# Patient Record
Sex: Male | Born: 1935 | Race: White | Hispanic: No | Marital: Married | Smoking: Current every day smoker
Health system: Southern US, Community
[De-identification: ages and names within clinical notes are randomized; demographics above are authoritative.]

## PROBLEM LIST (undated history)

## (undated) ENCOUNTER — Inpatient Hospital Stay: Admission: EM | Payer: Self-pay | Source: Home / Self Care

## (undated) DIAGNOSIS — E785 Hyperlipidemia, unspecified: Secondary | ICD-10-CM

## (undated) DIAGNOSIS — Z930 Tracheostomy status: Secondary | ICD-10-CM

## (undated) DIAGNOSIS — R0602 Shortness of breath: Secondary | ICD-10-CM

## (undated) DIAGNOSIS — I509 Heart failure, unspecified: Secondary | ICD-10-CM

## (undated) DIAGNOSIS — C799 Secondary malignant neoplasm of unspecified site: Secondary | ICD-10-CM

## (undated) DIAGNOSIS — J189 Pneumonia, unspecified organism: Secondary | ICD-10-CM

## (undated) DIAGNOSIS — G4700 Insomnia, unspecified: Secondary | ICD-10-CM

## (undated) DIAGNOSIS — K259 Gastric ulcer, unspecified as acute or chronic, without hemorrhage or perforation: Secondary | ICD-10-CM

## (undated) DIAGNOSIS — N529 Male erectile dysfunction, unspecified: Secondary | ICD-10-CM

## (undated) DIAGNOSIS — C3490 Malignant neoplasm of unspecified part of unspecified bronchus or lung: Secondary | ICD-10-CM

## (undated) DIAGNOSIS — N400 Enlarged prostate without lower urinary tract symptoms: Secondary | ICD-10-CM

## (undated) DIAGNOSIS — R3915 Urgency of urination: Secondary | ICD-10-CM

## (undated) DIAGNOSIS — Z9289 Personal history of other medical treatment: Secondary | ICD-10-CM

## (undated) DIAGNOSIS — C801 Malignant (primary) neoplasm, unspecified: Secondary | ICD-10-CM

## (undated) DIAGNOSIS — K429 Umbilical hernia without obstruction or gangrene: Secondary | ICD-10-CM

## (undated) DIAGNOSIS — C7951 Secondary malignant neoplasm of bone: Secondary | ICD-10-CM

## (undated) DIAGNOSIS — I499 Cardiac arrhythmia, unspecified: Secondary | ICD-10-CM

## (undated) HISTORY — DX: Secondary malignant neoplasm of bone: C79.51

## (undated) HISTORY — PX: COLONOSCOPY: SHX174

## (undated) HISTORY — DX: Male erectile dysfunction, unspecified: N52.9

## (undated) HISTORY — DX: Secondary malignant neoplasm of unspecified site: C79.9

## (undated) HISTORY — DX: Insomnia, unspecified: G47.00

## (undated) HISTORY — DX: Hyperlipidemia, unspecified: E78.5

## (undated) HISTORY — DX: Heart failure, unspecified: I50.9

## (undated) HISTORY — PX: ESOPHAGOGASTRODUODENOSCOPY: SHX1529

## (undated) HISTORY — PX: OTHER SURGICAL HISTORY: SHX169

## (undated) HISTORY — PX: BACK SURGERY: SHX140

## (undated) HISTORY — DX: Malignant (primary) neoplasm, unspecified: C80.1

## (undated) HISTORY — DX: Pneumonia, unspecified organism: J18.9

## (undated) HISTORY — DX: Malignant neoplasm of unspecified part of unspecified bronchus or lung: C34.90

---

## 2006-07-06 ENCOUNTER — Emergency Department (HOSPITAL_COMMUNITY): Admission: EM | Admit: 2006-07-06 | Discharge: 2006-07-06 | Payer: Self-pay | Admitting: Emergency Medicine

## 2008-01-18 DIAGNOSIS — C3490 Malignant neoplasm of unspecified part of unspecified bronchus or lung: Secondary | ICD-10-CM

## 2008-01-18 DIAGNOSIS — C801 Malignant (primary) neoplasm, unspecified: Secondary | ICD-10-CM

## 2008-01-18 HISTORY — DX: Malignant (primary) neoplasm, unspecified: C80.1

## 2008-01-18 HISTORY — PX: TRACHEOSTOMY: SUR1362

## 2008-01-18 HISTORY — PX: LUNG REMOVAL, PARTIAL: SHX233

## 2008-01-18 HISTORY — DX: Malignant neoplasm of unspecified part of unspecified bronchus or lung: C34.90

## 2008-02-20 ENCOUNTER — Ambulatory Visit (HOSPITAL_COMMUNITY): Admission: RE | Admit: 2008-02-20 | Discharge: 2008-02-20 | Payer: Self-pay | Admitting: Family Medicine

## 2008-02-21 ENCOUNTER — Ambulatory Visit (HOSPITAL_COMMUNITY): Admission: RE | Admit: 2008-02-21 | Discharge: 2008-02-21 | Payer: Self-pay | Admitting: Family Medicine

## 2008-02-28 ENCOUNTER — Ambulatory Visit: Payer: Self-pay | Admitting: Cardiothoracic Surgery

## 2008-02-28 ENCOUNTER — Ambulatory Visit (HOSPITAL_COMMUNITY)
Admission: RE | Admit: 2008-02-28 | Discharge: 2008-02-28 | Payer: Self-pay | Admitting: Thoracic Surgery (Cardiothoracic Vascular Surgery)

## 2008-02-28 ENCOUNTER — Ambulatory Visit: Payer: Self-pay | Admitting: Internal Medicine

## 2008-02-28 DIAGNOSIS — H9209 Otalgia, unspecified ear: Secondary | ICD-10-CM | POA: Insufficient documentation

## 2008-02-28 DIAGNOSIS — M542 Cervicalgia: Secondary | ICD-10-CM | POA: Insufficient documentation

## 2008-02-28 DIAGNOSIS — R0982 Postnasal drip: Secondary | ICD-10-CM | POA: Insufficient documentation

## 2008-02-28 DIAGNOSIS — R069 Unspecified abnormalities of breathing: Secondary | ICD-10-CM | POA: Insufficient documentation

## 2008-02-28 DIAGNOSIS — F17200 Nicotine dependence, unspecified, uncomplicated: Secondary | ICD-10-CM | POA: Insufficient documentation

## 2008-02-28 DIAGNOSIS — J449 Chronic obstructive pulmonary disease, unspecified: Secondary | ICD-10-CM

## 2008-02-28 DIAGNOSIS — J4489 Other specified chronic obstructive pulmonary disease: Secondary | ICD-10-CM | POA: Insufficient documentation

## 2008-03-04 ENCOUNTER — Ambulatory Visit (HOSPITAL_COMMUNITY): Admission: RE | Admit: 2008-03-04 | Discharge: 2008-03-04 | Payer: Self-pay | Admitting: Cardiothoracic Surgery

## 2008-03-07 ENCOUNTER — Ambulatory Visit: Payer: Self-pay | Admitting: Cardiothoracic Surgery

## 2008-03-17 ENCOUNTER — Encounter (INDEPENDENT_AMBULATORY_CARE_PROVIDER_SITE_OTHER): Payer: Self-pay | Admitting: Otolaryngology

## 2008-03-17 ENCOUNTER — Inpatient Hospital Stay (HOSPITAL_COMMUNITY): Admission: RE | Admit: 2008-03-17 | Discharge: 2008-03-25 | Payer: Self-pay | Admitting: Cardiothoracic Surgery

## 2008-03-17 ENCOUNTER — Ambulatory Visit: Payer: Self-pay | Admitting: Cardiothoracic Surgery

## 2008-03-27 ENCOUNTER — Ambulatory Visit: Admission: RE | Admit: 2008-03-27 | Discharge: 2008-05-07 | Payer: Self-pay | Admitting: Radiation Oncology

## 2008-04-04 ENCOUNTER — Encounter: Admission: RE | Admit: 2008-04-04 | Discharge: 2008-04-04 | Payer: Self-pay | Admitting: Cardiothoracic Surgery

## 2008-04-04 ENCOUNTER — Ambulatory Visit: Payer: Self-pay | Admitting: Cardiothoracic Surgery

## 2008-05-06 ENCOUNTER — Inpatient Hospital Stay (HOSPITAL_COMMUNITY): Admission: RE | Admit: 2008-05-06 | Discharge: 2008-05-09 | Payer: Self-pay | Admitting: Otolaryngology

## 2008-05-06 ENCOUNTER — Encounter (INDEPENDENT_AMBULATORY_CARE_PROVIDER_SITE_OTHER): Payer: Self-pay | Admitting: Otolaryngology

## 2008-05-15 ENCOUNTER — Ambulatory Visit: Admission: RE | Admit: 2008-05-15 | Discharge: 2008-07-17 | Payer: Self-pay | Admitting: Radiation Oncology

## 2008-10-10 ENCOUNTER — Encounter: Admission: RE | Admit: 2008-10-10 | Discharge: 2008-10-10 | Payer: Self-pay | Admitting: Cardiothoracic Surgery

## 2008-10-10 ENCOUNTER — Ambulatory Visit: Payer: Self-pay | Admitting: Cardiothoracic Surgery

## 2008-12-18 ENCOUNTER — Ambulatory Visit (HOSPITAL_COMMUNITY): Admission: RE | Admit: 2008-12-18 | Discharge: 2008-12-18 | Payer: Self-pay | Admitting: Ophthalmology

## 2009-03-18 ENCOUNTER — Ambulatory Visit (HOSPITAL_COMMUNITY): Admission: RE | Admit: 2009-03-18 | Discharge: 2009-03-18 | Payer: Self-pay | Admitting: Family Medicine

## 2009-10-20 ENCOUNTER — Ambulatory Visit (HOSPITAL_COMMUNITY)
Admission: RE | Admit: 2009-10-20 | Discharge: 2009-10-20 | Payer: Self-pay | Source: Home / Self Care | Admitting: Radiation Oncology

## 2009-11-04 ENCOUNTER — Encounter (INDEPENDENT_AMBULATORY_CARE_PROVIDER_SITE_OTHER): Payer: Self-pay | Admitting: Neurosurgery

## 2009-11-04 ENCOUNTER — Inpatient Hospital Stay (HOSPITAL_COMMUNITY)
Admission: RE | Admit: 2009-11-04 | Discharge: 2009-11-05 | Payer: Self-pay | Source: Home / Self Care | Admitting: Neurosurgery

## 2010-01-28 ENCOUNTER — Encounter (INDEPENDENT_AMBULATORY_CARE_PROVIDER_SITE_OTHER): Payer: Self-pay

## 2010-02-18 NOTE — Letter (Signed)
Summary: Recall, Screening Colonoscopy Only  [facility]  [address]   [place], [place] [address]   Phone: [phone]  Fax: [phone]    January 28, 2010  CHRISTINA WALDROP [address] [place], [place]  [address] 1933-05-27   Dear Mr. Childress,   Our records indicate it is time to schedule your colonoscopy.    Please call our office at [phone] and ask for the nurse.   Thank you,  Hendricks Limes, LPN Cloria Spring, LPN  [facility] Ph: [phone]   Fax: [phone]

## 2010-03-31 LAB — CBC
MCH: 30.7 pg (ref 26.0–34.0)
MCV: 91.2 fL (ref 78.0–100.0)
Platelets: 203 10*3/uL (ref 150–400)
RDW: 14.6 % (ref 11.5–15.5)

## 2010-04-21 LAB — BASIC METABOLIC PANEL
BUN: 13 mg/dL (ref 6–23)
CO2: 33 mEq/L — ABNORMAL HIGH (ref 19–32)
Chloride: 103 mEq/L (ref 96–112)
Creatinine, Ser: 0.87 mg/dL (ref 0.4–1.5)

## 2010-04-28 LAB — COMPREHENSIVE METABOLIC PANEL
AST: 14 U/L (ref 0–37)
Albumin: 3.9 g/dL (ref 3.5–5.2)
Chloride: 105 mEq/L (ref 96–112)
Creatinine, Ser: 0.85 mg/dL (ref 0.4–1.5)
GFR calc Af Amer: >60 mL/min (ref >60)
Potassium: 4.4 mEq/L (ref 3.5–5.1)
Total Bilirubin: 0.4 mg/dL (ref 0.3–1.2)
Total Protein: 6.7 g/dL (ref 6.0–8.3)

## 2010-04-28 LAB — URINALYSIS, ROUTINE W REFLEX MICROSCOPIC
Bilirubin Urine: NEGATIVE
Nitrite: NEGATIVE
Specific Gravity, Urine: 1.013 (ref 1.005–1.030)
pH: 6.5 (ref 5.0–8.0)

## 2010-04-28 LAB — GLUCOSE, CAPILLARY: Glucose-Capillary: 107 mg/dL — ABNORMAL HIGH (ref 70–99)

## 2010-04-28 LAB — CBC
MCV: 90.9 fL (ref 78.0–100.0)
Platelets: 222 10*3/uL (ref 150–400)
WBC: 4.2 10*3/uL (ref 4.0–10.5)

## 2010-04-28 LAB — APTT: aPTT: 33 seconds (ref 24–37)

## 2010-04-28 LAB — TYPE AND SCREEN

## 2010-04-29 LAB — BASIC METABOLIC PANEL
BUN: 14 mg/dL (ref 6–23)
BUN: 23 mg/dL (ref 6–23)
BUN: 9 mg/dL (ref 6–23)
CO2: 28 mEq/L (ref 19–32)
CO2: 28 mEq/L (ref 19–32)
CO2: 29 mEq/L (ref 19–32)
Calcium: 7.8 mg/dL — ABNORMAL LOW (ref 8.4–10.5)
Calcium: 8.3 mg/dL — ABNORMAL LOW (ref 8.4–10.5)
Calcium: 8.8 mg/dL (ref 8.4–10.5)
Chloride: 101 mEq/L (ref 96–112)
Chloride: 91 mEq/L — ABNORMAL LOW (ref 96–112)
Chloride: 96 mEq/L (ref 96–112)
Creatinine, Ser: 0.74 mg/dL (ref 0.4–1.5)
Creatinine, Ser: 0.82 mg/dL (ref 0.4–1.5)
Creatinine, Ser: 0.83 mg/dL (ref 0.4–1.5)
GFR calc Af Amer: >60 mL/min (ref >60)
GFR calc Af Amer: >60 mL/min (ref >60)
GFR calc Af Amer: >60 mL/min (ref >60)
GFR calc non Af Amer: >60 mL/min (ref >60)
GFR calc non Af Amer: >60 mL/min (ref >60)
GFR calc non Af Amer: >60 mL/min (ref >60)
Glucose, Bld: 115 mg/dL — ABNORMAL HIGH (ref 70–99)
Glucose, Bld: 121 mg/dL — ABNORMAL HIGH (ref 70–99)
Glucose, Bld: 136 mg/dL — ABNORMAL HIGH (ref 70–99)
Potassium: 4.3 mEq/L (ref 3.5–5.1)
Potassium: 4.3 mEq/L (ref 3.5–5.1)
Potassium: 4.5 mEq/L (ref 3.5–5.1)
Sodium: 130 mEq/L — ABNORMAL LOW (ref 135–145)
Sodium: 133 mEq/L — ABNORMAL LOW (ref 135–145)
Sodium: 134 mEq/L — ABNORMAL LOW (ref 135–145)

## 2010-04-29 LAB — COMPREHENSIVE METABOLIC PANEL
ALT: 12 U/L (ref 0–53)
AST: 20 U/L (ref 0–37)
Albumin: 2.7 g/dL — ABNORMAL LOW (ref 3.5–5.2)
Alkaline Phosphatase: 40 U/L (ref 39–117)
BUN: 9 mg/dL (ref 6–23)
CO2: 28 mEq/L (ref 19–32)
Calcium: 8 mg/dL — ABNORMAL LOW (ref 8.4–10.5)
Chloride: 94 mEq/L — ABNORMAL LOW (ref 96–112)
Creatinine, Ser: 0.72 mg/dL (ref 0.4–1.5)
GFR calc Af Amer: >60 mL/min (ref >60)
GFR calc non Af Amer: >60 mL/min (ref >60)
Glucose, Bld: 126 mg/dL — ABNORMAL HIGH (ref 70–99)
Potassium: 4.5 mEq/L (ref 3.5–5.1)
Sodium: 128 mEq/L — ABNORMAL LOW (ref 135–145)
Total Bilirubin: 0.6 mg/dL (ref 0.3–1.2)
Total Protein: 5.4 g/dL — ABNORMAL LOW (ref 6.0–8.3)

## 2010-04-29 LAB — POCT I-STAT, CHEM 8
BUN: 12 mg/dL (ref 6–23)
Calcium, Ion: 1.19 mmol/L (ref 1.12–1.32)
Chloride: 103 mEq/L (ref 96–112)
Creatinine, Ser: 0.8 mg/dL (ref 0.4–1.5)
Glucose, Bld: 140 mg/dL — ABNORMAL HIGH (ref 70–99)
HCT: 36 % — ABNORMAL LOW (ref 39.0–52.0)
Hemoglobin: 12.2 g/dL — ABNORMAL LOW (ref 13.0–17.0)
Potassium: 4.4 mEq/L (ref 3.5–5.1)
Sodium: 138 mEq/L (ref 135–145)
TCO2: 26 mmol/L (ref 0–100)

## 2010-04-29 LAB — CBC
HCT: 31 % — ABNORMAL LOW (ref 39.0–52.0)
HCT: 33.1 % — ABNORMAL LOW (ref 39.0–52.0)
Hemoglobin: 10.9 g/dL — ABNORMAL LOW (ref 13.0–17.0)
Hemoglobin: 11.8 g/dL — ABNORMAL LOW (ref 13.0–17.0)
MCHC: 35.2 g/dL (ref 30.0–36.0)
MCHC: 35.6 g/dL (ref 30.0–36.0)
MCV: 91.6 fL (ref 78.0–100.0)
MCV: 92.8 fL (ref 78.0–100.0)
Platelets: 170 10*3/uL (ref 150–400)
Platelets: 173 10*3/uL (ref 150–400)
RBC: 3.34 MIL/uL — ABNORMAL LOW (ref 4.22–5.81)
RBC: 3.61 MIL/uL — ABNORMAL LOW (ref 4.22–5.81)
RDW: 13.8 % (ref 11.5–15.5)
RDW: 13.8 % (ref 11.5–15.5)
WBC: 6 10*3/uL (ref 4.0–10.5)
WBC: 9 10*3/uL (ref 4.0–10.5)

## 2010-04-29 LAB — POCT I-STAT 3, ART BLOOD GAS (G3+)
Acid-Base Excess: 1 mmol/L (ref 0.0–2.0)
Acid-Base Excess: 3 mmol/L — ABNORMAL HIGH (ref 0.0–2.0)
Bicarbonate: 27.2 mEq/L — ABNORMAL HIGH (ref 20.0–24.0)
Bicarbonate: 28.6 mEq/L — ABNORMAL HIGH (ref 20.0–24.0)
O2 Saturation: 95 %
O2 Saturation: 97 %
Patient temperature: 97.9
Patient temperature: 98.7
TCO2: 29 mmol/L (ref 0–100)
TCO2: 30 mmol/L (ref 0–100)
pCO2 arterial: 46.9 mmHg — ABNORMAL HIGH (ref 35.0–45.0)
pCO2 arterial: 48.7 mmHg — ABNORMAL HIGH (ref 35.0–45.0)
pH, Arterial: 7.353 (ref 7.350–7.450)
pH, Arterial: 7.393 (ref 7.350–7.450)
pO2, Arterial: 78 mmHg — ABNORMAL LOW (ref 80.0–100.0)
pO2, Arterial: 94 mmHg (ref 80.0–100.0)

## 2010-04-29 LAB — BLOOD GAS, ARTERIAL
Acid-base deficit: 1.3 mmol/L (ref 0.0–2.0)
Bicarbonate: 24.5 mEq/L — ABNORMAL HIGH (ref 20.0–24.0)
O2 Content: 4 L/min
O2 Saturation: 94.4 %
Patient temperature: 98.6
TCO2: 26.1 mmol/L (ref 0–100)
pCO2 arterial: 53.3 mmHg — ABNORMAL HIGH (ref 35.0–45.0)
pH, Arterial: 7.285 — ABNORMAL LOW (ref 7.350–7.450)
pO2, Arterial: 77.6 mmHg — ABNORMAL LOW (ref 80.0–100.0)

## 2010-04-29 LAB — PREPARE PLATELETS

## 2010-04-29 LAB — GLUCOSE, CAPILLARY
Glucose-Capillary: 110 mg/dL — ABNORMAL HIGH (ref 70–99)
Glucose-Capillary: 112 mg/dL — ABNORMAL HIGH (ref 70–99)
Glucose-Capillary: 115 mg/dL — ABNORMAL HIGH (ref 70–99)

## 2010-05-04 LAB — URINALYSIS, ROUTINE W REFLEX MICROSCOPIC
Bilirubin Urine: NEGATIVE
Glucose, UA: NEGATIVE mg/dL
Hgb urine dipstick: NEGATIVE
Ketones, ur: NEGATIVE mg/dL
Nitrite: NEGATIVE
Protein, ur: NEGATIVE mg/dL
Specific Gravity, Urine: 1.022 (ref 1.005–1.030)
Urobilinogen, UA: 1 mg/dL (ref 0.0–1.0)
pH: 6 (ref 5.0–8.0)

## 2010-05-04 LAB — BLOOD GAS, ARTERIAL
Acid-Base Excess: 1.3 mmol/L (ref 0.0–2.0)
Bicarbonate: 25.3 mEq/L — ABNORMAL HIGH (ref 20.0–24.0)
Drawn by: 18161
FIO2: 0.21 %
O2 Saturation: 97.6 %
Patient temperature: 98.6
TCO2: 26.6 mmol/L (ref 0–100)
pCO2 arterial: 39.8 mmHg (ref 35.0–45.0)
pH, Arterial: 7.42 (ref 7.350–7.450)
pO2, Arterial: 92.4 mmHg (ref 80.0–100.0)

## 2010-05-04 LAB — CBC
HCT: 41.4 % (ref 39.0–52.0)
Hemoglobin: 14.4 g/dL (ref 13.0–17.0)
MCHC: 34.8 g/dL (ref 30.0–36.0)
MCV: 93.8 fL (ref 78.0–100.0)
Platelets: 189 10*3/uL (ref 150–400)
RBC: 4.41 MIL/uL (ref 4.22–5.81)
RDW: 13.4 % (ref 11.5–15.5)
WBC: 5.1 10*3/uL (ref 4.0–10.5)

## 2010-05-04 LAB — COMPREHENSIVE METABOLIC PANEL
ALT: 13 U/L (ref 0–53)
AST: 20 U/L (ref 0–37)
Albumin: 3.8 g/dL (ref 3.5–5.2)
Alkaline Phosphatase: 67 U/L (ref 39–117)
BUN: 14 mg/dL (ref 6–23)
CO2: 24 mEq/L (ref 19–32)
Calcium: 9.1 mg/dL (ref 8.4–10.5)
Chloride: 105 mEq/L (ref 96–112)
Creatinine, Ser: 0.87 mg/dL (ref 0.4–1.5)
GFR calc Af Amer: >60 mL/min (ref >60)
GFR calc non Af Amer: >60 mL/min (ref >60)
Glucose, Bld: 135 mg/dL — ABNORMAL HIGH (ref 70–99)
Potassium: 4.1 mEq/L (ref 3.5–5.1)
Sodium: 135 mEq/L (ref 135–145)
Total Bilirubin: 0.3 mg/dL (ref 0.3–1.2)
Total Protein: 6.5 g/dL (ref 6.0–8.3)

## 2010-05-04 LAB — TYPE AND SCREEN
ABO/RH(D): AB POS
Antibody Screen: NEGATIVE

## 2010-05-04 LAB — PROTIME-INR
INR: 0.9 (ref 0.00–1.49)
Prothrombin Time: 12.5 seconds (ref 11.6–15.2)

## 2010-05-04 LAB — APTT: aPTT: 32 seconds (ref 24–37)

## 2010-05-04 LAB — ABO/RH: ABO/RH(D): AB POS

## 2010-06-01 NOTE — Consult Note (Signed)
NEW PATIENT CONSULTATION   Jeffrey [name], Jeffrey [name]  DOB:  06/14/1933                                        February 28, 2008  CHART #:  [id number]   REASON FOR CONSULTATION:  A 4-cm cavitary left upper lobe mass.   CURRENT PROBLEMS:  1. Sinusitis with nasal drainage, left ear pain, and increasing      hoarseness.  2. Cavitary lesion in left upper lobe measuring 4 cm and      hypermetabolic activity on PET scan.   PRESENT ILLNESS:  I was asked to evaluate this 77 year old Caucasian  male smoker for recently diagnosed large cavitary mass in left upper  lung.  The patient has had persistent symptoms of an upper respiratory  infection with sinusitis - nasal drainage and some left ear discomfort.  He has had no purulent cough, hemoptysis, chest pain, weight loss, or  fever.  He smokes at least 1 pack of cigarettes a day.  He was treated  with a course of oral antibiotics and Depo-Medrol by Dr. Gerda Diss with  some improvement, but a chest x-ray was performed, which showed an  abnormality in left upper lobe.  A subsequent CT scan and then PET scan  were performed.  The CT scan showed a 4-cm cavitary lesion in the left  upper lobe with an enlarged AP window lymph node measuring 1.9 cm.  The  PET scan showed the cavitary lesion having intense hypermetabolic  activity.  However, the AP window lymph node was only baseline in  activity.  The patient presents to the [facility] today for evaluation.  An ENT consultation with Dr. Osborn Coho has been also scheduled,  but this is now to be rescheduled in the near future.   PAST MEDICAL HISTORY:  1. COPD with chronic cigarette abuse.  2. No known drug allergies.  3. 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  4. BPH with prostate biopsies negative for cancer.   HOME MEDICATIONS:  Advil and Ativan nightly.   SOCIAL HISTORY:  He is a retired Chartered certified accountant.  He smokes at least a pack a  day.  He does not drink alcohol.   He is married and lives with his wife.   FAMILY HISTORY:  Negative for lung cancer, coronary artery disease, or  MI.   REVIEW OF SYSTEMS:  Vital Signs:  He denies weight loss or fever.  HEENT:  Review is significant for his hoarseness, nasal drainage, sore  throat, and left ear discomfort.  He has total upper and lower dental  plates.  Thoracic:  Review is negative for history of prior abnormal  chest x-ray or thoracic trauma.  Cardiac:  Review is negative for  angina, arrhythmia, or murmur on prior stress test.  GI:  Review is  negative for hepatitis, jaundice, or blood per rectum.  Urologic:  Review is positive for BPH and negative for kidney stones, hematuria.  Endocrine:  Review is negative for diabetes or thyroid disease.  Vascular:  Review is negative for DVT, claudication, or TIA.  Neurologic:  Review is negative for stroke or seizure.   PHYSICAL EXAMINATION:  VITAL SIGNS:  The patient is 5 feet 8 inches and  weighs 155 pounds.  Blood pressure is 155/90, pulse 80, respirations 18,  and saturation 95% on room air.  GENERAL:  He is a very fit-appearing 77 year old male, accompanied by  his wife.  HEENT:  Some redness of the pharynx.  He is totally edentulous.  NECK:  Without mass, bruit, or JVD.  LYMPHATICS:  No palpable supraclavicular, cervical, or axillary  adenopathy.  LUNGS:  Breath sounds are clear.  CARDIAC:  Regular rhythm without S3 gallop, murmur or rub.  ABDOMEN:  Soft, nontender.  He has an umbilical hernia, which is easily  reduced.  EXTREMITIES:  Mild clubbing.  No cyanosis or edema.  Peripheral pulses  are intact.  NEUROLOGIC:  Without focal motor deficit and he is alert and oriented  x3.   LABORATORY DATA:  Reviewed his CT-PET scans.  He has a 4-cm cavitary  lesion in left upper lobe suspicious for squamous cell carcinoma.  The  AP window lymph node is 1.9 cm, but does not have significant activity  in PET scan.   Pulmonary function test today indicates  his FEV-1 is 2.6 with an FVC of  4.6.   IMPRESSION AND PLAN:  This is a 77 year old extremely functional male  who has a probable squamous cell carcinoma of the left lung with  cavitation and due to the size of lesion, I do not think that a biopsy  sampling the portion of it would be indicated and the best treatment be  to remove the cavitary lesion.  Prior to surgery, he will need an ENT  evaluation due to his progressive hoarseness.  He will also obtain a MRI  of the head.  He needs to stop smoking before  he undergoes left thoracic surgery and he will work on this as well.  The patient will be rescheduled to see Dr. Narda Bonds and then return  here for scheduling surgery.   Kerin Perna, M.D.  Electronically Signed   PV/MEDQ  D:  02/28/2008  T:  02/29/2008  Job:  [id number]   cc:   Donna Bernard, M.D.

## 2010-06-01 NOTE — Assessment & Plan Note (Signed)
OFFICE VISIT   Jeffrey [name], Jeffrey [name]  DOB:  01-16-1934                                        March 07, 2008  CHART #:  [id number]   CURRENT PROBLEMS:  1. A 4-cm cavitary lesion in the left upper lobe suspicious for      squamous cell carcinoma, hypermetabolic activity on PET scan.  2. Chronic obstructive pulmonary disease with active smoking, FEV-1      2.6.  3. Mole on left eye from a traumatic injury at age 77.  4. Supraglottic mass on the left side of his larynx consistent with      squamous cell carcinoma and associated with hoarseness.   PRESENT ILLNESS:  The patient is a 77 year old white smoker, who is  being evaluated for thoracotomy and resection of a newly diagnosed  cavitary lesion in his left upper lobe.  He also has had simultaneous  onset of hoarseness and was examined by Dr. Narda Bonds, who found a  supraglottic mass.  The PET scan shows no other areas of hypermetabolic  activity.  The CT scan does show some lymph nodes in the AP window;  however, they do not have increased activity on the PET scan.  MRI of  the brain was performed, which shows no evidence of metastatic disease.  The patient is in the process of trying to cut down on the cigarette  smoking and a combined direct laryngoscopy and biopsy by Dr. Ezzard Standing  followed by left VATS, lobectomy, and lymph node dissection is planned  for March 17, 2008.   PHYSICAL EXAMINATION:  VITAL SIGNS:  Blood pressure 150/90, pulse 80,  respirations 18, and saturation 95%.  GENERAL:  The patient is alert and pleasant, but anxious.  HEENT:  Negative.  LUNGS:  Breath sounds are distant, but clear.  CARDIAC:  Rhythm is regular.  EXTREMITIES:  He has no peripheral edema.   We will plan on admitting the patient for combined left VATS, pulmonary  dissection, and probable lobectomy with lymph node dissection with  simultaneous direct laryngoscopy and biopsy by Dr. Narda Bonds on March 17, 2008.  I  have discussed the indications and details of left VATS and  pulmonary resection including the use of general anesthesia, location of  the surgical incision, and expected postoperative recovery.  I discussed  the 5% risk of major mortality/morbidity to include bleeding, pneumonia,  MI, and ventilator dependence.  The patient understands these issues,  will continue to cut down on his cigarette smoking and proceed with  surgery on March 17, 2008.   Kerin Perna, M.D.  Electronically Signed   PV/MEDQ  D:  03/07/2008  T:  03/08/2008  Job:  [id number]   cc:   Donna Bernard, M.D.

## 2010-06-01 NOTE — Discharge Summary (Signed)
Jeffrey [name], Jeffrey [name] NO.:  [id number]   MEDICAL RECORD NO.:  [id number]          PATIENT TYPE:  INP   LOCATION:  3316                         FACILITY:  [facility]   PHYSICIAN:  Kerin Perna, M.D.  DATE OF BIRTH:  07-16-1933   DATE OF ADMISSION:  03/17/2008  DATE OF DISCHARGE:                               DISCHARGE SUMMARY   PRIMARY ADMITTING DIAGNOSES:  1. Left upper lobe lung lesion.  2. Supraglottic left laryngeal mass.   ADDITIONAL/DISCHARGE DIAGNOSES:  1. 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  2. 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  3. History of chronic obstructive pulmonary disease.  4. Blindness in left eye, status post trauma as a child.  5. History of benign prostatic hypertrophy with prostate biopsies      negative for carcinoma.  6. Ongoing tobacco abuse.   PROCEDURES PERFORMED:  1. Direct laryngoscopy with biopsy of left supraglottic lesion by Dr.      Dillard Cannon.  2. Left VATS, left upper lobectomy, and mediastinal lymph node      dissection by Dr. Kathlee Nations Trigt.   HISTORY:  The patient is a 77 year old male, who recently has had  persistent symptoms of an upper respiratory infection.  He was treated  with antibiotics and steroids by his primary care physician with some  improvement, but a chest x-ray showed an abnormality in the left upper  lobe.  His CT scan was subsequently performed which showed a 4 cm  cavitary lesion in the left upper lobe with an enlarged AP window lymph  node.  He underwent a PET scan, which showed increased activity in the  left upper lobe lesion and only baseline activity in AP-window node.  These findings were suspicious for squamous cell carcinoma.  He was  referred to the [facility] for  evaluation and was seen at that time by Dr. Kathlee Nations Trigt.  Dr. Donata Clay reviewed his films and felt that his best options for diagnosis  and  treatment would be to perform a left upper lobectomy at this time to  remove the cavitary lesion.  It was also determined that the patient has  had chronic and progressive hoarseness, and prior to surgery he was seen  by Dr. Dillard Cannon, who found a supraglottic mass upon exam in  the office.  This was also suspicious for carcinoma and after discussion  between the physicians, it was felt that the patient should undergo a  combined left VATS and direct laryngoscopy with biopsy.  All risks,  benefits, and alternatives of surgery were explained to the patient and  he agreed to proceed.   HOSPITAL COURSE:  The patient was admitted to [facility] on  March 17, 2008.  He was taken to the operating room where he underwent  the above described procedures.  Please see dictated operative notes for  complete details.  Intraoperative frozen sections were positive for non-  small cell carcinoma.  Final pathology was positive for invasive  squamous cell carcinoma stage T2, N0 of  the lung and T3-T4, N0 of the  larynx.  Postoperatively his course has been uneventful.  His chest  tubes have been removed in the usual fashion.  His pain has been well  controlled with p.o. pain medications.  He is tolerating a regular diet.  He is ambulating in the halls without problem.  He has been seen  postoperatively in consultation by Dr. Michell Heinrich in Radiation Oncology  and it was felt that the patient would most likely need a chemotherapy  and radiation.  A full outpatient evaluation has been scheduled.  Otherwise, he has been doing well from surgical standpoint.  His  incisions are all healing well.  His most recent labs have been stable  with sodium 130, potassium 4.3, BUN 14, and creatinine 0.83.  Hemoglobin  11.8, hematocrit 33.1, white count 9.0, and platelets 173.  Chest x-rays  have remained stable with no pneumothorax.  He will undergo a PA and  lateral chest x-ray on the morning of March 22, 2008.  He will also be  transferred to the floor for further rehabilitation.  It was felt that  if he continues to remain stable and progresses well, he will hopefully  be ready for discharge home within the next 48 hours.   DISCHARGE MEDICATIONS:  1. NicoDerm patch 21 mg daily.  2. Advair 250/50 one puff b.i.d.  3. Ultram 50-100 mg q.4 h. p.r.n. for pain.  4. Ambien 10 mg nightly.   DISCHARGE INSTRUCTIONS:  He is asked to refrain from driving, heavy  lifting, or strenuous activity.  He may continue ambulating daily and  using his incentive spirometer.  He may shower daily and clean his  incisions with soap and water.  He will continue same preoperative diet.  He is counseled regarding smoking cessation.   DISCHARGE FOLLOWUP:  He will see Dr. Donata Clay in the office on April 04, 2008, with a chest x-ray.  He also has an appointment to see Dr.  Michell Heinrich on March 28, 2008, at 3:00 p.m.  He will follow up as directed  with Dr. Ezzard Standing.  In the interim if he experiences any problems or has  questions, he is asked to contact our office immediately.      Coral Ceo, P.A.      Kerin Perna, M.D.  Electronically Signed    GC/MEDQ  D:  03/21/2008  T:  03/22/2008  Job:  [id number]   cc:   [facility] Office  Christopher E. Ezzard Standing, M.D.  Donna Bernard, M.D.  Lurline Hare, MD

## 2010-06-01 NOTE — Assessment & Plan Note (Signed)
OFFICE VISIT   Jeffrey [name], Jeffrey [name]  DOB:  01/07/1934                                        October 10, 2008  CHART #:  [id number]   CURRENT PROBLEMS:  1. Status post left thoracotomy, left upper lobectomy, and lymph node      dissection in March 2010 for a non-small cell carcinoma of the lung      stage IB.  2. Status post laryngectomy and radiation for a left supraglottic      squamous cell carcinoma in April 2010.   PRESENT ILLNESS:  The patient returns for a 89-month followup with CT  scan following left upper lobectomy for stage IB non-small cell  carcinoma.  He is recovering from his subsequent laryngectomy and  radiation therapy and is slowly regaining his weight loss.  He is not  smoking.  He does have some issues with airway secretions.  He also  complains of a dry mouth and altered taste.  He is slowly trying to gain  back his weight loss.   MEDICATIONS:  He is not taking any regular pain medication other than  hydrocodone sometimes in the evenings.   PHYSICAL EXAMINATION:  Vital Signs:  His blood pressure is 116/70, pulse  is 88, respirations are unlabored, and saturation is 96%.  Lungs:  Breath sounds are clear and equal.  He has a clean laryngectomy stoma,  and he has no palpable adenopathy in the neck.  Cardiac:  Rhythm is  regular.  His breath sounds are clear.   His 45-month CT scan of the chest shows COPD with no evidence of  recurrent cancer or new lesions.   PLAN:  The patient need a followup chest CT scan in 1 year following  surgery.  I will discuss timing of the scan with Dr. Michell Heinrich, who is  also performing surveillance scans to follow the  T4 laryngeal carcinoma status post resection and radiation therapy.  We  will coordinate the scan to avoid duplication and unnecessary radiation  exposure.   Kerin Perna, M.D.  Electronically Signed   PV/MEDQ  D:  10/10/2008  T:  10/11/2008  Job:  [id number]   cc:   Lurline Hare,  MD  Donna Bernard, M.D.

## 2010-06-01 NOTE — Assessment & Plan Note (Signed)
OFFICE VISIT   Jeffrey [name], Jeffrey [name]  DOB:  February 19, 1933                                        April 04, 2008  CHART #:  [id number]   CURRENT PROBLEMS:  1. Status post left thoracotomy and left upper lobectomy with lymph      node dissection on March 17, 2008, for non-small cell carcinoma of      the lung.  2. Pathologic T2a, N0, M0 non-small-cell carcinoma, stage IB.  3. Chronic obstructive pulmonary disease.  4. Left supraglottic squamous cell carcinoma, pending surgical      resection by Dr. Narda Bonds.  5. Traumatic loss of left eye at age 36.   PRESENT ILLNESS:  The patient returns for his first office visit after  left thoracotomy and left upper lobectomy with node dissection.  He is a  77 year old Caucasian male, ex-smoker, who presented with symptoms of  URI and was found to have both a 4-cm cavitary lesion in his left upper  lobe as well as a left supraglottic mass measuring approximately 1.5 cm.  The biopsy of the supraglottic mass was squamous cell carcinoma.  He  subsequently at that time underwent a left upper lobectomy and lymph  node dissection with a pathologic stage IB non-small cell carcinoma.  He  tolerated the procedure well.  He did have some atrial arrhythmias and  atrial fibrillation postoperatively, which converted to sinus rhythm on  oral beta-blocker and calcium-channel blocker.  Since returning home, he  has had no symptoms of palpitation or shortness of breath, productive  cough, fever and the surgical incision is healing well.  He is in the  process of being evaluated for treatment of his left supraglottic mass  and has an appointment to see Dr. Dillard Cannon next week as it  appears that the radiation therapy would not be adequate treatment for  this lesion.  He has not smoked since his hospital discharge and Ultram  is taking care of his incisional pain.   CURRENT MEDICATIONS:  1. Metoprolol 25 mg p.o. b.i.d.  2. Cardizem  CD 180 mg daily.  3. Ultram p.r.n. pain.  4. Ativan nightly p.r.n. insomnia.  5. Advair Diskus 1 p.o. b.i.d.   PHYSICAL EXAMINATION:  VITAL SIGNS:  Blood pressure 123/76, pulse 80,  respirations 18, and saturation 99%.  GENERAL:  He is alert and oriented.  LUNGS:  Breath sounds are clear, but slightly diminished at the left  base.  CHEST:  The left thoracotomy incision is well healed.  CARDIAC:  Rhythm is regular.  He is in a sinus rhythm.  NEUROLOGIC:  Intact.  EXTREMITIES:  There is no peripheral edema.   PA and lateral chest x-ray reveal clear left lower lobe, remaining with  some volume loss on the left side.  There is no evidence of pleural  effusion, pneumothorax, or parenchymal infiltrate.   IMPRESSION AND PLAN:  The patient has done well almost 3 weeks postop.  He knows he could resume driving, light activities, and not to do any  heavy yard work or lifting until after May 17, 2008.  With a stage IB non-  small carcinoma and a pending head-neck resection, we will plan on  careful followup and observation of his pulmonary status.  He will  return for a CT scan in 6 months' time.   Theron Arista  Donata Clay, M.D.  Electronically Signed   PV/MEDQ  D:  04/04/2008  T:  04/05/2008  Job:  [id number]   cc:   Lajuana Matte, MD  Kristine Garbe. Ezzard Standing, M.D.  Donna Bernard, M.D.

## 2010-06-01 NOTE — Op Note (Signed)
NAMEDAIVEON, MARKMAN NO.:  [id number]   MEDICAL RECORD NO.:  [id number]          PATIENT TYPE:  INP   LOCATION:  2303                         FACILITY:  [facility]   PHYSICIAN:  Kristine Garbe. Ezzard Standing, M.D.DATE OF BIRTH:  27-Feb-1933   DATE OF PROCEDURE:  03/17/2008  DATE OF DISCHARGE:                               OPERATIVE REPORT   PREOPERATIVE DIAGNOSIS:  Hoarseness with a left supraglottic lesion,  questionable cancer.   POSTOPERATIVE DIAGNOSIS:  Hoarseness with a left supraglottic lesion,  questionable cancer.   FINDINGS:  Consistent with probable carcinoma.   OPERATION PERFORMED:  Direct laryngoscopy and biopsy of left  supraglottic lesion.   SURGEON:  Kristine Garbe. Ezzard Standing, MD   ANESTHESIA:  General endotracheal.   COMPLICATIONS:  None.   BRIEF CLINICAL NOTE:  Jeffrey [name] is a 77 year old gentleman who has  had some old left ear discomfort actually for several months with  history of smoker, has persistent respiratory problems.  He had a CT  scan of his chest, which showed an abnormality in the left side of the  larynx as well as a lesion in the lung.  On exam in the office, the  patient has a supraglottic left-sided laryngeal mass consistent with  probable carcinoma.  He is taken to operating room at this time for  direct laryngoscopy and biopsy and Dr. Donata Clay, who subsequently  performed excision of the lung lesion following direct laryngoscopy and  biopsy.   DESCRIPTION OF PROCEDURE:  After endotracheal anesthesia, direct  laryngoscopy was performed.  The base of tongue, vallecula, and  tonsillar regions all appeared benign.  Both piriform sinuses were  clear.  On evaluation of the endolarynx, the patient had a lesion on the  left supraglottic area rising for approximately in the left false cord  extending superiorly up, just the laryngeal surface of the epiglottis  extended across the midline onto the right anterior surface of the  epiglottis of the right laryngeal surface of the epiglottis.  It  extended down to the true vocal cord on the left side.  Right vocal cord  was actually fairly clear.  Several biopsies were obtained.  Photos were  obtained and the procedure was completed.  After completion of direct  laryngoscopy and biopsy, Dr. Donata Clay proceeded with the lung  procedure.           ______________________________  Kristine Garbe. Ezzard Standing, M.D.    CEN/MEDQ  D:  03/17/2008  T:  03/17/2008  Job:  [id number]   cc:   Donna Bernard, M.D.  Kerin Perna, M.D.

## 2010-06-01 NOTE — Op Note (Signed)
NAMECHER, EGNOR NO.:  [id number]   MEDICAL RECORD NO.:  [id number]          PATIENT TYPE:  INP   LOCATION:  2103                         FACILITY:  [facility]   PHYSICIAN:  Kristine Garbe. Ezzard Standing, M.D.DATE OF BIRTH:  Apr 15, 1933   DATE OF PROCEDURE:  05/06/2008  DATE OF DISCHARGE:                               OPERATIVE REPORT   PREOPERATIVE DIAGNOSIS:  T4 laryngeal carcinoma.   POSTOPERATIVE DIAGNOSIS:  T4 laryngeal carcinoma.   OPERATION:  Total laryngectomy.   SURGEON:  Kristine Garbe. Ezzard Standing, MD   ASSISTANT SURGEON:  Hermelinda Medicus, MD   ANESTHESIA:  General endotracheal.   ESTIMATED BLOOD LOSS:  50-75 mL.   FLUIDS:  2500 mL.   COMPLICATIONS:  None.   BRIEF CLINICAL NOTE:  Jeffrey [name] is a 77 year old gentleman who was  recently diagnosed with lung cancer as well as the second primary  supraglottic laryngeal cancer.  He is status post lobectomy by Dr. Donata Clay and workup of his laryngeal cancer, this was squamous cell  carcinoma biopsy and on further evaluation appeared to have eroded  through the laryngeal cartilage into the strap muscles making this a  T4N0 squamous cell carcinoma of larynx and was felt that best treatment  of this point tumor board.   RECOMMENDATIONS:  Laryngectomy followed by postop radiation therapy.  The patient was taken to the operating room at this time for total  laryngectomy.   DESCRIPTION OF PROCEDURE:  After adequate endotracheal anesthesia, Foley  catheter was placed.  Neck was prepped with Betadine solution and dried  with sterile towels.  The proposed incision site just below the cricoid  cartilage was marked out.  Incision was made at the level of the cricoid  cartilage, then extended up on either side of the laryngeal cartilage.  Subplatysmal flaps were elevated superiorly and inferiorly.  Dissection  was then carried out superiorly where the hyoid bone was identified and  the suprahyoid musculature was  transected with cautery.  Strap muscles  were left attached on the larynx as the CT scan and PET scan  demonstrated the cancer to be through the laryngeal cartilage toward the  strap muscles.  The inferior aspects of the strap muscles were  transected and the lateral edges of the laryngeal cartilage were  identified and dissected out with cautery.  The superior laryngeal  vesicles were identified and were ligated with 2-0 silk sutures  bilaterally and divided.  After dissecting out the larynx and hyoid  bone, the hypopharyngeal area was entered through the mucosa on the  superior right side.  At just above the hyoid bone, the epiglottis was  identified and pulled inferiorly and the tumor was found and was thought  mostly supraglottic just above the left true vocal cord across the  midline onto the right side.  It did not really approach the AE fold,  but was contained within the endolarynx mostly on the left side.  A  horizontal incision across trachea just beneath the first tracheal ring  was angulated superiorly toward the cricoid cartilage.  Mucosal cuts  were then made with  the tumor under direct visualization in the piriform  regions bilaterally and extended down to the upper cervical esophagus.  Of note, prior to making the tracheal cut, a single pretracheal node was  identified and was sent as a separate specimen.  This basically  represented a delphian node.  The remaining larynx was removed and the  larynx was sent entirely as specimen to pathology.  Hemostasis was  obtained with a cautery.  A nasogastric tube was brought through the  nose and placed down to the stomach under direct visualization and this  was secured to the nose with a single 2-0 silk suture to the septum.  After obtaining adequate hemostasis, the hypopharyngeal mucosal defect  was closed with 2-0 chromic suture inverting the mucosal edges in a T  fashion.  A second layer of 2-0 chromic sutures were performed  closing  the musculature over the mucosal closure.  The trachea was secured to  the skin edges with 2-0 Vicryl sutures.  A #15 Hemovac drain was brought  out through a separate small stab incision on the left side, and the  skin was closed with 2-0 chromic sutures subcutaneously and 5-0 nylon to  reapproximate skin edges.  A #10 Shiley tube was placed and secured with  Velcro tape.  Bacitracin ointment was applied to the wounds and wounds  were dressed.  This completed procedure.  Berlyn was awoke from  anesthesia and transferred to recovery room and postoperatively doing  well.   DISPOSITION:  Lequan will be admitted to Surgical Intensive Care Unit.  He received perioperative antibiotics of Ancef and Cleocin.           ______________________________  Kristine Garbe. Ezzard Standing, M.D.     CEN/MEDQ  D:  05/06/2008  T:  05/06/2008  Job:  [id number]   cc:   Kerin Perna, M.D.  Donna Bernard, M.D.  Lurline Hare, MD  Hermelinda Medicus, M.D.

## 2010-06-01 NOTE — Discharge Summary (Signed)
NAME:  Jeffrey [name], Jeffrey [name]               ACCOUNT NO.:  [id number]   MEDICAL RECORD NO.:  [id number]          PATIENT TYPE:  INP   LOCATION:  5158                         FACILITY:  [facility]   PHYSICIAN:  Kristine Garbe. Ezzard Standing, M.D.DATE OF BIRTH:  1934/01/17   DATE OF ADMISSION:  05/06/2008  DATE OF DISCHARGE:  05/09/2008                               DISCHARGE SUMMARY   DIAGNOSIS THIS HOSPITALIZATION:  Stage IV laryngeal cancer.   OPERATION DURING THIS HOSPITALIZATION:  Total laryngectomy on May 06, 2008.   CONSULTATIONS:  Speech Pathology.   THE PATIENT'S HOSPITAL COURSE:  [name] is a 77 year old gentleman  who has stage IV laryngeal cancer and is admitted to the hospital at  this time for a total laryngectomy and will plan postoperative radiation  therapy.  The patient was admitted via the operating room on May 06, 2008, at which time, he underwent a total laryngectomy.  Postoperatively, the patient received perioperative antibiotics, Cleocin  and Ancef.  He was admitted to the intensive care unit.  He had a stable  course in the intensive care unit and was subsequently transferred to  the floor on his first postoperative day.  He was started on tube  feedings on his first postoperative day.  Consult was obtained with  Speech Pathology for assistance in alaryngeal speech.  Wounds were doing  well.  He tolerated nasogastric tube feedings well.  He was afebrile,  vital signs were stable, and the patient was subsequently discharged  home on his third postoperative day, May 09, 2008.  The patient was  seen by Speech Pathology prior to discharge.  The patient is discharged  home on May 09, 2008.   DISPOSITION:  The patient is discharged home, will follow up in my  office in 4 days for recheck and have his nasogastric tube removed.   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           ______________________________  Kristine Garbe Ezzard Standing,  M.D.     CEN/MEDQ  D:  07/03/2008  T:  07/04/2008  Job:  [id number]

## 2010-06-01 NOTE — Op Note (Signed)
NAMEYONI, LOBOS NO.:  [id number]   MEDICAL RECORD NO.:  [id number]          PATIENT TYPE:  INP   LOCATION:  2303                         FACILITY:  [facility]   PHYSICIAN:  Kerin Perna, M.D.  DATE OF BIRTH:  1933/09/28   DATE OF PROCEDURE:  03/17/2008  DATE OF DISCHARGE:                               OPERATIVE REPORT   OPERATION:  Left video-assisted thoracoscopic surgery with left upper  lobectomy and mediastinal lymph node dissection.   SURGEON:  Kerin Perna, MD   ASSISTANT:  Stephanie Acre Dominick, PA-C   PREOPERATIVE DIAGNOSES:  Cavitary lesion left upper lobe, non-small cell  carcinoma.   PREOPERATIVE DIAGNOSES:  Cavitary lesion left upper lobe, non-small cell  carcinoma.   ANESTHESIA:  General.   INDICATIONS:  The patient is a 77 year old smoker who presented with  hoarseness and an abnormal CT scan.  He had a 3-cm cavitary lesion in  the left upper lobe as well as a subglottic mass.  A PET scan showed  hypermetabolic activity of both areas without evidence of regional or  distant metastases and brain MRI was negative and the patient was felt  to be a candidate for direct laryngoscopy with biopsy and simultaneous  left VATS and probable lobectomy.  Dr. Dillard Cannon evaluated the  patient in his office and arranged for a laryngoscopy and biopsy of the  subglottic mass.  I examined the patient in the office on several  occasions and discussed the details indications and benefits of left  VATS and lobectomy for treatment of his probable non-small cell  carcinoma.  I also reviewed the risks of bleeding, prolonged air leak,  infection, ventilator dependence, MI, and death.  The patient did have a  preoperative stress myocardial scan, preop, which was low probability  for ischemia.  After reviewing the above issues, he demonstrated his  understanding and agreed to proceed with surgery under what I felt was  an informed consent.   PROCEDURE:   The patient was brought to the operating room, placed supine  on the operating table where general anesthesia was induced.  A direct  laryngoscopy and biopsy were first performed by Dr. Ezzard Standing which would  be dictated in a separate note.  The patient was then intubated with a  double-lumen tube and rolled to expose the left chest which was prepped  and draped as a sterile field.  A small incision in the fifth interspace  was made in the scope and camera was inserted.  The hemithorax was  examined and there was a area of scarring and puckering of the pleural  surface in the left upper lobe.  I did not see any other significant  abnormalities other than chronic smoking changes.   Next, a limited lateral thoracotomy in the fifth interspace was  performed and the fifth rib was divided posteriorly.  The lung was  mobilized and a firm mass in the left upper lobe was identified.  It was  large.  The lower lobe was inspected, examined, and palpated and there  were no mass was noted.  The inferior  ligament was mobilized to the  inferior vein.   First, a large lymph node in the AP window was dissected out and  submitted for frozen section.  This was negative for metastatic disease.  Therefore, we were proceeded with the left upper lobectomy.  The  pulmonary artery was dissected and the three major branches to the upper  lobe were encircled with vessel loops and visually ligated and divided  with the Endo-GIA vascular stapling device.  The pulmonary artery was  opened into the fissure and the branches to the lower lobe were  identified and protected.  The superior vein was then dissected out  anteriorly and encircled with a vessel loop.  A Endo-GIA vascular  stapler was then used to ligate and divide the superior pulmonary vein.  The interlobar fissure was then divided using the Endo-GIA stapler  leaving the left upper lobe bronchus.  The left upper lobe bronchus was  dissected carefully and the  TL stapling device with the large staple  load was used to staple off the left upper lobe bronchus after  confirming that the lower lobe bronchus was widely patent by gently  inflating the left lung.  The specimen was then removed and submitted  for pathology.  The frozen section returned, non-small cell carcinoma  consistent with squamous cell carcinoma and margins negative.  A large  N1 lymph node in the fissure was also submitted as a separate specimen  as well as other peribronchial lymph nodes in the specimen.  The hilum  was inspected and found to be hemostatic.  Biologic adhesive was used to  cover the staple lines in the fissure.  The bronchial stump was tested  under water and found to be airtight.   Two chest tubes were placed anteriorly and posteriorly in the pleural  space and brought out through separate incisions.  The left lung was  then inflated under direct vision and fill the hemithorax well.  Pericostal sutures of #2 Vicryl were placed to reapproximate the chest  wall.  The two muscle layers were closed with interrupted #1 Vicryl  sutures.  The subcutaneous layer was closed in running 2-0 Vicryl and  the skin was closed with a subcuticular.  An On-Q catheter was placed  and connected to the Marcaine reservoir and secured to the skin with  silk suture.  The patient was then extubated after the chest tubes have  been connected to an underwater seal Pleur-Evac drainage system.  He  returned to the recovery room in stable condition.      Kerin Perna, M.D.  Electronically Signed     PV/MEDQ  D:  03/17/2008  T:  03/18/2008  Job:  [id number]

## 2012-01-18 DIAGNOSIS — J189 Pneumonia, unspecified organism: Secondary | ICD-10-CM

## 2012-01-18 HISTORY — DX: Pneumonia, unspecified organism: J18.9

## 2012-01-24 ENCOUNTER — Inpatient Hospital Stay (HOSPITAL_COMMUNITY)
Admission: EM | Admit: 2012-01-24 | Discharge: 2012-01-27 | DRG: 193 | Disposition: A | Discharge status: Home Health | Payer: Managed Care, Other (non HMO) | Attending: Internal Medicine | Admitting: Internal Medicine

## 2012-01-24 ENCOUNTER — Encounter (HOSPITAL_COMMUNITY): Payer: Self-pay | Admitting: *Deleted

## 2012-01-24 ENCOUNTER — Emergency Department (HOSPITAL_COMMUNITY): Payer: Managed Care, Other (non HMO)

## 2012-01-24 DIAGNOSIS — Z79899 Other long term (current) drug therapy: Secondary | ICD-10-CM

## 2012-01-24 DIAGNOSIS — J4489 Other specified chronic obstructive pulmonary disease: Secondary | ICD-10-CM | POA: Diagnosis present

## 2012-01-24 DIAGNOSIS — I428 Other cardiomyopathies: Secondary | ICD-10-CM | POA: Diagnosis present

## 2012-01-24 DIAGNOSIS — I519 Heart disease, unspecified: Secondary | ICD-10-CM

## 2012-01-24 DIAGNOSIS — Z85118 Personal history of other malignant neoplasm of bronchus and lung: Secondary | ICD-10-CM

## 2012-01-24 DIAGNOSIS — F17200 Nicotine dependence, unspecified, uncomplicated: Secondary | ICD-10-CM | POA: Diagnosis present

## 2012-01-24 DIAGNOSIS — Z902 Acquired absence of lung [part of]: Secondary | ICD-10-CM

## 2012-01-24 DIAGNOSIS — J449 Chronic obstructive pulmonary disease, unspecified: Secondary | ICD-10-CM | POA: Diagnosis present

## 2012-01-24 DIAGNOSIS — N179 Acute kidney failure, unspecified: Secondary | ICD-10-CM | POA: Diagnosis present

## 2012-01-24 DIAGNOSIS — R7989 Other specified abnormal findings of blood chemistry: Secondary | ICD-10-CM | POA: Diagnosis present

## 2012-01-24 DIAGNOSIS — E860 Dehydration: Secondary | ICD-10-CM

## 2012-01-24 DIAGNOSIS — I509 Heart failure, unspecified: Secondary | ICD-10-CM | POA: Diagnosis present

## 2012-01-24 DIAGNOSIS — Z9089 Acquired absence of other organs: Secondary | ICD-10-CM

## 2012-01-24 DIAGNOSIS — N320 Bladder-neck obstruction: Secondary | ICD-10-CM | POA: Diagnosis present

## 2012-01-24 DIAGNOSIS — Z930 Tracheostomy status: Secondary | ICD-10-CM

## 2012-01-24 DIAGNOSIS — R918 Other nonspecific abnormal finding of lung field: Secondary | ICD-10-CM | POA: Diagnosis present

## 2012-01-24 DIAGNOSIS — Z923 Personal history of irradiation: Secondary | ICD-10-CM

## 2012-01-24 DIAGNOSIS — I5022 Chronic systolic (congestive) heart failure: Secondary | ICD-10-CM | POA: Diagnosis present

## 2012-01-24 DIAGNOSIS — I429 Cardiomyopathy, unspecified: Secondary | ICD-10-CM | POA: Diagnosis present

## 2012-01-24 DIAGNOSIS — J9600 Acute respiratory failure, unspecified whether with hypoxia or hypercapnia: Secondary | ICD-10-CM | POA: Diagnosis present

## 2012-01-24 DIAGNOSIS — Z8521 Personal history of malignant neoplasm of larynx: Secondary | ICD-10-CM

## 2012-01-24 DIAGNOSIS — N1330 Unspecified hydronephrosis: Secondary | ICD-10-CM | POA: Diagnosis present

## 2012-01-24 DIAGNOSIS — J189 Pneumonia, unspecified organism: Secondary | ICD-10-CM | POA: Diagnosis present

## 2012-01-24 HISTORY — DX: Tracheostomy status: Z93.0

## 2012-01-24 LAB — CBC WITH DIFFERENTIAL/PLATELET
Basophils Absolute: 0 10*3/uL (ref 0.0–0.1)
Eosinophils Relative: 0 % (ref 0–5)
HCT: 38.9 % — ABNORMAL LOW (ref 39.0–52.0)
Lymphocytes Relative: 7 % — ABNORMAL LOW (ref 12–46)
MCHC: 32.6 g/dL (ref 30.0–36.0)
MCV: 93.1 fL (ref 78.0–100.0)
Monocytes Absolute: 0.5 10*3/uL (ref 0.1–1.0)
RDW: 16.4 % — ABNORMAL HIGH (ref 11.5–15.5)
WBC: 9.1 10*3/uL (ref 4.0–10.5)

## 2012-01-24 LAB — COMPREHENSIVE METABOLIC PANEL
AST: 18 U/L (ref 0–37)
CO2: 27 mEq/L (ref 19–32)
Calcium: 9.9 mg/dL (ref 8.4–10.5)
Creatinine, Ser: 1.57 mg/dL — ABNORMAL HIGH (ref 0.50–1.35)
GFR calc Af Amer: 48 mL/min — ABNORMAL LOW (ref >90)
GFR calc non Af Amer: 41 mL/min — ABNORMAL LOW (ref >90)

## 2012-01-24 LAB — GLUCOSE, CAPILLARY

## 2012-01-24 LAB — INFLUENZA PANEL BY PCR (TYPE A & B)
H1N1 flu by pcr: NOT DETECTED
Influenza A By PCR: NEGATIVE
Influenza B By PCR: NEGATIVE

## 2012-01-24 LAB — URINALYSIS, ROUTINE W REFLEX MICROSCOPIC
Hgb urine dipstick: NEGATIVE
Leukocytes, UA: NEGATIVE
Nitrite: NEGATIVE
Specific Gravity, Urine: 1.015 (ref 1.005–1.030)
Urobilinogen, UA: 0.2 mg/dL (ref 0.0–1.0)

## 2012-01-24 LAB — URINE MICROSCOPIC-ADD ON

## 2012-01-24 LAB — LACTIC ACID, PLASMA: Lactic Acid, Venous: 1.2 mmol/L (ref 0.5–2.2)

## 2012-01-24 MED ORDER — IPRATROPIUM BROMIDE 0.02 % IN SOLN
0.5000 mg | Freq: Four times a day (QID) | RESPIRATORY_TRACT | Status: DC
  Administered 2012-01-24 – 2012-01-27 (×13): 0.5 mg via RESPIRATORY_TRACT
  Filled 2012-01-24 (×11): qty 2.5

## 2012-01-24 MED ORDER — GUAIFENESIN ER 600 MG PO TB12
1200.0000 mg | ORAL_TABLET | Freq: Two times a day (BID) | ORAL | Status: DC
  Administered 2012-01-25 (×2): 1200 mg via ORAL
  Filled 2012-01-24 (×5): qty 2

## 2012-01-24 MED ORDER — DEXTROSE 5 % IV SOLN
1.0000 g | Freq: Once | INTRAVENOUS | Status: AC
  Administered 2012-01-24: 1 g via INTRAVENOUS
  Filled 2012-01-24: qty 10

## 2012-01-24 MED ORDER — IPRATROPIUM BROMIDE 0.02 % IN SOLN
RESPIRATORY_TRACT | Status: AC
  Administered 2012-01-24: 0.5 mg via RESPIRATORY_TRACT
  Filled 2012-01-24: qty 2.5

## 2012-01-24 MED ORDER — DEXTROSE 5 % IV SOLN
500.0000 mg | INTRAVENOUS | Status: DC
  Administered 2012-01-25 – 2012-01-26 (×2): 500 mg via INTRAVENOUS
  Filled 2012-01-24 (×2): qty 500

## 2012-01-24 MED ORDER — ALBUTEROL SULFATE (5 MG/ML) 0.5% IN NEBU
2.5000 mg | INHALATION_SOLUTION | Freq: Four times a day (QID) | RESPIRATORY_TRACT | Status: DC
  Administered 2012-01-24 – 2012-01-27 (×12): 2.5 mg via RESPIRATORY_TRACT
  Filled 2012-01-24 (×10): qty 0.5

## 2012-01-24 MED ORDER — SODIUM CHLORIDE 0.9 % IV SOLN
INTRAVENOUS | Status: DC
  Administered 2012-01-24: 15:00:00 via INTRAVENOUS

## 2012-01-24 MED ORDER — DEXTROSE 5 % IV SOLN
500.0000 mg | Freq: Once | INTRAVENOUS | Status: AC
  Administered 2012-01-24: 500 mg via INTRAVENOUS
  Filled 2012-01-24: qty 500

## 2012-01-24 MED ORDER — DEXTROSE 5 % IV SOLN
1.0000 g | INTRAVENOUS | Status: DC
  Administered 2012-01-25 – 2012-01-26 (×2): 1 g via INTRAVENOUS
  Filled 2012-01-24 (×2): qty 10

## 2012-01-24 MED ORDER — ENOXAPARIN SODIUM 40 MG/0.4ML ~~LOC~~ SOLN
40.0000 mg | SUBCUTANEOUS | Status: DC
  Administered 2012-01-25: 40 mg via SUBCUTANEOUS
  Filled 2012-01-24 (×2): qty 0.4

## 2012-01-24 MED ORDER — ALBUTEROL SULFATE (5 MG/ML) 0.5% IN NEBU
INHALATION_SOLUTION | RESPIRATORY_TRACT | Status: AC
  Administered 2012-01-24: 2.5 mg via RESPIRATORY_TRACT
  Filled 2012-01-24: qty 0.5

## 2012-01-24 MED ORDER — ALBUTEROL SULFATE (5 MG/ML) 0.5% IN NEBU
5.0000 mg | INHALATION_SOLUTION | Freq: Once | RESPIRATORY_TRACT | Status: AC
  Administered 2012-01-24: 5 mg via RESPIRATORY_TRACT
  Filled 2012-01-24: qty 1

## 2012-01-24 MED ORDER — SODIUM CHLORIDE 0.9 % IV SOLN
INTRAVENOUS | Status: DC
  Administered 2012-01-24: 100 mL via INTRAVENOUS

## 2012-01-24 MED ORDER — IPRATROPIUM BROMIDE 0.02 % IN SOLN
0.5000 mg | Freq: Once | RESPIRATORY_TRACT | Status: AC
  Administered 2012-01-24: 0.5 mg via RESPIRATORY_TRACT
  Filled 2012-01-24: qty 2.5

## 2012-01-24 NOTE — H&P (Signed)
[facility] History and Physical  ARY LAVINE ZOX:[id number] DOB: 1933/09/02 DOA: 01/24/2012  Referring physician: Dr. Ignacia Palma PCP: [name], MD  Specialists: ENT: Dr. Ezzard Standing  Chief Complaint: cough  HPI: TRITON [name] is a 79 y.o. male with history of laryngeal cancer status post permanent tracheostomy. Patient was in his usual state of health up until about 10 days ago he started or shortness of breath, cough, generalized weakness. He started notice an increase in sputum production. He went to his primary care doctor and received oral antibiotics and steroids. When his condition failed to improve he presents to the emergency room for evaluation. He continues to smoke cigarettes. He otherwise does not have any medical problems and is relatively functional. Approach to the emergency room he was noted to be in respiratory distress, and chest x-ray indicated a new pneumonia. He received treatment with antibiotics and nebulizer treatments. He feels significantly improved at this point. He will be admitted for further treatment.  Review of Systems: Pertinent positives as per history of present illness, otherwise negative  Past Medical History  Diagnosis Date  . Tracheostomy in place    Past Surgical History  Procedure Date  . Tracheostomy   . Lung removal, partial     left side  . Layngectomy for laryngeal cancer in2010; left upper lobectomy for lung cancer, 2010.    Social History:  reports that he has been smoking.  He does not have any smokeless tobacco history on file. He reports that he does not drink alcohol or use illicit drugs. Lives at home with his wife, independent with ADLs  No Known Allergies  Family history: Father died of heart attack, mother died of pneumonia  Prior to Admission medications   Medication Sig Start Date End Date Taking? Authorizing Provider  azithromycin (ZITHROMAX) 250 MG tablet Take 250-500 mg by mouth daily. Take two tablets by mouth on  day 1, then take one tablet daily on days 2 through 5 01/20/12  Yes Historical Provider, MD  HYDROcodone-acetaminophen (NORCO/VICODIN) 5-325 MG per tablet Take 0.25-0.5 tablets by mouth 2 (two) times daily as needed. *Must last 30 days* 01/19/12  Yes Historical Provider, MD  LORazepam (ATIVAN) 1 MG tablet Take 1 mg by mouth at bedtime as needed. For sleep   Yes Historical Provider, MD  predniSONE (DELTASONE) 20 MG tablet Take 20-40 mg by mouth daily. Take two tablets by mouth daily for 3 days, then take one tablet daily for 3 days 01/20/12  Yes Historical Provider, MD   Physical Exam: Filed Vitals:   01/24/12 1330 01/24/12 1442 01/24/12 1531 01/24/12 1803  BP: 144/97  134/91 127/94  Pulse: 109  108 104  Temp: 97.7 F (36.5 C)     TempSrc:    Oral  Resp: 38  30   Height: 5\' 7"  (1.702 m)     Weight: 58.968 kg (130 lb)     SpO2: 90% 88% 85% 98%     General:  Sitting comfortably in bed, no signs of distress  Eyes: Pupils are equal round react to light  ENT: Dry mucous membranes, there is some pharyngeal erythema  Neck: Supple  Cardiovascular: S1-S2, tachycardic  Respiratory: Clear to auscultation bilaterally  Abdomen: Soft, nontender, nondistended, bowel sound are active  Skin: Deferred   Musculoskeletal: Deferred  Psychiatric: Normal affect, cooperative with exam  Neurologic: Grossly intact, nonfocal  Labs on Admission:  Basic Metabolic Panel:  Lab 01/24/12 8119  NA 137  K 4.2  CL 100  CO2 27  GLUCOSE 102*  BUN 31*  CREATININE 1.57*  CALCIUM 9.9  MG --  PHOS --   Liver Function Tests:  Lab 01/24/12 1314  AST 18  ALT 20  ALKPHOS 75  BILITOT 0.3  PROT 8.4*  ALBUMIN 3.7   No results found for this basename: LIPASE:5,AMYLASE:5 in the last 168 hours No results found for this basename: AMMONIA:5 in the last 168 hours CBC:  Lab 01/24/12 1314  WBC 9.1  NEUTROABS 8.0*  HGB 12.7*  HCT 38.9*  MCV 93.1  PLT 306   Cardiac Enzymes: No results found for this  basename: CKTOTAL:5,CKMB:5,CKMBINDEX:5,TROPONINI:5 in the last 168 hours  BNP (last 3 results) No results found for this basename: PROBNP:3 in the last 8760 hours CBG: No results found for this basename: GLUCAP:5 in the last 168 hours  Radiological Exams on Admission: Dg Chest 2 View  01/24/2012  *RADIOLOGY REPORT*  Clinical Data: Tracheostomy.  Cough.  CHEST - 2 VIEW  Comparison: 11/02/2009.  04/04/2008.  Findings: Left lung is over penetrated.  There is no left-sided airspace disease.  There is diffuse right upper lobe airspace disease.  Chronic blunting of the left costophrenic angle. Paratracheal scarring in the left upper lobe.  Cardiopericardial silhouette appears within normal limits.  There is no right-sided pleural effusion.  IMPRESSION:  1.  Diffuse right upper lobe airspace disease favored to represent pneumonia over asymmetric/atypical pulmonary edema.  Aspiration unlikely given the upper lobe distribution. 2.  Probable left upper lobectomy with associated scarring.   Original Report Authenticated By: Andreas Newport, M.D.     EKG: Independently reviewed. Sinus tachycardia  Assessment/Plan Principal Problem:  *Community acquired pneumonia Active Problems:  TOBACCO ABUSE  Acute respiratory failure  Acute renal failure  Dehydration   1. Acute respiratory failure due to community-acquired pneumonia. Patient was placed on broad-spectrum antibiotics with ceftriaxone and azithromycin. Will continue nebulizer treatments as well as mucolytics. He will be monitored on telemetry. We'll titrate oxygen as tolerated. Followup cultures. 2. Acute renal failure, likely secondary to dehydration. We'll give IV fluids and recheck labs in the morning. 3. Tobacco use. Counseled on importance of tobacco cessation.  Code Status: full code Family Communication: discussed with patient and wife at the bedside Disposition Plan: discharge home once improved  Time spent:  Eliya Bubar [facility] Pager [phone]  If 7PM-7AM, please contact night-coverage [email or web address] Password [facility] 01/24/2012, 6:52 PM

## 2012-01-24 NOTE — ED Notes (Signed)
Pt c/o congestion, sob for the past two days, was seen by Dr. Gerda Diss over the weekend, given steroids, antibiotics, and breathing tx, pt states that he is not getting any better, cough is productive with blood tinged sputum,

## 2012-01-24 NOTE — ED Provider Notes (Signed)
History  This chart was scribed for Carleene Cooper III, MD by Ardeen Jourdain, ED Scribe. This patient was seen in room APA06/APA06 and the patient's care was started at 1357.  CSN: [id number]  Arrival date & time 01/24/12  1317   First MD Initiated Contact with Patient 01/24/12 1357      Chief Complaint  Patient presents with  . Shortness of Breath     The history is provided by the patient. No language interpreter was used.    Jeffrey [name] is a 79 y.o. male who presents to the Emergency Department complaining of SOB with associated productive cough and congestion. His wife states he has had a blood tinged sputum. His wife states he was seen by Dr. Gerda Diss over the weekend and was given steroids, antibiotics and breathing treatments with no relief. He denies any fever, bowel problems, urinary problems and leg swelling as associated symptoms.    Past Medical History  Diagnosis Date  . Tracheostomy in place     Past Surgical History  Procedure Date  . Tracheostomy   . Lung removal, partial     left side  . Layngectomy for laryngeal cancer in2010; left upper lobectomy for lung cancer, 2010.     No family history on file.  History  Substance Use Topics  . Smoking status: Current Every Day Smoker  . Smokeless tobacco: Not on file  . Alcohol Use: No      Review of Systems  Constitutional: Positive for fever.  Respiratory: Positive for cough and shortness of breath.   All other systems reviewed and are negative.    Allergies  Review of patient's allergies indicates no known allergies.  Home Medications  No current outpatient prescriptions on file.  Triage Vitals: BP 144/97  Pulse 109  Temp 97.7 F (36.5 C)  Resp 38  Ht 5\' 7"  (1.702 m)  Wt 130 lb (58.968 kg)  BMI 20.36 kg/m2  SpO2 90%  Physical Exam  Nursing note and vitals reviewed. Constitutional: He is oriented to person, place, and time. He appears well-developed and well-nourished. No distress.    HENT:  Head: Normocephalic and atraumatic.  Mouth/Throat: Oropharynx is clear and moist. No oropharyngeal exudate.       Well healed permanent trach  Eyes: EOM are normal. Pupils are equal, round, and reactive to light.  Neck: Normal range of motion. Neck supple. No tracheal deviation present.  Cardiovascular: Normal rate, regular rhythm and normal heart sounds.   Pulmonary/Chest: Effort normal and breath sounds normal. No respiratory distress. He has no wheezes.       Coarse rhonchi over left posterior chest   Abdominal: Soft. Bowel sounds are normal. He exhibits no distension. There is no tenderness.  Musculoskeletal: Normal range of motion. He exhibits no edema and no tenderness.  Neurological: He is alert and oriented to person, place, and time.  Skin: Skin is warm and dry.  Psychiatric: He has a normal mood and affect. His behavior is normal.    ED Course  Procedures (including critical care time)  DIAGNOSTIC STUDIES: Oxygen Saturation is 90% on room air, adequate by my interpretation.    COORDINATION OF CARE:  2:00 PM: Discussed treatment plan which includes CXR and CBC with pt at bedside and pt agreed to plan.   3:32 PM  Date: 01/24/2012  Rate: 103  Rhythm: sinus tachycardia  QRS Axis: left  Intervals: normal QRS:  Poor R wave progression in precordial leads suggests possible old anterior myocardial infarction.  ST/T Wave abnormalities: nonspecific T wave changes  Conduction Disutrbances:none  Narrative Interpretation: Abnormal EKG.  Old EKG Reviewed: unchanged  4:40 PM   Results for orders placed during the hospital encounter of 01/24/12  CBC WITH DIFFERENTIAL      Component Value Range   WBC 9.1  4.0 - 10.5 K/uL   RBC 4.18 (*) 4.22 - 5.81 MIL/uL   Hemoglobin 12.7 (*) 13.0 - 17.0 g/dL   HCT 84.6 (*) 96.2 - 95.2 %   MCV 93.1  78.0 - 100.0 fL   MCH 30.4  26.0 - 34.0 pg   MCHC 32.6  30.0 - 36.0 g/dL   RDW 84.1 (*) 32.4 - 40.1 %   Platelets 306  150 - 400 K/uL    Neutrophils Relative 88 (*) 43 - 77 %   Neutro Abs 8.0 (*) 1.7 - 7.7 K/uL   Lymphocytes Relative 7 (*) 12 - 46 %   Lymphs Abs 0.6 (*) 0.7 - 4.0 K/uL   Monocytes Relative 5  3 - 12 %   Monocytes Absolute 0.5  0.1 - 1.0 K/uL   Eosinophils Relative 0  0 - 5 %   Eosinophils Absolute 0.0  0.0 - 0.7 K/uL   Basophils Relative 0  0 - 1 %   Basophils Absolute 0.0  0.0 - 0.1 K/uL  COMPREHENSIVE METABOLIC PANEL      Component Value Range   Sodium 137  135 - 145 mEq/L   Potassium 4.2  3.5 - 5.1 mEq/L   Chloride 100  96 - 112 mEq/L   CO2 27  19 - 32 mEq/L   Glucose, Bld 102 (*) 70 - 99 mg/dL   BUN 31 (*) 6 - 23 mg/dL   Creatinine, Ser 0.27 (*) 0.50 - 1.35 mg/dL   Calcium 9.9  8.4 - 25.3 mg/dL   Total Protein 8.4 (*) 6.0 - 8.3 g/dL   Albumin 3.7  3.5 - 5.2 g/dL   AST 18  0 - 37 U/L   ALT 20  0 - 53 U/L   Alkaline Phosphatase 75  39 - 117 U/L   Total Bilirubin 0.3  0.3 - 1.2 mg/dL   GFR calc non Af Amer 41 (*) >90 mL/min   GFR calc Af Amer 48 (*) >90 mL/min  URINALYSIS, ROUTINE W REFLEX MICROSCOPIC      Component Value Range   Color, Urine YELLOW  YELLOW   APPearance CLEAR  CLEAR   Specific Gravity, Urine 1.015  1.005 - 1.030   pH 5.5  5.0 - 8.0   Glucose, UA NEGATIVE  NEGATIVE mg/dL   Hgb urine dipstick NEGATIVE  NEGATIVE   Bilirubin Urine NEGATIVE  NEGATIVE   Ketones, ur NEGATIVE  NEGATIVE mg/dL   Protein, ur TRACE (*) NEGATIVE mg/dL   Urobilinogen, UA 0.2  0.0 - 1.0 mg/dL   Nitrite NEGATIVE  NEGATIVE   Leukocytes, UA NEGATIVE  NEGATIVE  LACTIC ACID, PLASMA      Component Value Range   Lactic Acid, Venous 1.2  0.5 - 2.2 mmol/L  URINE MICROSCOPIC-ADD ON      Component Value Range   Squamous Epithelial / LPF FEW (*) RARE   WBC, UA 0-2  <3 WBC/hpf   Dg Chest 2 View  01/24/2012  *RADIOLOGY REPORT*  Clinical Data: Tracheostomy.  Cough.  CHEST - 2 VIEW  Comparison: 11/02/2009.  04/04/2008.  Findings: Left lung is over penetrated.  There is no left-sided airspace disease.  There is  diffuse right upper  lobe airspace disease.  Chronic blunting of the left costophrenic angle. Paratracheal scarring in the left upper lobe.  Cardiopericardial silhouette appears within normal limits.  There is no right-sided pleural effusion.  IMPRESSION:  1.  Diffuse right upper lobe airspace disease favored to represent pneumonia over asymmetric/atypical pulmonary edema.  Aspiration unlikely given the upper lobe distribution. 2.  Probable left upper lobectomy with associated scarring.   Original Report Authenticated By: Andreas Newport, M.D.     Chest x-ray shows RUL pneumonia, which has not responded to oral antibiotics.  Will call [facility] to admit him for community-acquired pneumonia.    5:05 PM Case discussed with Dr. Kerry Hough.  He will see pt and decide what type of bed placement he needs.   1. CAP (community acquired pneumonia)     I personally performed the services described in this documentation, which was scribed in my presence. The recorded information has been reviewed and is accurate. Osvaldo Human, MD      Carleene Cooper III, MD 01/26/12 [phone]

## 2012-01-25 DIAGNOSIS — I059 Rheumatic mitral valve disease, unspecified: Secondary | ICD-10-CM

## 2012-01-25 LAB — BASIC METABOLIC PANEL
BUN: 32 mg/dL — ABNORMAL HIGH (ref 6–23)
Calcium: 9 mg/dL (ref 8.4–10.5)
Chloride: 105 mEq/L (ref 96–112)
Creatinine, Ser: 1.59 mg/dL — ABNORMAL HIGH (ref 0.50–1.35)
Potassium: 4.2 mEq/L (ref 3.5–5.1)

## 2012-01-25 LAB — CBC
MCHC: 32.9 g/dL (ref 30.0–36.0)
Platelets: 284 10*3/uL (ref 150–400)
RDW: 16.5 % — ABNORMAL HIGH (ref 11.5–15.5)
WBC: 7.8 10*3/uL (ref 4.0–10.5)

## 2012-01-25 LAB — STREP PNEUMONIAE URINARY ANTIGEN: Strep Pneumo Urinary Antigen: NEGATIVE

## 2012-01-25 MED ORDER — FUROSEMIDE 10 MG/ML IJ SOLN
40.0000 mg | Freq: Two times a day (BID) | INTRAMUSCULAR | Status: DC
  Administered 2012-01-25: 40 mg via INTRAVENOUS
  Filled 2012-01-25: qty 4

## 2012-01-25 MED ORDER — LORAZEPAM 1 MG PO TABS
1.0000 mg | ORAL_TABLET | Freq: Every day | ORAL | Status: DC
  Administered 2012-01-25 – 2012-01-26 (×2): 1 mg via ORAL
  Filled 2012-01-25 (×2): qty 1

## 2012-01-25 NOTE — Care Management Note (Addendum)
    Page 1 of 1   01/27/2012     2:20:27 PM   CARE MANAGEMENT NOTE 01/27/2012  Patient:  Jeffrey [name], Jeffrey [name]   Account Number:  [id number]  Date Initiated:  01/25/2012  Documentation initiated by:  Rosemary Holms  Subjective/Objective Assessment:   Pt admitted from home where he lives with his wife. Pt has a trach and admitted for acute respiratory failure with PNA     Action/Plan:   Anticipated DC Date:  01/27/2012   Anticipated DC Plan:  HOME W HOME HEALTH SERVICES      DC Planning Services  CM consult      Choice offered to / List presented to:          [facility] arranged  HH-1 RN      [facility] agency  [facility]   Status of service:  Completed, signed off Medicare Important Message given?   (If response is "NO", the following Medicare IM given date fields will be blank) Date Medicare IM given:   Date Additional Medicare IM given:    Discharge Disposition:  HOME/SELF CARE  Per UR Regulation:    If discussed at Long Length of Stay Meetings, dates discussed:    Comments:  01/27/12 Rosemary Holms RN BSN CM [facility] to come tomorrow for foley care. DC'd home.  01/25/12 1030 Irianna Gilday RN BSN CM Pt sats when ambulating dropped to 78%. Pt needs to go home with O2 due to respiratory failure. Pt has trach PTA.

## 2012-01-25 NOTE — Progress Notes (Signed)
Utilization Review Complete  

## 2012-01-25 NOTE — Progress Notes (Signed)
*  PRELIMINARY RESULTS* Echocardiogram 2D Echocardiogram has been performed.  Conrad Alda 01/25/2012, 2:53 PM

## 2012-01-25 NOTE — Progress Notes (Signed)
MD ordered to ambulate in hall without oxygen.  Assisted Patient with ambulating in hall without oxygen.  Pt felt short of breath after ambulating 200 ft.  Found room air oxygen saturation at 78%.  Placed patient on oxygen at 4 lpm via trach collar and patient's oxygen saturation came up to 92%.  Assisted patient back to bed.  MD notified.  Will continue to monitor.  Schonewitz, Candelaria Stagers 01/25/2012

## 2012-01-25 NOTE — Progress Notes (Signed)
Patient had 8 beat run of v-tach. Vital signs stable and patient has no c/o chest pain at this time.  Notified Dr. Rito Ehrlich and received orders for patient.  Will continue to monitor patient.

## 2012-01-25 NOTE — Progress Notes (Signed)
[facility]             Progress Note   Subjective: Patient feels subjectively better.  He feels like he has returned to baseline.  Eager to get up and mobilize. He is having some cough productive of sputum.  Objective: Vital signs in last 24 hours: Temp:  [97.7 F (36.5 C)-98.8 F (37.1 C)] 98 F (36.7 C) (01/08 0552) Pulse Rate:  [104-109] 105  (01/07 2051) Resp:  [21-38] 21  (01/07 2051) BP: (98-144)/(70-97) 101/70 mmHg (01/08 0552) SpO2:  [85 %-98 %] 92 % (01/08 0722) FiO2 (%):  [35 %-40 %] 40 % (01/08 0722) Weight:  [58.968 kg (130 lb)] 58.968 kg (130 lb) (01/07 1330) Weight change:  Last BM Date: 01/24/12  Intake/Output from previous day: 01/07 0701 - 01/08 0700 In: 200 [P.O.:200] Out: 300 [Urine:300] Total I/O In: -  Out: 300 [Urine:300]   Physical Exam: General: Alert, awake, oriented x3, in no acute distress. HEENT: No bruits, no goiter. Heart: Regular rate and rhythm, without murmurs, rubs, gallops. Lungs: crackles in right upper lobe Abdomen: Soft, nontender, nondistended, positive bowel sounds. Extremities: No clubbing cyanosis or edema with positive pedal pulses. Neuro: Grossly intact, nonfocal.    Lab Results: Basic Metabolic Panel:  Basename 01/25/12 0520 01/24/12 1314  NA 140 137  K 4.2 4.2  CL 105 100  CO2 27 27  GLUCOSE 108* 102*  BUN 32* 31*  CREATININE 1.59* 1.57*  CALCIUM 9.0 9.9  MG 2.3 --  PHOS -- --   Liver Function Tests:  Basename 01/24/12 1314  AST 18  ALT 20  ALKPHOS 75  BILITOT 0.3  PROT 8.4*  ALBUMIN 3.7   No results found for this basename: LIPASE:2,AMYLASE:2 in the last 72 hours No results found for this basename: AMMONIA:2 in the last 72 hours CBC:  Basename 01/25/12 0520 01/24/12 1314  WBC 7.8 9.1  NEUTROABS -- 8.0*  HGB 12.3* 12.7*  HCT 37.4* 38.9*  MCV 93.5 93.1  PLT 284 306   Cardiac Enzymes: No results found for this basename: CKTOTAL:3,CKMB:3,CKMBINDEX:3,TROPONINI:3 in the last 72  hours BNP: No results found for this basename: PROBNP:3 in the last 72 hours D-Dimer: No results found for this basename: DDIMER:2 in the last 72 hours CBG:  Basename 01/24/12 2149  GLUCAP 130*   Hemoglobin A1C: No results found for this basename: HGBA1C in the last 72 hours Fasting Lipid Panel: No results found for this basename: CHOL,HDL,LDLCALC,TRIG,CHOLHDL,LDLDIRECT in the last 72 hours Thyroid Function Tests: No results found for this basename: TSH,T4TOTAL,FREET4,T3FREE,THYROIDAB in the last 72 hours Anemia Panel: No results found for this basename: VITAMINB12,FOLATE,FERRITIN,TIBC,IRON,RETICCTPCT in the last 72 hours Coagulation: No results found for this basename: LABPROT:2,INR:2 in the last 72 hours Urine Drug Screen: Drugs of Abuse  No results found for this basename: labopia, cocainscrnur, labbenz, amphetmu, thcu, labbarb    Alcohol Level: No results found for this basename: ETH:2 in the last 72 hours Urinalysis:  Basename 01/24/12 1535  COLORURINE YELLOW  LABSPEC 1.015  PHURINE 5.5  GLUCOSEU NEGATIVE  HGBUR NEGATIVE  BILIRUBINUR NEGATIVE  KETONESUR NEGATIVE  PROTEINUR TRACE*  UROBILINOGEN 0.2  NITRITE NEGATIVE  LEUKOCYTESUR NEGATIVE    No results found for this or any previous visit (from the past 240 hour(s)).  Studies/Results: Dg Chest 2 View  01/24/2012  *RADIOLOGY REPORT*  Clinical Data: Tracheostomy.  Cough.  CHEST - 2 VIEW  Comparison: 11/02/2009.  04/04/2008.  Findings: Left lung is over penetrated.  There is no left-sided airspace  disease.  There is diffuse right upper lobe airspace disease.  Chronic blunting of the left costophrenic angle. Paratracheal scarring in the left upper lobe.  Cardiopericardial silhouette appears within normal limits.  There is no right-sided pleural effusion.  IMPRESSION:  1.  Diffuse right upper lobe airspace disease favored to represent pneumonia over asymmetric/atypical pulmonary edema.  Aspiration unlikely given the upper  lobe distribution. 2.  Probable left upper lobectomy with associated scarring.   Original Report Authenticated By: Andreas Newport, M.D.     Medications: Scheduled Meds:   . albuterol  2.5 mg Nebulization Q6H  . azithromycin  500 mg Intravenous Q24H  . cefTRIAXone (ROCEPHIN)  IV  1 g Intravenous Q24H  . enoxaparin (LOVENOX) injection  40 mg Subcutaneous Q24H  . guaiFENesin  1,200 mg Oral BID  . ipratropium  0.5 mg Nebulization Q6H   Continuous Infusions:   . sodium chloride 100 mL (01/24/12 2022)   PRN Meds:.  Assessment/Plan:  Principal Problem:  *Community acquired pneumonia Active Problems:  TOBACCO ABUSE  Acute respiratory failure  Acute renal failure  Dehydration  1. Acute respiratory failure due to community acquired pneumonia. Patient is on IV antibiotics with rocephin and azithromycin.  He appears to be improving.  Will remove oxygen today and ambulate on room air. Continue incentive spirometry.  He has remained afebrile and has a normal wbc count. He will need repeat chest xray in 3-4 weeks to ensure clearing.  2. Acute renal failure, there may be an element of chronic kidney disease.  Last blood work in our system is from 2010.  Patient reports that he has been getting blood work with PCP and that renal function was reported to be abnormal.  Will request records from PCP to determine baseline creatinine.  Dispo. If patient continues to improve, he may be able to go home today or tomorrow   Time spent coordinating care:   LOS: 1 day   Alyana Kreiter [facility] Pager: [phone] 01/25/2012, 9:14 AM

## 2012-01-25 NOTE — Progress Notes (Signed)
ANTIBIOTIC CONSULT NOTE - INITIAL  Pharmacy Consult for Rocephin and Zithromax Indication: pneumonia  No Known Allergies  Patient Measurements: Height: 5\' 7"  (170.2 cm) Weight: 130 lb (58.968 kg) IBW/kg (Calculated) : 66.1   Vital Signs: Temp: 98 F (36.7 C) (01/08 0552) Temp src: Oral (01/08 0552) BP: 101/70 mmHg (01/08 0552) Pulse Rate: 105  (01/07 2051) Intake/Output from previous day: 01/07 0701 - 01/08 0700 In: 200 [P.O.:200] Out: 300 [Urine:300] Intake/Output from this shift:    Labs:  Basename 01/25/12 0520 01/24/12 1314  WBC 7.8 9.1  HGB 12.3* 12.7*  PLT 284 306  LABCREA -- --  CREATININE 1.59* 1.57*   Estimated Creatinine Clearance: 33 ml/min (by C-G formula based on Cr of 1.59). No results found for this basename: VANCOTROUGH:2,VANCOPEAK:2,VANCORANDOM:2,GENTTROUGH:2,GENTPEAK:2,GENTRANDOM:2,TOBRATROUGH:2,TOBRAPEAK:2,TOBRARND:2,AMIKACINPEAK:2,AMIKACINTROU:2,AMIKACIN:2, in the last 72 hours   Microbiology: No results found for this or any previous visit (from the past 720 hour(s)).  Medical History: Past Medical History  Diagnosis Date  . Tracheostomy in place     Medications:  Scheduled:    . albuterol  2.5 mg Nebulization Q6H  . [COMPLETED] albuterol  5 mg Nebulization Once  . [COMPLETED] azithromycin  500 mg Intravenous Once  . azithromycin  500 mg Intravenous Q24H  . [COMPLETED] cefTRIAXone (ROCEPHIN)  IV  1 g Intravenous Once  . cefTRIAXone (ROCEPHIN)  IV  1 g Intravenous Q24H  . enoxaparin (LOVENOX) injection  40 mg Subcutaneous Q24H  . guaiFENesin  1,200 mg Oral BID  . [COMPLETED] ipratropium  0.5 mg Nebulization Once  . ipratropium  0.5 mg Nebulization Q6H   Assessment: 79yo male admitted for respiratory distress.  Started on Rocephin and Zithromax for suspected pna.   Estimated Creatinine Clearance: 33 ml/min (by C-G formula based on Cr of 1.59).  Goal of Therapy:  Eradicate infection.  Plan: Continue Rocephin and Zithromax No  adjustments needed Monitor progress  [name] A 01/25/2012,8:29 AM

## 2012-01-26 ENCOUNTER — Inpatient Hospital Stay (HOSPITAL_COMMUNITY): Payer: Managed Care, Other (non HMO)

## 2012-01-26 DIAGNOSIS — R7989 Other specified abnormal findings of blood chemistry: Secondary | ICD-10-CM | POA: Diagnosis present

## 2012-01-26 DIAGNOSIS — N1330 Unspecified hydronephrosis: Secondary | ICD-10-CM | POA: Diagnosis present

## 2012-01-26 DIAGNOSIS — Z8521 Personal history of malignant neoplasm of larynx: Secondary | ICD-10-CM

## 2012-01-26 DIAGNOSIS — Z85118 Personal history of other malignant neoplasm of bronchus and lung: Secondary | ICD-10-CM

## 2012-01-26 DIAGNOSIS — F17200 Nicotine dependence, unspecified, uncomplicated: Secondary | ICD-10-CM

## 2012-01-26 DIAGNOSIS — R918 Other nonspecific abnormal finding of lung field: Secondary | ICD-10-CM | POA: Diagnosis present

## 2012-01-26 DIAGNOSIS — Z930 Tracheostomy status: Secondary | ICD-10-CM

## 2012-01-26 LAB — BASIC METABOLIC PANEL
GFR calc Af Amer: 37 mL/min — ABNORMAL LOW (ref >90)
GFR calc non Af Amer: 32 mL/min — ABNORMAL LOW (ref >90)
Glucose, Bld: 111 mg/dL — ABNORMAL HIGH (ref 70–99)
Potassium: 3.8 mEq/L (ref 3.5–5.1)
Sodium: 141 mEq/L (ref 135–145)

## 2012-01-26 LAB — VITAMIN D 25 HYDROXY (VIT D DEFICIENCY, FRACTURES): Vit D, 25-Hydroxy: 15 ng/mL — ABNORMAL LOW (ref 30–89)

## 2012-01-26 LAB — LEGIONELLA ANTIGEN, URINE

## 2012-01-26 MED ORDER — ENOXAPARIN SODIUM 30 MG/0.3ML ~~LOC~~ SOLN
30.0000 mg | SUBCUTANEOUS | Status: DC
  Administered 2012-01-26: 30 mg via SUBCUTANEOUS
  Filled 2012-01-26: qty 0.3

## 2012-01-26 MED ORDER — SODIUM CHLORIDE 0.45 % IV SOLN
INTRAVENOUS | Status: DC
  Administered 2012-01-26 – 2012-01-27 (×2): via INTRAVENOUS

## 2012-01-26 NOTE — Plan of Care (Signed)
Problem: Phase II Progression Outcomes Goal: Progress activity as tolerated unless otherwise ordered Ambulated in hallway this am without difficulty x's 1 assist.

## 2012-01-26 NOTE — Progress Notes (Signed)
[facility]             Progress Note   Subjective: Feels that breathing is improving. Reports that he was able to ambulate last night without much difficulty.  Objective: Vital signs in last 24 hours: Temp:  [98.7 F (37.1 C)] 98.7 F (37.1 C) (01/08 2310) Pulse Rate:  [102-107] 107  (01/08 2310) Resp:  [21-24] 22  (01/08 2310) BP: (119)/(83) 119/83 mmHg (01/08 2310) SpO2:  [74 %-100 %] 96 % (01/09 0712) FiO2 (%):  [35 %-40 %] 35 % (01/09 0712) Weight change:  Last BM Date: 01/24/12  Intake/Output from previous day: 01/08 0701 - 01/09 0700 In: 970 [P.O.:720; IV Piggyback:250] Out: 750 [Urine:750]     Physical Exam: General: Alert, awake, oriented x3, in no acute distress. HEENT: No bruits, no goiter. Tracheostomy in place. Heart: Regular rate and rhythm, without murmurs, rubs, gallops. Lungs: crackles in right upper lobe Abdomen: Soft, nontender, nondistended, positive bowel sounds. Extremities: pedal edema has improved Neuro: Grossly intact, nonfocal.    Lab Results: Basic Metabolic Panel:  Basename 01/26/12 0529 01/25/12 0520  NA 141 140  K 3.8 4.2  CL 105 105  CO2 25 27  GLUCOSE 111* 108*  BUN 37* 32*  CREATININE 1.93* 1.59*  CALCIUM 8.5 9.0  MG -- 2.3  PHOS -- --   Liver Function Tests:  Basename 01/24/12 1314  AST 18  ALT 20  ALKPHOS 75  BILITOT 0.3  PROT 8.4*  ALBUMIN 3.7   No results found for this basename: LIPASE:2,AMYLASE:2 in the last 72 hours No results found for this basename: AMMONIA:2 in the last 72 hours CBC:  Basename 01/25/12 0520 01/24/12 1314  WBC 7.8 9.1  NEUTROABS -- 8.0*  HGB 12.3* 12.7*  HCT 37.4* 38.9*  MCV 93.5 93.1  PLT 284 306   Cardiac Enzymes: No results found for this basename: CKTOTAL:3,CKMB:3,CKMBINDEX:3,TROPONINI:3 in the last 72 hours BNP:  Basename 01/25/12 1121  PROBNP 31079.0*   D-Dimer: No results found for this basename: DDIMER:2 in the last 72 hours CBG:  Basename 01/24/12  2149  GLUCAP 130*   Hemoglobin A1C: No results found for this basename: HGBA1C in the last 72 hours Fasting Lipid Panel: No results found for this basename: CHOL,HDL,LDLCALC,TRIG,CHOLHDL,LDLDIRECT in the last 72 hours Thyroid Function Tests: No results found for this basename: TSH,T4TOTAL,FREET4,T3FREE,THYROIDAB in the last 72 hours Anemia Panel: No results found for this basename: VITAMINB12,FOLATE,FERRITIN,TIBC,IRON,RETICCTPCT in the last 72 hours Coagulation: No results found for this basename: LABPROT:2,INR:2 in the last 72 hours Urine Drug Screen: Drugs of Abuse  No results found for this basename: labopia,  cocainscrnur,  labbenz,  amphetmu,  thcu,  labbarb    Alcohol Level: No results found for this basename: ETH:2 in the last 72 hours Urinalysis:  Basename 01/24/12 1535  COLORURINE YELLOW  LABSPEC 1.015  PHURINE 5.5  GLUCOSEU NEGATIVE  HGBUR NEGATIVE  BILIRUBINUR NEGATIVE  KETONESUR NEGATIVE  PROTEINUR TRACE*  UROBILINOGEN 0.2  NITRITE NEGATIVE  LEUKOCYTESUR NEGATIVE    Recent Results (from the past 240 hour(s))  CULTURE, BLOOD (ROUTINE X 2)     Status: Normal (Preliminary result)   Collection Time   01/24/12  1:33 PM      Component Value Range Status Comment   Specimen Description BLOOD LEFT ANTECUBITAL   Final    Special Requests     Final    Value: BOTTLES DRAWN AEROBIC AND ANAEROBIC AEB=10CC ANA=8CC   Culture NO GROWTH 1 DAY   Final  Report Status PENDING   Incomplete   CULTURE, BLOOD (ROUTINE X 2)     Status: Normal (Preliminary result)   Collection Time   01/24/12  1:34 PM      Component Value Range Status Comment   Specimen Description BLOOD RIGHT ANTECUBITAL DRAWN BY RN   Final    Special Requests BOTTLES DRAWN AEROBIC AND ANAEROBIC 8CC   Final    Culture NO GROWTH 1 DAY   Final    Report Status PENDING   Incomplete     Studies/Results: Dg Chest 2 View  01/24/2012  *RADIOLOGY REPORT*  Clinical Data: Tracheostomy.  Cough.  CHEST - 2 VIEW   Comparison: 11/02/2009.  04/04/2008.  Findings: Left lung is over penetrated.  There is no left-sided airspace disease.  There is diffuse right upper lobe airspace disease.  Chronic blunting of the left costophrenic angle. Paratracheal scarring in the left upper lobe.  Cardiopericardial silhouette appears within normal limits.  There is no right-sided pleural effusion.  IMPRESSION:  1.  Diffuse right upper lobe airspace disease favored to represent pneumonia over asymmetric/atypical pulmonary edema.  Aspiration unlikely given the upper lobe distribution. 2.  Probable left upper lobectomy with associated scarring.   Original Report Authenticated By: Andreas Newport, M.D.     Medications: Scheduled Meds:    . albuterol  2.5 mg Nebulization Q6H  . azithromycin  500 mg Intravenous Q24H  . cefTRIAXone (ROCEPHIN)  IV  1 g Intravenous Q24H  . enoxaparin (LOVENOX) injection  30 mg Subcutaneous Q24H  . guaiFENesin  1,200 mg Oral BID  . ipratropium  0.5 mg Nebulization Q6H  . LORazepam  1 mg Oral QHS   Continuous Infusions:  PRN Meds:.  Assessment/Plan:  Principal Problem:  *Community acquired pneumonia Active Problems:  TOBACCO ABUSE  Acute respiratory failure  Acute renal failure  Dehydration  History of laryngeal cancer  History of lung cancer  Tracheostomy in place  Elevated brain natriuretic peptide (BNP) level  1. Acute respiratory failure due to community acquired pneumonia. Patient is on IV antibiotics with rocephin and azithromycin.  He appears to be improving.  Continue incentive spirometry.  He has remained afebrile and has a normal wbc count. Will ambulate patient again today to monitor oxygen saturations. He will need repeat chest xray in 3-4 weeks to ensure clearing.  2. Acute renal failure. Discussed case with patient's PCP, Dr. Gerda Diss. Creatinine in 11/12 was 1.1. His recent bump in creatinine to 1.9 is likely due to lasix, but he does have an element of acute renal failure.   Will check renal ultrasound.  Urinalysis does not show any proteinuria. Will ask for nephrology input.  3. History of LUL lung cancer s/p resection. Discussed with Dr. Gerda Diss, and he has recommended checking CT of chest to monitor for recurrence.  Will order CT today.  This will have to be without contrast due to renal dysfunction  4. Elevated BNP.  Patient had markedly elevated BNP at [address]. Patient given lasix yesterday for diuresis.  His pedal edema has improved. Echo done yesterday with report pending. He has not had any recent chest pain or other cardiac symptoms.   Time spent coordinating care:   LOS: 2 days   MEMON,JEHANZEB [facility] Pager: [phone] 01/26/2012, 10:01 AM

## 2012-01-26 NOTE — Consult Note (Signed)
Reason for Consult: Acute kidney injury Referring Physician: Dr. [name] is an 79 y.o. male.  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 This is associated with difficulty in breathing. Presently patient says that he's feeling better her. Patient denies any nausea vomiting. Patient also denies any previous history of renal failure however he has recurrent history of urine tract infection and treated with Cipro about a month ago.  Past Medical History  Diagnosis Date  . Tracheostomy in place     Past Surgical History  Procedure Date  . Tracheostomy   . Lung removal, partial     left side  . Layngectomy for laryngeal cancer in2010; left upper lobectomy for lung cancer, 2010.     History reviewed. No pertinent family history.  Social History:  reports that he has been smoking.  He does not have any smokeless tobacco history on file. He reports that he does not drink alcohol or use illicit drugs.  Allergies: No Known Allergies  Medications: I have reviewed the patient's current medications.  Results for orders placed during the hospital encounter of 01/24/12 (from the past 48 hour(s))  CBC WITH DIFFERENTIAL     Status: Abnormal   Collection Time   01/24/12  1:14 PM      Component Value Range Comment   WBC 9.1  4.0 - 10.5 K/uL    RBC 4.18 (*) 4.22 - 5.81 MIL/uL    Hemoglobin 12.7 (*) 13.0 - 17.0 g/dL    HCT 56.2 (*) 13.0 - 52.0 %    MCV 93.1  78.0 - 100.0 fL    MCH 30.4  26.0 - 34.0 pg    MCHC 32.6  30.0 - 36.0 g/dL    RDW 86.5 (*) 78.4 - 15.5 %    Platelets 306  150 - 400 K/uL    Neutrophils Relative 88 (*) 43 - 77 %    Neutro Abs 8.0 (*) 1.7 - 7.7 K/uL    Lymphocytes Relative 7 (*) 12 - 46 %    Lymphs Abs 0.6 (*) 0.7 - 4.0 K/uL    Monocytes Relative 5  3 - 12 %    Monocytes Absolute 0.5  0.1 - 1.0 K/uL    Eosinophils Relative 0  0 - 5 %    Eosinophils Absolute 0.0  0.0 - 0.7 K/uL    Basophils Relative 0  0 - 1 %    Basophils Absolute 0.0  0.0 - 0.1 K/uL   COMPREHENSIVE METABOLIC PANEL     Status: Abnormal   Collection Time   01/24/12  1:14 PM      Component Value Range Comment   Sodium 137  135 - 145 mEq/L    Potassium 4.2  3.5 - 5.1 mEq/L    Chloride 100  96 - 112 mEq/L    CO2 27  19 - 32 mEq/L    Glucose, Bld 102 (*) 70 - 99 mg/dL    BUN 31 (*) 6 - 23 mg/dL    Creatinine, Ser 6.96 (*) 0.50 - 1.35 mg/dL    Calcium 9.9  8.4 - 29.5 mg/dL    Total Protein 8.4 (*) 6.0 - 8.3 g/dL    Albumin 3.7  3.5 - 5.2 g/dL    AST 18  0 - 37 U/L    ALT 20  0 - 53 U/L    Alkaline Phosphatase 75  39 - 117 U/L    Total Bilirubin 0.3  0.3 - 1.2 mg/dL    GFR calc non Af Amer 41 (*) >90 mL/min    GFR calc Af Amer 48 (*) >90 mL/min   LACTIC ACID, PLASMA     Status: Normal   Collection Time   01/24/12  1:20 PM      Component Value Range Comment   Lactic Acid, Venous 1.2  0.5 - 2.2 mmol/L   CULTURE, BLOOD (ROUTINE X 2)     Status: Normal (Preliminary result)   Collection Time   01/24/12  1:33 PM      Component Value Range Comment   Specimen Description BLOOD LEFT ANTECUBITAL      Special Requests        Value: BOTTLES DRAWN AEROBIC AND ANAEROBIC AEB=10CC ANA=8CC   Culture NO GROWTH 2 DAYS      Report Status PENDING     CULTURE, BLOOD (ROUTINE X 2)     Status: Normal (Preliminary result)   Collection Time   01/24/12  1:34 PM      Component Value Range Comment   Specimen Description BLOOD RIGHT ANTECUBITAL DRAWN BY RN      Special Requests BOTTLES DRAWN AEROBIC AND ANAEROBIC 8CC      Culture NO GROWTH 2 DAYS      Report Status PENDING     URINALYSIS, ROUTINE W REFLEX MICROSCOPIC     Status: Abnormal   Collection Time   01/24/12  3:35 PM      Component Value Range Comment   Color, Urine YELLOW  YELLOW    APPearance CLEAR  CLEAR    Specific Gravity, Urine 1.015  1.005 - 1.030    pH 5.5  5.0 - 8.0     Glucose, UA NEGATIVE  NEGATIVE mg/dL    Hgb urine dipstick NEGATIVE  NEGATIVE    Bilirubin Urine NEGATIVE  NEGATIVE    Ketones, ur NEGATIVE  NEGATIVE mg/dL    Protein, ur TRACE (*) NEGATIVE mg/dL    Urobilinogen, UA 0.2  0.0 - 1.0 mg/dL    Nitrite NEGATIVE  NEGATIVE    Leukocytes, UA NEGATIVE  NEGATIVE   URINE MICROSCOPIC-ADD ON     Status: Abnormal   Collection Time   01/24/12  3:35 PM      Component Value Range Comment   Squamous Epithelial / LPF FEW (*) RARE    WBC, UA 0-2  <3 WBC/hpf   HIV ANTIBODY (ROUTINE TESTING)     Status: Normal   Collection Time   01/24/12  7:58 PM      Component Value Range Comment   HIV NON REACTIVE  NON REACTIVE   INFLUENZA PANEL BY PCR     Status: Normal   Collection Time   01/24/12  8:56 PM      Component Value Range Comment   Influenza A By PCR NEGATIVE  NEGATIVE    Influenza B By PCR NEGATIVE  NEGATIVE    H1N1 flu by pcr NOT DETECTED  NOT DETECTED   STREP PNEUMONIAE URINARY ANTIGEN     Status: Normal   Collection Time   01/24/12  9:04 PM      Component Value Range Comment   Strep Pneumo Urinary Antigen NEGATIVE  NEGATIVE   GLUCOSE, CAPILLARY     Status: Abnormal   Collection Time   01/24/12  9:49 PM      Component Value  Range Comment   Glucose-Capillary 130 (*) 70 - 99 mg/dL    Comment 1 Notify RN     CBC     Status: Abnormal   Collection Time   01/25/12  5:20 AM      Component Value Range Comment   WBC 7.8  4.0 - 10.5 K/uL    RBC 4.00 (*) 4.22 - 5.81 MIL/uL    Hemoglobin 12.3 (*) 13.0 - 17.0 g/dL    HCT 16.1 (*) 09.6 - 52.0 %    MCV 93.5  78.0 - 100.0 fL    MCH 30.8  26.0 - 34.0 pg    MCHC 32.9  30.0 - 36.0 g/dL    RDW 04.5 (*) 40.9 - 15.5 %    Platelets 284  150 - 400 K/uL   BASIC METABOLIC PANEL     Status: Abnormal   Collection Time   01/25/12  5:20 AM      Component Value Range Comment   Sodium 140  135 - 145 mEq/L    Potassium 4.2  3.5 - 5.1 mEq/L    Chloride 105  96 - 112 mEq/L    CO2 27  19 - 32 mEq/L    Glucose, Bld 108 (*) 70 -  99 mg/dL    BUN 32 (*) 6 - 23 mg/dL    Creatinine, Ser 8.11 (*) 0.50 - 1.35 mg/dL    Calcium 9.0  8.4 - 91.4 mg/dL    GFR calc non Af Amer 41 (*) >90 mL/min    GFR calc Af Amer 47 (*) >90 mL/min   MAGNESIUM     Status: Normal   Collection Time   01/25/12  5:20 AM      Component Value Range Comment   Magnesium 2.3  1.5 - 2.5 mg/dL   PRO B NATRIURETIC PEPTIDE     Status: Abnormal   Collection Time   01/25/12 11:21 AM      Component Value Range Comment   Pro B Natriuretic peptide (BNP) 31079.0 (*) 0 - 450 pg/mL   BASIC METABOLIC PANEL     Status: Abnormal   Collection Time   01/26/12  5:29 AM      Component Value Range Comment   Sodium 141  135 - 145 mEq/L    Potassium 3.8  3.5 - 5.1 mEq/L    Chloride 105  96 - 112 mEq/L    CO2 25  19 - 32 mEq/L    Glucose, Bld 111 (*) 70 - 99 mg/dL    BUN 37 (*) 6 - 23 mg/dL    Creatinine, Ser 7.82 (*) 0.50 - 1.35 mg/dL    Calcium 8.5  8.4 - 95.6 mg/dL    GFR calc non Af Amer 32 (*) >90 mL/min    GFR calc Af Amer 37 (*) >90 mL/min     Dg Chest 2 View  01/24/2012  *RADIOLOGY REPORT*  Clinical Data: Tracheostomy.  Cough.  CHEST - 2 VIEW  Comparison: 11/02/2009.  04/04/2008.  Findings: Left lung is over penetrated.  There is no left-sided airspace disease.  There is diffuse right upper lobe airspace disease.  Chronic blunting of the left costophrenic angle. Paratracheal scarring in the left upper lobe.  Cardiopericardial silhouette appears within normal limits.  There is no right-sided pleural effusion.  IMPRESSION:  1.  Diffuse right upper lobe airspace disease favored to represent pneumonia over asymmetric/atypical pulmonary edema.  Aspiration unlikely given the upper lobe distribution. 2.  Probable left upper lobectomy  with associated scarring.   Original Report Authenticated By: Andreas Newport, M.D.     Review of Systems  Constitutional: Negative for fever and chills.  Respiratory: Positive for cough, sputum production and shortness of breath.     Gastrointestinal: Negative for nausea and vomiting.  Genitourinary: Positive for dysuria and urgency. Negative for flank pain.   Blood pressure 105/67, pulse 94, temperature 98.3 F (36.8 C), temperature source Oral, resp. rate 20, height 5\' 7"  (1.702 m), weight 58.968 kg (130 lb), SpO2 90.00%. Physical Exam  Constitutional: No distress.  Eyes: No scleral icterus.  Neck: Normal range of motion. Neck supple. No JVD present.  Cardiovascular: Normal rate and regular rhythm.   Respiratory: He has no wheezes. He has rales.  GI: He exhibits no distension. There is no tenderness. There is no rebound.  Musculoskeletal: He exhibits no edema.    Assessment/Plan: Problem #1 acute kidney injury most likely secondary to prerenal syndrome versus ATN. His creatinine on 11/29 in for10/ was 0.8.The present increasing BUN and creatinine her could be secondary to ATN however other etiologies cannot ruled out. Patient is presently none oliguric and is a symptomatic. Problem #2 history of her urine or tract infection patient was treated with antibiotics and has been on Cipro according to patient he completed the treatment in November of last year. Problem #3 history of possible comment acquired pneumonia is on antibiotics. Problem #4 history of laryngeal cancer he status post thoracostomy. Patient has received radiation therapy but no chemotherapy. Problem #5 history of COPD Plan: Agree with ultrasound of the kidneys We'll start him on half normal saline at 75 cc per hour We'll check his basic metabolic panel, phosphorus, intact PTH in the morning. We'll check also his vitamin D level.  Jeffrey [name] S 01/26/2012, 12:24 PM

## 2012-01-26 NOTE — Progress Notes (Signed)
Pharmacist Heart Failure Core Measure Documentation  Assessment: Jeffrey [name] has an EF documented as 15-20 on 01/25/2012 by ECHO.  Rationale: Heart failure patients with left ventricular systolic dysfunction (LVSD) and an EF < 40% should be prescribed an angiotensin converting enzyme inhibitor (ACEI) or angiotensin receptor blocker (ARB) at discharge unless a contraindication is documented in the medical record.  This patient is not currently on an ACEI or ARB for HF.  This note is being placed in the record in order to provide documentation that a contraindication to the use of these agents is present for this encounter.  ACE Inhibitor or Angiotensin Receptor Blocker is contraindicated (specify all that apply)  []   ACEI allergy AND ARB allergy []   Angioedema []   Moderate or severe aortic stenosis []   Hyperkalemia []   Hypotension []   Renal artery stenosis [x]   Worsening renal function, preexisting renal disease or dysfunction   [name] A 01/26/2012 1:48 PM

## 2012-01-27 DIAGNOSIS — I519 Heart disease, unspecified: Secondary | ICD-10-CM

## 2012-01-27 DIAGNOSIS — N1330 Unspecified hydronephrosis: Secondary | ICD-10-CM

## 2012-01-27 DIAGNOSIS — I429 Cardiomyopathy, unspecified: Secondary | ICD-10-CM | POA: Diagnosis present

## 2012-01-27 LAB — BASIC METABOLIC PANEL WITH GFR
BUN: 37 mg/dL — ABNORMAL HIGH (ref 6–23)
CO2: 26 meq/L (ref 19–32)
Calcium: 8 mg/dL — ABNORMAL LOW (ref 8.4–10.5)
Chloride: 104 meq/L (ref 96–112)
Creatinine, Ser: 2.04 mg/dL — ABNORMAL HIGH (ref 0.50–1.35)
GFR calc Af Amer: 35 mL/min — ABNORMAL LOW
GFR calc non Af Amer: 30 mL/min — ABNORMAL LOW
Glucose, Bld: 104 mg/dL — ABNORMAL HIGH (ref 70–99)
Potassium: 4 meq/L (ref 3.5–5.1)
Sodium: 139 meq/L (ref 135–145)

## 2012-01-27 LAB — PTH, INTACT AND CALCIUM
Calcium, Total (PTH): 8.5 mg/dL (ref 8.4–10.5)
PTH: 104.5 pg/mL — ABNORMAL HIGH (ref 14.0–72.0)

## 2012-01-27 LAB — PHOSPHORUS: Phosphorus: 4.5 mg/dL (ref 2.3–4.6)

## 2012-01-27 MED ORDER — ASPIRIN 81 MG PO TBEC: 81.0000 mg | DELAYED_RELEASE_TABLET | Freq: Every day | ORAL | Status: DC

## 2012-01-27 MED ORDER — ASPIRIN EC 81 MG PO TBEC
81.0000 mg | DELAYED_RELEASE_TABLET | Freq: Every day | ORAL | Status: DC
  Administered 2012-01-27: 81 mg via ORAL
  Filled 2012-01-27: qty 1

## 2012-01-27 MED ORDER — LEVOFLOXACIN 750 MG PO TABS: 750.0000 mg | ORAL_TABLET | ORAL | Status: DC

## 2012-01-27 MED ORDER — CARVEDILOL 3.125 MG PO TABS: 3.1250 mg | ORAL_TABLET | Freq: Two times a day (BID) | ORAL | Status: DC

## 2012-01-27 MED ORDER — LEVOFLOXACIN 750 MG PO TABS
750.0000 mg | ORAL_TABLET | Freq: Every day | ORAL | Status: DC
  Administered 2012-01-27: 750 mg via ORAL
  Filled 2012-01-27: qty 1

## 2012-01-27 MED ORDER — CARVEDILOL 3.125 MG PO TABS
3.1250 mg | ORAL_TABLET | Freq: Two times a day (BID) | ORAL | Status: DC
  Administered 2012-01-27: 3.125 mg via ORAL
  Filled 2012-01-27: qty 1

## 2012-01-27 NOTE — Progress Notes (Signed)
Discharge instructions and prescriptions given, instruction on emptying foley/leg bag given, both patient and wife verbalized understanding, out via w/c in stable condition with staff.

## 2012-01-27 NOTE — Progress Notes (Signed)
Subjective: Interval History: has no complaint of nausea or vomiting. Patient appetite is good and he doesn't have any difficulty in breathing. Patient also denies any abdominal pain..  Objective: Vital signs in last 24 hours: Temp:  [97.9 F (36.6 C)-98.4 F (36.9 C)] 98.4 F (36.9 C) (01/10 0512) Pulse Rate:  [89-108] 94  (01/10 0512) Resp:  [20] 20  (01/10 0512) BP: (108-116)/(72-78) 108/76 mmHg (01/10 0512) SpO2:  [90 %-100 %] 100 % (01/10 0512) FiO2 (%):  [35 %] 35 % (01/09 1407) Weight change:   Intake/Output from previous day: 01/09 0701 - 01/10 0700 In: 720 [P.O.:720] Out: 2250 [Urine:2250] Intake/Output this shift:    General appearance: alert, cooperative and no distress Resp: clear to auscultation bilaterally Cardio: regular rate and rhythm, S1, S2 normal, no murmur, click, rub or gallop GI: soft, non-tender; bowel sounds normal; no masses,  no organomegaly Extremities: extremities normal, atraumatic, no cyanosis or edema  Lab Results:  Basename 01/25/12 0520 01/24/12 1314  WBC 7.8 9.1  HGB 12.3* 12.7*  HCT 37.4* 38.9*  PLT 284 306   BMET:  Basename 01/27/12 0550 01/26/12 0529  NA 139 141  K 4.0 3.8  CL 104 105  CO2 26 25  GLUCOSE 104* 111*  BUN 37* 37*  CREATININE 2.04* 1.93*  CALCIUM 8.0* 8.5   No results found for this basename: PTH:2 in the last 72 hours Iron Studies: No results found for this basename: IRON,TIBC,TRANSFERRIN,FERRITIN in the last 72 hours  Studies/Results: Ct Chest Wo Contrast  01/26/2012  *RADIOLOGY REPORT*  Clinical Data: Lung cancer and laryngeal cancer.  CT CHEST WITHOUT CONTRAST  Technique:  Multidetector CT imaging of the chest was performed following the standard protocol without IV contrast.  Comparison: PET 10/20/2009 and CT chest 05/25/2010, 10/10/2008.  Findings: Mediastinal lymph nodes measure up to 14 mm in short axis in the lower right paratracheal station (previously 8 mm).  Hilar regions are difficult to definitively  evaluate without IV contrast but the right hilum does appear asymmetrically prominent (image 32).  No axillary adenopathy.  Heart is enlarged.  Coronary artery calcification.  No pericardial effusion.  Small left pleural effusion appears chronic, given the associated pleural thickening.  Small right pleural effusion.  A spiculated nodule in the apical right upper lobe measures 6 x 9 mm and is new. Emphysema. One, possibly two, new nodule([name]) in the right middle lobe (images 33 and 41). There is a nodular airspace consolidation in the right lower lobe (example image 46).  Ground-glass and mild septal thickening in the right upper lobe, new.  Postoperative changes of left upper lobectomy.  A fair amount of debris is seen in the trachea and mainstem bronchi. Tracheotomy is noted.  Incidental imaging of the upper abdomen shows marked left hydronephrosis, partially imaged but new.  Sub centimeter low density lesion in the liver (image 53), possibly present on the prior exam.  No worrisome lytic or sclerotic lesions.  IMPRESSION:  1.  Spiculated nodules in the right upper and right middle lobes, most consistent with recurrent/metastatic disease. 2.  Ground-glass and septal thickening in the right upper lobe are worrisome for lymphangitic carcinomatosis.  Possible associated right hilar fullness, not well evaluated without IV contrast. 3.  New right paratracheal adenopathy. 4.  Small right pleural effusion with areas of adjacent right lower lobe air space consolidation. 5.  Postoperative changes of left upper lobectomy with chronic small left pleural effusion. 6.  Severe left hydronephrosis, partially imaged but new from the prior  exam.  CT abdomen pelvis recommended further evaluation, as clinically indicated. These results will be called to the ordering clinician or representative by the Radiologist Assistant, and communication documented in the PACS Dashboard.   Original Report Authenticated By: Leanna Battles, M.D.     US Renal  01/26/2012  *RADIOLOGY REPORT*  Clinical Data: Acute renal failure  RENAL/URINARY TRACT ULTRASOUND COMPLETE  Comparison:  PET CT 10/20/2009  Findings:  Right Kidney:  12.4 cm length. Mild renal cortical thinning secondary to marked hydronephrosis.  No definite solid mass or shadowing calcification.  Left Kidney:  10.6 cm length. Mild renal cortical thinning secondary to marked hydronephrosis.  No solid mass or shadowing calcification.  Bladder:  Markedly distended with evidence of bladder wall trabeculation suggesting chronic outlet obstruction. Calculated prevoid bladder volume is 1512 ml with increase in calculated bladder volume to 1591 ml following attempted voiding.  IMPRESSION: Markedly distended urinary bladder in excess of 1.5 liters volume. Bladder wall trabeculation likely representing chronic outlet obstruction. Marked bilateral hydronephrosis.  Findings called to [facility] on [address] on 01/26/2012 at 1721 hrs.   Original Report Authenticated By: Ulyses Southward, M.D.     I have reviewed the patient'[name] current medications.  Assessment/Plan: Problem #1 acute kidney injury his BUN and creatinine this moment seems to be increasing. His urine output has improved. Ultrasound of the kidneys showed severe bilateral hydronephrosis left greater than right. Hence dealing with obstructive uropathy. Presently patient with Foley catheter with good urine outputs. Problem #2 chronic underlying renal failure right kidney 12.4 yesterday came 10.6  with some cortical thinning consistent with chronic renal failure. Since patient has obstructive uropathy most likely we're dealing with focal segmental nephrosclerosis. However since his renal function was normal recently the present increasing BUN and creatinine could be attributed to the present obstruction. Since patient has distended bladder the obstruction could be bladder neck obstruction. At this moment or sure whether the Foley catheter was placed after the  ultrasound or before that. If the Foley catheter is placed after the study most likely this could be secondary to BPH or ureteral stricture. In either case patient may benefit from evaluation by urology. Problem #3 laryngeal CA status post permanent thoracostomy. Problem #4 patient wheeze right upper and right middle lobe nodules at this moment not sure whether this is recurrence of his laryngeal CA. Problem #5 history of COPD Plan: We'll continue his hydration and possibly patient may benefit from urology consult. We'll check his basic metabolic panel in the morning.   LOS: 3 days   Jeffrey [name] 01/27/2012,8:51 AM

## 2012-01-27 NOTE — Consult Note (Signed)
CARDIOLOGY CONSULT NOTE  Patient ID: Jeffrey [name] MRN: [id number] DOB/AGE: 03/06/1933 79 y.o.  Admit date: 01/24/2012 Referring Physician: PTH-Memon Primary PhysicianLUKING,W S, MD Primary Cardiologist: Ardeen Jourdain MD Reason for Consultation:Systolic Dysfunction with EF of 15%-New Finding  Principal Problem:  *Community acquired pneumonia Active Problems:  TOBACCO ABUSE  Acute respiratory failure  Acute renal failure  Dehydration  History of laryngeal cancer  History of lung cancer  Tracheostomy in place  Elevated brain natriuretic peptide (BNP) level  Pulmonary nodules  Bilateral hydronephrosis  Cardiomyopathy  HPI: Mr. Jeffrey [name] is a 79 y/o patient with no prior cardiac history followed by Dr. Gerda Diss we are asked to see for abnormal Echocardiogram with EF of 15%-20% with severe global hypokinesis to [place]. He was admitted with community acquired pneumonia, [place] [id number], pulmonary edema with acute respiratory failure after several days of increasing dyspnea and coughing.[name] Kitchen He was also found to be in renal failure due to chronic outlet obstruction with bilateral hydronephrosis. Dr.Befekadu is consulting and has placed a foley catheter and has begun hydration.     He has a history of laryngeal CA with tracheostomy in place,, left upper lobe CA s/p resection with radiation but no chemo 3 years ago, with CT of the chest on 01/26/2012  demonstrating spiculated nodules in the right upper and right middle lobes, most consistent with recurrent/metastatic disease.  Review of systems complete and found to be negative unless listed above   Past Medical History  Diagnosis Date  . Tracheostomy in place     History reviewed. No pertinent family history.  History   Social History  . Marital Status: Married    Spouse Name: N/A    Number of Children: N/A  . Years of Education: N/A   Occupational History  . Not on file.   Social History Main Topics  . Smoking  status: Current Every Day Smoker  . Smokeless tobacco: Not on file  . Alcohol Use: No  . Drug Use: No  . Sexually Active: Not Currently   Other Topics Concern  . Not on file   Social History Narrative  . No narrative on file    Past Surgical History  Procedure Date  . Tracheostomy   . Lung removal, partial     left side  . Layngectomy for laryngeal cancer in2010; left upper lobectomy for lung cancer, 2010.      Prescriptions prior to admission  Medication Sig Dispense Refill  . azithromycin (ZITHROMAX) 250 MG tablet Take 250-500 mg by mouth daily. Take two tablets by mouth on day 1, then take one tablet daily on days 2 through 5      . HYDROcodone-acetaminophen (NORCO/VICODIN) 5-325 MG per tablet Take 0.25-0.5 tablets by mouth 2 (two) times daily as needed. *Must last 30 days*      . LORazepam (ATIVAN) 1 MG tablet Take 1 mg by mouth at bedtime as needed. For sleep      . predniSONE (DELTASONE) 20 MG tablet Take 20-40 mg by mouth daily. Take two tablets by mouth daily for 3 days, then take one tablet daily for 3 days        Physical Exam: Blood pressure 108/76, pulse 94, temperature 98.4 F (36.9 C), temperature source Oral, resp. rate 20, height 5\' 7"  (1.702 m), weight 130 lb (58.968 kg), SpO2 100.00%.  General: Well developed, well nourished, in no acute distress, uses a mechanical larynx for communication Head: Eyes PERRLA, No xanthomas.   Normal cephalic and atramatic  Lungs: Clear; increased AP diameter, absent in the upper left lobe, no wheezes or coughing with deep inspiration.  Heart: HRRR S1 S2 distant with soft 1/6 systolic murmur. Regular tachycardia at a heart rate of 100 bpm;  Pulses are 2+ & equal.  No carotid bruit. No JVD.  No abdominal bruits. No femoral bruits. Abdomen: Bowel sounds are positive, abdomen soft and non-tender without masses or hernia's noted. Msk:  Back normal, normal gait. Normal strength and tone for age. Extremities: No clubbing, cyanosis or  edema.  DP +1 Multiple area of old ecchymosis on the arms.  Neuro: Alert and oriented X 3. Psych:  Good affect, responds appropriately   Lab Results  Component Value Date   WBC 7.8 01/25/2012   HGB 12.3* 01/25/2012   HCT 37.4* 01/25/2012   MCV 93.5 01/25/2012   PLT 284 01/25/2012     Lab 01/27/12 0550 01/24/12 1314  NA 139 --  K 4.0 --  CL 104 --  CO2 26 --  BUN 37* --  CREATININE 2.04* --  CALCIUM 8.0* --  PROT -- 8.4*  BILITOT -- 0.3  ALKPHOS -- 75  ALT -- 20  AST -- 18  GLUCOSE 104* --   Echocardiogram: 01/25/2012  Left ventricle: The cavity size was mildly dilated. Wall thickness was normal. Systolic function was severely reduced. The estimated ejection fraction was in the range of 15% to 20%. Severe globalhypokinesis to akinesis except the basilar inferolateral segment, which is moderately hypokinetic. - Aortic valve: Moderately calcified annulus. Possibly bicuspid; mildly thickened leaflets. Thickening. - Mitral valve: Mild regurgitation. - Left atrium: The atrium was mildly dilated. - Right ventricle: The cavity size was mildly dilated. Systolic function was moderately reduced. Systolic pressure was moderately increased. - Atrial septum: No defect or patent foramen ovale was identified. - Tricuspid valve: Mild-moderate regurgitation. - Pulmonary arteries: PA peak pressure: 58mm Hg (S). - Pericardium, extracardiac: A trivial pericardial effusion was identified posterior to the heart. There was a moderate-sized right pleural effusion. There was a moderate-sized left pleural effusion.  Radiology: Ct Chest Wo Contrast  01/26/2012  *RADIOLOGY REPORT*  Clinical Data: Lung cancer and laryngeal cancer.  CT CHEST WITHOUT CONTRAST   IMPRESSION:  1.  Spiculated nodules in the right upper and right middle lobes, most consistent with recurrent/metastatic disease. 2.  Ground-glass and septal thickening in the right upper lobe are worrisome for lymphangitic carcinomatosis.  Possible  associated right hilar fullness, not well evaluated without IV contrast. 3.  New right paratracheal adenopathy. 4.  Small right pleural effusion with areas of adjacent right lower lobe air space consolidation. 5.  Postoperative changes of left upper lobectomy with chronic small left pleural effusion. 6.  Severe left hydronephrosis, partially imaged but new from the prior exam.  CT abdomen pelvis recommended further evaluation, as clinically indicated. These results will be called to the ordering clinician or representative by the Radiologist Assistant, and communication documented in the PACS Dashboard.   Original Report Authenticated By: Leanna Battles, M.D.    US Renal  01/26/2012  *RADIOLOGY REPORT*  Clinical Data: Acute renal failure  RENAL/URINARY TRACT ULTRASOUND COMPLETE  Comparison:  PET CT 10/20/2009  Findings:  Right Kidney:  12.4 cm length. Mild renal cortical thinning secondary to marked hydronephrosis.  No definite solid mass or shadowing calcification.  Left Kidney:  10.6 cm length. Mild renal cortical thinning secondary to marked hydronephrosis.  No solid mass or shadowing calcification.  Bladder:  Markedly distended with evidence of bladder wall trabeculation  suggesting chronic outlet obstruction. Calculated prevoid bladder volume is 1512 ml with increase in calculated bladder volume to 1591 ml following attempted voiding.  IMPRESSION: Markedly distended urinary bladder in excess of 1.5 liters volume. Bladder wall trabeculation likely representing chronic outlet obstruction. Marked bilateral hydronephrosis.  Findings called to [facility] on 300 Unit on 01/26/2012 at 1721 hrs.   Original Report Authenticated By: Ulyses Southward, M.D.    ZOX:WRUEA tachycardia with nonspecific T-wave abnormality.  1. Acute Systolic Dysfunction: Newly diagnosed on this admission by echo without prior cardiac history. He in not on diuretics at present and is being hydrated gently by Dr.Befekadu. His breathing status is  improved. This is a difficult situation with multiple co-morbidities, most substantially metastatic lung cancer found on CT this admission. He was given IV diuretics initially in the setting of pulmonary edema but this has been discontinued.   Long discussion with the patient and with Dr. Kerry Hough concerning aggressiveness of care and proceeding with more invasive testing. The patient does not wish to have invasive testing or further cardiac testing at present. He wants to go home and take care of family issues. He is willing to be seen as OP with medical management only. He wishes to talk to his family concerning future treatment options.   I have discussed the risks of lethal arrhythmias and recurrent CHF with severe systolic dysfunction.[name] Kitchen He verbalizes understanding. Will place on low dose BB Coreg 3.125 mg BID, and ASA. He is currently being hydrated due to renal failure, but will need to have low dose diuretic to prevent future CHF events once he has improvement. Low output state is certainly contributing to his kidney status. Overall poor prognosis.  2. Acute Respiratory Failure: He is being treated with antibiotics for CAP per PTH.  3. Probable Metastatic Lung Cancer: History of laryngeal and left upper lobe CA with abnormal CT highly suggestive of METS to the right upper and middle lobes. Dr. Kerry Hough is referring him to Dr. Jodene Nam for ongoing treatment options.  4.COPD 5. Bilateral hydronephrosis 6. Ongoing tobacco abuse  Bettey Mare. Lyman Bishop NP [facility] 01/27/2012, 9:35 AM  Cardiology Attending Patient interviewed and examined. Discussed with Joni Reining, NP.  Above note annotated and modified based upon my findings.  Radiographic testing does not indicate the presence of pulmonary edema nor does the patient definitely had pneumonia. A question of lymphangitic spread of neoplastic disease to the right upper lobe is raised. Acute renal failure as the result of bladder outlet  obstruction was also contributing to the patient's presenting symptoms.  There has been no significant diuresis since admission, nor does diuresis appear necessary. He will require treatment with beta blocker and ACE inhibitor, but the latter will be held pending stabilization of his renal dysfunction. No further testing or treatment and hospital is required from a cardiology standpoint. We will be happy to follow him as an outpatient.   Bing, MD 01/27/2012, 5:13 PM

## 2012-01-27 NOTE — Progress Notes (Signed)
ANTIBIOTIC CONSULT NOTE  Pharmacy Consult for Renal dose adjustment Indication: pneumonia  No Known Allergies  Patient Measurements: Height: 5\' 7"  (170.2 cm) Weight: 130 lb (58.968 kg) IBW/kg (Calculated) : 66.1   Vital Signs: Temp: 98.4 F (36.9 C) (01/10 0512) Temp src: Oral (01/10 0512) BP: 108/76 mmHg (01/10 0512) Pulse Rate: 94  (01/10 0512) Intake/Output from previous day: 01/09 0701 - 01/10 0700 In: 720 [P.O.:720] Out: 2250 [Urine:2250] Intake/Output from this shift:    Labs:  Basename 01/27/12 0550 01/26/12 0529 01/25/12 0520 01/24/12 1314  WBC -- -- 7.8 9.1  HGB -- -- 12.3* 12.7*  PLT -- -- 284 306  LABCREA -- -- -- --  CREATININE 2.04* 1.93* 1.59* --   Estimated Creatinine Clearance: 25.7 ml/min (by C-G formula based on Cr of 2.04). No results found for this basename: VANCOTROUGH:2,VANCOPEAK:2,VANCORANDOM:2,GENTTROUGH:2,GENTPEAK:2,GENTRANDOM:2,TOBRATROUGH:2,TOBRAPEAK:2,TOBRARND:2,AMIKACINPEAK:2,AMIKACINTROU:2,AMIKACIN:2, in the last 72 hours   Microbiology: Recent Results (from the past 720 hour(s))  CULTURE, BLOOD (ROUTINE X 2)     Status: Normal (Preliminary result)   Collection Time   01/24/12  1:33 PM      Component Value Range Status Comment   Specimen Description BLOOD LEFT ANTECUBITAL   Final    Special Requests     Final    Value: BOTTLES DRAWN AEROBIC AND ANAEROBIC AEB=10CC ANA=8CC   Culture NO GROWTH 3 DAYS   Final    Report Status PENDING   Incomplete   CULTURE, BLOOD (ROUTINE X 2)     Status: Normal (Preliminary result)   Collection Time   01/24/12  1:34 PM      Component Value Range Status Comment   Specimen Description BLOOD RIGHT ANTECUBITAL DRAWN BY RN   Final    Special Requests BOTTLES DRAWN AEROBIC AND ANAEROBIC 8CC   Final    Culture NO GROWTH 3 DAYS   Final    Report Status PENDING   Incomplete     Medical History: Past Medical History  Diagnosis Date  . Tracheostomy in place     Medications:  Scheduled:     . albuterol   2.5 mg Nebulization Q6H  . aspirin EC  81 mg Oral Daily  . carvedilol  3.125 mg Oral BID WC  . enoxaparin (LOVENOX) injection  30 mg Subcutaneous Q24H  . guaiFENesin  1,200 mg Oral BID  . ipratropium  0.5 mg Nebulization Q6H  . levofloxacin  750 mg Oral Q48H  . LORazepam  1 mg Oral QHS  . [DISCONTINUED] azithromycin  500 mg Intravenous Q24H  . [DISCONTINUED] cefTRIAXone (ROCEPHIN)  IV  1 g Intravenous Q24H  . [DISCONTINUED] levofloxacin  750 mg Oral Daily   Assessment: 79yo male admitted for respiratory distress.  Started on Rocephin and Zithromax for suspected pna.   Transitioned to Levaquin today.  Scr has been increasing since admission with estimated CrCl~77ml/min today. Levaquin dosing recommendations for CrCl20-58ml/min is 750mg  q48hrs.  Goal of Therapy:  Eradicate infection.  Plan: 1) Decrease Levaquin 750mg  po Q48h 2) Monitor renal function 3) Duration of therapy per MD  Elson Clan 01/27/2012,11:18 AM

## 2012-01-27 NOTE — Progress Notes (Signed)
[facility]             Progress Note   Subjective: Feeling good, does not have any complaints, wants to go home. No shortness of breath or chest pain.  Coughing has improved. Foley catheter placed last night due to enlarged bladder and bilateral hydronephrosis.  Objective: Vital signs in last 24 hours: Temp:  [97.9 F (36.6 C)-98.4 F (36.9 C)] 98.4 F (36.9 C) (01/10 0512) Pulse Rate:  [89-108] 94  (01/10 0512) Resp:  [20] 20  (01/10 0512) BP: (108-116)/(72-78) 108/76 mmHg (01/10 0512) SpO2:  [90 %-100 %] 100 % (01/10 0512) FiO2 (%):  [35 %] 35 % (01/09 1407) Weight change:  Last BM Date: 01/24/12  Intake/Output from previous day: 01/09 0701 - 01/10 0700 In: 720 [P.O.:720] Out: 2250 [Urine:2250]     Physical Exam: General: Alert, awake, oriented x3, in no acute distress. HEENT: No bruits, no goiter. Tracheostomy in place. Heart: Regular rate and rhythm, without murmurs, rubs, gallops. Lungs: crackles in right upper lobe Abdomen: Soft, nontender, nondistended, positive bowel sounds. Extremities: pedal edema has improved Neuro: Grossly intact, nonfocal.    Lab Results: Basic Metabolic Panel:  Basename 01/27/12 0550 01/26/12 0529 01/25/12 0520  NA 139 141 --  K 4.0 3.8 --  CL 104 105 --  CO2 26 25 --  GLUCOSE 104* 111* --  BUN 37* 37* --  CREATININE 2.04* 1.93* --  CALCIUM 8.0* 8.5 --  MG -- -- 2.3  PHOS 4.5 -- --   Liver Function Tests:  Basename 01/24/12 1314  AST 18  ALT 20  ALKPHOS 75  BILITOT 0.3  PROT 8.4*  ALBUMIN 3.7   No results found for this basename: LIPASE:2,AMYLASE:2 in the last 72 hours No results found for this basename: AMMONIA:2 in the last 72 hours CBC:  Basename 01/25/12 0520 01/24/12 1314  WBC 7.8 9.1  NEUTROABS -- 8.0*  HGB 12.3* 12.7*  HCT 37.4* 38.9*  MCV 93.5 93.1  PLT 284 306   Cardiac Enzymes: No results found for this basename: CKTOTAL:3,CKMB:3,CKMBINDEX:3,TROPONINI:3 in the last 72  hours BNP:  Basename 01/25/12 1121  PROBNP 31079.0*   D-Dimer: No results found for this basename: DDIMER:2 in the last 72 hours CBG:  Basename 01/24/12 2149  GLUCAP 130*   Hemoglobin A1C: No results found for this basename: HGBA1C in the last 72 hours Fasting Lipid Panel: No results found for this basename: CHOL,HDL,LDLCALC,TRIG,CHOLHDL,LDLDIRECT in the last 72 hours Thyroid Function Tests: No results found for this basename: TSH,T4TOTAL,FREET4,T3FREE,THYROIDAB in the last 72 hours Anemia Panel: No results found for this basename: VITAMINB12,FOLATE,FERRITIN,TIBC,IRON,RETICCTPCT in the last 72 hours Coagulation: No results found for this basename: LABPROT:2,INR:2 in the last 72 hours Urine Drug Screen: Drugs of Abuse  No results found for this basename: labopia,  cocainscrnur,  labbenz,  amphetmu,  thcu,  labbarb    Alcohol Level: No results found for this basename: ETH:2 in the last 72 hours Urinalysis:  Basename 01/24/12 1535  COLORURINE YELLOW  LABSPEC 1.015  PHURINE 5.5  GLUCOSEU NEGATIVE  HGBUR NEGATIVE  BILIRUBINUR NEGATIVE  KETONESUR NEGATIVE  PROTEINUR TRACE*  UROBILINOGEN 0.2  NITRITE NEGATIVE  LEUKOCYTESUR NEGATIVE    Recent Results (from the past 240 hour(s))  CULTURE, BLOOD (ROUTINE X 2)     Status: Normal (Preliminary result)   Collection Time   01/24/12  1:33 PM      Component Value Range Status Comment   Specimen Description BLOOD LEFT ANTECUBITAL   Final    Special  Requests     Final    Value: BOTTLES DRAWN AEROBIC AND ANAEROBIC AEB=10CC ANA=8CC   Culture NO GROWTH 3 DAYS   Final    Report Status PENDING   Incomplete   CULTURE, BLOOD (ROUTINE X 2)     Status: Normal (Preliminary result)   Collection Time   01/24/12  1:34 PM      Component Value Range Status Comment   Specimen Description BLOOD RIGHT ANTECUBITAL DRAWN BY RN   Final    Special Requests BOTTLES DRAWN AEROBIC AND ANAEROBIC 8CC   Final    Culture NO GROWTH 3 DAYS   Final    Report  Status PENDING   Incomplete     Studies/Results: Ct Chest Wo Contrast  01/26/2012  *RADIOLOGY REPORT*  Clinical Data: Lung cancer and laryngeal cancer.  CT CHEST WITHOUT CONTRAST  Technique:  Multidetector CT imaging of the chest was performed following the standard protocol without IV contrast.  Comparison: PET 10/20/2009 and CT chest 05/25/2010, 10/10/2008.  Findings: Mediastinal lymph nodes measure up to 14 mm in short axis in the lower right paratracheal station (previously 8 mm).  Hilar regions are difficult to definitively evaluate without IV contrast but the right hilum does appear asymmetrically prominent (image 32).  No axillary adenopathy.  Heart is enlarged.  Coronary artery calcification.  No pericardial effusion.  Small left pleural effusion appears chronic, given the associated pleural thickening.  Small right pleural effusion.  A spiculated nodule in the apical right upper lobe measures 6 x 9 mm and is new. Emphysema. One, possibly two, new nodule(s) in the right middle lobe (images 33 and 41). There is a nodular airspace consolidation in the right lower lobe (example image 46).  Ground-glass and mild septal thickening in the right upper lobe, new.  Postoperative changes of left upper lobectomy.  A fair amount of debris is seen in the trachea and mainstem bronchi. Tracheotomy is noted.  Incidental imaging of the upper abdomen shows marked left hydronephrosis, partially imaged but new.  Sub centimeter low density lesion in the liver (image 53), possibly present on the prior exam.  No worrisome lytic or sclerotic lesions.  IMPRESSION:  1.  Spiculated nodules in the right upper and right middle lobes, most consistent with recurrent/metastatic disease. 2.  Ground-glass and septal thickening in the right upper lobe are worrisome for lymphangitic carcinomatosis.  Possible associated right hilar fullness, not well evaluated without IV contrast. 3.  New right paratracheal adenopathy. 4.  Small right  pleural effusion with areas of adjacent right lower lobe air space consolidation. 5.  Postoperative changes of left upper lobectomy with chronic small left pleural effusion. 6.  Severe left hydronephrosis, partially imaged but new from the prior exam.  CT abdomen pelvis recommended further evaluation, as clinically indicated. These results will be called to the ordering clinician or representative by the Radiologist Assistant, and communication documented in the PACS Dashboard.   Original Report Authenticated By: Leanna Battles, M.D.    US Renal  01/26/2012  *RADIOLOGY REPORT*  Clinical Data: Acute renal failure  RENAL/URINARY TRACT ULTRASOUND COMPLETE  Comparison:  PET CT 10/20/2009  Findings:  Right Kidney:  12.4 cm length. Mild renal cortical thinning secondary to marked hydronephrosis.  No definite solid mass or shadowing calcification.  Left Kidney:  10.6 cm length. Mild renal cortical thinning secondary to marked hydronephrosis.  No solid mass or shadowing calcification.  Bladder:  Markedly distended with evidence of bladder wall trabeculation suggesting chronic outlet obstruction. Calculated  prevoid bladder volume is 1512 ml with increase in calculated bladder volume to 1591 ml following attempted voiding.  IMPRESSION: Markedly distended urinary bladder in excess of 1.5 liters volume. Bladder wall trabeculation likely representing chronic outlet obstruction. Marked bilateral hydronephrosis.  Findings called to [facility] on 300 Unit on 01/26/2012 at 1721 hrs.   Original Report Authenticated By: Ulyses Southward, M.D.     Medications: Scheduled Meds:    . albuterol  2.5 mg Nebulization Q6H  . azithromycin  500 mg Intravenous Q24H  . cefTRIAXone (ROCEPHIN)  IV  1 g Intravenous Q24H  . enoxaparin (LOVENOX) injection  30 mg Subcutaneous Q24H  . guaiFENesin  1,200 mg Oral BID  . ipratropium  0.5 mg Nebulization Q6H  . LORazepam  1 mg Oral QHS   Continuous Infusions:    . sodium chloride 75 mL/hr at  01/27/12 0444   PRN Meds:.  Assessment/Plan:  Principal Problem:  *Community acquired pneumonia Active Problems:  TOBACCO ABUSE  Acute respiratory failure  Acute renal failure  Dehydration  History of laryngeal cancer  History of lung cancer  Tracheostomy in place  Elevated brain natriuretic peptide (BNP) level  Pulmonary nodules  Bilateral hydronephrosis  Systolic dysfunction, left ventricle  1. Acute respiratory failure due to community acquired pneumonia. Patient is on IV antibiotics with rocephin and azithromycin.  He appears to be improving.  Continue incentive spirometry.  He has remained afebrile and has a normal wbc count. Will change to oral antibiotics.  He is ambulating on room air and maintaining saturations.  His respiratory status appears to be back to baseline.  2. Acute renal failure. Discussed case with patient's PCP, Dr. Gerda Diss. Creatinine in 11/12 was 1.1. Renal ultrasound indicated distended bladder and bilateral hydronephrosis. Appreciate nephrology assistance.  Patient has been placed with a  Foley catheter and urology has been consulted. His renal failure is likely post obstructive in origin, although he may have an element of chronic kidney disease due to low output state. Urine output improved since foley catheter placed.  3. History of LUL lung cancer s/p resection. CT chest shows spiculated nodules in the right upper and middle lobes.  With his history of lung cancer, this certainly raises concern for recurrence. I informed patient of these findings and he is unsure if he wishes to pursue any further work up at this time. He wishes to go home and take some time to process this information. I have discussed the case with oncology and the patient has been set up with an outpatient appointment to discuss treatment options on 02/10/12.  4. Systolic dysfunction. Patient had elevated bnp on admission.  Echocardiogram shows EF of 15-20% with severe global hypokinesis to  akinesis except the basilar inferolateral segment, which is moderately hypokinetic.  Patient does not have any symptoms of decompensation at this time. Cardiology has been consulted regarding further work up.  He will need outpatient cardiac follow up.  5. Disposition.  Patient wishes to return home today. From a respiratory standpoint, I think he has returned to baseline. If he is cleared for outpatient follow up by both urology and cardiology, will plan on discharge home today.   Time spent coordinating care:   LOS: 3 days   Dejanira Pamintuan [facility] Pager: [phone] 01/27/2012, 9:59 AM

## 2012-01-27 NOTE — Consult Note (Signed)
Consult #[id number]

## 2012-01-28 NOTE — Consult Note (Signed)
NAMECRISTINA, MATTERN NO.:  [id number]  MEDICAL RECORD NO.:  [id number]  LOCATION:  A318                          FACILITY:  [facility]  PHYSICIAN:  Ky Barban, M.D.DATE OF BIRTH:  [birth year]  DATE OF CONSULTATION: DATE OF DISCHARGE:  01/27/2012                                CONSULTATION   CHIEF COMPLAINT:  Urinary retention.  HISTORY:  This 79 year old patient, who is admitted primarily because he does not complain of any urinary complaints, but he was catheterized and found to have about 900 mL of urine in the bladder.  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  It was done 4 years ago.  He was also found to have pneumonia.  I was asked to see him because of his urinary problems.  The patient is already doing well and he has a Foley catheter which is draining clear urine.  PHYSICAL EXAMINATION:  VITAL SIGNS:  His temperature is 98.4.  Blood pressure is 116/78. GENERAL:  He is sitting comfortably in the bed.  HEAD, NECK, EYE, ENT: Negative. CHEST:  Symmetrical. HEART:  Regular, sinus rhythm. ABDOMEN:  Soft, flat. LIVER:  Not palpable. SPLEEN:  Not palpable. KIDNEYS:  Not palpable. GENITOURINARY:  External genitalia is unremarkable.  Has Foley catheter in place which is draining clear urine.  LABORATORY DATA:  On admission, sodium 137, potassium 4.2, chloride 100, CO2 is 27, glucose 102, BUN is 31, creatinine 1.57.  CBC shows his WBC count is 9.1, hematocrit 38.9.  IMPRESSION:  Urinary retention, carcinoma of the larynx.  We will go ahead and schedule him.  He can go home and I will see him Wednesday in the office to schedule further workup with cysto.  He is going to go home with catheter.     Ky Barban, M.D.    MIJ/MEDQ  D:  01/27/2012  T:   01/28/2012  Job:  [id number]

## 2012-01-28 NOTE — Discharge Summary (Signed)
Physician Discharge Summary  Jeffrey [name] ZOX:[id number] DOB: 1933-12-24 DOA: 01/24/2012  PCP: [name], MD  Admit date: 01/24/2012 Discharge date: 01/28/2012  Time spent: 50 minutes  Recommendations for Outpatient Follow-up:  1. Patient will be discharged with foley catheter and follow up with Dr. Jerre Simon next week for urinary retention.  Renal function will be rechecked on this visit. 2. Follow up with Dr. Mariel Sleet on 02/10/12 to further evaluate right lung nodules 3. Follow up with Dr. Dietrich Pates regarding cardiomyopathy 4. Follow up with primary care doctor in 2 weeks  Discharge Diagnoses:  Principal Problem:  *Community acquired pneumonia Active Problems:  TOBACCO ABUSE  Acute respiratory failure  Acute renal failure  Dehydration  History of laryngeal cancer  History of lung cancer  Tracheostomy in place  Elevated brain natriuretic peptide (BNP) level  Pulmonary nodules  Bilateral hydronephrosis  Cardiomyopathy Bladder outlet obstruction Chronic systolic congestive heart failure, EF 15-20%  Discharge Condition: improved  Diet recommendation: low salt  Filed Weights   01/24/12 1330  Weight: 58.968 kg (130 lb)    History of present illness:  Jeffrey [name] is a 79 y.o. male with history of laryngeal cancer status post permanent tracheostomy and lung cancer s/p left upper lobectomy. Patient was in his usual state of health up until about 10 days ago he started or shortness of breath, cough, generalized weakness. He started notice an increase in sputum production. He went to his primary care doctor and received oral antibiotics and steroids. When his condition failed to improve he presents to the emergency room for evaluation. He continues to smoke cigarettes. He otherwise does not have any medical problems and is relatively functional. Approach to the emergency room he was noted to be in respiratory distress, and chest x-ray indicated a new pneumonia. He received treatment  with antibiotics and nebulizer treatments. He feels significantly improved at this point. He will be admitted for further treatment.   Hospital Course:  This gentleman was admitted to the hospital with shortness of breath.  Initial xray indicated a possible pneumonia so patient was admitted for IV antibiotics. He was also noted to have some renal dysfunction.  Through his hospital course, he began to improve clinically.  BNP was checked and found to be markedly elevated at [address]. Patient had 2 D echo done which revealed EF of 15-20%.  He did not have any signs of volume overload.  He was seen by [facility] and will follow up with them in the office to discuss further work up if desired. Regarding his renal failure, he was seen by nephrology and initially started on fluids and lasix.  When his renal function did not approve, renal ultrasound was done which revealed bilateral hydronephrosis, enlarged bladder and likely bladder outlet obstruction.  Foley catheter was placed and he was seen by urology.  He will follow up with Dr. Jerre Simon next week for further intervention. Regarding his prior lung cancer, He underwent CT of chest which revealed right upper and middle lobe nodules.  With the patient's history, there is certainly a risk of recurrent cancer.  I discussed this with the patient, and he wishes to pursue work up as an outpatient. He has been set up to Dr. Mariel Sleet in the oncology clinic in 2 weeks.   Patient was insistent on being discharged home today. He was cleared for discharge by all involved consultants.  His renal function will be followed up by Dr. Jerre Simon. He is ambulating on room air without any  difficulty    Procedures: 2D echo: - Left ventricle: The cavity size was mildly dilated. Wall thickness was normal. Systolic function was severely reduced. The estimated ejection fraction was in the range of 15% to 20%. Severe globalhypokinesis to akinesis except the basilar inferolateral  segment, which is moderately hypokinetic. - Aortic valve: Moderately calcified annulus. Possibly bicuspid; mildly thickened leaflets. Thickening. - Mitral valve: Mild regurgitation. - Left atrium: The atrium was mildly dilated. - Right ventricle: The cavity size was mildly dilated. Systolic function was moderately reduced. Systolic pressure was moderately increased. - Atrial septum: No defect or patent foramen ovale was identified. - Tricuspid valve: Mild-moderate regurgitation. - Pulmonary arteries: PA peak pressure: 58mm Hg (S). - Pericardium, extracardiac: A trivial pericardial effusion was identified posterior to the heart. There was a moderate-sized right pleural effusion. There was a moderate-sized left pleural effusion.    Consultations:  U{rology, Dr.Javaid  Cardiology, Dr. Dietrich Pates  Nephrology, Dr. Kristian Covey  Discharge Exam: Filed Vitals:   01/27/12 0151 01/27/12 0512 01/27/12 1344 01/27/12 1445  BP:  108/76  127/82  Pulse:  94  99  Temp:  98.4 F (36.9 C)  98.2 F (36.8 C)  TempSrc:  Oral  Oral  Resp:  20  20  Height:      Weight:      SpO2: 94% 100% 90% 93%    General: no distress, sitting comfortably Cardiovascular: S1, S2, RRR Respiratory: CTA B  Discharge Instructions  Discharge Orders    Future Appointments: Provider: Department: Dept Phone: Center:   02/10/2012 2:30 PM Randall An, MD [facility] [phone] None     Future Orders Please Complete By Expires   Diet - low sodium heart healthy      Continue foley catheter      Increase activity slowly      (HEART FAILURE PATIENTS) Call MD:  Anytime you have any of the following symptoms: 1) 3 pound weight gain in 24 hours or 5 pounds in 1 week 2) shortness of breath, with or without a dry hacking cough 3) swelling in the hands, feet or stomach 4) if you have to sleep on extra pillows at night in order to breathe.      Call MD for:  temperature >100.4      Call MD for:   difficulty breathing, headache or visual disturbances          Medication List     As of 01/28/2012 10:27 AM    STOP taking these medications         azithromycin 250 MG tablet   Commonly known as: ZITHROMAX      predniSONE 20 MG tablet   Commonly known as: DELTASONE      TAKE these medications         aspirin 81 MG EC tablet   Take 1 tablet (81 mg total) by mouth daily.      carvedilol 3.125 MG tablet   Commonly known as: COREG   Take 1 tablet (3.125 mg total) by mouth 2 (two) times daily with a meal.      HYDROcodone-acetaminophen 5-325 MG per tablet   Commonly known as: NORCO/VICODIN   Take 0.25-0.5 tablets by mouth 2 (two) times daily as needed. *Must last 30 days*      levofloxacin 750 MG tablet   Commonly known as: LEVAQUIN   Take 1 tablet (750 mg total) by mouth every other day.      LORazepam 1 MG tablet  Commonly known as: ATIVAN   Take 1 mg by mouth at bedtime as needed. For sleep           Follow-up Information    Follow up with Ky Barban, MD. On 02/01/2012. (At 10:00 am)    Contact information:   [address] [facility] [address] [phone]       Follow up with [facility]. (RN to visit 01/28/12, foley care)    Contact information:   [address] [place] [address] [phone]      Follow up with Ravenna Bing, MD. Schedule an appointment as soon as possible for a visit in 2 weeks. (Call office tomorrow to make your appointment.)    Contact information:   [address] [place] [address] [phone]       Follow up with Randall An, MD. On 02/10/2012. (oncology at 2:30pm on 4th floor of [facility])    Contact information:   [address] [place] [address] [phone]       Follow up with [name], MD. Schedule an appointment as soon as possible for a visit in 2 weeks. (Call the office to make your appointment)    Contact information:   [address] [place] [address] [phone]           The results of significant diagnostics from this hospitalization (including imaging, microbiology, ancillary and laboratory) are listed below for reference.    Significant Diagnostic Studies: Dg Chest 2 View  01/24/2012  *RADIOLOGY REPORT*  Clinical Data: Tracheostomy.  Cough.  CHEST - 2 VIEW  Comparison: 11/02/2009.  04/04/2008.  Findings: Left lung is over penetrated.  There is no left-sided airspace disease.  There is diffuse right upper lobe airspace disease.  Chronic blunting of the left costophrenic angle. Paratracheal scarring in the left upper lobe.  Cardiopericardial silhouette appears within normal limits.  There is no right-sided pleural effusion.  IMPRESSION:  1.  Diffuse right upper lobe airspace disease favored to represent pneumonia over asymmetric/atypical pulmonary edema.  Aspiration unlikely given the upper lobe distribution. 2.  Probable left upper lobectomy with associated scarring.   Original Report Authenticated By: Andreas Newport, M.D.    Ct Chest Wo Contrast  01/26/2012  *RADIOLOGY REPORT*  Clinical Data: Lung cancer and laryngeal cancer.  CT CHEST WITHOUT CONTRAST  Technique:  Multidetector CT imaging of the chest was performed following the standard protocol without IV contrast.  Comparison: PET 10/20/2009 and CT chest 05/25/2010, 10/10/2008.  Findings: Mediastinal lymph nodes measure up to 14 mm in short axis in the lower right paratracheal station (previously 8 mm).  Hilar regions are difficult to definitively evaluate without IV contrast but the right hilum does appear asymmetrically prominent (image 32).  No axillary adenopathy.  Heart is enlarged.  Coronary artery calcification.  No pericardial effusion.  Small left pleural effusion appears chronic, given the associated pleural thickening.  Small right pleural effusion.  A spiculated nodule in the apical right upper lobe measures 6 x 9 mm and is new. Emphysema. One, possibly  two, new nodule(s) in the right middle lobe (images 33 and 41). There is a nodular airspace consolidation in the right lower lobe (example image 46).  Ground-glass and mild septal thickening in the right upper lobe, new.  Postoperative changes of left upper lobectomy.  A fair amount of debris is seen in the trachea and mainstem bronchi. Tracheotomy is noted.  Incidental imaging of the upper  abdomen shows marked left hydronephrosis, partially imaged but new.  Sub centimeter low density lesion in the liver (image 53), possibly present on the prior exam.  No worrisome lytic or sclerotic lesions.  IMPRESSION:  1.  Spiculated nodules in the right upper and right middle lobes, most consistent with recurrent/metastatic disease. 2.  Ground-glass and septal thickening in the right upper lobe are worrisome for lymphangitic carcinomatosis.  Possible associated right hilar fullness, not well evaluated without IV contrast. 3.  New right paratracheal adenopathy. 4.  Small right pleural effusion with areas of adjacent right lower lobe air space consolidation. 5.  Postoperative changes of left upper lobectomy with chronic small left pleural effusion. 6.  Severe left hydronephrosis, partially imaged but new from the prior exam.  CT abdomen pelvis recommended further evaluation, as clinically indicated. These results will be called to the ordering clinician or representative by the Radiologist Assistant, and communication documented in the PACS Dashboard.   Original Report Authenticated By: Leanna Battles, M.D.    US Renal  01/26/2012  *RADIOLOGY REPORT*  Clinical Data: Acute renal failure  RENAL/URINARY TRACT ULTRASOUND COMPLETE  Comparison:  PET CT 10/20/2009  Findings:  Right Kidney:  12.4 cm length. Mild renal cortical thinning secondary to marked hydronephrosis.  No definite solid mass or shadowing calcification.  Left Kidney:  10.6 cm length. Mild renal cortical thinning secondary to marked hydronephrosis.  No solid mass or  shadowing calcification.  Bladder:  Markedly distended with evidence of bladder wall trabeculation suggesting chronic outlet obstruction. Calculated prevoid bladder volume is 1512 ml with increase in calculated bladder volume to 1591 ml following attempted voiding.  IMPRESSION: Markedly distended urinary bladder in excess of 1.5 liters volume. Bladder wall trabeculation likely representing chronic outlet obstruction. Marked bilateral hydronephrosis.  Findings called to [facility] on [address] on 01/26/2012 at 1721 hrs.   Original Report Authenticated By: Ulyses Southward, M.D.     Microbiology: Recent Results (from the past 240 hour(s))  CULTURE, BLOOD (ROUTINE X 2)     Status: Normal (Preliminary result)   Collection Time   01/24/12  1:33 PM      Component Value Range Status Comment   Specimen Description BLOOD LEFT ANTECUBITAL   Final    Special Requests     Final    Value: BOTTLES DRAWN AEROBIC AND ANAEROBIC AEB=10CC ANA=8CC   Culture NO GROWTH 4 DAYS   Final    Report Status PENDING   Incomplete   CULTURE, BLOOD (ROUTINE X 2)     Status: Normal (Preliminary result)   Collection Time   01/24/12  1:34 PM      Component Value Range Status Comment   Specimen Description BLOOD RIGHT ANTECUBITAL DRAWN BY RN   Final    Special Requests BOTTLES DRAWN AEROBIC AND ANAEROBIC 8CC   Final    Culture NO GROWTH 4 DAYS   Final    Report Status PENDING   Incomplete      Labs: Basic Metabolic Panel:  Lab 01/27/12 4098 01/26/12 1312 01/26/12 0529 01/25/12 0520 01/24/12 1314  NA 139 -- 141 140 137  K 4.0 -- 3.8 4.2 4.2  CL 104 -- 105 105 100  CO2 26 -- 25 27 27   GLUCOSE 104* -- 111* 108* 102*  BUN 37* -- 37* 32* 31*  CREATININE 2.04* -- 1.93* 1.59* 1.57*  CALCIUM 8.0* 8.5 8.5 9.0 9.9  MG -- -- -- 2.3 --  PHOS 4.5 -- -- -- --   Liver Function Tests:  Lab 01/24/12 1314  AST 18  ALT 20  ALKPHOS 75  BILITOT 0.3  PROT 8.4*  ALBUMIN 3.7   No results found for this basename: LIPASE:5,AMYLASE:5 in the last  168 hours No results found for this basename: AMMONIA:5 in the last 168 hours CBC:  Lab 01/25/12 0520 01/24/12 1314  WBC 7.8 9.1  NEUTROABS -- 8.0*  HGB 12.3* 12.7*  HCT 37.4* 38.9*  MCV 93.5 93.1  PLT 284 306   Cardiac Enzymes: No results found for this basename: CKTOTAL:5,CKMB:5,CKMBINDEX:5,TROPONINI:5 in the last 168 hours BNP: BNP (last 3 results)  Basename 01/25/12 1121  PROBNP 31079.0*   CBG:  Lab 01/24/12 2149  GLUCAP 130*       Signed:  Seneca Hoback  [facility] 01/28/2012, 10:27 AM

## 2012-01-29 LAB — CULTURE, BLOOD (ROUTINE X 2)

## 2012-02-09 ENCOUNTER — Encounter: Payer: Self-pay | Admitting: Cardiovascular Disease

## 2012-02-09 ENCOUNTER — Ambulatory Visit (INDEPENDENT_AMBULATORY_CARE_PROVIDER_SITE_OTHER): Payer: Medicare Other | Admitting: Cardiovascular Disease

## 2012-02-09 VITALS — BP 108/70 | HR 89 | Ht 67.5 in | Wt 133.0 lb

## 2012-02-09 DIAGNOSIS — R0602 Shortness of breath: Secondary | ICD-10-CM

## 2012-02-09 DIAGNOSIS — I429 Cardiomyopathy, unspecified: Secondary | ICD-10-CM

## 2012-02-09 DIAGNOSIS — I428 Other cardiomyopathies: Secondary | ICD-10-CM

## 2012-02-09 LAB — BASIC METABOLIC PANEL
BUN: 22 mg/dL (ref 6–23)
Calcium: 8.8 mg/dL (ref 8.4–10.5)
Chloride: 101 mEq/L (ref 96–112)
Creat: 1.26 mg/dL (ref 0.50–1.35)

## 2012-02-09 MED ORDER — LISINOPRIL 5 MG PO TABS: 5.0000 mg | ORAL_TABLET | Freq: Every day | ORAL | Status: DC

## 2012-02-09 NOTE — Progress Notes (Signed)
Patient ID: Jeffrey [name], male   DOB: Oct 19, 1933, 79 y.o.   MRN: [id number] 79 yo recently hospitalized with dyspnea and urinary retention.  Echo done showed EF 15-20%  Apparently Dr Dietrich Pates called and he indicated he would see patient as outpatient.  Patient has mild chronic dyspnea from previous CA and left lung lobectomy.  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  No previous cardiac issues.  No chest pain PND, orthopnea and no LE edema.  Seems compensated.  Not on ACE  ROS: Denies fever, malais, weight loss, blurry vision, decreased visual acuity, cough, sputum, SOB, hemoptysis, pleuritic pain, palpitaitons, heartburn, abdominal pain, melena, lower extremity edema, claudication, or rash.  All other systems reviewed and negative   General: Affect appropriate Chronically ill frail male HEENT: Tracheostomy Neck supple with no adenopathy JVP normal no bruits no thyromegaly Lungs poor breath sounds with left lobectomy scar Heart:  S1/S2 no murmur,rub, gallop or click PMI normal Abdomen: benighn, BS positve, no tenderness, no AAA no bruit.  No HSM or HJR Distal pulses intact with no bruits No edema Neuro non-focal Skin warm and dry No muscular weakness  Medications Current Outpatient Prescriptions  Medication Sig Dispense Refill  . aspirin EC 81 MG EC tablet Take 1 tablet (81 mg total) by mouth daily.  30 tablet  1  . carvedilol (COREG) 3.125 MG tablet Take 1 tablet (3.125 mg total) by mouth 2 (two) times daily with a meal.  60 tablet  1  . HYDROcodone-acetaminophen (NORCO/VICODIN) 5-325 MG per tablet Take 0.25-0.5 tablets by mouth 2 (two) times daily as needed. *Must last 30 days*      . LORazepam (ATIVAN) 1 MG tablet Take 1 mg by mouth at bedtime as needed. For sleep        Allergies Review of patient's allergies indicates no known allergies.  Family History: No family history on file.  Social History: History   Social History  . Marital Status: Married     Spouse Name: N/A    Number of Children: N/A  . Years of Education: N/A   Occupational History  . Not on file.   Social History Main Topics  . Smoking status: Current Every Day Smoker  . Smokeless tobacco: Not on file  . Alcohol Use: No  . Drug Use: No  . Sexually Active: Not Currently   Other Topics Concern  . Not on file   Social History Narrative  . No narrative on file    Electrocardiogram:  SR rate 103  No previous MI    Assessment and Plan

## 2012-02-09 NOTE — Patient Instructions (Addendum)
Your physician recommends that you schedule a follow-up appointment in: 3 months  Your physician has recommended you make the following change in your medication:  1 - START Lisinopril 5 mg daily

## 2012-02-10 ENCOUNTER — Encounter (HOSPITAL_COMMUNITY): Payer: Managed Care, Other (non HMO) | Attending: Oncology | Admitting: Oncology

## 2012-02-10 ENCOUNTER — Encounter (HOSPITAL_COMMUNITY): Payer: Self-pay | Admitting: Oncology

## 2012-02-10 VITALS — BP 100/68 | HR 84 | Temp 97.8°F | Resp 20 | Ht 64.75 in | Wt 131.1 lb

## 2012-02-10 DIAGNOSIS — J4489 Other specified chronic obstructive pulmonary disease: Secondary | ICD-10-CM | POA: Insufficient documentation

## 2012-02-10 DIAGNOSIS — F17200 Nicotine dependence, unspecified, uncomplicated: Secondary | ICD-10-CM

## 2012-02-10 DIAGNOSIS — C3490 Malignant neoplasm of unspecified part of unspecified bronchus or lung: Secondary | ICD-10-CM

## 2012-02-10 DIAGNOSIS — C329 Malignant neoplasm of larynx, unspecified: Secondary | ICD-10-CM

## 2012-02-10 DIAGNOSIS — R918 Other nonspecific abnormal finding of lung field: Secondary | ICD-10-CM

## 2012-02-10 DIAGNOSIS — I509 Heart failure, unspecified: Secondary | ICD-10-CM | POA: Insufficient documentation

## 2012-02-10 DIAGNOSIS — Z85118 Personal history of other malignant neoplasm of bronchus and lung: Secondary | ICD-10-CM

## 2012-02-10 DIAGNOSIS — J449 Chronic obstructive pulmonary disease, unspecified: Secondary | ICD-10-CM | POA: Insufficient documentation

## 2012-02-10 DIAGNOSIS — Z09 Encounter for follow-up examination after completed treatment for conditions other than malignant neoplasm: Secondary | ICD-10-CM | POA: Insufficient documentation

## 2012-02-10 DIAGNOSIS — Z8521 Personal history of malignant neoplasm of larynx: Secondary | ICD-10-CM

## 2012-02-10 LAB — COMPREHENSIVE METABOLIC PANEL
AST: 13 U/L (ref 0–37)
Albumin: 3.6 g/dL (ref 3.5–5.2)
Alkaline Phosphatase: 76 U/L (ref 39–117)
BUN: 18 mg/dL (ref 6–23)
Potassium: 4.4 mEq/L (ref 3.5–5.1)
Total Protein: 8 g/dL (ref 6.0–8.3)

## 2012-02-10 LAB — CBC WITH DIFFERENTIAL/PLATELET
HCT: 40.2 % (ref 39.0–52.0)
Hemoglobin: 13.2 g/dL (ref 13.0–17.0)
Lymphocytes Relative: 14 % (ref 12–46)
MCHC: 32.8 g/dL (ref 30.0–36.0)
MCV: 92.4 fL (ref 78.0–100.0)
Monocytes Absolute: 0.2 10*3/uL (ref 0.1–1.0)
Monocytes Relative: 6 % (ref 3–12)
Neutro Abs: 3.2 10*3/uL (ref 1.7–7.7)
WBC: 4.3 10*3/uL (ref 4.0–10.5)

## 2012-02-10 NOTE — Progress Notes (Signed)
Jeffrey [name] presented for labwork. Labs per MD order drawn via Peripheral Line 25 gauge needle inserted in lt ac  Good blood return present. Procedure without incident.  Needle removed intact. Patient tolerated procedure well.

## 2012-02-10 NOTE — Addendum Note (Signed)
Addended by: Edythe Lynn A on: 02/10/2012 04:26 PM   Modules accepted: Orders

## 2012-02-10 NOTE — Progress Notes (Signed)
Problem number 1 history of lung cancer 2010 status post surgical resection by Dr. Zenaida Niece tried Problem #2 laryngeal cancer status post surgery by Dr. Narda Bonds with a permanent tracheostomy  Problem #3 COPD still smoking Problem #4 new abnormalities on the CT scan done the other day while admitted for pneumonia Problem #5 CHF with ejection fraction of approximately 10 to 15% Very pleasant gentleman 78 years old. 2010 he had concomitant cancer the lung and trachea operated on several months apart. He also radiation therapy to the trachea it appears of those notes are not available to me. He was admitted the other day for pneumonia found to have an abnormality or 2 or 3 in the right lung worrisome for recurrent disease. He had a medial spinal lymph node mildly enlarged but he also had pneumonia.  His weight is stable and his appetite is excellent. He is not coughing up blood. He denies any changes in his breathing since she's been in the hospital he is clearly breathing better he states. He is accompanied by his wife of 26 years and his mother-in-law. He has one son in the [employer]	[place] in [place] by his present wife and 4 other children by previous relationships.  He does not drink alcohol. He is not aware of headaches nausea vomiting. He has no trouble swallowing his food. Vital signs are recorded. He is a thin gentleman no acute distress. He speaks very clearly with his voice box. Tracheostomy opening is clear and clean in appearance. He has no lymphadenopathy in any location. Lungs show markedly diminished breath sounds. He has no obvious rub or rales. He has a lipoma over his right mid back proximal a 3-1/2-4 cm across. Heart shows a regular rhythm and rate without distinct S3 gallop interestingly. He has no gynecomastia. He has pectus excavatum. He has no hepatosplenomegaly. He has no arm or leg edema presently. His left eye is scarred from prior trauma many years ago. The right eye is also status  post cataract procedure. Otherwise facial symmetry appears intact. He has upper and lower dental plates but his throat is clear tongue is normal and in the midline without obvious lesions.  He is right handed. Follows all commands readily.  He needs a PET scan and then to see [place] back. How much we can or cannot do will depend on what we see etc.

## 2012-02-10 NOTE — Patient Instructions (Addendum)
.  [facility] Discharge Instructions  RECOMMENDATIONS MADE BY THE CONSULTANT AND ANY TEST RESULTS WILL BE SENT TO YOUR REFERRING PHYSICIAN.  EXAM FINDINGS BY THE PHYSICIAN TODAY AND SIGNS OR SYMPTOMS TO REPORT TO CLINIC OR PRIMARY PHYSICIAN: Exam per Dr. Mariel Sleet We need to get pet scan to see if this is really cancer.   INSTRUCTIONS GIVEN AND DISCUSSED: You will have the pet scan next Friday. Nothing to eat or drink after midnight the night before and cut back on your sugars. The number to reschedule the pet is [phone].  SPECIAL INSTRUCTIONS/FOLLOW-UP:  we will see you back after the pet scan  Thank you for choosing [facility] to provide your oncology and hematology care.  To afford each patient quality time with our providers, please arrive at least 15 minutes before your scheduled appointment time.  With your help, our goal is to use those 15 minutes to complete the necessary work-up to ensure our physicians have the information they need to help with your evaluation and healthcare recommendations.    Effective January 1st, 2014, we ask that you re-schedule your appointment with our physicians should you arrive 10 or more minutes late for your appointment.  We strive to give you quality time with our providers, and arriving late affects you and other patients whose appointments are after yours.    Again, thank you for choosing [facility].  Our hope is that these requests will decrease the amount of time that you wait before being seen by our physicians.       _____________________________________________________________  Should you have questions after your visit to [facility], please contact our office at [phone] between the hours of 8:30 a.m. and 5:00 p.m.  Voicemails left after 4:30 p.m. will not be returned until the following business day.  For prescription refill requests, have your pharmacy contact our office  with your prescription refill request.

## 2012-02-11 LAB — CEA: CEA: 1.3 ng/mL (ref 0.0–5.0)

## 2012-02-17 ENCOUNTER — Encounter (HOSPITAL_COMMUNITY)
Admission: RE | Admit: 2012-02-17 | Discharge: 2012-02-17 | Disposition: A | Discharge status: Home / Self Care | Payer: Managed Care, Other (non HMO) | Source: Ambulatory Visit | Attending: Oncology | Admitting: Oncology

## 2012-02-17 ENCOUNTER — Encounter (HOSPITAL_COMMUNITY): Payer: Self-pay

## 2012-02-17 DIAGNOSIS — C329 Malignant neoplasm of larynx, unspecified: Secondary | ICD-10-CM | POA: Insufficient documentation

## 2012-02-17 DIAGNOSIS — C3490 Malignant neoplasm of unspecified part of unspecified bronchus or lung: Secondary | ICD-10-CM | POA: Insufficient documentation

## 2012-02-17 DIAGNOSIS — N1330 Unspecified hydronephrosis: Secondary | ICD-10-CM | POA: Insufficient documentation

## 2012-02-17 DIAGNOSIS — Z930 Tracheostomy status: Secondary | ICD-10-CM | POA: Insufficient documentation

## 2012-02-17 DIAGNOSIS — R918 Other nonspecific abnormal finding of lung field: Secondary | ICD-10-CM | POA: Insufficient documentation

## 2012-02-17 MED ORDER — FLUDEOXYGLUCOSE F - 18 (FDG) INJECTION
17.0000 | Freq: Once | INTRAVENOUS | Status: AC | PRN
  Administered 2012-02-17: 17 via INTRAVENOUS

## 2012-02-20 ENCOUNTER — Encounter (HOSPITAL_COMMUNITY): Payer: Managed Care, Other (non HMO) | Attending: Oncology | Admitting: Oncology

## 2012-02-20 VITALS — BP 99/68 | HR 83 | Temp 98.7°F | Resp 20

## 2012-02-20 DIAGNOSIS — C329 Malignant neoplasm of larynx, unspecified: Secondary | ICD-10-CM

## 2012-02-20 DIAGNOSIS — Z09 Encounter for follow-up examination after completed treatment for conditions other than malignant neoplasm: Secondary | ICD-10-CM | POA: Insufficient documentation

## 2012-02-20 DIAGNOSIS — C3410 Malignant neoplasm of upper lobe, unspecified bronchus or lung: Secondary | ICD-10-CM

## 2012-02-20 DIAGNOSIS — J449 Chronic obstructive pulmonary disease, unspecified: Secondary | ICD-10-CM | POA: Insufficient documentation

## 2012-02-20 DIAGNOSIS — Z85118 Personal history of other malignant neoplasm of bronchus and lung: Secondary | ICD-10-CM | POA: Insufficient documentation

## 2012-02-20 DIAGNOSIS — I509 Heart failure, unspecified: Secondary | ICD-10-CM | POA: Insufficient documentation

## 2012-02-20 DIAGNOSIS — IMO0001 Reserved for inherently not codable concepts without codable children: Secondary | ICD-10-CM

## 2012-02-20 DIAGNOSIS — C779 Secondary and unspecified malignant neoplasm of lymph node, unspecified: Secondary | ICD-10-CM

## 2012-02-20 DIAGNOSIS — F17200 Nicotine dependence, unspecified, uncomplicated: Secondary | ICD-10-CM | POA: Insufficient documentation

## 2012-02-20 DIAGNOSIS — Z8521 Personal history of malignant neoplasm of larynx: Secondary | ICD-10-CM | POA: Insufficient documentation

## 2012-02-20 DIAGNOSIS — J4489 Other specified chronic obstructive pulmonary disease: Secondary | ICD-10-CM | POA: Insufficient documentation

## 2012-02-20 NOTE — Patient Instructions (Addendum)
[facility] Discharge Instructions  RECOMMENDATIONS MADE BY THE CONSULTANT AND ANY TEST RESULTS WILL BE SENT TO YOUR REFERRING PHYSICIAN.  EXAM FINDINGS BY THE PHYSICIAN TODAY AND SIGNS OR SYMPTOMS TO REPORT TO CLINIC OR PRIMARY PHYSICIAN: Discussion by MD.  Your scan shows 2 different areas and they are probably squamous cell cancer.  We can't cure you but chemotherapy has about 20 - 45 % chance of responding.  You have time to think about what you want to do.  Call us with your decision.  Tobie Lords, RN  [phone])  MEDICATIONS PRESCRIBED:  none  INSTRUCTIONS GIVEN AND DISCUSSED: Report uncontrolled pain, shortness of breath or other problems.  SPECIAL INSTRUCTIONS/FOLLOW-UP: Follow-up in 6 - 7 weeks.  Thank you for choosing [facility] to provide your oncology and hematology care.  To afford each patient quality time with our providers, please arrive at least 15 minutes before your scheduled appointment time.  With your help, our goal is to use those 15 minutes to complete the necessary work-up to ensure our physicians have the information they need to help with your evaluation and healthcare recommendations.    Effective January 1st, 2014, we ask that you re-schedule your appointment with our physicians should you arrive 10 or more minutes late for your appointment.  We strive to give you quality time with our providers, and arriving late affects you and other patients whose appointments are after yours.    Again, thank you for choosing [facility].  Our hope is that these requests will decrease the amount of time that you wait before being seen by our physicians.       _____________________________________________________________  Should you have questions after your visit to [facility], please contact our office at [phone] between the hours of 8:30 a.m. and 5:00 p.m.  Voicemails left after 4:30 p.m. will not be  returned until the following business day.  For prescription refill requests, have your pharmacy contact our office with your prescription refill request.

## 2012-02-20 NOTE — Progress Notes (Signed)
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 He now has apparent metastatic disease to both lungs and a lymph node in the mediastinum by PET scan criteria. The largest lesion is 10 mm. This would be difficult to biopsy but his PET scan is clearly consistent with recurrent metastatic disease rather than infection. Therefore I do not think a biopsy is absolutely necessary since both of his cancers previously were squamous cell carcinomas. He is therefore a candidate in my opinion for palliative chemotherapy with carboplatinum and paclitaxel if he so desires. After long discussion about the side effects, the potential for good versus lack of efficacy, etc. he and his wife who asked many good questions over this 25 minute period of time would like to think about things before committing to therapy.  He knows that therapy is not curative. He will get back in touch with [place] when he has made his decision. Her therapy will be every 21 days for 3 cycles and then a repeat scan would need be done. That could potentially be a CAT scan or PET scan.

## 2012-02-21 ENCOUNTER — Ambulatory Visit (HOSPITAL_COMMUNITY): Payer: Medicare Other | Admitting: Oncology

## 2012-04-02 ENCOUNTER — Encounter (HOSPITAL_COMMUNITY): Payer: Managed Care, Other (non HMO) | Attending: Oncology | Admitting: Oncology

## 2012-04-02 ENCOUNTER — Encounter (HOSPITAL_COMMUNITY): Payer: Self-pay | Admitting: Oncology

## 2012-04-02 VITALS — BP 113/73 | HR 87 | Resp 20 | Wt 135.6 lb

## 2012-04-02 DIAGNOSIS — R918 Other nonspecific abnormal finding of lung field: Secondary | ICD-10-CM | POA: Insufficient documentation

## 2012-04-02 DIAGNOSIS — J984 Other disorders of lung: Secondary | ICD-10-CM

## 2012-04-02 NOTE — Progress Notes (Signed)
Problem #1 recurrent cancer of the lung versus recurrent cancer of the larynx, both resected in 2010, and now with 3 lesions in the lungs, 2 on the right and one on the left. No other disease was seen on his PET scan. He has gained to 3 pounds since your last. Appetite remains excellent. He denies pulmonary symptomatology that is new or different.  Of course his cardiomyopathy would be a challenge with chemotherapy but I do suspect he could tolerate carboplatin and Taxol potentially.  He would like to know how fast these lesions are growing and would like to wait on chemotherapy decision making until then. Therefore we can schedule him with a CAT scan in April and see him back for discussion after comparing the 2 scans in January and April. He would like to do that before deciding on any further therapy. He denies any pain presently.

## 2012-04-02 NOTE — Patient Instructions (Addendum)
[facility] Discharge Instructions  RECOMMENDATIONS MADE BY THE CONSULTANT AND ANY TEST RESULTS WILL BE SENT TO YOUR REFERRING PHYSICIAN.  EXAM FINDINGS BY THE PHYSICIAN TODAY AND SIGNS OR SYMPTOMS TO REPORT TO CLINIC OR PRIMARY PHYSICIAN: Exam and discussion by MD.  Continue eating well.  MEDICATIONS PRESCRIBED:  none  INSTRUCTIONS GIVEN AND DISCUSSED: Report pain, shortness of breath or other problems.  SPECIAL INSTRUCTIONS/FOLLOW-UP: Blood work and CTs in April and to see MD the next day.  Thank you for choosing [facility] to provide your oncology and hematology care.  To afford each patient quality time with our providers, please arrive at least 15 minutes before your scheduled appointment time.  With your help, our goal is to use those 15 minutes to complete the necessary work-up to ensure our physicians have the information they need to help with your evaluation and healthcare recommendations.    Effective January 1st, 2014, we ask that you re-schedule your appointment with our physicians should you arrive 10 or more minutes late for your appointment.  We strive to give you quality time with our providers, and arriving late affects you and other patients whose appointments are after yours.    Again, thank you for choosing [facility].  Our hope is that these requests will decrease the amount of time that you wait before being seen by our physicians.       _____________________________________________________________  Should you have questions after your visit to [facility], please contact our office at [phone] between the hours of 8:30 a.m. and 5:00 p.m.  Voicemails left after 4:30 p.m. will not be returned until the following business day.  For prescription refill requests, have your pharmacy contact our office with your prescription refill request.

## 2012-04-17 ENCOUNTER — Other Ambulatory Visit (HOSPITAL_COMMUNITY): Payer: Self-pay | Admitting: Urology

## 2012-04-17 DIAGNOSIS — N400 Enlarged prostate without lower urinary tract symptoms: Secondary | ICD-10-CM

## 2012-04-19 ENCOUNTER — Ambulatory Visit (HOSPITAL_COMMUNITY): Payer: Managed Care, Other (non HMO) | Attending: Urology

## 2012-04-24 ENCOUNTER — Ambulatory Visit (HOSPITAL_COMMUNITY)
Admit: 2012-04-24 | Discharge: 2012-04-24 | Disposition: A | Discharge status: Home / Self Care | Payer: Managed Care, Other (non HMO) | Source: Ambulatory Visit | Attending: Oncology | Admitting: Oncology

## 2012-04-24 ENCOUNTER — Encounter (HOSPITAL_COMMUNITY): Payer: Self-pay

## 2012-04-24 ENCOUNTER — Encounter (HOSPITAL_COMMUNITY): Payer: Managed Care, Other (non HMO) | Attending: Oncology

## 2012-04-24 DIAGNOSIS — C7800 Secondary malignant neoplasm of unspecified lung: Secondary | ICD-10-CM | POA: Insufficient documentation

## 2012-04-24 DIAGNOSIS — Z8521 Personal history of malignant neoplasm of larynx: Secondary | ICD-10-CM | POA: Insufficient documentation

## 2012-04-24 DIAGNOSIS — R918 Other nonspecific abnormal finding of lung field: Secondary | ICD-10-CM

## 2012-04-24 DIAGNOSIS — C3490 Malignant neoplasm of unspecified part of unspecified bronchus or lung: Secondary | ICD-10-CM | POA: Insufficient documentation

## 2012-04-24 LAB — CBC WITH DIFFERENTIAL/PLATELET
Eosinophils Absolute: 0.3 10*3/uL (ref 0.0–0.7)
Eosinophils Relative: 7 % — ABNORMAL HIGH (ref 0–5)
HCT: 40.5 % (ref 39.0–52.0)
Hemoglobin: 13.7 g/dL (ref 13.0–17.0)
Lymphs Abs: 0.7 10*3/uL (ref 0.7–4.0)
MCH: 30.6 pg (ref 26.0–34.0)
MCV: 90.4 fL (ref 78.0–100.0)
Monocytes Relative: 8 % (ref 3–12)
RBC: 4.48 MIL/uL (ref 4.22–5.81)

## 2012-04-24 LAB — COMPREHENSIVE METABOLIC PANEL
Alkaline Phosphatase: 67 U/L (ref 39–117)
BUN: 18 mg/dL (ref 6–23)
CO2: 26 mEq/L (ref 19–32)
Calcium: 8.7 mg/dL (ref 8.4–10.5)
GFR calc Af Amer: 59 mL/min — ABNORMAL LOW (ref >90)
GFR calc non Af Amer: 51 mL/min — ABNORMAL LOW (ref >90)
Glucose, Bld: 119 mg/dL — ABNORMAL HIGH (ref 70–99)
Total Protein: 7.1 g/dL (ref 6.0–8.3)

## 2012-04-24 MED ORDER — IOHEXOL 300 MG/ML  SOLN
80.0000 mL | Freq: Once | INTRAMUSCULAR | Status: AC | PRN
  Administered 2012-04-24: 80 mL via INTRAVENOUS

## 2012-04-24 NOTE — Progress Notes (Signed)
Labs drawn today for cbc/diff,cmp 

## 2012-04-25 ENCOUNTER — Encounter (HOSPITAL_BASED_OUTPATIENT_CLINIC_OR_DEPARTMENT_OTHER): Payer: Managed Care, Other (non HMO) | Admitting: Oncology

## 2012-04-25 VITALS — BP 114/76 | HR 72 | Temp 98.1°F | Resp 16 | Wt 136.5 lb

## 2012-04-25 DIAGNOSIS — C329 Malignant neoplasm of larynx, unspecified: Secondary | ICD-10-CM

## 2012-04-25 DIAGNOSIS — C3410 Malignant neoplasm of upper lobe, unspecified bronchus or lung: Secondary | ICD-10-CM

## 2012-04-25 DIAGNOSIS — R918 Other nonspecific abnormal finding of lung field: Secondary | ICD-10-CM

## 2012-04-25 NOTE — Patient Instructions (Addendum)
[facility] Discharge Instructions  RECOMMENDATIONS MADE BY THE CONSULTANT AND ANY TEST RESULTS WILL BE SENT TO YOUR REFERRING PHYSICIAN.  EXAM FINDINGS BY THE PHYSICIAN TODAY AND SIGNS OR SYMPTOMS TO REPORT TO CLINIC OR PRIMARY PHYSICIAN: Exam and discussion by MD.  You scan showed that the areas on the right lung have not changed much but the one in the left lung has increased in size.  We will recheck it in a few weeks.  MEDICATIONS PRESCRIBED:  none  INSTRUCTIONS GIVEN AND DISCUSSED: Report increased shortness of breath, increased fatigue, uncontrolled pain,etc.  SPECIAL INSTRUCTIONS/FOLLOW-UP: See Schedule.  Thank you for choosing [facility] to provide your oncology and hematology care.  To afford each patient quality time with our providers, please arrive at least 15 minutes before your scheduled appointment time.  With your help, our goal is to use those 15 minutes to complete the necessary work-up to ensure our physicians have the information they need to help with your evaluation and healthcare recommendations.    Effective January 1st, 2014, we ask that you re-schedule your appointment with our physicians should you arrive 10 or more minutes late for your appointment.  We strive to give you quality time with our providers, and arriving late affects you and other patients whose appointments are after yours.    Again, thank you for choosing [facility].  Our hope is that these requests will decrease the amount of time that you wait before being seen by our physicians.       _____________________________________________________________  Should you have questions after your visit to [facility], please contact our office at [phone] between the hours of 8:30 a.m. and 5:00 p.m.  Voicemails left after 4:30 p.m. will not be returned until the following business day.  For prescription refill requests, have your pharmacy  contact our office with your prescription refill request.

## 2012-04-25 NOTE — Progress Notes (Signed)
Problem #1 recurrent cancer the lung versus recurrent cancer the larynx, both resected in 2010 now with 3 pulmonary nodules all of which are increasing in size. 2 are in the right lung and one is in the left. The one in the left lung has grown the most. He remains asymptomatic. Weight is stable appetite is excellent fact his weight is up another pound. He is not coughing up blood etc.  #2 cardiomyopathy. I think he could tolerate carboplatinum and Taxol. He had squamous cell carcinomas in the past. He would not be a candidate for pemetrexed. I think we could try the way to treatment since he I tolerate some. If he does I would 3 cycles reevaluated with CAT scan and if he tolerates them, then try 3 more cycles for a total of 6 cycles and then give him a break in therapy.  After a long discussion he wants to wait at this time and just see [place] back in early June with repeat CAT scan and some blood work and he'll make a decision at that time. If He changes his mind in the interim then he or his wife will be in contact with [place]. He would of course need a Port-A-Cath at some point if we treat him.

## 2012-05-02 ENCOUNTER — Ambulatory Visit (HOSPITAL_COMMUNITY)
Admission: RE | Admit: 2012-05-02 | Discharge: 2012-05-02 | Disposition: A | Discharge status: Home / Self Care | Payer: Managed Care, Other (non HMO) | Source: Ambulatory Visit | Attending: Urology | Admitting: Urology

## 2012-05-02 DIAGNOSIS — N400 Enlarged prostate without lower urinary tract symptoms: Secondary | ICD-10-CM

## 2012-05-02 DIAGNOSIS — N1330 Unspecified hydronephrosis: Secondary | ICD-10-CM | POA: Insufficient documentation

## 2012-05-07 ENCOUNTER — Encounter: Payer: Self-pay | Admitting: Cardiovascular Disease

## 2012-05-07 ENCOUNTER — Ambulatory Visit (INDEPENDENT_AMBULATORY_CARE_PROVIDER_SITE_OTHER): Payer: Managed Care, Other (non HMO) | Admitting: Cardiovascular Disease

## 2012-05-07 VITALS — BP 104/60 | HR 71 | Ht 67.0 in | Wt 139.0 lb

## 2012-05-07 DIAGNOSIS — I428 Other cardiomyopathies: Secondary | ICD-10-CM

## 2012-05-07 DIAGNOSIS — Z85118 Personal history of other malignant neoplasm of bronchus and lung: Secondary | ICD-10-CM

## 2012-05-07 DIAGNOSIS — N1330 Unspecified hydronephrosis: Secondary | ICD-10-CM

## 2012-05-07 DIAGNOSIS — I429 Cardiomyopathy, unspecified: Secondary | ICD-10-CM

## 2012-05-07 NOTE — Assessment & Plan Note (Signed)
Will need to be referred to Dr Annabell Howells or one of the [facility] urologist.  Lurline Idol out infection Rx  History of hydronephrosis

## 2012-05-07 NOTE — Patient Instructions (Addendum)
Your physician wants you to follow-up in:  6 months. You will receive a reminder letter in the mail two months in advance. If you don't receive a letter, please call our office to schedule the follow-up appointment.   

## 2012-05-07 NOTE — Assessment & Plan Note (Signed)
Stable euvolemic on appr meds.  Decreased EF will limit choice of chemo but needs further Rx of lung CA

## 2012-05-07 NOTE — Progress Notes (Signed)
Patient ID: Jeffrey [name], male   DOB: May 02, 1933, 79 y.o.   MRN: [id number] 79 yo recently hospitalized with dyspnea and urinary retention. Echo done showed EF 15-20% Apparently Dr Dietrich Pates called and he indicated he would see patient as outpatient. Patient has mild chronic dyspnea from previous CA and left lung lobectomy. 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 No previous cardiac issues. No chest pain PND, orthopnea and no LE edema. Seems compensated. Low dose lisinopril started last visit    Reviewed echo from 1/14Study Conclusions  - Left ventricle: The cavity size was mildly dilated. Wall thickness was normal. Systolic function was severely reduced. The estimated ejection fraction was in the range of 15% to 20%. Severe globalhypokinesis to akinesis except the basilar inferolateral segment, which is moderately hypokinetic. - Aortic valve: Moderately calcified annulus. Possibly bicuspid; mildly thickened leaflets. Thickening. - Mitral valve: Mild regurgitation. - Left atrium: The atrium was mildly dilated. - Right ventricle: The cavity size was mildly dilated. Systolic function was moderately reduced. Systolic pressure was moderately increased. - Atrial septum: No defect or patent foramen ovale was identified. - Tricuspid valve: Mild-moderate regurgitation. - Pulmonary arteries: PA peak pressure: 58mm Hg (S). - Pericardium, extracardiac: A trivial pericardial effusion was identified posterior to the heart. There was a moderate-sized right pleural effusion. There was a moderate-sized left pleural effusion.  CT 04/02/12 reveiwed and rapid progression of mets on both left and right lung  To see Dr Mariel Sleet soon.  Has had bad experience with Dr Frann Rider and wants a new Urologist Has had hydronephrosis and foley finally out   ROS: Denies fever, malais, weight loss, blurry vision, decreased visual acuity, cough, sputum, SOB, hemoptysis, pleuritic pain, palpitaitons,  heartburn, abdominal pain, melena, lower extremity edema, claudication, or rash.  All other systems reviewed and negative  General: Affect appropriate Chronically ill thin male  HEENT: tracheostomy Neck supple with no adenopathy JVP normal no bruits no thyromegaly Lungs expitory  wheezing and good diaphragmatic motion Heart:  S1/S2 no murmur, no rub, gallop or click PMI normal Abdomen: benighn, BS positve, no tenderness, no AAA no bruit.  No HSM or HJR Distal pulses intact with no bruits No edema Neuro non-focal Skin warm and dry No muscular weakness   Current Outpatient Prescriptions  Medication Sig Dispense Refill  . aspirin EC 81 MG EC tablet Take 1 tablet (81 mg total) by mouth daily.  30 tablet  1  . carvedilol (COREG) 3.125 MG tablet Take 1 tablet (3.125 mg total) by mouth 2 (two) times daily with a meal.  60 tablet  1  . HYDROcodone-acetaminophen (NORCO/VICODIN) 5-325 MG per tablet Take 0.25-0.5 tablets by mouth 2 (two) times daily as needed. *Must last 30 days*      . lisinopril (PRINIVIL,ZESTRIL) 5 MG tablet Take 1 tablet (5 mg total) by mouth daily.  30 tablet  6  . LORazepam (ATIVAN) 1 MG tablet Take 1 mg by mouth at bedtime as needed. For sleep      . tamsulosin (FLOMAX) 0.4 MG CAPS Take 0.04 mg by mouth daily.       No current facility-administered medications for this visit.    Allergies  Review of patient's allergies indicates no known allergies.  Electrocardiogram:  1.8/14  SR rate 103  LAD   Assessment and Plan

## 2012-05-07 NOTE — Assessment & Plan Note (Signed)
Rapidly progressive meds on last CT  F/U oncology for Rx plan.  Avoid chemo agents with cardiotoxic effects

## 2012-06-12 ENCOUNTER — Other Ambulatory Visit: Payer: Self-pay | Admitting: *Deleted

## 2012-06-12 MED ORDER — CARVEDILOL 3.125 MG PO TABS: 3.1250 mg | ORAL_TABLET | Freq: Two times a day (BID) | ORAL | Status: DC

## 2012-06-19 ENCOUNTER — Encounter (HOSPITAL_COMMUNITY): Payer: Managed Care, Other (non HMO) | Attending: Oncology | Admitting: Oncology

## 2012-06-19 ENCOUNTER — Encounter (HOSPITAL_COMMUNITY): Payer: Self-pay | Admitting: Oncology

## 2012-06-19 ENCOUNTER — Ambulatory Visit (HOSPITAL_COMMUNITY)
Admission: RE | Admit: 2012-06-19 | Discharge: 2012-06-19 | Disposition: A | Discharge status: Home / Self Care | Payer: Managed Care, Other (non HMO) | Source: Ambulatory Visit | Attending: Oncology | Admitting: Oncology

## 2012-06-19 ENCOUNTER — Encounter (HOSPITAL_BASED_OUTPATIENT_CLINIC_OR_DEPARTMENT_OTHER): Payer: Managed Care, Other (non HMO)

## 2012-06-19 VITALS — BP 111/70 | HR 77 | Temp 98.2°F | Resp 18 | Wt 137.9 lb

## 2012-06-19 DIAGNOSIS — C7800 Secondary malignant neoplasm of unspecified lung: Secondary | ICD-10-CM | POA: Insufficient documentation

## 2012-06-19 DIAGNOSIS — C3410 Malignant neoplasm of upper lobe, unspecified bronchus or lung: Secondary | ICD-10-CM

## 2012-06-19 DIAGNOSIS — Z09 Encounter for follow-up examination after completed treatment for conditions other than malignant neoplasm: Secondary | ICD-10-CM | POA: Insufficient documentation

## 2012-06-19 DIAGNOSIS — R918 Other nonspecific abnormal finding of lung field: Secondary | ICD-10-CM | POA: Insufficient documentation

## 2012-06-19 DIAGNOSIS — C329 Malignant neoplasm of larynx, unspecified: Secondary | ICD-10-CM

## 2012-06-19 DIAGNOSIS — Z8521 Personal history of malignant neoplasm of larynx: Secondary | ICD-10-CM | POA: Insufficient documentation

## 2012-06-19 DIAGNOSIS — C3490 Malignant neoplasm of unspecified part of unspecified bronchus or lung: Secondary | ICD-10-CM

## 2012-06-19 LAB — COMPREHENSIVE METABOLIC PANEL
ALT: 9 U/L (ref 0–53)
AST: 19 U/L (ref 0–37)
Albumin: 3.8 g/dL (ref 3.5–5.2)
Alkaline Phosphatase: 59 U/L (ref 39–117)
CO2: 29 mEq/L (ref 19–32)
Chloride: 99 mEq/L (ref 96–112)
Creatinine, Ser: 1.47 mg/dL — ABNORMAL HIGH (ref 0.50–1.35)
GFR calc non Af Amer: 45 mL/min — ABNORMAL LOW (ref >90)
Potassium: 4.2 mEq/L (ref 3.5–5.1)
Total Bilirubin: 0.3 mg/dL (ref 0.3–1.2)

## 2012-06-19 LAB — CBC WITH DIFFERENTIAL/PLATELET
Basophils Absolute: 0 10*3/uL (ref 0.0–0.1)
Basophils Relative: 1 % (ref 0–1)
HCT: 40.5 % (ref 39.0–52.0)
Hemoglobin: 14.2 g/dL (ref 13.0–17.0)
Lymphocytes Relative: 15 % (ref 12–46)
Monocytes Absolute: 0.4 10*3/uL (ref 0.1–1.0)
Neutro Abs: 2.6 10*3/uL (ref 1.7–7.7)
Neutrophils Relative %: 67 % (ref 43–77)
RDW: 13.9 % (ref 11.5–15.5)
WBC: 3.9 10*3/uL — ABNORMAL LOW (ref 4.0–10.5)

## 2012-06-19 NOTE — Patient Instructions (Addendum)
[facility] Discharge Instructions  RECOMMENDATIONS MADE BY THE CONSULTANT AND ANY TEST RESULTS WILL BE SENT TO YOUR REFERRING PHYSICIAN.  EXAM FINDINGS BY THE PHYSICIAN TODAY AND SIGNS OR SYMPTOMS TO REPORT TO CLINIC OR PRIMARY PHYSICIAN: Discussion by MD.  Lisabeth Devoid it would be good to try chemotherapy while you are doing well.  Can try one treatment and if you don't want to continue we'll stop.  Need to get a port a cath placed and an echocardiogram before we start.  We will contact Dr. Zenaida Niece Tright's office and get a consult for a port a catheter placement.  We can start your therapy after July 4.  We will also get scheduled for chemotherapy teaching with Rosana Berger our nurse navigator.  MEDICATIONS PRESCRIBED:  none  INSTRUCTIONS GIVEN AND DISCUSSED: Report unusual weight loss, cough, blood in sputum,etc.  SPECIAL INSTRUCTIONS/FOLLOW-UP: To see PA in 5 weeks.  Thank you for choosing [facility] to provide your oncology and hematology care.  To afford each patient quality time with our providers, please arrive at least 15 minutes before your scheduled appointment time.  With your help, our goal is to use those 15 minutes to complete the necessary work-up to ensure our physicians have the information they need to help with your evaluation and healthcare recommendations.    Effective January 1st, 2014, we ask that you re-schedule your appointment with our physicians should you arrive 10 or more minutes late for your appointment.  We strive to give you quality time with our providers, and arriving late affects you and other patients whose appointments are after yours.    Again, thank you for choosing [facility].  Our hope is that these requests will decrease the amount of time that you wait before being seen by our physicians.       _____________________________________________________________  Should you have questions after your visit to [facility], please contact our office at [phone] between the hours of 8:30 a.m. and 5:00 p.m.  Voicemails left after 4:30 p.m. will not be returned until the following business day.  For prescription refill requests, have your pharmacy contact our office with your prescription refill request.

## 2012-06-19 NOTE — Progress Notes (Signed)
Labs drawn today for cbc/diff,cmp 

## 2012-06-19 NOTE — Progress Notes (Signed)
Problem #1 recurrent metastatic disease consistent with either cancer longer cancer the larynx, both resected in 2010 now with multiple pulmonary nodules all consistent with metastatic disease. He denies shortness of breath chest pain or cough. His weight is still fairly stable.  #2 cardiomyopathy with a decreased ejection fraction in the 15-20% range area   I think a biopsy is a moot point at this juncture. It would also be somewhat risky if he drops his lung.  He has progression of these lung nodules. I think it is worth considering palliative chemotherapy in the form of carboplatin and Taxol. We did try one cycle and see how he does. If he tolerates it I would treat him with 3 cycles reduce CAT scan to see if he has a response.  I think we potentially could give him another 2 months but I would rather treat him when he is feeling good rather than should he get a postobstructive pneumonia especially from the largest lesion on the left lung close to the hilum.  His family and he want to proceed. We will try to get Dr. Donata Clay to place a Port-A-Cath. The patient wants to wait till right after July 4 to start therapy which is fine with me.

## 2012-06-21 ENCOUNTER — Ambulatory Visit (HOSPITAL_COMMUNITY)
Admission: RE | Admit: 2012-06-21 | Discharge: 2012-06-21 | Disposition: A | Discharge status: Home / Self Care | Payer: Managed Care, Other (non HMO) | Source: Ambulatory Visit | Attending: Oncology | Admitting: Oncology

## 2012-06-21 ENCOUNTER — Other Ambulatory Visit: Payer: Self-pay | Admitting: *Deleted

## 2012-06-21 DIAGNOSIS — J4489 Other specified chronic obstructive pulmonary disease: Secondary | ICD-10-CM | POA: Insufficient documentation

## 2012-06-21 DIAGNOSIS — R918 Other nonspecific abnormal finding of lung field: Secondary | ICD-10-CM

## 2012-06-21 DIAGNOSIS — I509 Heart failure, unspecified: Secondary | ICD-10-CM | POA: Insufficient documentation

## 2012-06-21 DIAGNOSIS — F17200 Nicotine dependence, unspecified, uncomplicated: Secondary | ICD-10-CM | POA: Insufficient documentation

## 2012-06-21 DIAGNOSIS — J449 Chronic obstructive pulmonary disease, unspecified: Secondary | ICD-10-CM | POA: Insufficient documentation

## 2012-06-21 DIAGNOSIS — I059 Rheumatic mitral valve disease, unspecified: Secondary | ICD-10-CM

## 2012-06-21 DIAGNOSIS — C3490 Malignant neoplasm of unspecified part of unspecified bronchus or lung: Secondary | ICD-10-CM

## 2012-06-21 NOTE — Progress Notes (Signed)
*  PRELIMINARY RESULTS* Echocardiogram 2D Echocardiogram has been performed.  Jeffrey [name] 06/21/2012, 2:42 PM

## 2012-07-02 ENCOUNTER — Other Ambulatory Visit: Payer: Self-pay | Admitting: *Deleted

## 2012-07-03 ENCOUNTER — Encounter (HOSPITAL_COMMUNITY): Payer: Self-pay | Admitting: Pharmacy Technician

## 2012-07-05 NOTE — Pre-Procedure Instructions (Signed)
Jeffrey [name]  07/05/2012   Your procedure is scheduled on:  Thurs, June 26 @ 2:50 PM  Report to [facility] at 8:30 AM.  Call this number if you have problems the morning of surgery: [phone]   Remember:   Do not eat food or drink liquids after midnight.   Take these medicines the morning of surgery with A SIP OF WATER: Carvedilol(Coreg),Pain Pill(if needed),and Flomax(Tamsulosin)   Do not wear jewelry  Do not wear lotions, powders, or colognes. You may wear deodorant.  Men may shave face and neck.  Do not bring valuables to the hospital.  [facility] is not responsible                   for any belongings or valuables.  Contacts, dentures or bridgework may not be worn into surgery.  Leave suitcase in the car. After surgery it may be brought to your room.  For patients admitted to the hospital, checkout time is 11:00 AM the day of  discharge.   Patients discharged the day of surgery will not be allowed to drive  home.    Special Instructions: Shower using CHG 2 nights before surgery and the night before surgery.  If you shower the day of surgery use CHG.  Use special wash - you have one bottle of CHG for all showers.  You should use approximately 1/3 of the bottle for each shower.   Please read over the following fact sheets that you were given: Pain Management,Infection Prevention,Coughing and Deep Breathing

## 2012-07-06 ENCOUNTER — Encounter (HOSPITAL_COMMUNITY)
Admission: RE | Admit: 2012-07-06 | Discharge: 2012-07-06 | Disposition: A | Discharge status: Home / Self Care | Payer: Managed Care, Other (non HMO) | Source: Ambulatory Visit | Attending: Cardiothoracic Surgery | Admitting: Cardiothoracic Surgery

## 2012-07-06 ENCOUNTER — Encounter (HOSPITAL_COMMUNITY): Payer: Self-pay

## 2012-07-06 VITALS — BP 139/84 | HR 75 | Temp 98.0°F | Resp 18 | Ht 64.0 in | Wt 138.0 lb

## 2012-07-06 DIAGNOSIS — R918 Other nonspecific abnormal finding of lung field: Secondary | ICD-10-CM

## 2012-07-06 HISTORY — DX: Gastric ulcer, unspecified as acute or chronic, without hemorrhage or perforation: K25.9

## 2012-07-06 HISTORY — DX: Benign prostatic hyperplasia without lower urinary tract symptoms: N40.0

## 2012-07-06 HISTORY — DX: Umbilical hernia without obstruction or gangrene: K42.9

## 2012-07-06 HISTORY — DX: Urgency of urination: R39.15

## 2012-07-06 HISTORY — DX: Shortness of breath: R06.02

## 2012-07-06 HISTORY — DX: Cardiac arrhythmia, unspecified: I49.9

## 2012-07-06 HISTORY — DX: Personal history of other medical treatment: Z92.89

## 2012-07-06 LAB — COMPREHENSIVE METABOLIC PANEL
ALT: 9 U/L (ref 0–53)
AST: 16 U/L (ref 0–37)
Albumin: 3.8 g/dL (ref 3.5–5.2)
Alkaline Phosphatase: 62 U/L (ref 39–117)
BUN: 16 mg/dL (ref 6–23)
CO2: 27 mEq/L (ref 19–32)
Calcium: 9.3 mg/dL (ref 8.4–10.5)
Chloride: 102 mEq/L (ref 96–112)
Creatinine, Ser: 1.34 mg/dL (ref 0.50–1.35)
GFR calc Af Amer: 58 mL/min — ABNORMAL LOW (ref >90)
GFR calc non Af Amer: 50 mL/min — ABNORMAL LOW (ref >90)
Glucose, Bld: 90 mg/dL (ref 70–99)
Potassium: 5.1 mEq/L (ref 3.5–5.1)
Sodium: 138 mEq/L (ref 135–145)
Total Bilirubin: 0.3 mg/dL (ref 0.3–1.2)
Total Protein: 7.6 g/dL (ref 6.0–8.3)

## 2012-07-06 LAB — CBC
HCT: 40.4 % (ref 39.0–52.0)
Hemoglobin: 13.9 g/dL (ref 13.0–17.0)
MCH: 31.4 pg (ref 26.0–34.0)
MCHC: 34.4 g/dL (ref 30.0–36.0)
MCV: 91.2 fL (ref 78.0–100.0)
Platelets: 176 10*3/uL (ref 150–400)
RBC: 4.43 MIL/uL (ref 4.22–5.81)
RDW: 13.8 % (ref 11.5–15.5)
WBC: 5.4 10*3/uL (ref 4.0–10.5)

## 2012-07-06 LAB — PROTIME-INR
INR: 0.97 (ref 0.00–1.49)
Prothrombin Time: 12.8 seconds (ref 11.6–15.2)

## 2012-07-06 LAB — SURGICAL PCR SCREEN
MRSA, PCR: NEGATIVE
Staphylococcus aureus: NEGATIVE

## 2012-07-06 LAB — APTT: aPTT: 36 seconds (ref 24–37)

## 2012-07-11 MED ORDER — CEFUROXIME SODIUM 1.5 G IJ SOLR
1.5000 g | INTRAMUSCULAR | Status: AC
  Administered 2012-07-12: 1.5 g via INTRAVENOUS
  Filled 2012-07-11: qty 1.5

## 2012-07-12 ENCOUNTER — Ambulatory Visit (HOSPITAL_COMMUNITY)
Admission: RE | Admit: 2012-07-12 | Discharge: 2012-07-12 | Disposition: A | Discharge status: Home Health | Payer: Managed Care, Other (non HMO) | Source: Ambulatory Visit | Attending: Cardiothoracic Surgery | Admitting: Cardiothoracic Surgery

## 2012-07-12 ENCOUNTER — Encounter (HOSPITAL_COMMUNITY)
Admission: RE | Disposition: A | Discharge status: Home Health | Payer: Self-pay | Source: Ambulatory Visit | Attending: Cardiothoracic Surgery

## 2012-07-12 ENCOUNTER — Ambulatory Visit (HOSPITAL_COMMUNITY): Payer: Managed Care, Other (non HMO)

## 2012-07-12 ENCOUNTER — Ambulatory Visit (HOSPITAL_COMMUNITY): Payer: Managed Care, Other (non HMO) | Admitting: Certified Registered"

## 2012-07-12 ENCOUNTER — Encounter (HOSPITAL_COMMUNITY): Payer: Self-pay | Admitting: Certified Registered"

## 2012-07-12 ENCOUNTER — Encounter (HOSPITAL_COMMUNITY): Payer: Self-pay | Admitting: *Deleted

## 2012-07-12 DIAGNOSIS — R918 Other nonspecific abnormal finding of lung field: Secondary | ICD-10-CM

## 2012-07-12 DIAGNOSIS — Z902 Acquired absence of lung [part of]: Secondary | ICD-10-CM | POA: Insufficient documentation

## 2012-07-12 DIAGNOSIS — J4489 Other specified chronic obstructive pulmonary disease: Secondary | ICD-10-CM | POA: Insufficient documentation

## 2012-07-12 DIAGNOSIS — C3490 Malignant neoplasm of unspecified part of unspecified bronchus or lung: Secondary | ICD-10-CM

## 2012-07-12 DIAGNOSIS — I428 Other cardiomyopathies: Secondary | ICD-10-CM | POA: Insufficient documentation

## 2012-07-12 DIAGNOSIS — I10 Essential (primary) hypertension: Secondary | ICD-10-CM | POA: Insufficient documentation

## 2012-07-12 DIAGNOSIS — I517 Cardiomegaly: Secondary | ICD-10-CM | POA: Insufficient documentation

## 2012-07-12 DIAGNOSIS — Z8711 Personal history of peptic ulcer disease: Secondary | ICD-10-CM | POA: Insufficient documentation

## 2012-07-12 DIAGNOSIS — Z930 Tracheostomy status: Secondary | ICD-10-CM | POA: Insufficient documentation

## 2012-07-12 DIAGNOSIS — J449 Chronic obstructive pulmonary disease, unspecified: Secondary | ICD-10-CM | POA: Insufficient documentation

## 2012-07-12 HISTORY — PX: PORTACATH PLACEMENT: SHX2246

## 2012-07-12 SURGERY — INSERTION, TUNNELED CENTRAL VENOUS DEVICE, WITH PORT
Anesthesia: Monitor Anesthesia Care | Site: Chest | Laterality: Bilateral

## 2012-07-12 MED ORDER — SODIUM CHLORIDE 0.9 % IR SOLN
Status: DC | PRN
  Administered 2012-07-12: 1000 mL

## 2012-07-12 MED ORDER — LACTATED RINGERS IV SOLN
INTRAVENOUS | Status: DC | PRN
  Administered 2012-07-12: 10:00:00 via INTRAVENOUS

## 2012-07-12 MED ORDER — HEPARIN SOD (PORK) LOCK FLUSH 100 UNIT/ML IV SOLN
INTRAVENOUS | Status: AC
  Filled 2012-07-12: qty 10

## 2012-07-12 MED ORDER — CARVEDILOL 3.125 MG PO TABS: 3.1250 mg | ORAL_TABLET | Freq: Once | ORAL | Status: AC

## 2012-07-12 MED ORDER — FENTANYL CITRATE 0.05 MG/ML IJ SOLN
INTRAMUSCULAR | Status: DC | PRN
  Administered 2012-07-12 (×3): 25 ug via INTRAVENOUS

## 2012-07-12 MED ORDER — CARVEDILOL 3.125 MG PO TABS
ORAL_TABLET | ORAL | Status: AC
  Administered 2012-07-12: 3.125 mg via ORAL
  Filled 2012-07-12: qty 1

## 2012-07-12 MED ORDER — SODIUM CHLORIDE 0.9 % IR SOLN
Status: DC | PRN
  Administered 2012-07-12: 10:00:00

## 2012-07-12 MED ORDER — HEPARIN SODIUM (PORCINE) 10000 UNIT/ML IJ SOLN
INTRAMUSCULAR | Status: DC | PRN
  Administered 2012-07-12: 2 mL via INTRAVENOUS

## 2012-07-12 MED ORDER — LIDOCAINE-EPINEPHRINE (PF) 1 %-1:200000 IJ SOLN
INTRAMUSCULAR | Status: AC
  Filled 2012-07-12: qty 10

## 2012-07-12 MED ORDER — HEPARIN SODIUM (PORCINE) 1000 UNIT/ML IJ SOLN
INTRAMUSCULAR | Status: AC
  Filled 2012-07-12: qty 1

## 2012-07-12 MED ORDER — LIDOCAINE-EPINEPHRINE (PF) 1 %-1:200000 IJ SOLN
INTRAMUSCULAR | Status: DC | PRN
  Administered 2012-07-12: 28 mL

## 2012-07-12 MED ORDER — MIDAZOLAM HCL 5 MG/5ML IJ SOLN
INTRAMUSCULAR | Status: DC | PRN
  Administered 2012-07-12 (×2): 1 mg via INTRAVENOUS

## 2012-07-12 SURGICAL SUPPLY — 50 items
ADH SKN CLS APL DERMABOND .7 (GAUZE/BANDAGES/DRESSINGS) ×1
APL SKNCLS STERI-STRIP NONHPOA (GAUZE/BANDAGES/DRESSINGS)
BAG DECANTER FOR FLEXI CONT (MISCELLANEOUS) ×3 IMPLANT
BENZOIN TINCTURE PRP APPL 2/3 (GAUZE/BANDAGES/DRESSINGS) IMPLANT
BLADE SURG 11 STRL SS (BLADE) ×3 IMPLANT
CANISTER SUCTION 2500CC (MISCELLANEOUS) ×3 IMPLANT
CLOSURE WOUND 1/2 X4 (GAUZE/BANDAGES/DRESSINGS)
CLOTH BEACON ORANGE TIMEOUT ST (SAFETY) ×3 IMPLANT
COVER SURGICAL LIGHT HANDLE (MISCELLANEOUS) ×6 IMPLANT
DERMABOND ADVANCED (GAUZE/BANDAGES/DRESSINGS) ×2
DERMABOND ADVANCED .7 DNX12 (GAUZE/BANDAGES/DRESSINGS) IMPLANT
DRAPE C-ARM 42X72 X-RAY (DRAPES) ×3 IMPLANT
DRAPE CHEST BREAST 15X10 FENES (DRAPES) ×3 IMPLANT
DRAPE LAPAROTOMY TRNSV 102X78 (DRAPE) ×3 IMPLANT
ELECT CAUTERY BLADE 6.4 (BLADE) ×3 IMPLANT
ELECT REM PT RETURN 9FT ADLT (ELECTROSURGICAL) ×3
ELECTRODE REM PT RTRN 9FT ADLT (ELECTROSURGICAL) ×1 IMPLANT
GLOVE BIO SURGEON STRL SZ 6 (GLOVE) ×2 IMPLANT
GLOVE BIO SURGEON STRL SZ7.5 (GLOVE) ×6 IMPLANT
GLOVE BIOGEL PI IND STRL 6 (GLOVE) IMPLANT
GLOVE BIOGEL PI INDICATOR 6 (GLOVE) ×2
GOWN STRL NON-REIN LRG LVL3 (GOWN DISPOSABLE) ×7 IMPLANT
GUIDEWIRE UNCOATED ST S 7038 (WIRE) IMPLANT
INTRODUCER 13FR (MISCELLANEOUS) IMPLANT
INTRODUCER COOK 11FR (CATHETERS) IMPLANT
KIT BASIN OR (CUSTOM PROCEDURE TRAY) ×3 IMPLANT
KIT PORT POWER 9.6FR MRI PREA (Catheter) ×2 IMPLANT
KIT PORT POWER ISP 8FR (Catheter) IMPLANT
KIT POWER CATH 8FR (Catheter) IMPLANT
KIT ROOM TURNOVER OR (KITS) ×3 IMPLANT
NDL 18GX1X1/2 (RX/OR ONLY) (NEEDLE) ×1 IMPLANT
NDL 25GX 5/8IN NON SAFETY (NEEDLE) ×1 IMPLANT
NEEDLE 18GX1X1/2 (RX/OR ONLY) (NEEDLE) ×3 IMPLANT
NEEDLE 22X1 1/2 (OR ONLY) (NEEDLE) ×3 IMPLANT
NEEDLE 25GX 5/8IN NON SAFETY (NEEDLE) ×3 IMPLANT
NS IRRIG 1000ML POUR BTL (IV SOLUTION) ×3 IMPLANT
PACK GENERAL/GYN (CUSTOM PROCEDURE TRAY) ×3 IMPLANT
PAD ARMBOARD 7.5X6 YLW CONV (MISCELLANEOUS) ×6 IMPLANT
SET SHEATH INTRODUCER 10FR (MISCELLANEOUS) IMPLANT
SPONGE GAUZE 4X4 12PLY (GAUZE/BANDAGES/DRESSINGS) ×3 IMPLANT
STRIP CLOSURE SKIN 1/2X4 (GAUZE/BANDAGES/DRESSINGS) IMPLANT
SUT ETHILON 3 0 FSL (SUTURE) ×3 IMPLANT
SUT VIC AB 3-0 SH 8-18 (SUTURE) ×2 IMPLANT
SUT VIC AB 3-0 X1 27 (SUTURE) ×3 IMPLANT
SYR 20CC LL (SYRINGE) ×3 IMPLANT
SYR CONTROL 10ML LL (SYRINGE) ×3 IMPLANT
SYRINGE 10CC LL (SYRINGE) ×3 IMPLANT
TOWEL OR 17X24 6PK STRL BLUE (TOWEL DISPOSABLE) ×3 IMPLANT
TOWEL OR 17X26 10 PK STRL BLUE (TOWEL DISPOSABLE) ×3 IMPLANT
WATER STERILE IRR 1000ML POUR (IV SOLUTION) ×3 IMPLANT

## 2012-07-12 NOTE — Anesthesia Procedure Notes (Addendum)
Procedure Name: MAC Date/Time: 07/12/2012 10:00 AM Performed by: Jerilee Hoh Pre-anesthesia Checklist: Patient identified, Emergency Drugs available, Suction available, Patient being monitored and Timeout performed Patient Re-evaluated:Patient Re-evaluated prior to inductionOxygen Delivery Method: Simple face mask Intubation Type: Tracheostomy Placement Confirmation: positive ETCO2 and breath sounds checked- equal and bilateral Comments: Face mask over trach with ET CO2 monitoring. Pt states mask is comfortable.

## 2012-07-12 NOTE — Progress Notes (Signed)
Day of Surgery Procedure(s) (LRB): INSERTION PORT-A-CATH (Bilateral) Subjective: Recurrent advanced stage lung cancer- needs porta cath SP L lobectomy 2010 No symptoms of SOB or pain Hx of cardiomyopathy- EF .25 Objective: Vital signs in last 24 hours: Temp:  [97.8 F (36.6 C)] 97.8 F (36.6 C) (06/26 0836) Pulse Rate:  [73] 73 (06/26 0836) Cardiac Rhythm:  [-]  Resp:  [18] 18 (06/26 0836) BP: (116)/(77) 116/77 mmHg (06/26 0836) SpO2:  [96 %] 96 % (06/26 0836)  Hemodynamic parameters for last 24 hours:  nsr  Intake/Output from previous day:   Intake/Output this shift:    EXAM Alert and comfortable Clear lungs HR reg  Lab Results: No results found for this basename: WBC, HGB, HCT, PLT,  in the last 72 hours BMET: No results found for this basename: NA, K, CL, CO2, GLUCOSE, BUN, CREATININE, CALCIUM,  in the last 72 hours  PT/INR: No results found for this basename: LABPROT, INR,  in the last 72 hours ABG    Component Value Date/Time   PHART 7.393 03/18/2008 0412   HCO3 28.6* 03/18/2008 0412   TCO2 30 03/18/2008 0412   ACIDBASEDEF 1.3 03/17/2008 1117   O2SAT 97.0 03/18/2008 0412   CBG (last 3)  No results found for this basename: GLUCAP,  in the last 72 hours  Assessment/Plan: S/P Procedure(s) (LRB): INSERTION PORT-A-CATH (Bilateral) Plan R portacath today on [place]   LOS: 0 days    VAN TRIGT III,Saydi Kobel 07/12/2012

## 2012-07-12 NOTE — Anesthesia Postprocedure Evaluation (Signed)
  Anesthesia Post-op Note  Patient: Jeffrey [name]  Procedure(s) Performed: Procedure(s): INSERTION PORT-A-CATH (Bilateral)  Patient Location: PACU  Anesthesia Type:MAC  Level of Consciousness: awake  Airway and Oxygen Therapy: Patient Spontanous Breathing  Post-op Pain: none  Post-op Assessment: Post-op Vital signs reviewed  Post-op Vital Signs: stable  Complications: No apparent anesthesia complications

## 2012-07-12 NOTE — Preoperative (Signed)
Beta Blockers   Reason not to administer Beta Blockers:Not Applicable 

## 2012-07-12 NOTE — Anesthesia Preprocedure Evaluation (Addendum)
Anesthesia Evaluation  Patient identified by MRN, date of birth, ID band Patient awake    Reviewed: Allergy & Precautions, H&P , NPO status , Patient's Chart, lab work & pertinent test results  Airway      Comment: Trach stoma Dental  (+) Edentulous Upper   Pulmonary shortness of breath, COPD Lung ca history breath sounds clear to auscultation        Cardiovascular hypertension, Rhythm:Regular Rate:Normal     Neuro/Psych    GI/Hepatic PUD,   Endo/Other    Renal/GU      Musculoskeletal   Abdominal   Peds  Hematology   Anesthesia Other Findings   Reproductive/Obstetrics                          Anesthesia Physical Anesthesia Plan  ASA: III  Anesthesia Plan: MAC   Post-op Pain Management:    Induction: Intravenous  Airway Management Planned: Natural Airway  Additional Equipment:   Intra-op Plan:   Post-operative Plan:   Informed Consent:   Plan Discussed with: CRNA and Surgeon  Anesthesia Plan Comments:         Anesthesia Quick Evaluation

## 2012-07-12 NOTE — Brief Op Note (Signed)
07/12/2012  10:59 AM  PATIENT:  Glenis Smoker  79 y.o. male  PRE-OPERATIVE DIAGNOSIS:  lung cancer  POST-OPERATIVE DIAGNOSIS:  lung cancer  PROCEDURE:  Right portacath placement SURGEON:  Surgeon(s) and Role:    * Kerin Perna, MD - Primary  PHYSICIAN ASSISTANT: 0  ASSISTANTS:   ANESTHESIA:   local and IV sedation  EBL:  Total I/O In: 250 [I.V.:250] Out: -   BLOOD ADMINISTERED:none  DRAINS: none   LOCAL MEDICATIONS USED:  XYLOCAINE  and Amount: 7 ml  SPECIMEN:  No Specimen  DISPOSITION OF SPECIMEN:  N/A  COUNTS:  YES  TOURNIQUET0  DICTATION: .Dragon Dictation  PLAN OF CARE: Discharge to home after PACU  PATIENT DISPOSITION:  PACU - hemodynamically stable.   Delay start of Pharmacological VTE agent (>24hrs) due to surgical blood loss or risk of bleeding: yes

## 2012-07-12 NOTE — Transfer of Care (Signed)
Immediate Anesthesia Transfer of Care Note  Patient: Jeffrey [name]  Procedure(s) Performed: Procedure(s): INSERTION PORT-A-CATH (Bilateral)  Patient Location: PACU  Anesthesia Type:MAC  Level of Consciousness: awake, alert , oriented and patient cooperative  Airway & Oxygen Therapy: Patient Spontanous Breathing  Post-op Assessment: Report given to PACU RN, Post -op Vital signs reviewed and stable and Patient moving all extremities  Post vital signs: Reviewed and stable  Complications: No apparent anesthesia complications

## 2012-07-13 ENCOUNTER — Encounter (HOSPITAL_COMMUNITY): Payer: Self-pay | Admitting: Cardiothoracic Surgery

## 2012-07-13 NOTE — Op Note (Signed)
NAME:  ABDIEL, BLACKERBY NO.:  [id number]  MEDICAL RECORD NO.:  [id number]  LOCATION:  [facility]                         FACILITY:  [facility]  PHYSICIAN:  Kerin Perna, M.D.  DATE OF BIRTH:  Nov 03, 1933  DATE OF PROCEDURE:  07/12/2012 DATE OF DISCHARGE:  07/12/2012                              OPERATIVE REPORT   OPERATION:  Placement of right Port-A-Cath.  PREOPERATIVE DIAGNOSIS:  Advanced stage lung cancer.  POSTOPERATIVE DIAGNOSIS:  Advanced stage lung cancer.  SURGEON:  Kerin Perna, M.D.  ANESTHESIA:  Local 1% lidocaine with IV conscious sedation, monitored by Anesthesia.  OPERATIVE PROCEDURE:  The patient was brought to the operating room and placed supine on the operating table where general anesthesia was induced.  The right chest was prepped and draped as a sterile field.  A proper time-out was performed.  Local anesthesia was infiltrated beneath the right clavicle as well as in the third interspace in the midclavicular line.  First, the right subclavian vein was cannulated and a guidewire passed using the Seldinger technique.  Guidewire placement was confirmed by fluoroscopy.  Next, an incision was made in the midclavicular line of the third interspace for the reservoir pocket.  Local anesthesia was used.  Next, the PowerPoint Port-A-Cath was tunneled from the lower incision to the upper incision.  It was shortened to the appropriate length at 28 cm.  Next, through the guidewire, the tearaway dilator and sheath was inserted and through the tearaway sheath, the Port-A-Cath was passed down into the superior vena cava.  Position was confirmed by C-arm fluoroscopy.  The catheter was flushed with dilute heparin saline solution and then flushed with 2 mL of concentrated heparin.  The reservoir was placed into the pocket.  The two incisions were then closed with Vicryl and sterile dressings were applied.  The patient was turned to the recovery room in  stable condition.     Kerin Perna, M.D.     PV/MEDQ  D:  07/12/2012  T:  07/13/2012  Job:  [id number]  cc:   Ladona Horns. Mariel Sleet, MD

## 2012-07-23 ENCOUNTER — Ambulatory Visit (INDEPENDENT_AMBULATORY_CARE_PROVIDER_SITE_OTHER): Payer: Self-pay

## 2012-07-23 DIAGNOSIS — D381 Neoplasm of uncertain behavior of trachea, bronchus and lung: Secondary | ICD-10-CM

## 2012-07-23 DIAGNOSIS — Z4802 Encounter for removal of sutures: Secondary | ICD-10-CM

## 2012-07-23 NOTE — Progress Notes (Signed)
Removed 2 sutures from incision sites, no signs of infection and patient tolerated well.

## 2012-07-24 ENCOUNTER — Encounter (HOSPITAL_COMMUNITY): Payer: Managed Care, Other (non HMO) | Attending: Oncology | Admitting: Oncology

## 2012-07-24 ENCOUNTER — Encounter (HOSPITAL_COMMUNITY): Payer: Self-pay | Admitting: Oncology

## 2012-07-24 VITALS — BP 118/78 | HR 77 | Temp 97.9°F | Wt 138.8 lb

## 2012-07-24 DIAGNOSIS — Z85118 Personal history of other malignant neoplasm of bronchus and lung: Secondary | ICD-10-CM | POA: Insufficient documentation

## 2012-07-24 DIAGNOSIS — C799 Secondary malignant neoplasm of unspecified site: Secondary | ICD-10-CM

## 2012-07-24 DIAGNOSIS — R918 Other nonspecific abnormal finding of lung field: Secondary | ICD-10-CM | POA: Insufficient documentation

## 2012-07-24 DIAGNOSIS — C801 Malignant (primary) neoplasm, unspecified: Secondary | ICD-10-CM | POA: Insufficient documentation

## 2012-07-24 HISTORY — DX: Secondary malignant neoplasm of unspecified site: C79.9

## 2012-07-24 MED ORDER — LORAZEPAM 0.5 MG PO TABS: 0.5000 mg | ORAL_TABLET | Freq: Four times a day (QID) | ORAL | Status: DC | PRN

## 2012-07-24 MED ORDER — ONDANSETRON HCL 8 MG PO TABS: ORAL_TABLET | ORAL | Status: DC

## 2012-07-24 MED ORDER — DEXAMETHASONE 4 MG PO TABS: ORAL_TABLET | ORAL | Status: DC

## 2012-07-24 MED ORDER — LIDOCAINE-PRILOCAINE 2.5-2.5 % EX CREA: TOPICAL_CREAM | CUTANEOUS | Status: DC

## 2012-07-24 NOTE — Patient Instructions (Addendum)
.  [facility] Discharge Instructions  RECOMMENDATIONS MADE BY THE CONSULTANT AND ANY TEST RESULTS WILL BE SENT TO YOUR REFERRING PHYSICIAN.  EXAM FINDINGS BY THE PHYSICIAN TODAY AND SIGNS OR SYMPTOMS TO REPORT TO CLINIC OR PRIMARY PHYSICIAN:  Carbo and taxol every 21 days  You will get neulasta the day after chemo to help boost your white blood cell count.  You will have a steroid that you will need to take twice the night before chemo  INSTRUCTIONS GIVEN AND DISCUSSED: Rolly Salter will call you about chemo teaching   SPECIAL INSTRUCTIONS/FOLLOW-UP: 4 weeks to see Elijah Birk  Thank you for choosing [facility] to provide your oncology and hematology care.  To afford each patient quality time with our providers, please arrive at least 15 minutes before your scheduled appointment time.  With your help, our goal is to use those 15 minutes to complete the necessary work-up to ensure our physicians have the information they need to help with your evaluation and healthcare recommendations.    Effective January 1st, 2014, we ask that you re-schedule your appointment with our physicians should you arrive 10 or more minutes late for your appointment.  We strive to give you quality time with our providers, and arriving late affects you and other patients whose appointments are after yours.    Again, thank you for choosing [facility].  Our hope is that these requests will decrease the amount of time that you wait before being seen by our physicians.       _____________________________________________________________  Should you have questions after your visit to [facility], please contact our office at [phone] between the hours of 8:30 a.m. and 5:00 p.m.  Voicemails left after 4:30 p.m. will not be returned until the following business day.  For prescription refill requests, have your pharmacy contact our office with your prescription refill  request.

## 2012-07-24 NOTE — Progress Notes (Signed)
Jeffrey Asa, MD [address] [place] [address]  Metastatic cancer - Plan: CBC with Differential, Comprehensive metabolic panel  CURRENT THERAPY: To embark on chemotherapy consisting of Carboplatin/Paclitaxel on 07/31/2012.   INTERVAL HISTORY: Jeffrey [name] 79 y.o. male returns for  regular  visit for followup of recurrent metastatic disease consistent with either cancer of the lung or cancer of the larynx, both resected in 2010 now with multiple pulmonary nodules all consistent with metastatic disease.  The patient is S/P port-a-cath insertion by Dr. Donata Clay on 07/12/2012.  The patient wanted to wait on therapy until after the 4th of July.  As a result, he is here today to discuss treatment options. Per. Dr. Thornton Papas note, he would treat the patient with a lung cancer regimen consisting of Paclitaxel and Carboplatin.  This would be palliative only.  We will treat him cycle by cycle and see how well he tolerates chemotherapy.  This treatment plan can be discontinued at any time for intolerance. If tolerated, would administer 3 cycles of chemotherapy and restage him with CT scans.  The patient denies any complaints today. He asks if I will review his most recent chest x-ray with him that was performed by Dr. Donata Clay.  I did review this and of course educated the patient that a chest x-ray is not the best study to evaluate status of disease.  A CT scan or other imaging modality is much better. Review of chest x-ray shows a Port-A-Cath in place and catheter. Lungs show COPD/emphysema changes. No obvious lytic lesions appreciated. I reviewed this information with the patient.  I provided patient education regarding his chemotherapy regimen. He understands that this is an every 21 day regimen with Neulasta support on day 2. We discussed the risks, benefits, alternatives, and side effects of therapy. He will undergo chemotherapy teaching in the near future by her nurse  navigator.  Oncologically, the patient denies any complaints and ROS questioning is negative. He is in good spirits and is quite humorous.    Past Medical History  Diagnosis Date  . Tracheostomy in place   . Cancer 2010    laryngeal  . Lung cancer 2010  . Pneumonia 2014  . Dysrhythmia     takes Coreg and Lisinopril daily  . Shortness of breath     with exertion  . Enlarged prostate     takes flomax  . Urinary urgency   . Gastric ulcer     hx of  . Hx of transfusion of packed red blood cells     no abnormal reaction noted by patient  . Umbilical hernia   . Metastatic cancer 07/24/2012    has TOBACCO ABUSE; EAR PAIN, LEFT; C O P D; NECK PAIN, LEFT; POSTNASAL DRIP SYNDROME; UNSPECIFIED RESPIRATORY ABNORMALITY; Acute respiratory failure; Community acquired pneumonia; Acute renal failure; Dehydration; History of laryngeal cancer; History of lung cancer; Tracheostomy in place; Elevated brain natriuretic peptide (BNP) level; Pulmonary nodules; Bilateral hydronephrosis; Cardiomyopathy; and Metastatic cancer on his problem list.     has No Known Allergies.  Jeffrey [name] does not currently have medications on file.  Past Surgical History  Procedure Laterality Date  . Tracheostomy  2010  . Lung removal, partial  2010    left side  . Layngectomy for laryngeal cancer in2010; left upper lobectomy for lung cancer, 2010.    . Back surgery      thinks it was done in 2012  . Esophagogastroduodenoscopy    .  Colonoscopy    . Portacath placement Bilateral 07/12/2012    Procedure: INSERTION PORT-A-CATH;  Surgeon: Kerin Perna, MD;  Location: [facility] OR;  Service: Thoracic;  Laterality: Bilateral;    Denies any headaches, dizziness, double vision, fevers, chills, night sweats, nausea, vomiting, diarrhea, constipation, chest pain, heart palpitations, shortness of breath, blood in stool, black tarry stool, urinary pain, urinary burning, urinary frequency, hematuria.   PHYSICAL EXAMINATION  ECOG  PERFORMANCE STATUS: 1 - Symptomatic but completely ambulatory  Filed Vitals:   07/24/12 1448  BP: 118/78  Pulse: 77  Temp: 97.9 F (36.6 C)    GENERAL:alert, no distress, well nourished, well developed, comfortable, cooperative, smiling and chronically ill appearing, using speaking device secondary to history of laryngeal cancer. Humorous. SKIN: skin color, texture, turgor are normal, no rashes or significant lesions HEAD: Normocephalic, No masses, lesions, tenderness or abnormalities EYES: normal, PERRLA, EOMI, Conjunctiva are pink and non-injected EARS: External ears normal OROPHARYNX:mucous membranes are moist  NECK: supple, no adenopathy, thyroid normal size, non-tender, without nodularity, no stridor, non-tender, trachea midline LYMPH:  no palpable lymphadenopathy BREAST:not examined LUNGS: positive findings: rhonchi  diffusely, wheezing  diffusely HEART: regular rate & rhythm, no murmurs, no gallops, S1 normal and S2 normal ABDOMEN:abdomen soft and non-tender BACK: Back symmetric, no curvature. EXTREMITIES:less then 2 second capillary refill, no joint deformities, effusion, or inflammation, no skin discoloration  NEURO: alert & oriented x 3 with fluent speech, no focal motor/sensory deficits, gait normal   LABORATORY DATA: CBC    Component Value Date/Time   WBC 5.4 07/06/2012 1037   RBC 4.43 07/06/2012 1037   HGB 13.9 07/06/2012 1037   HCT 40.4 07/06/2012 1037   PLT 176 07/06/2012 1037   MCV 91.2 07/06/2012 1037   MCH 31.4 07/06/2012 1037   MCHC 34.4 07/06/2012 1037   RDW 13.8 07/06/2012 1037   LYMPHSABS 0.6* 06/19/2012 0841   MONOABS 0.4 06/19/2012 0841   EOSABS 0.3 06/19/2012 0841   BASOSABS 0.0 06/19/2012 0841      Chemistry      Component Value Date/Time   NA 138 07/06/2012 1037   K 5.1 07/06/2012 1037   CL 102 07/06/2012 1037   CO2 27 07/06/2012 1037   BUN 16 07/06/2012 1037   CREATININE 1.34 07/06/2012 1037   CREATININE 1.26 02/09/2012 1046      Component Value Date/Time    CALCIUM 9.3 07/06/2012 1037   CALCIUM 8.5 01/26/2012 1312   ALKPHOS 62 07/06/2012 1037   AST 16 07/06/2012 1037   ALT 9 07/06/2012 1037   BILITOT 0.3 07/06/2012 1037        ASSESSMENT:  1. Metastatic cancer, likely lung primary.  S/P port-a-cath on 07/13/2012.  To begin palliative chemotherapy consisting of carboplatin/paclitaxel in the near future.  2. Cardiomyopathy with a decreased ejection fraction in the 15-20% range area   Patient Active Problem List   Diagnosis Date Noted  . Metastatic cancer 07/24/2012  . Cardiomyopathy 01/27/2012  . History of laryngeal cancer 01/26/2012  . History of lung cancer 01/26/2012  . Tracheostomy in place 01/26/2012  . Elevated brain natriuretic peptide (BNP) level 01/26/2012  . Pulmonary nodules 01/26/2012  . Bilateral hydronephrosis 01/26/2012  . Acute respiratory failure 01/24/2012  . Community acquired pneumonia 01/24/2012  . Acute renal failure 01/24/2012  . Dehydration 01/24/2012  . TOBACCO ABUSE 02/28/2008  . EAR PAIN, LEFT 02/28/2008  . C O P D 02/28/2008  . NECK PAIN, LEFT 02/28/2008  . POSTNASAL DRIP SYNDROME 02/28/2008  .  UNSPECIFIED RESPIRATORY ABNORMALITY 02/28/2008    PLAN:  1. I personally reviewed and went over laboratory results with the patient. 2. I personally reviewed and went over radiographic studies with the patient. 3. Pre-chemo labs: CBC diff, CMET 4. We will plan to start chemotherapy on Monday 07/30/2012. 5. Chemo-teaching this week by our nurse navigator 6. Patient education regarding chemotherapy.  7. Risks, benefits, alternatives, and side effects of chemotherapy discussed. 8. Treatment plan built and reviewed.  9. Return in 4 weeks for follow-up.   THERAPY PLAN:  We will embark on chemotherapy next Monday per the patient's request. We will pursue carboplatin and paclitaxel chemotherapy every 21 days with Neulasta support. He'll undergo chemotherapy teaching this week by our nurse navigator.  We'll perform  prechemotherapy laboratory work the day of his chemotherapy teaching. We'll see him back in 3-4 weeks for followup prior to cycle 2 of chemotherapy. We will take it cycle by cycle of according to the patient's tolerability. This is palliative only and can be stopped at any time. The plan is to administer 3 cycles of chemotherapy and restage him with scans at that time.  All questions were answered. The patient knows to call the clinic with any problems, questions or concerns. We can certainly see the patient much sooner if necessary.  Patient and plan discussed with Dr. Gerarda Fraction and he is in agreement with the aforementioned.  Fin Hupp

## 2012-07-25 MED ORDER — ONDANSETRON HCL 8 MG PO TABS: ORAL_TABLET | ORAL | Status: DC

## 2012-07-25 NOTE — Patient Instructions (Addendum)
[facility]   CHEMOTHERAPY INSTRUCTIONS  Taxol - the first time you receive this drug we will titrate it very slowly to ensure that you do not have or are not having an allergic reaction to the chemo. You will also be responsible for taking a steroid called Dexamethasone before and after chemo. This is to reduce your risk of having an allergic reaction to the Taxol. Side Effects: hair loss, lowers your white blood cells (fight infection), muscle aches, nausea/vomiting, irritation to the mouth (mouth sores, pain in your mouth) *neuropathy - numbness/tingling/burning in hands/fingers/feet/toes. We need to know as soon as this begins to happen so that we can monitor it and treat if necessary. The numbness generally begins in the fingertips of tips of toes and then begins to travel up the finger/toe/hand/foot. We never want you getting to where you can't pick up a pen, coin, zip a zipper, button a button, or have trouble walking. You must tell us immediately if you are experiencing peripheral neuropathy!  Carboplatin - this medication can be hard on your kidneys - this is why we need you to drink 64 oz of fluid (preferably water/decaff fluids) 2 days prior to chemo and for up to 4-5 days after chemo. Drink more if you can. This will help to keep your kidneys flushed. This can cause mild hair loss, lower your platelets (which make your blood clot), lower your white blood cells (fight infection), and cause nausea/vomiting.   Neulasta - this medication is not chemo but being given because you have had chemo. It is usually given 20-24 hours after the completion of chemotherapy. This medication works by boosting your bone marrow's supply of white blood cells. White blood cells are what protect our bodies against infection. The medication is given in the form of a subcutaneous injection. It is given in the fatty tissue of your abdomen. It is a short needle. The major side effect of  this medication is bone or muscle pain. The drug of choice to relieve or lessen the pain is Aleve or Ibuprofen. If a physician has ever told you not to take Aleve or Ibuprofen - then don't take it. You should then take Tylenol/acetaminophen. Take either medication as the bottle directs you to.  The level of pain you experience as a result of this injection can range from none, to mild or moderate, or severe. Please let us know if you develop moderate or severe bone pain.    POTENTIAL SIDE EFFECTS OF TREATMENT: Increased Susceptibility to Infection, Vomiting, Constipation, Hair Thinning, Changes in Character of Skin and Nails (brittleness, dryness,etc.), Pigment Changes (darkening of veins, nail beds, palms of hands, soles of feet, etc.), Bone Marrow Suppression, Abdominal Cramping, Nausea, Sun Sensitivity and Mouth Sores   SELF IMAGE NEEDS AND REFERRALS MADE: Obtain hair accessories as soon as possible (caps, etc.)   EDUCATIONAL MATERIALS GIVEN AND REVIEWED: Chemotherapy and You  Specific Instructions Sheets: Carbo, Taxol, Neulasta, Dexamethasone, Zofran, Benadryl, Pepcid   SELF CARE ACTIVITIES WHILE ON CHEMOTHERAPY: Increase your fluid intake 48 hours prior to treatment and drink at least 2 quarts per day after treatment., No alcohol intake., No aspirin or other medications unless approved by your oncologist., Eat foods that are light and easy to digest., Eat foods at cold or room temperature., No fried, fatty, or spicy foods immediately before or after treatment., Have teeth cleaned professionally before starting treatment. Keep dentures and partial plates clean., Use soft toothbrush and do not use  mouthwashes that contain alcohol. Biotene is a good mouthwash that is available at most pharmacies or may be ordered by calling [phone]., Use warm salt water gargles (1 teaspoon salt per 1 quart warm water) before and after meals and at bedtime. Or you may rinse with 2 tablespoons of three  -percent hydrogen peroxide mixed in eight ounces of water., Always use sunscreen with SPF (Sun Protection Factor) of 30 or higher., Use your nausea medication as directed to prevent nausea., Use your stool softener or laxative as directed to prevent constipation. and Use your anti-diarrheal medication as directed to stop diarrhea.  Please wash your hands for at least 30 seconds using warm soapy water. Handwashing is the #1 way to prevent the spread of germs. Stay away from sick people or people who are getting over a cold. If you develop respiratory systems such as green/yellow mucus production or productive cough or persistent cough let us know and we will see if you need an antibiotic. It is a good idea to keep a pair of gloves on when going into grocery stores/[employer] to decrease your risk of coming into contact with germs on the carts, etc. Carry alcohol hand gel with you at all times and use it frequently if out in public. All foods need to be cooked thoroughly. No raw foods. No medium or undercooked meats, eggs. If your food is cooked medium well, it does not need to be hot pink or saturated with bloody liquid at all. Vegetables and fruits need to be washed/rinsed under the faucet with a dish detergent before being consumed. You can eat raw fruits and vegetables unless we tell you otherwise but it would be best if you cooked them or bought frozen. Do not eat off of salad bars or hot bars unless you really trust the cleanliness of the restaurant. If you need dental work, please let Dr. Mariel Sleet know before you go for your appointment so that we can coordinate the best possible time for you in regards to your chemo regimen. You need to also let your dentist know that you are actively taking chemo. We may need to do labs prior to your dental appointment. We also want your bowels moving at least every other day. If this is not happening, we need to know so that we can get you on a bowel regimen to help you go.       MEDICATIONS: You have been given prescriptions for the following medications:  Dexamethasone 4mg  tablet. At 9pm the night before chemo, take 2 tablets. At 3am the morning of chemo, take 2 tablets. Starting the day after chemo, take 2 tablets in the am and 2 tablets in the pm for 2 days. Take with food.   Zofran/Ondansetron 8mg  tablet. Starting the day after chemo, take 1 tablet in the am and 1 tablet in the pm for 2 days. Then may take 1 tablet two times a day IF needed for nausea/vomiting.   Ativan/Lorazepam 0.5mg . May take 1 tablet every 6 hours IF needed for nausea/vomiting. This tablet may make you sleepy/drowsy.  EMLA cream. Apply a quarter size amount to port site 1 hour prior to chemo. Do not rub in. Cover with plastic.   Over-the-Counter Meds:  Colace - this is a stool softener. Take 100mg  capsule 2-6 times a day as needed. If you have to take more than 6 capsules of Colace a day call the Cancer Center.  Senna - this is a mild laxative used to treat  mild constipation. May take 2 tabs by mouth daily or up to twice a day as needed for mild constipation.  Milk of Magnesia - this is a laxative used to treat moderate to severe constipation. May take 2-4 tablespoons every 8 hours as needed. May increase to 8 tablespoons x 1 dose and if no bowel movement call the Cancer Center.  Imodium - this is for diarrhea. Take 2 tabs after 1st loose stool and then 1 tab after each loose stool until you go a total of 12 hours without a loose stool. Call Cancer Center if loose stools continue.   SYMPTOMS TO REPORT AS SOON AS POSSIBLE AFTER TREATMENT:  FEVER GREATER THAN 100.5 F  CHILLS WITH OR WITHOUT FEVER  NAUSEA AND VOMITING THAT IS NOT CONTROLLED WITH YOUR NAUSEA MEDICATION  UNUSUAL SHORTNESS OF BREATH  UNUSUAL BRUISING OR BLEEDING  TENDERNESS IN MOUTH AND THROAT WITH OR WITHOUT PRESENCE OF ULCERS  URINARY PROBLEMS  BOWEL PROBLEMS  UNUSUAL RASH    Wear comfortable clothing  and clothing appropriate for easy access to any Portacath or PICC line. Let us know if there is anything that we can do to make your therapy better!      I have been informed and understand all of the instructions given to me and have received a copy. I have been instructed to call the clinic [phone] or my family physician as soon as possible for continued medical care, if indicated. I do not have any more questions at this time but understand that I may call the Cancer Center or the Patient Navigator at [phone] during office hours should I have questions or need assistance in obtaining follow-up care.      _________________________________________      _______________     __________ Signature of Patient or Authorized Representative        Date                            Time      _________________________________________ Nurse's Signature

## 2012-07-27 ENCOUNTER — Encounter (HOSPITAL_BASED_OUTPATIENT_CLINIC_OR_DEPARTMENT_OTHER): Payer: Managed Care, Other (non HMO)

## 2012-07-27 DIAGNOSIS — Z85118 Personal history of other malignant neoplasm of bronchus and lung: Secondary | ICD-10-CM

## 2012-07-27 DIAGNOSIS — C799 Secondary malignant neoplasm of unspecified site: Secondary | ICD-10-CM

## 2012-07-27 DIAGNOSIS — R918 Other nonspecific abnormal finding of lung field: Secondary | ICD-10-CM

## 2012-07-27 LAB — COMPREHENSIVE METABOLIC PANEL
Albumin: 3.7 g/dL (ref 3.5–5.2)
BUN: 20 mg/dL (ref 6–23)
Calcium: 9.4 mg/dL (ref 8.4–10.5)
Chloride: 100 mEq/L (ref 96–112)
Creatinine, Ser: 1.37 mg/dL — ABNORMAL HIGH (ref 0.50–1.35)
GFR calc non Af Amer: 49 mL/min — ABNORMAL LOW (ref >90)
Total Bilirubin: 0.3 mg/dL (ref 0.3–1.2)

## 2012-07-27 LAB — CBC WITH DIFFERENTIAL/PLATELET
Basophils Relative: 2 % — ABNORMAL HIGH (ref 0–1)
Eosinophils Relative: 5 % (ref 0–5)
HCT: 39.9 % (ref 39.0–52.0)
Hemoglobin: 13.6 g/dL (ref 13.0–17.0)
MCH: 31.4 pg (ref 26.0–34.0)
MCHC: 34.1 g/dL (ref 30.0–36.0)
MCV: 92.1 fL (ref 78.0–100.0)
Monocytes Absolute: 0.4 10*3/uL (ref 0.1–1.0)
Monocytes Relative: 9 % (ref 3–12)
Neutro Abs: 2.9 10*3/uL (ref 1.7–7.7)

## 2012-07-27 NOTE — Progress Notes (Signed)
Chemo teaching done and consent signed for Carboplatin/Taxol. Patient has already picked up meds. Calendar given.

## 2012-07-31 ENCOUNTER — Encounter (HOSPITAL_BASED_OUTPATIENT_CLINIC_OR_DEPARTMENT_OTHER): Payer: Managed Care, Other (non HMO)

## 2012-07-31 VITALS — BP 120/82 | HR 70 | Temp 97.4°F | Resp 18 | Wt 137.0 lb

## 2012-07-31 DIAGNOSIS — Z85118 Personal history of other malignant neoplasm of bronchus and lung: Secondary | ICD-10-CM

## 2012-07-31 DIAGNOSIS — R918 Other nonspecific abnormal finding of lung field: Secondary | ICD-10-CM

## 2012-07-31 DIAGNOSIS — C329 Malignant neoplasm of larynx, unspecified: Secondary | ICD-10-CM

## 2012-07-31 DIAGNOSIS — Z5111 Encounter for antineoplastic chemotherapy: Secondary | ICD-10-CM

## 2012-07-31 DIAGNOSIS — C3410 Malignant neoplasm of upper lobe, unspecified bronchus or lung: Secondary | ICD-10-CM

## 2012-07-31 MED ORDER — PACLITAXEL CHEMO INJECTION 300 MG/50ML
175.0000 mg/m2 | Freq: Once | INTRAVENOUS | Status: AC
  Administered 2012-07-31: 300 mg via INTRAVENOUS
  Filled 2012-07-31: qty 50

## 2012-07-31 MED ORDER — HEPARIN SOD (PORK) LOCK FLUSH 100 UNIT/ML IV SOLN
500.0000 [IU] | Freq: Once | INTRAVENOUS | Status: AC | PRN
  Administered 2012-07-31: 500 [IU]
  Filled 2012-07-31: qty 5

## 2012-07-31 MED ORDER — SODIUM CHLORIDE 0.9 % IV SOLN
Freq: Once | INTRAVENOUS | Status: AC
  Administered 2012-07-31: 09:00:00 via INTRAVENOUS

## 2012-07-31 MED ORDER — FAMOTIDINE IN NACL 20-0.9 MG/50ML-% IV SOLN
20.0000 mg | Freq: Once | INTRAVENOUS | Status: AC
  Administered 2012-07-31: 20 mg via INTRAVENOUS

## 2012-07-31 MED ORDER — SODIUM CHLORIDE 0.9 % IJ SOLN
10.0000 mL | INTRAMUSCULAR | Status: DC | PRN
  Filled 2012-07-31: qty 10

## 2012-07-31 MED ORDER — SODIUM CHLORIDE 0.9 % IV SOLN: 16.0000 mg | Freq: Once | INTRAVENOUS | Status: DC

## 2012-07-31 MED ORDER — SODIUM CHLORIDE 0.9 % IV SOLN
332.5000 mg | Freq: Once | INTRAVENOUS | Status: AC
  Administered 2012-07-31: 330 mg via INTRAVENOUS
  Filled 2012-07-31: qty 33

## 2012-07-31 MED ORDER — DIPHENHYDRAMINE HCL 50 MG/ML IJ SOLN
50.0000 mg | Freq: Once | INTRAMUSCULAR | Status: AC
  Administered 2012-07-31: 50 mg via INTRAVENOUS

## 2012-07-31 MED ORDER — SODIUM CHLORIDE 0.9 % IV SOLN
Freq: Once | INTRAVENOUS | Status: AC
  Administered 2012-07-31: 16 mg via INTRAVENOUS
  Filled 2012-07-31: qty 8

## 2012-07-31 MED ORDER — DEXAMETHASONE SODIUM PHOSPHATE 10 MG/ML IJ SOLN: 20.0000 mg | Freq: Once | INTRAMUSCULAR | Status: DC

## 2012-07-31 MED ORDER — FAMOTIDINE IN NACL 20-0.9 MG/50ML-% IV SOLN
INTRAVENOUS | Status: AC
  Filled 2012-07-31: qty 50

## 2012-07-31 MED ORDER — DIPHENHYDRAMINE HCL 50 MG/ML IJ SOLN
INTRAMUSCULAR | Status: AC
  Filled 2012-07-31: qty 1

## 2012-07-31 NOTE — Progress Notes (Signed)
Taxol started at 1011 at 21 cc's hr. See MAR for rate increases. Tolerated chemo well

## 2012-08-01 ENCOUNTER — Encounter (HOSPITAL_BASED_OUTPATIENT_CLINIC_OR_DEPARTMENT_OTHER): Payer: Managed Care, Other (non HMO)

## 2012-08-01 VITALS — BP 131/81 | HR 82 | Temp 97.7°F | Resp 18

## 2012-08-01 DIAGNOSIS — R918 Other nonspecific abnormal finding of lung field: Secondary | ICD-10-CM

## 2012-08-01 DIAGNOSIS — Z85118 Personal history of other malignant neoplasm of bronchus and lung: Secondary | ICD-10-CM

## 2012-08-01 DIAGNOSIS — C329 Malignant neoplasm of larynx, unspecified: Secondary | ICD-10-CM

## 2012-08-01 DIAGNOSIS — Z5189 Encounter for other specified aftercare: Secondary | ICD-10-CM

## 2012-08-01 DIAGNOSIS — C3410 Malignant neoplasm of upper lobe, unspecified bronchus or lung: Secondary | ICD-10-CM

## 2012-08-01 MED ORDER — PEGFILGRASTIM INJECTION 6 MG/0.6ML
SUBCUTANEOUS | Status: AC
  Filled 2012-08-01: qty 0.6

## 2012-08-01 MED ORDER — PEGFILGRASTIM INJECTION 6 MG/0.6ML
6.0000 mg | Freq: Once | SUBCUTANEOUS | Status: AC
  Administered 2012-08-01: 6 mg via SUBCUTANEOUS

## 2012-08-01 NOTE — Progress Notes (Signed)
Jeffrey [name] presents today for injection per the provider's orders.  Neupogen administered administration without incident; see MAR for injection details.  Patient tolerated procedure well and without incident.  Jeffrey [name] also reports tolerating chemo well yesterday - did report 1 episode of n/v last pm that resolved on its own. No questions or complaints noted at this time.

## 2012-08-20 ENCOUNTER — Other Ambulatory Visit: Payer: Self-pay | Admitting: Family Medicine

## 2012-08-20 ENCOUNTER — Encounter (HOSPITAL_COMMUNITY): Payer: Managed Care, Other (non HMO) | Attending: Oncology | Admitting: Oncology

## 2012-08-20 ENCOUNTER — Encounter (HOSPITAL_BASED_OUTPATIENT_CLINIC_OR_DEPARTMENT_OTHER): Payer: Managed Care, Other (non HMO)

## 2012-08-20 VITALS — BP 129/74 | HR 80 | Temp 97.7°F | Resp 16 | Wt 138.6 lb

## 2012-08-20 DIAGNOSIS — Z5111 Encounter for antineoplastic chemotherapy: Secondary | ICD-10-CM

## 2012-08-20 DIAGNOSIS — C799 Secondary malignant neoplasm of unspecified site: Secondary | ICD-10-CM

## 2012-08-20 DIAGNOSIS — R918 Other nonspecific abnormal finding of lung field: Secondary | ICD-10-CM | POA: Insufficient documentation

## 2012-08-20 DIAGNOSIS — C801 Malignant (primary) neoplasm, unspecified: Secondary | ICD-10-CM

## 2012-08-20 DIAGNOSIS — Z85118 Personal history of other malignant neoplasm of bronchus and lung: Secondary | ICD-10-CM | POA: Insufficient documentation

## 2012-08-20 DIAGNOSIS — I428 Other cardiomyopathies: Secondary | ICD-10-CM

## 2012-08-20 LAB — COMPREHENSIVE METABOLIC PANEL
ALT: 11 U/L (ref 0–53)
AST: 13 U/L (ref 0–37)
Albumin: 3.1 g/dL — ABNORMAL LOW (ref 3.5–5.2)
CO2: 25 mEq/L (ref 19–32)
Chloride: 101 mEq/L (ref 96–112)
Creatinine, Ser: 1.35 mg/dL (ref 0.50–1.35)
GFR calc non Af Amer: 49 mL/min — ABNORMAL LOW (ref >90)
Potassium: 5 mEq/L (ref 3.5–5.1)
Sodium: 134 mEq/L — ABNORMAL LOW (ref 135–145)
Total Bilirubin: 0.2 mg/dL — ABNORMAL LOW (ref 0.3–1.2)

## 2012-08-20 LAB — CBC WITH DIFFERENTIAL/PLATELET
Basophils Absolute: 0 10*3/uL (ref 0.0–0.1)
Basophils Relative: 0 % (ref 0–1)
HCT: 35.1 % — ABNORMAL LOW (ref 39.0–52.0)
Lymphocytes Relative: 5 % — ABNORMAL LOW (ref 12–46)
MCHC: 34.2 g/dL (ref 30.0–36.0)
Neutro Abs: 4.5 10*3/uL (ref 1.7–7.7)
Neutrophils Relative %: 94 % — ABNORMAL HIGH (ref 43–77)
Platelets: 264 10*3/uL (ref 150–400)
RDW: 13.9 % (ref 11.5–15.5)
WBC: 4.8 10*3/uL (ref 4.0–10.5)

## 2012-08-20 MED ORDER — SODIUM CHLORIDE 0.9 % IV SOLN
Freq: Once | INTRAVENOUS | Status: AC
  Administered 2012-08-20: 16 mg via INTRAVENOUS
  Filled 2012-08-20: qty 8

## 2012-08-20 MED ORDER — HEPARIN SOD (PORK) LOCK FLUSH 100 UNIT/ML IV SOLN
INTRAVENOUS | Status: AC
  Filled 2012-08-20: qty 5

## 2012-08-20 MED ORDER — FAMOTIDINE IN NACL 20-0.9 MG/50ML-% IV SOLN
INTRAVENOUS | Status: AC
  Filled 2012-08-20: qty 50

## 2012-08-20 MED ORDER — HEPARIN SOD (PORK) LOCK FLUSH 100 UNIT/ML IV SOLN
500.0000 [IU] | Freq: Once | INTRAVENOUS | Status: AC | PRN
  Administered 2012-08-20: 500 [IU]
  Filled 2012-08-20: qty 5

## 2012-08-20 MED ORDER — ONDANSETRON HCL 40 MG/20ML IJ SOLN: 16.0000 mg | Freq: Once | INTRAMUSCULAR | Status: DC

## 2012-08-20 MED ORDER — SODIUM CHLORIDE 0.9 % IV SOLN
332.5000 mg | Freq: Once | INTRAVENOUS | Status: AC
  Administered 2012-08-20: 330 mg via INTRAVENOUS
  Filled 2012-08-20: qty 33

## 2012-08-20 MED ORDER — DEXAMETHASONE SODIUM PHOSPHATE 10 MG/ML IJ SOLN
20.0000 mg | Freq: Once | INTRAMUSCULAR | Status: DC
Start: 2012-08-20 — End: 2012-08-20

## 2012-08-20 MED ORDER — SODIUM CHLORIDE 0.9 % IV SOLN
Freq: Once | INTRAVENOUS | Status: AC
  Administered 2012-08-20: 10:00:00 via INTRAVENOUS

## 2012-08-20 MED ORDER — SODIUM CHLORIDE 0.9 % IJ SOLN
10.0000 mL | INTRAMUSCULAR | Status: DC | PRN
  Administered 2012-08-20: 10 mL
  Filled 2012-08-20: qty 10

## 2012-08-20 MED ORDER — DIPHENHYDRAMINE HCL 50 MG/ML IJ SOLN
50.0000 mg | Freq: Once | INTRAMUSCULAR | Status: AC
  Administered 2012-08-20: 50 mg via INTRAVENOUS

## 2012-08-20 MED ORDER — DIPHENHYDRAMINE HCL 50 MG/ML IJ SOLN
INTRAMUSCULAR | Status: AC
  Filled 2012-08-20: qty 1

## 2012-08-20 MED ORDER — FAMOTIDINE IN NACL 20-0.9 MG/50ML-% IV SOLN
20.0000 mg | Freq: Once | INTRAVENOUS | Status: AC
  Administered 2012-08-20: 20 mg via INTRAVENOUS

## 2012-08-20 MED ORDER — PACLITAXEL CHEMO INJECTION 300 MG/50ML
175.0000 mg/m2 | Freq: Once | INTRAVENOUS | Status: AC
  Administered 2012-08-20: 300 mg via INTRAVENOUS
  Filled 2012-08-20: qty 50

## 2012-08-20 NOTE — Patient Instructions (Addendum)
.  [facility] Discharge Instructions  RECOMMENDATIONS MADE BY THE CONSULTANT AND ANY TEST RESULTS WILL BE SENT TO YOUR REFERRING PHYSICIAN.  EXAM FINDINGS BY THE PHYSICIAN TODAY AND SIGNS OR SYMPTOMS TO REPORT TO CLINIC OR PRIMARY PHYSICIAN: Exam per Samuella Bruin PA today. Doing well with chemo. We will keep going. CT scan of chest after cycle 3 chemo  INSTRUCTIONS GIVEN AND DISCUSSED: appt with Tom in 3 weeks and MD in 6 weeks after CT scans   Thank you for choosing [facility] to provide your oncology and hematology care.  To afford each patient quality time with our providers, please arrive at least 15 minutes before your scheduled appointment time.  With your help, our goal is to use those 15 minutes to complete the necessary work-up to ensure our physicians have the information they need to help with your evaluation and healthcare recommendations.    Effective January 1st, 2014, we ask that you re-schedule your appointment with our physicians should you arrive 10 or more minutes late for your appointment.  We strive to give you quality time with our providers, and arriving late affects you and other patients whose appointments are after yours.    Again, thank you for choosing [facility].  Our hope is that these requests will decrease the amount of time that you wait before being seen by our physicians.       _____________________________________________________________  Should you have questions after your visit to [facility], please contact our office at [phone] between the hours of 8:30 a.m. and 5:00 p.m.  Voicemails left after 4:30 p.m. will not be returned until the following business day.  For prescription refill requests, have your pharmacy contact our office with your prescription refill request.

## 2012-08-20 NOTE — Progress Notes (Signed)
Tolerated chemo well today. 

## 2012-08-20 NOTE — Progress Notes (Signed)
Jeffrey Asa, MD [address] [place] [address]  Metastatic cancer  CURRENT THERAPY:S/P cycle 1 of Carboplatin/Paclitaxel beginning on 07/31/2012.    INTERVAL HISTORY: Jeffrey [name] 79 y.o. male returns for  regular  visit for followup of recurrent metastatic disease consistent with either cancer of the lung or cancer of the larynx, both resected in 2010 now with multiple pulmonary nodules all consistent with metastatic disease.  He tolerated his first cycle well.  He admits to 1 episode of nausea that was well controlled with his home antiemetics and only lasted about 4 hours in total.  He also admits to some pot-therapy constipation and we spent some time discussing a bowel regimen.  He also admits to B/L LE pain following the Neulasta injection.  It lasted 2-3 days in total and OTC medications, namely NSAIDs, worked effectively for managing the discomfort.   Otherwise, he tolerated therapy well.   He reports, "other than the leg pain, I didn't even know I received chemo."   Oncologically, he denies any complaints and ROS questioning is negative.   I personally reviewed and went over laboratory results with the patient.  His labs meet treatment parameters with a platelet count of 264,000, WBC 4.8, and Hgb 12.0 g/dL.    Past Medical History  Diagnosis Date  . Tracheostomy in place   . Cancer 2010    laryngeal  . Lung cancer 2010  . Pneumonia 2014  . Dysrhythmia     takes Coreg and Lisinopril daily  . Shortness of breath     with exertion  . Enlarged prostate     takes flomax  . Urinary urgency   . Gastric ulcer     hx of  . Hx of transfusion of packed red blood cells     no abnormal reaction noted by patient  . Umbilical hernia   . Metastatic cancer 07/24/2012    has TOBACCO ABUSE; EAR PAIN, LEFT; C O P D; NECK PAIN, LEFT; POSTNASAL DRIP SYNDROME; UNSPECIFIED RESPIRATORY ABNORMALITY; Acute respiratory failure; Community acquired pneumonia; Acute renal  failure; Dehydration; History of laryngeal cancer; History of lung cancer; Tracheostomy in place; Elevated brain natriuretic peptide (BNP) level; Pulmonary nodules; Bilateral hydronephrosis; Cardiomyopathy; and Metastatic cancer on his problem list.     has No Known Allergies.  Jeffrey [name] does not currently have medications on file.  Past Surgical History  Procedure Laterality Date  . Tracheostomy  2010  . Lung removal, partial  2010    left side  . Layngectomy for laryngeal cancer in2010; left upper lobectomy for lung cancer, 2010.    . Back surgery      thinks it was done in 2012  . Esophagogastroduodenoscopy    . Colonoscopy    . Portacath placement Bilateral 07/12/2012    Procedure: INSERTION PORT-A-CATH;  Surgeon: Kerin Perna, MD;  Location: [facility] OR;  Service: Thoracic;  Laterality: Bilateral;    Denies any headaches, dizziness, double vision, fevers, chills, night sweats, diarrhea, chest pain, heart palpitations, shortness of breath, blood in stool, black tarry stool, urinary pain, urinary burning, urinary frequency, hematuria.   PHYSICAL EXAMINATION  ECOG PERFORMANCE STATUS: 0 - Asymptomatic  There were no vitals filed for this visit.  GENERAL:alert, no distress, well nourished, well developed, comfortable, cooperative and smiling, tracheostomy SKIN: skin color, texture, turgor are normal, no rashes or significant lesions HEAD: Normocephalic, No masses, lesions, tenderness or abnormalities EYES: normal, PERRLA, EOMI, Conjunctiva are pink and non-injected  EARS: External ears normal OROPHARYNX:mucous membranes are moist  NECK: supple, no adenopathy, thyroid normal size, non-tender, without nodularity, no stridor, non-tender, trachea midline LYMPH:  no palpable lymphadenopathy BREAST:not examined LUNGS: clear to auscultation and percussion, decreased breath sounds HEART: regular rate & rhythm, no murmurs, no gallops, S1 normal and S2 normal ABDOMEN:abdomen soft,  non-tender and normal bowel sounds BACK: Back symmetric, no curvature. EXTREMITIES:less then 2 second capillary refill, no joint deformities, effusion, or inflammation, no edema, no skin discoloration, no clubbing, no cyanosis  NEURO: alert & oriented x 3 with fluent speech, no focal motor/sensory deficits, gait normal   LABORATORY DATA: CBC    Component Value Date/Time   WBC 4.8 08/20/2012 0848   RBC 3.80* 08/20/2012 0848   HGB 12.0* 08/20/2012 0848   HCT 35.1* 08/20/2012 0848   PLT 264 08/20/2012 0848   MCV 92.4 08/20/2012 0848   MCH 31.6 08/20/2012 0848   MCHC 34.2 08/20/2012 0848   RDW 13.9 08/20/2012 0848   LYMPHSABS 0.2* 08/20/2012 0848   MONOABS 0.1 08/20/2012 0848   EOSABS 0.0 08/20/2012 0848   BASOSABS 0.0 08/20/2012 0848      Chemistry      Component Value Date/Time   NA 134* 08/20/2012 0848   K 5.0 08/20/2012 0848   CL 101 08/20/2012 0848   CO2 25 08/20/2012 0848   BUN 29* 08/20/2012 0848   CREATININE 1.35 08/20/2012 0848   CREATININE 1.26 02/09/2012 1046      Component Value Date/Time   CALCIUM 8.9 08/20/2012 0848   CALCIUM 8.5 01/26/2012 1312   ALKPHOS 70 08/20/2012 0848   AST 13 08/20/2012 0848   ALT 11 08/20/2012 0848   BILITOT 0.2* 08/20/2012 0848        ASSESSMENT:  1. Metastatic cancer, likely lung primary. S/P port-a-cath on 07/13/2012. To begin palliative chemotherapy consisting of carboplatin/paclitaxel in the near future.  2. Cardiomyopathy with a decreased ejection fraction in the 15-20% range area   Patient Active Problem List   Diagnosis Date Noted  . Metastatic cancer 07/24/2012  . Cardiomyopathy 01/27/2012  . History of laryngeal cancer 01/26/2012  . History of lung cancer 01/26/2012  . Tracheostomy in place 01/26/2012  . Elevated brain natriuretic peptide (BNP) level 01/26/2012  . Pulmonary nodules 01/26/2012  . Bilateral hydronephrosis 01/26/2012  . Acute respiratory failure 01/24/2012  . Community acquired pneumonia 01/24/2012  . Acute renal failure 01/24/2012  . Dehydration  01/24/2012  . TOBACCO ABUSE 02/28/2008  . EAR PAIN, LEFT 02/28/2008  . C O P D 02/28/2008  . NECK PAIN, LEFT 02/28/2008  . POSTNASAL DRIP SYNDROME 02/28/2008  . UNSPECIFIED RESPIRATORY ABNORMALITY 02/28/2008     PLAN:  1. I personally reviewed and went over laboratory results with the patient.  2. I personally reviewed and went over radiographic studies with the patient.  3. Pre-chemo labs: CBC diff, CMET   4. Patient education regarding bowel regimen 5. Patient education regarding anti-emetic regimen 6. Will restaging him with CT chest with contrast following cycle 3 of chemotherapy.  7. Return in 3 weeks for follow-up.    THERAPY PLAN:  He is tolerating chemotherapy well thus far.  He will continue with restaging CT scan after cycle 3 of chemotherapy.   All questions were answered. The patient knows to call the clinic with any problems, questions or concerns. We can certainly see the patient much sooner if necessary.  Patient and plan discussed with Dr. Janese Banks and he is in agreement with the aforementioned.  KEFALAS,THOMAS

## 2012-08-21 ENCOUNTER — Encounter (HOSPITAL_BASED_OUTPATIENT_CLINIC_OR_DEPARTMENT_OTHER): Payer: Managed Care, Other (non HMO)

## 2012-08-21 ENCOUNTER — Ambulatory Visit (HOSPITAL_COMMUNITY): Payer: Managed Care, Other (non HMO)

## 2012-08-21 VITALS — BP 124/77 | HR 91

## 2012-08-21 DIAGNOSIS — Z5189 Encounter for other specified aftercare: Secondary | ICD-10-CM

## 2012-08-21 DIAGNOSIS — C801 Malignant (primary) neoplasm, unspecified: Secondary | ICD-10-CM

## 2012-08-21 DIAGNOSIS — Z85118 Personal history of other malignant neoplasm of bronchus and lung: Secondary | ICD-10-CM

## 2012-08-21 DIAGNOSIS — R918 Other nonspecific abnormal finding of lung field: Secondary | ICD-10-CM

## 2012-08-21 DIAGNOSIS — C799 Secondary malignant neoplasm of unspecified site: Secondary | ICD-10-CM

## 2012-08-21 MED ORDER — PEGFILGRASTIM INJECTION 6 MG/0.6ML
SUBCUTANEOUS | Status: AC
  Filled 2012-08-21: qty 0.6

## 2012-08-21 MED ORDER — PEGFILGRASTIM INJECTION 6 MG/0.6ML
6.0000 mg | Freq: Once | SUBCUTANEOUS | Status: AC
  Administered 2012-08-21: 6 mg via SUBCUTANEOUS

## 2012-08-21 NOTE — Progress Notes (Signed)
Received neulasta 6 mg to lower abd tissue.  Tolerated well.

## 2012-09-03 ENCOUNTER — Other Ambulatory Visit: Payer: Self-pay | Admitting: Family Medicine

## 2012-09-07 ENCOUNTER — Ambulatory Visit (HOSPITAL_COMMUNITY): Payer: Managed Care, Other (non HMO) | Admitting: Oncology

## 2012-09-10 ENCOUNTER — Encounter (HOSPITAL_BASED_OUTPATIENT_CLINIC_OR_DEPARTMENT_OTHER): Payer: Managed Care, Other (non HMO) | Admitting: Oncology

## 2012-09-10 ENCOUNTER — Encounter (HOSPITAL_BASED_OUTPATIENT_CLINIC_OR_DEPARTMENT_OTHER): Payer: Managed Care, Other (non HMO)

## 2012-09-10 VITALS — BP 131/82 | HR 86 | Temp 97.4°F | Resp 16 | Wt 139.4 lb

## 2012-09-10 DIAGNOSIS — R918 Other nonspecific abnormal finding of lung field: Secondary | ICD-10-CM

## 2012-09-10 DIAGNOSIS — C799 Secondary malignant neoplasm of unspecified site: Secondary | ICD-10-CM

## 2012-09-10 DIAGNOSIS — C801 Malignant (primary) neoplasm, unspecified: Secondary | ICD-10-CM

## 2012-09-10 DIAGNOSIS — M899 Disorder of bone, unspecified: Secondary | ICD-10-CM

## 2012-09-10 DIAGNOSIS — Z85118 Personal history of other malignant neoplasm of bronchus and lung: Secondary | ICD-10-CM

## 2012-09-10 DIAGNOSIS — Z5111 Encounter for antineoplastic chemotherapy: Secondary | ICD-10-CM

## 2012-09-10 LAB — COMPREHENSIVE METABOLIC PANEL
Albumin: 3.4 g/dL — ABNORMAL LOW (ref 3.5–5.2)
BUN: 23 mg/dL (ref 6–23)
Calcium: 9 mg/dL (ref 8.4–10.5)
Chloride: 100 mEq/L (ref 96–112)
Creatinine, Ser: 1.46 mg/dL — ABNORMAL HIGH (ref 0.50–1.35)
GFR calc non Af Amer: 45 mL/min — ABNORMAL LOW (ref >90)
Total Bilirubin: 0.3 mg/dL (ref 0.3–1.2)

## 2012-09-10 LAB — CBC WITH DIFFERENTIAL/PLATELET
Basophils Absolute: 0 10*3/uL (ref 0.0–0.1)
Basophils Relative: 0 % (ref 0–1)
Eosinophils Absolute: 0 10*3/uL (ref 0.0–0.7)
Eosinophils Relative: 0 % (ref 0–5)
HCT: 35.1 % — ABNORMAL LOW (ref 39.0–52.0)
Hemoglobin: 11.9 g/dL — ABNORMAL LOW (ref 13.0–17.0)
MCH: 31.5 pg (ref 26.0–34.0)
MCHC: 33.9 g/dL (ref 30.0–36.0)
MCV: 92.9 fL (ref 78.0–100.0)
Monocytes Absolute: 0 10*3/uL — ABNORMAL LOW (ref 0.1–1.0)
Monocytes Relative: 0 % — ABNORMAL LOW (ref 3–12)
Neutro Abs: 4 10*3/uL (ref 1.7–7.7)
RDW: 15.1 % (ref 11.5–15.5)

## 2012-09-10 MED ORDER — SODIUM CHLORIDE 0.9 % IV SOLN
332.5000 mg | Freq: Once | INTRAVENOUS | Status: AC
  Administered 2012-09-10: 330 mg via INTRAVENOUS
  Filled 2012-09-10: qty 33

## 2012-09-10 MED ORDER — PACLITAXEL CHEMO INJECTION 300 MG/50ML
175.0000 mg/m2 | Freq: Once | INTRAVENOUS | Status: AC
  Administered 2012-09-10: 300 mg via INTRAVENOUS
  Filled 2012-09-10: qty 50

## 2012-09-10 MED ORDER — FAMOTIDINE IN NACL 20-0.9 MG/50ML-% IV SOLN
INTRAVENOUS | Status: AC
  Filled 2012-09-10: qty 50

## 2012-09-10 MED ORDER — HEPARIN SOD (PORK) LOCK FLUSH 100 UNIT/ML IV SOLN
500.0000 [IU] | Freq: Once | INTRAVENOUS | Status: AC | PRN
  Administered 2012-09-10: 500 [IU]
  Filled 2012-09-10: qty 5

## 2012-09-10 MED ORDER — DIPHENHYDRAMINE HCL 50 MG/ML IJ SOLN
50.0000 mg | Freq: Once | INTRAMUSCULAR | Status: AC
  Administered 2012-09-10: 50 mg via INTRAVENOUS

## 2012-09-10 MED ORDER — DEXAMETHASONE SODIUM PHOSPHATE 10 MG/ML IJ SOLN: 20.0000 mg | Freq: Once | INTRAMUSCULAR | Status: DC

## 2012-09-10 MED ORDER — SODIUM CHLORIDE 0.9 % IV SOLN: 16.0000 mg | Freq: Once | INTRAVENOUS | Status: DC

## 2012-09-10 MED ORDER — DIPHENHYDRAMINE HCL 50 MG/ML IJ SOLN
INTRAMUSCULAR | Status: AC
  Filled 2012-09-10: qty 1

## 2012-09-10 MED ORDER — HEPARIN SOD (PORK) LOCK FLUSH 100 UNIT/ML IV SOLN
INTRAVENOUS | Status: AC
  Filled 2012-09-10: qty 5

## 2012-09-10 MED ORDER — SODIUM CHLORIDE 0.9 % IV SOLN
Freq: Once | INTRAVENOUS | Status: AC
  Administered 2012-09-10: 16 mg via INTRAVENOUS
  Filled 2012-09-10: qty 8

## 2012-09-10 MED ORDER — SODIUM CHLORIDE 0.9 % IV SOLN
Freq: Once | INTRAVENOUS | Status: AC
  Administered 2012-09-10: 10:00:00 via INTRAVENOUS

## 2012-09-10 MED ORDER — FAMOTIDINE IN NACL 20-0.9 MG/50ML-% IV SOLN
20.0000 mg | Freq: Once | INTRAVENOUS | Status: AC
  Administered 2012-09-10: 20 mg via INTRAVENOUS

## 2012-09-10 NOTE — Progress Notes (Signed)
Jeffrey Asa, MD [address] [place] [address]  Metastatic cancer  CURRENT THERAPY: S/P cycle 2 of Carboplatin/Paclitaxel beginning on 07/31/2012.    INTERVAL HISTORY: Jeffrey [name] 79 y.o. male returns for  regular  visit for followup of recurrent metastatic disease consistent with either cancer of the lung or cancer of the larynx, both resected in 2010 now with multiple pulmonary nodules all consistent with metastatic disease.   Jeffrey [name] is here today for cycle 3 of chemotherapy.  He tolerated cytotoxic chemotherapy well without any nausea or vomiting, but he admits to severe bone pain about 2-3 days following chemotherapy administration which is secondary to Neulasta support given on Day 2.  I provided him education regarding neulasta-induced bone pain.  He reports that the pain lasted about 3 days and was severe for him.  He took 1/4 of a Lortab for the pain and it was only minimally effective for the pain.  He did not take any NSAIDs for the pain which is typically more effective for Neulasta-induced pain.  He admits that when he took 1/2 Lortab tablet, it caused him to feel "drunk" and "tired."  At this time, I have offered him 2 options regarding Neulasta.  I recommended that he take Aleve 4 x per day starting the day following the injection x 2-3 days.  The other option is to switch to Neupogen for 5-7 days daily.  This typically does not cause as much bone pain.    He wants to discuss both of these options with his wife before making a decision.   Otherwise, he is doing well.  He remains in high spirits and maintains his humor which is obvious during conversation.   Oncologically, he denies any complaints and ROS questioning is negative.     Past Medical History  Diagnosis Date  . Tracheostomy in place   . Cancer 2010    laryngeal  . Lung cancer 2010  . Pneumonia 2014  . Dysrhythmia     takes Coreg and Lisinopril daily  . Shortness of breath     with  exertion  . Enlarged prostate     takes flomax  . Urinary urgency   . Gastric ulcer     hx of  . Hx of transfusion of packed red blood cells     no abnormal reaction noted by patient  . Umbilical hernia   . Metastatic cancer 07/24/2012    has TOBACCO ABUSE; EAR PAIN, LEFT; C O P D; NECK PAIN, LEFT; POSTNASAL DRIP SYNDROME; UNSPECIFIED RESPIRATORY ABNORMALITY; Acute respiratory failure; Community acquired pneumonia; Acute renal failure; Dehydration; History of laryngeal cancer; History of lung cancer; Tracheostomy in place; Elevated brain natriuretic peptide (BNP) level; Pulmonary nodules; Bilateral hydronephrosis; Cardiomyopathy; and Metastatic cancer on his problem list.     has No Known Allergies.  Jeffrey [name] does not currently have medications on file.  Past Surgical History  Procedure Laterality Date  . Tracheostomy  2010  . Lung removal, partial  2010    left side  . Layngectomy for laryngeal cancer in2010; left upper lobectomy for lung cancer, 2010.    . Back surgery      thinks it was done in 2012  . Esophagogastroduodenoscopy    . Colonoscopy    . Portacath placement Bilateral 07/12/2012    Procedure: INSERTION PORT-A-CATH;  Surgeon: Kerin Perna, MD;  Location: [facility] OR;  Service: Thoracic;  Laterality: Bilateral;    Denies any headaches, dizziness,  double vision, fevers, chills, night sweats, nausea, vomiting, diarrhea, constipation, chest pain, heart palpitations, shortness of breath, blood in stool, black tarry stool, urinary pain, urinary burning, urinary frequency, hematuria.   PHYSICAL EXAMINATION  ECOG PERFORMANCE STATUS: 1 - Symptomatic but completely ambulatory  There were no vitals filed for this visit.  GENERAL:alert, no distress, well nourished, well developed, comfortable, cooperative and smiling, tracheostomy  SKIN: skin color, texture, turgor are normal, no rashes or significant lesions  HEAD: Normocephalic, No masses, lesions, tenderness or  abnormalities  EYES: normal, PERRLA, EOMI, Conjunctiva are pink and non-injected  EARS: External ears normal  OROPHARYNX:mucous membranes are moist  NECK: supple, no adenopathy, thyroid normal size, non-tender, without nodularity, no stridor, non-tender, trachea midline  LYMPH: no palpable lymphadenopathy  BREAST:not examined  LUNGS: B/L inspiratory wheezing and decreased breath sounds  HEART: regular rate & rhythm, no murmurs, no gallops, S1 normal and S2 normal  ABDOMEN:abdomen soft, non-tender and normal bowel sounds  BACK: Back symmetric, no curvature.  EXTREMITIES:less then 2 second capillary refill, no joint deformities, effusion, or inflammation, no edema, no skin discoloration, no clubbing, no cyanosis  NEURO: alert & oriented x 3 with fluent speech, no focal motor/sensory deficits, gait normal    LABORATORY DATA: CBC    Component Value Date/Time   WBC 4.8 08/20/2012 0848   RBC 3.80* 08/20/2012 0848   HGB 12.0* 08/20/2012 0848   HCT 35.1* 08/20/2012 0848   PLT 264 08/20/2012 0848   MCV 92.4 08/20/2012 0848   MCH 31.6 08/20/2012 0848   MCHC 34.2 08/20/2012 0848   RDW 13.9 08/20/2012 0848   LYMPHSABS 0.2* 08/20/2012 0848   MONOABS 0.1 08/20/2012 0848   EOSABS 0.0 08/20/2012 0848   BASOSABS 0.0 08/20/2012 0848      Chemistry      Component Value Date/Time   NA 134* 08/20/2012 0848   K 5.0 08/20/2012 0848   CL 101 08/20/2012 0848   CO2 25 08/20/2012 0848   BUN 29* 08/20/2012 0848   CREATININE 1.35 08/20/2012 0848   CREATININE 1.26 02/09/2012 1046      Component Value Date/Time   CALCIUM 8.9 08/20/2012 0848   CALCIUM 8.5 01/26/2012 1312   ALKPHOS 70 08/20/2012 0848   AST 13 08/20/2012 0848   ALT 11 08/20/2012 0848   BILITOT 0.2* 08/20/2012 0848        PENDING LABS: Pre-chemo labs: CBC diff, CMET    ASSESSMENT:  1. Metastatic cancer, likely lung primary. S/P port-a-cath on 07/13/2012. To begin palliative chemotherapy consisting of carboplatin/paclitaxel in the near future.  2. Cardiomyopathy with a  decreased ejection fraction in the 15-20% range area  3. Neulasta-induce bone pain, starting on D3 or D4 of cycle and lasting 2-3 days causing severe bone pain.  Patient Active Problem List   Diagnosis Date Noted  . Metastatic cancer 07/24/2012  . Cardiomyopathy 01/27/2012  . History of laryngeal cancer 01/26/2012  . History of lung cancer 01/26/2012  . Tracheostomy in place 01/26/2012  . Elevated brain natriuretic peptide (BNP) level 01/26/2012  . Pulmonary nodules 01/26/2012  . Bilateral hydronephrosis 01/26/2012  . Acute respiratory failure 01/24/2012  . Community acquired pneumonia 01/24/2012  . Acute renal failure 01/24/2012  . Dehydration 01/24/2012  . TOBACCO ABUSE 02/28/2008  . EAR PAIN, LEFT 02/28/2008  . C O P D 02/28/2008  . NECK PAIN, LEFT 02/28/2008  . POSTNASAL DRIP SYNDROME 02/28/2008  . UNSPECIFIED RESPIRATORY ABNORMALITY 02/28/2008     PLAN:  1. I personally reviewed and  went over laboratory results with the patient. 2. Restaging CT of chest scheduled for 09/24/2012 following cycle 3 of chemotherapy.  3. Move on with cycle 3 of chemotherapy today as scheduled.  4. Pre-chemo labs pending today. 5. Patient education regarding Neulasta-induced bone pain. 6. Recommended improved pain control versus switch from neulasta to Neupogen 5-7 days. Patient will dicuss options with wife and I will addend my note below appropriately.  7. Return as scheduled following CT of chest for discussion about response and treatment options.    THERAPY PLAN:  Will administer cycle 3 of chemotherapy today pending pre-chemo labs.  He will then undergo restaging CT of chest as scheduled followed by a follow-up appointment.   All questions were answered. The patient knows to call the clinic with any problems, questions or concerns. We can certainly see the patient much sooner if necessary.  Patient and plan discussed with Dr. Erline Hau and he is in agreement with the aforementioned.     Idalie Canto  Addendum:  The patient has opted for continued neulasta supportive therapy.  Nursing provided him education on Aleve 2 tabs TID starting the day of Neulasta injection and continue for 3-5 days.  Venus Ruhe

## 2012-09-10 NOTE — Patient Instructions (Addendum)
[facility] Discharge Instructions  RECOMMENDATIONS MADE BY THE CONSULTANT AND ANY TEST RESULTS WILL BE SENT TO YOUR REFERRING PHYSICIAN.  EXAM FINDINGS BY THE PHYSICIAN TODAY AND SIGNS OR SYMPTOMS TO REPORT TO CLINIC OR PRIMARY PHYSICIAN: Exam and discussion by Dellis Anes, PA-C.    MEDICATIONS PRESCRIBED:  Aleve take 1 tablet 4 times daily starting day of Neulasta for 2 - 3 days.  If you decide you want to take the Neupogen instead, let us know.  INSTRUCTIONS GIVEN AND DISCUSSED: Report fevers, chills, uncontrolled nausea, vomiting or other problems.  SPECIAL INSTRUCTIONS/FOLLOW-UP: Follow-up as scheduled.  Thank you for choosing [facility] to provide your oncology and hematology care.  To afford each patient quality time with our providers, please arrive at least 15 minutes before your scheduled appointment time.  With your help, our goal is to use those 15 minutes to complete the necessary work-up to ensure our physicians have the information they need to help with your evaluation and healthcare recommendations.    Effective January 1st, 2014, we ask that you re-schedule your appointment with our physicians should you arrive 10 or more minutes late for your appointment.  We strive to give you quality time with our providers, and arriving late affects you and other patients whose appointments are after yours.    Again, thank you for choosing [facility].  Our hope is that these requests will decrease the amount of time that you wait before being seen by our physicians.       _____________________________________________________________  Should you have questions after your visit to [facility], please contact our office at [phone] between the hours of 8:30 a.m. and 5:00 p.m.  Voicemails left after 4:30 p.m. will not be returned until the following business day.  For prescription refill requests, have your pharmacy  contact our office with your prescription refill request.

## 2012-09-11 ENCOUNTER — Encounter (HOSPITAL_BASED_OUTPATIENT_CLINIC_OR_DEPARTMENT_OTHER): Payer: Managed Care, Other (non HMO)

## 2012-09-11 VITALS — BP 132/77 | HR 87 | Temp 97.9°F | Resp 18

## 2012-09-11 DIAGNOSIS — Z5189 Encounter for other specified aftercare: Secondary | ICD-10-CM

## 2012-09-11 DIAGNOSIS — C801 Malignant (primary) neoplasm, unspecified: Secondary | ICD-10-CM

## 2012-09-11 DIAGNOSIS — Z85118 Personal history of other malignant neoplasm of bronchus and lung: Secondary | ICD-10-CM

## 2012-09-11 DIAGNOSIS — C799 Secondary malignant neoplasm of unspecified site: Secondary | ICD-10-CM

## 2012-09-11 DIAGNOSIS — R918 Other nonspecific abnormal finding of lung field: Secondary | ICD-10-CM

## 2012-09-11 MED ORDER — PEGFILGRASTIM INJECTION 6 MG/0.6ML
6.0000 mg | Freq: Once | SUBCUTANEOUS | Status: AC
  Administered 2012-09-11: 6 mg via SUBCUTANEOUS

## 2012-09-11 MED ORDER — PEGFILGRASTIM INJECTION 6 MG/0.6ML
SUBCUTANEOUS | Status: AC
  Filled 2012-09-11: qty 0.6

## 2012-09-11 NOTE — Progress Notes (Signed)
Jeffrey [name] presents today for injection per MD orders. Neulasta 6mg  administered SQ in right Abdomen. Administration without incident. Patient tolerated well.

## 2012-09-19 ENCOUNTER — Other Ambulatory Visit: Payer: Self-pay | Admitting: *Deleted

## 2012-09-19 MED ORDER — LISINOPRIL 5 MG PO TABS: 5.0000 mg | ORAL_TABLET | Freq: Every day | ORAL | Status: DC

## 2012-09-21 ENCOUNTER — Other Ambulatory Visit (HOSPITAL_COMMUNITY): Payer: Managed Care, Other (non HMO)

## 2012-09-24 ENCOUNTER — Ambulatory Visit (HOSPITAL_COMMUNITY): Payer: Managed Care, Other (non HMO)

## 2012-09-24 ENCOUNTER — Ambulatory Visit (HOSPITAL_COMMUNITY)
Admission: RE | Admit: 2012-09-24 | Discharge: 2012-09-24 | Disposition: A | Discharge status: Home / Self Care | Payer: Managed Care, Other (non HMO) | Source: Ambulatory Visit | Attending: Oncology | Admitting: Oncology

## 2012-09-24 DIAGNOSIS — R918 Other nonspecific abnormal finding of lung field: Secondary | ICD-10-CM | POA: Insufficient documentation

## 2012-09-24 DIAGNOSIS — Z85118 Personal history of other malignant neoplasm of bronchus and lung: Secondary | ICD-10-CM | POA: Insufficient documentation

## 2012-09-24 DIAGNOSIS — Z8521 Personal history of malignant neoplasm of larynx: Secondary | ICD-10-CM | POA: Insufficient documentation

## 2012-09-24 DIAGNOSIS — R599 Enlarged lymph nodes, unspecified: Secondary | ICD-10-CM | POA: Insufficient documentation

## 2012-09-24 DIAGNOSIS — C799 Secondary malignant neoplasm of unspecified site: Secondary | ICD-10-CM

## 2012-09-24 MED ORDER — IOHEXOL 300 MG/ML  SOLN
80.0000 mL | Freq: Once | INTRAMUSCULAR | Status: AC | PRN
  Administered 2012-09-24: 80 mL via INTRAVENOUS

## 2012-09-26 ENCOUNTER — Encounter (HOSPITAL_COMMUNITY): Payer: Self-pay

## 2012-09-26 ENCOUNTER — Encounter (HOSPITAL_COMMUNITY): Payer: Managed Care, Other (non HMO) | Attending: Oncology

## 2012-09-26 VITALS — BP 119/76 | HR 92 | Temp 98.0°F | Resp 16 | Wt 140.8 lb

## 2012-09-26 DIAGNOSIS — C7800 Secondary malignant neoplasm of unspecified lung: Secondary | ICD-10-CM

## 2012-09-26 DIAGNOSIS — R918 Other nonspecific abnormal finding of lung field: Secondary | ICD-10-CM | POA: Insufficient documentation

## 2012-09-26 DIAGNOSIS — C7839 Secondary malignant neoplasm of other respiratory organs: Secondary | ICD-10-CM

## 2012-09-26 DIAGNOSIS — C801 Malignant (primary) neoplasm, unspecified: Secondary | ICD-10-CM | POA: Insufficient documentation

## 2012-09-26 DIAGNOSIS — C799 Secondary malignant neoplasm of unspecified site: Secondary | ICD-10-CM

## 2012-09-26 DIAGNOSIS — Z85118 Personal history of other malignant neoplasm of bronchus and lung: Secondary | ICD-10-CM | POA: Insufficient documentation

## 2012-09-26 NOTE — Patient Instructions (Addendum)
[facility] Discharge Instructions  RECOMMENDATIONS MADE BY THE CONSULTANT AND ANY TEST RESULTS WILL BE SENT TO YOUR REFERRING PHYSICIAN.  EXAM FINDINGS BY THE PHYSICIAN TODAY AND SIGNS OR SYMPTOMS TO REPORT TO CLINIC OR PRIMARY PHYSICIAN: Exam and discussion by Dr. Lazarus Salines.  Your scans show that your chemotherapy is working and we will continue treatments.  We will stop the Neulasta for now.  MEDICATIONS PRESCRIBED:  none  INSTRUCTIONS GIVEN AND DISCUSSED: Report fevers, chills, uncontrolled nausea or vomiting or other problems.  SPECIAL INSTRUCTIONS/FOLLOW-UP: See schedule.  Thank you for choosing [facility] to provide your oncology and hematology care.  To afford each patient quality time with our providers, please arrive at least 15 minutes before your scheduled appointment time.  With your help, our goal is to use those 15 minutes to complete the necessary work-up to ensure our physicians have the information they need to help with your evaluation and healthcare recommendations.    Effective January 1st, 2014, we ask that you re-schedule your appointment with our physicians should you arrive 10 or more minutes late for your appointment.  We strive to give you quality time with our providers, and arriving late affects you and other patients whose appointments are after yours.    Again, thank you for choosing [facility].  Our hope is that these requests will decrease the amount of time that you wait before being seen by our physicians.       _____________________________________________________________  Should you have questions after your visit to [facility], please contact our office at [phone] between the hours of 8:30 a.m. and 5:00 p.m.  Voicemails left after 4:30 p.m. will not be returned until the following business day.  For prescription refill requests, have your pharmacy contact our office with your prescription  refill request.

## 2012-09-26 NOTE — Progress Notes (Signed)
[facility] [phone]   [phone]  OFFICE PROGRESS NOTE  Jeffrey Asa, MD 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 male returns to the clinic today for scheduled follow up.  He was diagnosed with laryngeal cancer that was resected in 2010.  He had laryngeal biopsy and left upper lobectomy on 03/17/2008 and pathology showed; 1. LARYNX, SUPRAGLOTTIC, BIOPSY: - INVASIVE SQUAMOUS CELL CARCINOMA, SEE COMMENT.  2. LYMPH NODE, AP WINDOW, RESECTION: - NO TUMOR IDENTIFIED.  3. LYMPH NODE, LEFT UPPER LOBE, EXCISION: - NO TUMOR IDENTIFIED.  4. LEFT LUNG, UPPER LOBE, LOBECTOMY: - INVASIVE SQUAMOUS CELL CARCINOMA, 4.5 CM. - NEGATIVE SURGICAL MARGINS OF RESECTION.  Subsequently laryngectomy on 05/06/2008 showed; 1. LYMPH NODE, PRETRACHEAL, EXCISIONAL BIOPSY: ONE LYMPH NODE, NEGATIVE FOR METASTATIC CARCINOMA (0/1)  2. LARYNX, LARYNGECTOMY: - INVASIVE MODERATELY DIFFERENTIATED SQUAMOUS CELL CARCINOMA, INVADING INTO CARTILAGE AND INVOLVING MUSCULAR TISSUE. - ALL RESECTIONS MARGINS ARE NEGATIVE - SEE ONCOLOGY FOR DETAILS;  Patient subsequently developed multiple pulmonary nodules and palliative intent chemotherapy with carboplatin and Taxol was considered. This was started on 07/31/2012 and then as at 09/11/2012 patient has completed 2 cycles successfully.  He has tolerated chemotherapy well except for severe bone pain which he states last for one to 2 weeks after Neulasta injections.  Has not had any documented febrile neutropenic episode or delay in treatment neutropenia  He denies shortness of breath or pain. He also denies nausea or vomiting.  He did state that he has plenty of mucous but this is not new.  He continues to smoke one pack of cigarettes a day.  Uses a device to make his voice audible. He was accompanied by his wife.  MEDICAL HISTORY: Past  Medical History  Diagnosis Date  . Tracheostomy in place   . Cancer 2010    laryngeal  . Lung cancer 2010  . Pneumonia 2014  . Dysrhythmia     takes Coreg and Lisinopril daily  . Shortness of breath     with exertion  . Enlarged prostate     takes flomax  . Urinary urgency   . Gastric ulcer     hx of  . Hx of transfusion of packed red blood cells     no abnormal reaction noted by patient  . Umbilical hernia   . Metastatic cancer 07/24/2012    ALLERGIES:  has No Known Allergies.  MEDICATIONS:  Current Outpatient Prescriptions  Medication Sig Dispense Refill  . aspirin EC 81 MG EC tablet Take 1 tablet (81 mg total) by mouth daily.  30 tablet  1  . Bisacodyl (LAXATIVE PO) Take by mouth as needed.      [name] Kitchen CARBOPLATIN IV Inject into the vein every 21 ( twenty-one) days.      . carvedilol (COREG) 3.125 MG tablet TAKE ONE TABLET BY MOUTH TWICE DAILY WITH  A MEAL  60 tablet  0  . dexamethasone (DECADRON) 4 MG tablet At 9pm the night before chemo take  2 tablets. Then at 3am the morning of chemo, take 2 tablets. Starting the day after chemo, take 2 tablets in the am and 2 tablets in the pm for 2 days. Take with food.      [name] Kitchen HYDROcodone-acetaminophen (NORCO/VICODIN) 5-325 MG per tablet Take 0.25-0.5 tablets by mouth 2 (two) times daily as needed. *Must last 30 days*      .  lidocaine-prilocaine (EMLA) cream Apply a quarter size amount to port site 1 hour prior to chemo. Do not rub in. Cover with plastic.  30 g  3  . lisinopril (PRINIVIL,ZESTRIL) 5 MG tablet Take 1 tablet (5 mg total) by mouth daily.  30 tablet  1  . LORazepam (ATIVAN) 0.5 MG tablet Take 1 tablet (0.5 mg total) by mouth every 6 (six) hours as needed (Nausea or vomiting).  30 tablet  0  . LORazepam (ATIVAN) 1 MG tablet TAKE ONE TABLET BY MOUTH AT BEDTIME AS NEEDED FOR SLEEP ** MUST LAST 30 DAYS **  30 tablet  2  . Naproxen Sodium (ALEVE PO) Take 200 mg by mouth as needed.      . ondansetron (ZOFRAN) 8 MG tablet Starting the day  after chemo, take 1 tablet in the am and 1 tablet in the pm for 2 days. Then may take 1 tablet two times a day IF needed for nausea/vomiting.  30 tablet  1  . PACLitaxel (TAXOL IV) Inject into the vein every 21 ( twenty-one) days.      . pegfilgrastim (NEULASTA) 6 MG/0.6ML injection Inject 6 mg into the skin every 21 ( twenty-one) days.      . tamsulosin (FLOMAX) 0.4 MG CAPS Take 0.4 mg by mouth daily.        No current facility-administered medications for this visit.    SURGICAL HISTORY:  Past Surgical History  Procedure Laterality Date  . Tracheostomy  2010  . Lung removal, partial  2010    left side  . Layngectomy for laryngeal cancer in2010; left upper lobectomy for lung cancer, 2010.    . Back surgery      thinks it was done in 2012  . Esophagogastroduodenoscopy    . Colonoscopy    . Portacath placement Bilateral 07/12/2012    Procedure: INSERTION PORT-A-CATH;  Surgeon: Kerin Perna, MD;  Location: [facility] OR;  Service: Thoracic;  Laterality: Bilateral;     REVIEW OF SYSTEMS: 14 point review of system is as in the history above otherwise negative.  PHYSICAL EXAMINATION:  Blood pressure 119/76, pulse 92, temperature 98 F (36.7 C), temperature source Oral, resp. rate 16, weight 140 lb 12.8 oz (63.866 kg). GENERAL: No acute distress. SKIN:  No rashes or significant lesions . No ecchymosis or petechial rash. HEAD: Normocephalic, No masses, lesions, tenderness or abnormalities  EYES: Conjunctiva are pink and non-injected and no jaundice ENT: External ears normal ,lips, buccal mucosa, and tongue normal and mucous membranes are moist . No evidence of thrush.Tracheostomy stoma is in place. LYMPH: No palpable lymphadenopathy, in the neck, supraclavicular areas or axilla. LUNGS: markedly decreased breath sounds bilaterally. HEART: regular rate & rhythm, no murmurs, no gallops, S1 normal and S2 normal and no S3. ABDOMEN: Abdomen soft, non-tender, no masses or organomegaly and no  hepatosplenomegaly palpable EXTREMITIES: No edema, no skin discoloration or tenderness NEURO: Alert & oriented , no focal motor  deficits.     LABORATORY DATA: Lab Results  Component Value Date   WBC 4.3 09/10/2012   HGB 11.9* 09/10/2012   HCT 35.1* 09/10/2012   MCV 92.9 09/10/2012   PLT 206 09/10/2012      Chemistry      Component Value Date/Time   NA 134* 09/10/2012 0840   K 5.0 09/10/2012 0840   CL 100 09/10/2012 0840   CO2 24 09/10/2012 0840   BUN 23 09/10/2012 0840   CREATININE 1.46* 09/10/2012 0840   CREATININE 1.26 02/09/2012 1046  Component Value Date/Time   CALCIUM 9.0 09/10/2012 0840   CALCIUM 8.5 01/26/2012 1312   ALKPHOS 74 09/10/2012 0840   AST 13 09/10/2012 0840   ALT 8 09/10/2012 0840   BILITOT 0.3 09/10/2012 0840       RADIOGRAPHIC STUDIES: Ct Chest W Contrast 09/24/2012   IMPRESSION: Status post left upper lobectomy.  14 mm spiculated right upper lobe pulmonary nodule, decreased. Additional right middle lobe and left lower lobe pulmonary nodules/metastases have essentially resolved.  Mild patchy opacity in the medial right middle lobe, likely infectious/inflammatory. 14 mm irregular patchy/nodular opacity at the left lung base, new, possibly infectious/inflammatory.  Borderline enlarged mediastinal lymphadenopathy, as described above.   Original Report Authenticated By: Charline Bills, M.D.     ASSESSMENT:  Mr. Gosline has partial response to carboplatin/Taxol for suspected metastatic laryngeal cancer versus lung primary.  Given that head and neck cancer tends is spread to the lungs and also the concept of field cancerization , a case can be made  for both.   PLAN:  1. Continued carbo/Taxol with the intent of giving 6 cycles and repeat his scans. 2. I will hold Neulasta for the next cycle see if patient can do without it. Subsequently, will hold if no serious indication and possibly discontinue it permanently. There has been reports of bone marrow necrosis  associated with G-CSF which in most cases is heralded by severe bone pain. 3. He will return to clinic in one month which will be cycle 5.  We'll proceed with cycle 4 next week as scheduled. 4. Was counseled on smoking cessation.    All questions were satisfactorily answered. Patient knows to call if  any concern arises.  I spent more than 50 % counseling the patient face to face. The total time spent in the appointment was 30 minutes.   Sherral Hammers, MD FACP. Hematology/Oncology.

## 2012-09-28 ENCOUNTER — Other Ambulatory Visit (HOSPITAL_COMMUNITY): Payer: Self-pay | Admitting: Oncology

## 2012-10-01 ENCOUNTER — Encounter (HOSPITAL_BASED_OUTPATIENT_CLINIC_OR_DEPARTMENT_OTHER): Payer: Managed Care, Other (non HMO)

## 2012-10-01 VITALS — BP 130/83 | HR 81 | Temp 98.4°F

## 2012-10-01 DIAGNOSIS — C799 Secondary malignant neoplasm of unspecified site: Secondary | ICD-10-CM

## 2012-10-01 DIAGNOSIS — C801 Malignant (primary) neoplasm, unspecified: Secondary | ICD-10-CM

## 2012-10-01 DIAGNOSIS — Z5111 Encounter for antineoplastic chemotherapy: Secondary | ICD-10-CM

## 2012-10-01 DIAGNOSIS — R918 Other nonspecific abnormal finding of lung field: Secondary | ICD-10-CM

## 2012-10-01 DIAGNOSIS — Z85118 Personal history of other malignant neoplasm of bronchus and lung: Secondary | ICD-10-CM

## 2012-10-01 LAB — COMPREHENSIVE METABOLIC PANEL
ALT: 9 U/L (ref 0–53)
BUN: 23 mg/dL (ref 6–23)
Calcium: 9.3 mg/dL (ref 8.4–10.5)
GFR calc Af Amer: 52 mL/min — ABNORMAL LOW (ref >90)
Glucose, Bld: 154 mg/dL — ABNORMAL HIGH (ref 70–99)
Sodium: 135 mEq/L (ref 135–145)
Total Protein: 7 g/dL (ref 6.0–8.3)

## 2012-10-01 LAB — CBC WITH DIFFERENTIAL/PLATELET
Basophils Relative: 0 % (ref 0–1)
Eosinophils Absolute: 0 10*3/uL (ref 0.0–0.7)
Eosinophils Relative: 0 % (ref 0–5)
Lymphs Abs: 0.3 10*3/uL — ABNORMAL LOW (ref 0.7–4.0)
MCH: 31.1 pg (ref 26.0–34.0)
MCHC: 33.3 g/dL (ref 30.0–36.0)
MCV: 93.3 fL (ref 78.0–100.0)
Platelets: 262 10*3/uL (ref 150–400)
RBC: 3.44 MIL/uL — ABNORMAL LOW (ref 4.22–5.81)
RDW: 15.7 % — ABNORMAL HIGH (ref 11.5–15.5)

## 2012-10-01 MED ORDER — SODIUM CHLORIDE 0.9 % IV SOLN
Freq: Once | INTRAVENOUS | Status: AC
  Administered 2012-10-01: 16 mg via INTRAVENOUS
  Filled 2012-10-01: qty 8

## 2012-10-01 MED ORDER — DIPHENHYDRAMINE HCL 50 MG/ML IJ SOLN
50.0000 mg | Freq: Once | INTRAMUSCULAR | Status: AC
  Administered 2012-10-01: 50 mg via INTRAVENOUS

## 2012-10-01 MED ORDER — SODIUM CHLORIDE 0.9 % IV SOLN: 16.0000 mg | Freq: Once | INTRAVENOUS | Status: DC

## 2012-10-01 MED ORDER — SODIUM CHLORIDE 0.9 % IV SOLN
332.5000 mg | Freq: Once | INTRAVENOUS | Status: AC
  Administered 2012-10-01: 330 mg via INTRAVENOUS
  Filled 2012-10-01: qty 33

## 2012-10-01 MED ORDER — FAMOTIDINE IN NACL 20-0.9 MG/50ML-% IV SOLN
INTRAVENOUS | Status: AC
  Filled 2012-10-01: qty 50

## 2012-10-01 MED ORDER — DEXTROSE 5 % IV SOLN
175.0000 mg/m2 | Freq: Once | INTRAVENOUS | Status: AC
  Administered 2012-10-01: 300 mg via INTRAVENOUS
  Filled 2012-10-01: qty 50

## 2012-10-01 MED ORDER — DIPHENHYDRAMINE HCL 50 MG/ML IJ SOLN
INTRAMUSCULAR | Status: AC
  Filled 2012-10-01: qty 1

## 2012-10-01 MED ORDER — DEXAMETHASONE SODIUM PHOSPHATE 10 MG/ML IJ SOLN
20.0000 mg | Freq: Once | INTRAMUSCULAR | Status: DC
Start: 2012-10-01 — End: 2012-10-01

## 2012-10-01 MED ORDER — HEPARIN SOD (PORK) LOCK FLUSH 100 UNIT/ML IV SOLN
500.0000 [IU] | Freq: Once | INTRAVENOUS | Status: AC | PRN
  Administered 2012-10-01: 500 [IU]
  Filled 2012-10-01: qty 5

## 2012-10-01 MED ORDER — SODIUM CHLORIDE 0.9 % IJ SOLN
10.0000 mL | INTRAMUSCULAR | Status: DC | PRN
  Administered 2012-10-01: 10 mL
  Filled 2012-10-01: qty 10

## 2012-10-01 MED ORDER — SODIUM CHLORIDE 0.9 % IV SOLN
Freq: Once | INTRAVENOUS | Status: AC
  Administered 2012-10-01: 10:00:00 via INTRAVENOUS

## 2012-10-01 MED ORDER — FAMOTIDINE IN NACL 20-0.9 MG/50ML-% IV SOLN
20.0000 mg | Freq: Once | INTRAVENOUS | Status: AC
  Administered 2012-10-01: 20 mg via INTRAVENOUS

## 2012-10-01 MED ORDER — HEPARIN SOD (PORK) LOCK FLUSH 100 UNIT/ML IV SOLN
INTRAVENOUS | Status: AC
  Filled 2012-10-01: qty 5

## 2012-10-01 NOTE — Progress Notes (Signed)
Tolerated well

## 2012-10-08 ENCOUNTER — Encounter (HOSPITAL_BASED_OUTPATIENT_CLINIC_OR_DEPARTMENT_OTHER): Payer: Managed Care, Other (non HMO)

## 2012-10-08 DIAGNOSIS — C801 Malignant (primary) neoplasm, unspecified: Secondary | ICD-10-CM

## 2012-10-08 DIAGNOSIS — C799 Secondary malignant neoplasm of unspecified site: Secondary | ICD-10-CM

## 2012-10-08 LAB — CBC WITH DIFFERENTIAL/PLATELET
Eosinophils Absolute: 0.2 10*3/uL (ref 0.0–0.7)
Eosinophils Relative: 3 % (ref 0–5)
Hemoglobin: 10.3 g/dL — ABNORMAL LOW (ref 13.0–17.0)
Lymphs Abs: 0.4 10*3/uL — ABNORMAL LOW (ref 0.7–4.0)
MCH: 31.6 pg (ref 26.0–34.0)
MCV: 93.3 fL (ref 78.0–100.0)
Monocytes Absolute: 0.1 10*3/uL (ref 0.1–1.0)
Monocytes Relative: 2 % — ABNORMAL LOW (ref 3–12)
RBC: 3.26 MIL/uL — ABNORMAL LOW (ref 4.22–5.81)

## 2012-10-08 NOTE — Progress Notes (Signed)
Labs drawn today for cbc/diff 

## 2012-10-10 ENCOUNTER — Other Ambulatory Visit (HOSPITAL_COMMUNITY): Payer: Self-pay | Admitting: Oncology

## 2012-10-15 ENCOUNTER — Encounter (HOSPITAL_BASED_OUTPATIENT_CLINIC_OR_DEPARTMENT_OTHER): Payer: Managed Care, Other (non HMO)

## 2012-10-15 DIAGNOSIS — C801 Malignant (primary) neoplasm, unspecified: Secondary | ICD-10-CM

## 2012-10-15 DIAGNOSIS — C799 Secondary malignant neoplasm of unspecified site: Secondary | ICD-10-CM

## 2012-10-15 LAB — CBC WITH DIFFERENTIAL/PLATELET
Basophils Absolute: 0.1 10*3/uL (ref 0.0–0.1)
Eosinophils Absolute: 0.1 10*3/uL (ref 0.0–0.7)
Eosinophils Relative: 3 % (ref 0–5)
Lymphs Abs: 0.5 10*3/uL — ABNORMAL LOW (ref 0.7–4.0)
MCH: 31.3 pg (ref 26.0–34.0)
MCV: 94.3 fL (ref 78.0–100.0)
Platelets: 165 10*3/uL (ref 150–400)
RDW: 16.3 % — ABNORMAL HIGH (ref 11.5–15.5)

## 2012-10-15 NOTE — Progress Notes (Signed)
Labs drawn today for cbc/diff 

## 2012-10-22 ENCOUNTER — Encounter (HOSPITAL_BASED_OUTPATIENT_CLINIC_OR_DEPARTMENT_OTHER): Payer: Managed Care, Other (non HMO)

## 2012-10-22 ENCOUNTER — Encounter (HOSPITAL_COMMUNITY): Payer: Managed Care, Other (non HMO) | Attending: Oncology

## 2012-10-22 ENCOUNTER — Encounter (HOSPITAL_COMMUNITY): Payer: Self-pay

## 2012-10-22 VITALS — BP 143/86 | HR 87 | Temp 98.4°F | Wt 140.4 lb

## 2012-10-22 VITALS — BP 134/92 | HR 84 | Temp 97.7°F | Resp 20

## 2012-10-22 DIAGNOSIS — C799 Secondary malignant neoplasm of unspecified site: Secondary | ICD-10-CM

## 2012-10-22 DIAGNOSIS — R609 Edema, unspecified: Secondary | ICD-10-CM

## 2012-10-22 DIAGNOSIS — C329 Malignant neoplasm of larynx, unspecified: Secondary | ICD-10-CM | POA: Insufficient documentation

## 2012-10-22 DIAGNOSIS — C3490 Malignant neoplasm of unspecified part of unspecified bronchus or lung: Secondary | ICD-10-CM

## 2012-10-22 DIAGNOSIS — Z5111 Encounter for antineoplastic chemotherapy: Secondary | ICD-10-CM

## 2012-10-22 DIAGNOSIS — R918 Other nonspecific abnormal finding of lung field: Secondary | ICD-10-CM | POA: Insufficient documentation

## 2012-10-22 DIAGNOSIS — C50919 Malignant neoplasm of unspecified site of unspecified female breast: Secondary | ICD-10-CM

## 2012-10-22 DIAGNOSIS — C7800 Secondary malignant neoplasm of unspecified lung: Secondary | ICD-10-CM

## 2012-10-22 DIAGNOSIS — Z85118 Personal history of other malignant neoplasm of bronchus and lung: Secondary | ICD-10-CM

## 2012-10-22 DIAGNOSIS — C801 Malignant (primary) neoplasm, unspecified: Secondary | ICD-10-CM

## 2012-10-22 DIAGNOSIS — J449 Chronic obstructive pulmonary disease, unspecified: Secondary | ICD-10-CM

## 2012-10-22 LAB — COMPREHENSIVE METABOLIC PANEL
ALT: 6 U/L (ref 0–53)
AST: 16 U/L (ref 0–37)
Albumin: 3.4 g/dL — ABNORMAL LOW (ref 3.5–5.2)
CO2: 25 mEq/L (ref 19–32)
Calcium: 8.9 mg/dL (ref 8.4–10.5)
GFR calc non Af Amer: 43 mL/min — ABNORMAL LOW (ref >90)
Sodium: 137 mEq/L (ref 135–145)
Total Protein: 7 g/dL (ref 6.0–8.3)

## 2012-10-22 LAB — CBC WITH DIFFERENTIAL/PLATELET
Basophils Absolute: 0 10*3/uL (ref 0.0–0.1)
Basophils Relative: 1 % (ref 0–1)
Eosinophils Relative: 2 % (ref 0–5)
Lymphocytes Relative: 12 % (ref 12–46)
MCV: 95 fL (ref 78.0–100.0)
Platelets: 125 10*3/uL — ABNORMAL LOW (ref 150–400)
RDW: 16.4 % — ABNORMAL HIGH (ref 11.5–15.5)
WBC: 3.5 10*3/uL — ABNORMAL LOW (ref 4.0–10.5)

## 2012-10-22 MED ORDER — SODIUM CHLORIDE 0.9 % IJ SOLN
10.0000 mL | INTRAMUSCULAR | Status: DC | PRN
  Administered 2012-10-22: 10 mL

## 2012-10-22 MED ORDER — SODIUM CHLORIDE 0.9 % IV SOLN: 16.0000 mg | Freq: Once | INTRAVENOUS | Status: DC

## 2012-10-22 MED ORDER — AZITHROMYCIN 250 MG PO TABS: ORAL_TABLET | ORAL | Status: DC

## 2012-10-22 MED ORDER — FAMOTIDINE IN NACL 20-0.9 MG/50ML-% IV SOLN
20.0000 mg | Freq: Once | INTRAVENOUS | Status: AC
  Administered 2012-10-22: 20 mg via INTRAVENOUS

## 2012-10-22 MED ORDER — DEXAMETHASONE SODIUM PHOSPHATE 10 MG/ML IJ SOLN: 20.0000 mg | Freq: Once | INTRAMUSCULAR | Status: DC

## 2012-10-22 MED ORDER — SODIUM CHLORIDE 0.9 % IV SOLN
330.0000 mg | Freq: Once | INTRAVENOUS | Status: AC
  Administered 2012-10-22: 330 mg via INTRAVENOUS
  Filled 2012-10-22: qty 33

## 2012-10-22 MED ORDER — PACLITAXEL CHEMO INJECTION 300 MG/50ML
175.0000 mg/m2 | Freq: Once | INTRAVENOUS | Status: AC
  Administered 2012-10-22: 300 mg via INTRAVENOUS
  Filled 2012-10-22: qty 50

## 2012-10-22 MED ORDER — SODIUM CHLORIDE 0.9 % IV SOLN
Freq: Once | INTRAVENOUS | Status: AC
  Administered 2012-10-22: 10:00:00 via INTRAVENOUS

## 2012-10-22 MED ORDER — DIPHENHYDRAMINE HCL 50 MG/ML IJ SOLN
50.0000 mg | Freq: Once | INTRAMUSCULAR | Status: AC
  Administered 2012-10-22: 50 mg via INTRAVENOUS

## 2012-10-22 MED ORDER — HEPARIN SOD (PORK) LOCK FLUSH 100 UNIT/ML IV SOLN
500.0000 [IU] | Freq: Once | INTRAVENOUS | Status: AC | PRN
  Administered 2012-10-22: 500 [IU]
  Filled 2012-10-22: qty 5

## 2012-10-22 MED ORDER — SODIUM CHLORIDE 0.9 % IV SOLN
Freq: Once | INTRAVENOUS | Status: AC
  Administered 2012-10-22: 16 mg via INTRAVENOUS
  Filled 2012-10-22: qty 8

## 2012-10-22 MED ORDER — DIPHENHYDRAMINE HCL 50 MG/ML IJ SOLN
INTRAMUSCULAR | Status: AC
  Filled 2012-10-22: qty 1

## 2012-10-22 MED ORDER — FAMOTIDINE IN NACL 20-0.9 MG/50ML-% IV SOLN
INTRAVENOUS | Status: AC
  Filled 2012-10-22: qty 50

## 2012-10-22 NOTE — Progress Notes (Signed)
Tolerated chemo well. 

## 2012-10-22 NOTE — Patient Instructions (Addendum)
[facility] Discharge Instructions for Patients Receiving Chemotherapy  Today you received the following chemotherapy agents carboplatin and taxol  To help prevent nausea and vomiting after your treatment, we encourage you to take your nausea medication.  Cycle 5 of carboplatin/Taxol today.   Prophylactic Z-Pak after each chemotherapy treatment.  Suggested obtaining a humidifier for his bedroom to prevent drying out of his tracheostomy stoma.   Followup in 3 weeks to receive cycle 6 of therapy of a planned 6.     If you develop nausea and vomiting that is not controlled by your nausea medication, call the clinic. If it is after clinic hours your family physician or the after hours number for the clinic or go to the Emergency Department.   BELOW ARE SYMPTOMS THAT SHOULD BE REPORTED IMMEDIATELY:  *FEVER GREATER THAN 101.0 F  *CHILLS WITH OR WITHOUT FEVER  NAUSEA AND VOMITING THAT IS NOT CONTROLLED WITH YOUR NAUSEA MEDICATION  *UNUSUAL SHORTNESS OF BREATH  *UNUSUAL BRUISING OR BLEEDING  TENDERNESS IN MOUTH AND THROAT WITH OR WITHOUT PRESENCE OF ULCERS  *URINARY PROBLEMS  *BOWEL PROBLEMS  UNUSUAL RASH Items with * indicate a potential emergency and should be followed up as soon as possible.  One of the nurses will contact you 24 hours after your treatment. Please let the nurse know about any problems that you may have experienced. Feel free to call the clinic you have any questions or concerns. The clinic phone number is [phone].   I have been informed and understand all the instructions given to me. I know to contact the clinic, my physician, or go to the Emergency Department if any problems should occur. I do not have any questions at this time, but understand that I may call the clinic during office hours or the Patient Navigator at [phone] should I have any questions or need assistance in obtaining follow up  care.    __________________________________________  _____________  __________ Signature of Patient or Authorized Representative            Date                   Time    __________________________________________ Nurse's Signature

## 2012-10-22 NOTE — Patient Instructions (Addendum)
[facility] Discharge Instructions  RECOMMENDATIONS MADE BY THE CONSULTANT AND ANY TEST RESULTS WILL BE SENT TO YOUR REFERRING PHYSICIAN.  EXAM FINDINGS BY THE PHYSICIAN TODAY AND SIGNS OR SYMPTOMS TO REPORT TO CLINIC OR PRIMARY PHYSICIAN: Exam and findings as discussed by Zigmund Daniel.  Will continue with chemotherapy.  Report fevers, chills, uncontrolled nausea or vomiting or other problems  MEDICATIONS PRESCRIBED:  none  INSTRUCTIONS/FOLLOW-UP: Follow-up in 3 weeks.  Thank you for choosing [facility] to provide your oncology and hematology care.  To afford each patient quality time with our providers, please arrive at least 15 minutes before your scheduled appointment time.  With your help, our goal is to use those 15 minutes to complete the necessary work-up to ensure our physicians have the information they need to help with your evaluation and healthcare recommendations.    Effective January 1st, 2014, we ask that you re-schedule your appointment with our physicians should you arrive 10 or more minutes late for your appointment.  We strive to give you quality time with our providers, and arriving late affects you and other patients whose appointments are after yours.    Again, thank you for choosing [facility].  Our hope is that these requests will decrease the amount of time that you wait before being seen by our physicians.       _____________________________________________________________  Should you have questions after your visit to [facility], please contact our office at [phone] between the hours of 8:30 a.m. and 5:00 p.m.  Voicemails left after 4:30 p.m. will not be returned until the following business day.  For prescription refill requests, have your pharmacy contact our office with your prescription refill request.

## 2012-10-22 NOTE — Progress Notes (Signed)
[facility] OFFICE PROGRESS NOTE  Jeffrey Asa, MD [address] [place] [address]  DIAGNOSIS: Recurrent lung squamous cell carcinoma, unspecified laterality  Chief Complaint  Patient presents with  . Lung Cancer  . Chemotherapy    CURRENT THERAPY: Carboplatin/Taxol every 3 weeks with Neulasta support, due for cycle #5 today  INTERVAL HISTORY: Jeffrey [name] 79 y.o. 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 He continues to do well except lower extremity swelling. He gets significant bone pain from Neulasta and it will therefore no longer be given with his chemotherapy. He will be placed on prophylactic antibiotics instead. He continues to cough up sputum from his tracheostomy. He denies a sore throat, worsening cough, fever, night sweats, abdominal pain, nausea, vomiting, diarrhea, constipation, melena, hematochezia, hematuria, headache, or seizures. Upper extremity paresthesias persist but he does not have difficulty buttoning buttons were writing. Today represents cycle 5 of a planned 6 having demonstrated improvement on most recent CT scan done on 09/24/2012.  MEDICAL HISTORY: Past Medical History  Diagnosis Date  . Tracheostomy in place   . Cancer 2010    laryngeal  . Lung cancer 2010  . Pneumonia 2014  . Dysrhythmia     takes Coreg and Lisinopril daily  . Shortness of breath     with exertion  . Enlarged prostate     takes flomax  . Urinary urgency   . Gastric ulcer     hx of  . Hx of transfusion of packed red blood cells     no abnormal reaction noted by patient  . Umbilical hernia   . Metastatic cancer 07/24/2012    INTERIM HISTORY: has TOBACCO ABUSE; EAR PAIN, LEFT; C O P D; NECK PAIN, LEFT; POSTNASAL DRIP SYNDROME; UNSPECIFIED RESPIRATORY ABNORMALITY; Acute respiratory failure; Community acquired pneumonia; Acute  renal failure; Dehydration; History of laryngeal cancer; History of lung cancer; Tracheostomy in place; Elevated brain natriuretic peptide (BNP) level; Pulmonary nodules; Bilateral hydronephrosis; Cardiomyopathy; and Metastatic cancer on his problem list.  He was diagnosed with laryngeal cancer that was resected in 2010. He had laryngeal biopsy and left upper lobectomy on 03/17/2008 and pathology showed;  1. LARYNX, SUPRAGLOTTIC, BIOPSY: - INVASIVE SQUAMOUS CELL CARCINOMA, SEE COMMENT.  2. LYMPH NODE, AP WINDOW, RESECTION: - NO TUMOR IDENTIFIED.  3. LYMPH NODE, LEFT UPPER LOBE, EXCISION: - NO TUMOR IDENTIFIED.  4. LEFT LUNG, UPPER LOBE, LOBECTOMY: - INVASIVE SQUAMOUS CELL CARCINOMA, 4.5 CM. - NEGATIVE SURGICAL MARGINS OF RESECTION.  Subsequently laryngectomy on 05/06/2008 showed;  1. LYMPH NODE, PRETRACHEAL, EXCISIONAL BIOPSY: ONE LYMPH NODE, NEGATIVE FOR METASTATIC CARCINOMA (0/1)   2. LARYNX, LARYNGECTOMY: - INVASIVE MODERATELY DIFFERENTIATED SQUAMOUS CELL CARCINOMA, INVADING INTO CARTILAGE AND INVOLVING MUSCULAR TISSUE. - ALL RESECTIONS MARGINS ARE NEGATIVE - SEE ONCOLOGY FOR DETAILS;  Patient subsequently developed multiple pulmonary nodules and palliative intent chemotherapy with carboplatin and Taxol was considered.  This was started on 07/31/2012.   ALLERGIES:  has No Known Allergies.  MEDICATIONS: has a current medication list which includes the following prescription(s): aspirin, bisacodyl, carboplatin, carvedilol, dexamethasone, hydrocodone-acetaminophen, lidocaine-prilocaine, lisinopril, lorazepam, lorazepam, naproxen sodium, ondansetron, paclitaxel, tamsulosin, and pegfilgrastim.  SURGICAL HISTORY:  Past Surgical History  Procedure Laterality Date  . Tracheostomy  2010  . Lung removal, partial  2010    left side  . Layngectomy for laryngeal cancer in2010; left upper lobectomy for lung cancer, 2010.    [name] Kitchen  Back surgery      thinks it was done in 2012  .  Esophagogastroduodenoscopy    . Colonoscopy    . Portacath placement Bilateral 07/12/2012    Procedure: INSERTION PORT-A-CATH;  Surgeon: Kerin Perna, MD;  Location[facility];  Service: Thoracic;  Laterality: Bilateral;    FAMILY HISTORY: family history includes Diabetes in his father.  SOCIAL HISTORY:  reports that he has been smoking Cigarettes.  He has been smoking about 1.00 pack per day. He has never used smokeless tobacco. He reports that he does not drink alcohol or use illicit drugs.  REVIEW OF SYSTEMS:  Other than that discussed above is noncontributory.  PHYSICAL EXAMINATION: ECOG PERFORMANCE STATUS: 1 - Symptomatic but completely ambulatory  There were no vitals taken for this visit.  GENERAL:alert, no distress and comfortable SKIN: skin color, texture, turgor are normal, no rashes or significant lesions EYES: PERLA; Conjunctiva are pink and non-injected, sclera clear OROPHARYNX:no exudate, no erythema on lips, buccal mucosa, or tongue. NECK: supple, thyroid normal size, non-tender, without nodularity. No masses. Tracheostomy in place with no evidence of hemorrhage or purulence. CHEST: Increased AP diameter. Scattered tracheal sounds audible bilaterally in the lung fields. LYMPH:  no palpable lymphadenopathy in the cervical, axillary or inguinal LUNGS: clear to auscultation and percussion with normal breathing effort HEART: regular rate & rhythm and no murmurs and +1 lower extremity edema. ABDOMEN:abdomen soft, non-tender and normal bowel sounds MUSCULOSKELETAL:no cyanosis of digits and no clubbing. Range of motion normal.  NEURO: alert & oriented x 3 with fluent speech, no focal motor/sensory deficits   LABORATORY DATA: Infusion on 10/15/2012  Component Date Value Range Status  . WBC 10/15/2012 1.8* 4.0 - 10.5 K/uL Final  . RBC 10/15/2012 3.32* 4.22 - 5.81 MIL/uL Final  . Hemoglobin 10/15/2012 10.4* 13.0 - 17.0 g/dL Final  . HCT 16/10/9602 31.3* 39.0 - 52.0 % Final  .  MCV 10/15/2012 94.3  78.0 - 100.0 fL Final  . MCH 10/15/2012 31.3  26.0 - 34.0 pg Final  . MCHC 10/15/2012 33.2  30.0 - 36.0 g/dL Final  . RDW 54/09/8117 16.3* 11.5 - 15.5 % Final  . Platelets 10/15/2012 165  150 - 400 K/uL Final  . Neutrophils Relative % 10/15/2012 46  43 - 77 % Final  . Neutro Abs 10/15/2012 0.8* 1.7 - 7.7 K/uL Final  . Lymphocytes Relative 10/15/2012 28  12 - 46 % Final  . Lymphs Abs 10/15/2012 0.5* 0.7 - 4.0 K/uL Final  . Monocytes Relative 10/15/2012 19* 3 - 12 % Final  . Monocytes Absolute 10/15/2012 0.3  0.1 - 1.0 K/uL Final  . Eosinophils Relative 10/15/2012 3  0 - 5 % Final  . Eosinophils Absolute 10/15/2012 0.1  0.0 - 0.7 K/uL Final  . Basophils Relative 10/15/2012 4* 0 - 1 % Final  . Basophils Absolute 10/15/2012 0.1  0.0 - 0.1 K/uL Final  Infusion on 10/08/2012  Component Date Value Range Status  . WBC 10/08/2012 6.5  4.0 - 10.5 K/uL Final  . RBC 10/08/2012 3.26* 4.22 - 5.81 MIL/uL Final  . Hemoglobin 10/08/2012 10.3* 13.0 - 17.0 g/dL Final  . HCT 14/78/2956 30.4* 39.0 - 52.0 % Final  . MCV 10/08/2012 93.3  78.0 - 100.0 fL Final  . MCH 10/08/2012 31.6  26.0 - 34.0 pg Final  . MCHC 10/08/2012 33.9  30.0 - 36.0 g/dL Final  . RDW 21/30/8657 15.9* 11.5 - 15.5 % Final  . Platelets 10/08/2012 224  150 - 400  K/uL Final  . Neutrophils Relative % 10/08/2012 88* 43 - 77 % Final  . Neutro Abs 10/08/2012 5.7  1.7 - 7.7 K/uL Final  . Lymphocytes Relative 10/08/2012 6* 12 - 46 % Final  . Lymphs Abs 10/08/2012 0.4* 0.7 - 4.0 K/uL Final  . Monocytes Relative 10/08/2012 2* 3 - 12 % Final  . Monocytes Absolute 10/08/2012 0.1  0.1 - 1.0 K/uL Final  . Eosinophils Relative 10/08/2012 3  0 - 5 % Final  . Eosinophils Absolute 10/08/2012 0.2  0.0 - 0.7 K/uL Final  . Basophils Relative 10/08/2012 1  0 - 1 % Final  . Basophils Absolute 10/08/2012 0.1  0.0 - 0.1 K/uL Final  Infusion on 10/01/2012  Component Date Value Range Status  . WBC 10/01/2012 3.7* 4.0 - 10.5 K/uL Final   . RBC 10/01/2012 3.44* 4.22 - 5.81 MIL/uL Final  . Hemoglobin 10/01/2012 10.7* 13.0 - 17.0 g/dL Final  . HCT 04/54/0981 32.1* 39.0 - 52.0 % Final  . MCV 10/01/2012 93.3  78.0 - 100.0 fL Final  . MCH 10/01/2012 31.1  26.0 - 34.0 pg Final  . MCHC 10/01/2012 33.3  30.0 - 36.0 g/dL Final  . RDW 19/14/7829 15.7* 11.5 - 15.5 % Final  . Platelets 10/01/2012 262  150 - 400 K/uL Final  . Neutrophils Relative % 10/01/2012 91* 43 - 77 % Final  . Neutro Abs 10/01/2012 3.4  1.7 - 7.7 K/uL Final  . Lymphocytes Relative 10/01/2012 8* 12 - 46 % Final  . Lymphs Abs 10/01/2012 0.3* 0.7 - 4.0 K/uL Final  . Monocytes Relative 10/01/2012 1* 3 - 12 % Final  . Monocytes Absolute 10/01/2012 0.0* 0.1 - 1.0 K/uL Final  . Eosinophils Relative 10/01/2012 0  0 - 5 % Final  . Eosinophils Absolute 10/01/2012 0.0  0.0 - 0.7 K/uL Final  . Basophils Relative 10/01/2012 0  0 - 1 % Final  . Basophils Absolute 10/01/2012 0.0  0.0 - 0.1 K/uL Final  . Sodium 10/01/2012 135  135 - 145 mEq/L Final  . Potassium 10/01/2012 5.3* 3.5 - 5.1 mEq/L Final  . Chloride 10/01/2012 102  96 - 112 mEq/L Final  . CO2 10/01/2012 24  19 - 32 mEq/L Final  . Glucose, Bld 10/01/2012 154* 70 - 99 mg/dL Final  . BUN 56/21/3086 23  6 - 23 mg/dL Final  . Creatinine, Ser 10/01/2012 1.47* 0.50 - 1.35 mg/dL Final  . Calcium 57/84/6962 9.3  8.4 - 10.5 mg/dL Final  . Total Protein 10/01/2012 7.0  6.0 - 8.3 g/dL Final  . Albumin 95/28/4132 3.3* 3.5 - 5.2 g/dL Final  . AST 44/01/270 17  0 - 37 U/L Final  . ALT 10/01/2012 9  0 - 53 U/L Final  . Alkaline Phosphatase 10/01/2012 70  39 - 117 U/L Final  . Total Bilirubin 10/01/2012 0.3  0.3 - 1.2 mg/dL Final  . GFR calc non Af Amer 10/01/2012 45* >90 mL/min Final  . GFR calc Af Amer 10/01/2012 52* >90 mL/min Final   Comment: (NOTE)                          The eGFR has been calculated using the CKD EPI equation.                          This calculation has not been validated in all clinical situations.  eGFR's persistently <90 mL/min signify possible Chronic Kidney                          Disease.    PATHOLOGY:  Urinalysis    Component Value Date/Time   COLORURINE YELLOW 01/24/2012 1535   APPEARANCEUR CLEAR 01/24/2012 1535   LABSPEC 1.015 01/24/2012 1535   PHURINE 5.5 01/24/2012 1535   GLUCOSEU NEGATIVE 01/24/2012 1535   HGBUR NEGATIVE 01/24/2012 1535   BILIRUBINUR NEGATIVE 01/24/2012 1535   KETONESUR NEGATIVE 01/24/2012 1535   PROTEINUR TRACE* 01/24/2012 1535   UROBILINOGEN 0.2 01/24/2012 1535   NITRITE NEGATIVE 01/24/2012 1535   LEUKOCYTESUR NEGATIVE 01/24/2012 1535    RADIOGRAPHIC STUDIES: Ct Chest W Contrast  09/24/2012   *RADIOLOGY REPORT*  Clinical Data: Bilateral pulmonary nodules/metastases status post three cycles of chemotherapy, history of bilateral lung cancer and laryngeal cancer status post surgery/radiation in 2010  CT CHEST WITH CONTRAST  Technique:  Multidetector CT imaging of the chest was performed following the standard protocol during bolus administration of intravenous contrast.  Contrast: 80mL OMNIPAQUE IOHEXOL 300 MG/ML  SOLN  Comparison: 06/19/2012  Findings: 12 x 14 mm spiculated right upper lobe pulmonary nodule, previously 12 x 19 mm.  Mild residual scarring at the site of prior lateral right middle lobe nodule (series 3/image 34).  Mild patchy opacity in the medial right middle lobe (series 3/image 41), likely infectious/inflammatory.  Status post left upper lobectomy.  Prior posterior left lower lobe pulmonary nodule has resolved.  Suspected additional satellite nodule has also resolved.  11 x 14 mm irregular patchy/nodular opacity at the left lung base (series 3/image 38), new, possibly infectious/inflammatory.  Trace left pleural effusion, mildly decreased.  Underlying moderate centrilobular and paraseptal emphysematous changes.  Left apical pleural parenchymal scarring with suspected paramediastinal radiation changes.  No pneumothorax.  Prior tracheotomy.   The heart is top normal in size.  No pericardial effusion. Coronary atherosclerosis.  Atherosclerotic calcifications of the aortic arch.  Right chest port.  Borderline enlarged mediastinal lymphadenopathy, including 10 mm short-axis nodes in the AP window (series 2/image 22), precarinal region (series 2/image 23), and subcarinal region (series 2/image 29).  Suspected fullness of the bilateral renal collecting systems (series 2/image 64), incompletely visualized.  Degenerative changes of the visualized thoracolumbar spine.  IMPRESSION: Status post left upper lobectomy.  14 mm spiculated right upper lobe pulmonary nodule, decreased. Additional right middle lobe and left lower lobe pulmonary nodules/metastases have essentially resolved.  Mild patchy opacity in the medial right middle lobe, likely infectious/inflammatory. 14 mm irregular patchy/nodular opacity at the left lung base, new, possibly infectious/inflammatory.  Borderline enlarged mediastinal lymphadenopathy, as described above.   Original Report Authenticated By: Charline Bills, M.D.    ASSESSMENT: #1. Metastatic squamous cell carcinoma to the lungs, either representing spread of laryngeal cancer or new primary lung cancer. To receive cycle 5 of a planned 6 of carboplatin and Taxol. #2. Chronic obstructive pulmonary disease, stable. #3. Lower extremity edema, currently on diuretics per cardiology.    PLAN: #1. Cycle 5 of carboplatin/Taxol today. #2. Prophylactic Z-Pak after each chemotherapy treatment. #3. Suggested obtaining a humidifier for his bedroom to prevent drying out of his tracheostomy stoma. #4. Followup in 3 weeks to receive cycle 6 of therapy of a planned 6.   All questions were answered. The patient knows to call the clinic with any problems, questions or concerns. We can certainly see the patient much sooner if necessary.   I spent 30  minutes counseling the patient face to face. The total time spent in the appointment was  25 minutes.    Maurilio Lovely, MD 10/22/2012 8:47 AM

## 2012-10-23 ENCOUNTER — Inpatient Hospital Stay (HOSPITAL_COMMUNITY): Payer: Managed Care, Other (non HMO)

## 2012-10-23 ENCOUNTER — Other Ambulatory Visit: Payer: Self-pay | Admitting: *Deleted

## 2012-10-23 MED ORDER — CARVEDILOL 3.125 MG PO TABS: 3.1250 mg | ORAL_TABLET | Freq: Two times a day (BID) | ORAL | Status: DC

## 2012-10-29 ENCOUNTER — Telehealth: Payer: Self-pay | Admitting: Family Medicine

## 2012-10-29 ENCOUNTER — Encounter (HOSPITAL_BASED_OUTPATIENT_CLINIC_OR_DEPARTMENT_OTHER): Payer: Managed Care, Other (non HMO)

## 2012-10-29 DIAGNOSIS — Z79899 Other long term (current) drug therapy: Secondary | ICD-10-CM

## 2012-10-29 DIAGNOSIS — I10 Essential (primary) hypertension: Secondary | ICD-10-CM

## 2012-10-29 DIAGNOSIS — C799 Secondary malignant neoplasm of unspecified site: Secondary | ICD-10-CM

## 2012-10-29 DIAGNOSIS — C801 Malignant (primary) neoplasm, unspecified: Secondary | ICD-10-CM

## 2012-10-29 DIAGNOSIS — Z125 Encounter for screening for malignant neoplasm of prostate: Secondary | ICD-10-CM

## 2012-10-29 LAB — CBC WITH DIFFERENTIAL/PLATELET
Basophils Relative: 1 % (ref 0–1)
Eosinophils Absolute: 0.2 10*3/uL (ref 0.0–0.7)
Eosinophils Relative: 8 % — ABNORMAL HIGH (ref 0–5)
HCT: 29.9 % — ABNORMAL LOW (ref 39.0–52.0)
Lymphocytes Relative: 20 % (ref 12–46)
Lymphs Abs: 0.6 10*3/uL — ABNORMAL LOW (ref 0.7–4.0)
MCH: 31.6 pg (ref 26.0–34.0)
MCHC: 33.4 g/dL (ref 30.0–36.0)
MCV: 94.6 fL (ref 78.0–100.0)
Monocytes Absolute: 0.1 10*3/uL (ref 0.1–1.0)
Neutrophils Relative %: 68 % (ref 43–77)
RDW: 16.1 % — ABNORMAL HIGH (ref 11.5–15.5)
WBC: 2.9 10*3/uL — ABNORMAL LOW (ref 4.0–10.5)

## 2012-10-29 NOTE — Telephone Encounter (Signed)
Lip liv m7 psa 

## 2012-10-29 NOTE — Progress Notes (Signed)
Labs drawn today for cbc/diff 

## 2012-10-29 NOTE — Telephone Encounter (Signed)
Blood work ordered. Notified patient report to [facility] for lab draw.

## 2012-10-29 NOTE — Telephone Encounter (Signed)
bw request for appt on 10/23

## 2012-11-05 ENCOUNTER — Encounter (HOSPITAL_BASED_OUTPATIENT_CLINIC_OR_DEPARTMENT_OTHER): Payer: Managed Care, Other (non HMO)

## 2012-11-05 DIAGNOSIS — C799 Secondary malignant neoplasm of unspecified site: Secondary | ICD-10-CM

## 2012-11-05 DIAGNOSIS — C801 Malignant (primary) neoplasm, unspecified: Secondary | ICD-10-CM

## 2012-11-05 LAB — CBC WITH DIFFERENTIAL/PLATELET
Basophils Relative: 3 % — ABNORMAL HIGH (ref 0–1)
Eosinophils Absolute: 0.1 10*3/uL (ref 0.0–0.7)
HCT: 30.4 % — ABNORMAL LOW (ref 39.0–52.0)
Hemoglobin: 10.2 g/dL — ABNORMAL LOW (ref 13.0–17.0)
MCH: 32.1 pg (ref 26.0–34.0)
MCHC: 33.6 g/dL (ref 30.0–36.0)
MCV: 95.6 fL (ref 78.0–100.0)
Monocytes Absolute: 0.3 10*3/uL (ref 0.1–1.0)
Monocytes Relative: 18 % — ABNORMAL HIGH (ref 3–12)
Neutro Abs: 0.7 10*3/uL — ABNORMAL LOW (ref 1.7–7.7)
RBC: 3.18 MIL/uL — ABNORMAL LOW (ref 4.22–5.81)

## 2012-11-05 LAB — COMPREHENSIVE METABOLIC PANEL
ALT: 9 U/L (ref 0–53)
Albumin: 3.4 g/dL — ABNORMAL LOW (ref 3.5–5.2)
BUN: 23 mg/dL (ref 6–23)
CO2: 26 mEq/L (ref 19–32)
Calcium: 8.8 mg/dL (ref 8.4–10.5)
Creatinine, Ser: 1.49 mg/dL — ABNORMAL HIGH (ref 0.50–1.35)
Potassium: 4.7 mEq/L (ref 3.5–5.1)
Total Protein: 7 g/dL (ref 6.0–8.3)

## 2012-11-05 NOTE — Progress Notes (Signed)
Labs drawn today for cbc/diff,cmp 

## 2012-11-08 ENCOUNTER — Ambulatory Visit: Payer: Self-pay | Admitting: Family Medicine

## 2012-11-10 ENCOUNTER — Encounter: Payer: Self-pay | Admitting: *Deleted

## 2012-11-12 ENCOUNTER — Encounter (HOSPITAL_BASED_OUTPATIENT_CLINIC_OR_DEPARTMENT_OTHER): Payer: Managed Care, Other (non HMO)

## 2012-11-12 ENCOUNTER — Encounter (HOSPITAL_COMMUNITY): Payer: Self-pay

## 2012-11-12 VITALS — BP 132/78 | HR 87 | Temp 97.7°F | Resp 20 | Wt 141.6 lb

## 2012-11-12 DIAGNOSIS — C799 Secondary malignant neoplasm of unspecified site: Secondary | ICD-10-CM

## 2012-11-12 DIAGNOSIS — C7800 Secondary malignant neoplasm of unspecified lung: Secondary | ICD-10-CM

## 2012-11-12 DIAGNOSIS — Z5111 Encounter for antineoplastic chemotherapy: Secondary | ICD-10-CM

## 2012-11-12 DIAGNOSIS — C329 Malignant neoplasm of larynx, unspecified: Secondary | ICD-10-CM

## 2012-11-12 DIAGNOSIS — C801 Malignant (primary) neoplasm, unspecified: Secondary | ICD-10-CM

## 2012-11-12 DIAGNOSIS — R918 Other nonspecific abnormal finding of lung field: Secondary | ICD-10-CM

## 2012-11-12 DIAGNOSIS — Z85118 Personal history of other malignant neoplasm of bronchus and lung: Secondary | ICD-10-CM

## 2012-11-12 DIAGNOSIS — Z87891 Personal history of nicotine dependence: Secondary | ICD-10-CM

## 2012-11-12 DIAGNOSIS — J449 Chronic obstructive pulmonary disease, unspecified: Secondary | ICD-10-CM

## 2012-11-12 LAB — CBC WITH DIFFERENTIAL/PLATELET
Eosinophils Relative: 0 % (ref 0–5)
HCT: 33.4 % — ABNORMAL LOW (ref 39.0–52.0)
Hemoglobin: 11.3 g/dL — ABNORMAL LOW (ref 13.0–17.0)
Lymphocytes Relative: 9 % — ABNORMAL LOW (ref 12–46)
Lymphs Abs: 0.3 10*3/uL — ABNORMAL LOW (ref 0.7–4.0)
MCH: 32.2 pg (ref 26.0–34.0)
MCV: 95.2 fL (ref 78.0–100.0)
Monocytes Absolute: 0 10*3/uL — ABNORMAL LOW (ref 0.1–1.0)
Neutro Abs: 3.2 10*3/uL (ref 1.7–7.7)
RBC: 3.51 MIL/uL — ABNORMAL LOW (ref 4.22–5.81)
RDW: 15.8 % — ABNORMAL HIGH (ref 11.5–15.5)
WBC: 3.6 10*3/uL — ABNORMAL LOW (ref 4.0–10.5)

## 2012-11-12 LAB — LIPID PANEL
Cholesterol: 201 mg/dL — ABNORMAL HIGH (ref 0–200)
Total CHOL/HDL Ratio: 4.2 Ratio
Triglycerides: 70 mg/dL (ref <150)
VLDL: 14 mg/dL (ref 0–40)

## 2012-11-12 LAB — COMPREHENSIVE METABOLIC PANEL
ALT: 9 U/L (ref 0–53)
Albumin: 3.5 g/dL (ref 3.5–5.2)
Alkaline Phosphatase: 61 U/L (ref 39–117)
CO2: 24 mEq/L (ref 19–32)
Calcium: 9.5 mg/dL (ref 8.4–10.5)
Chloride: 103 mEq/L (ref 96–112)
Creatinine, Ser: 1.41 mg/dL — ABNORMAL HIGH (ref 0.50–1.35)
GFR calc Af Amer: 54 mL/min — ABNORMAL LOW (ref >90)
GFR calc non Af Amer: 47 mL/min — ABNORMAL LOW (ref >90)
Glucose, Bld: 194 mg/dL — ABNORMAL HIGH (ref 70–99)
Total Bilirubin: 0.2 mg/dL — ABNORMAL LOW (ref 0.3–1.2)

## 2012-11-12 LAB — HEPATIC FUNCTION PANEL
Bilirubin, Direct: 0.1 mg/dL (ref 0.0–0.3)
Indirect Bilirubin: 0.2 mg/dL (ref 0.0–0.9)
Total Bilirubin: 0.3 mg/dL (ref 0.3–1.2)
Total Protein: 6.2 g/dL (ref 6.0–8.3)

## 2012-11-12 LAB — BASIC METABOLIC PANEL
BUN: 30 mg/dL — ABNORMAL HIGH (ref 6–23)
CO2: 25 mEq/L (ref 19–32)
Chloride: 107 mEq/L (ref 96–112)
Glucose, Bld: 131 mg/dL — ABNORMAL HIGH (ref 70–99)
Potassium: 5.2 mEq/L (ref 3.5–5.3)
Sodium: 136 mEq/L (ref 135–145)

## 2012-11-12 MED ORDER — FAMOTIDINE IN NACL 20-0.9 MG/50ML-% IV SOLN
INTRAVENOUS | Status: AC
  Filled 2012-11-12: qty 50

## 2012-11-12 MED ORDER — FAMOTIDINE IN NACL 20-0.9 MG/50ML-% IV SOLN
20.0000 mg | Freq: Once | INTRAVENOUS | Status: AC
  Administered 2012-11-12: 20 mg via INTRAVENOUS

## 2012-11-12 MED ORDER — SODIUM CHLORIDE 0.9 % IV SOLN: 16.0000 mg | Freq: Once | INTRAVENOUS | Status: DC

## 2012-11-12 MED ORDER — SODIUM CHLORIDE 0.9 % IV SOLN
Freq: Once | INTRAVENOUS | Status: AC
  Administered 2012-11-12: 16 mg via INTRAVENOUS
  Filled 2012-11-12: qty 8

## 2012-11-12 MED ORDER — DIPHENHYDRAMINE HCL 50 MG/ML IJ SOLN
INTRAMUSCULAR | Status: AC
  Filled 2012-11-12: qty 1

## 2012-11-12 MED ORDER — SODIUM CHLORIDE 0.9 % IV SOLN
Freq: Once | INTRAVENOUS | Status: AC
  Administered 2012-11-12: 11:00:00 via INTRAVENOUS

## 2012-11-12 MED ORDER — DIPHENHYDRAMINE HCL 50 MG/ML IJ SOLN
50.0000 mg | Freq: Once | INTRAMUSCULAR | Status: AC
  Administered 2012-11-12: 50 mg via INTRAVENOUS

## 2012-11-12 MED ORDER — DEXAMETHASONE SODIUM PHOSPHATE 10 MG/ML IJ SOLN: 20.0000 mg | Freq: Once | INTRAMUSCULAR | Status: DC

## 2012-11-12 MED ORDER — PACLITAXEL CHEMO INJECTION 300 MG/50ML
175.0000 mg/m2 | Freq: Once | INTRAVENOUS | Status: AC
  Administered 2012-11-12: 300 mg via INTRAVENOUS
  Filled 2012-11-12: qty 50

## 2012-11-12 MED ORDER — CARBOPLATIN CHEMO INJECTION 450 MG/45ML
332.5000 mg | Freq: Once | INTRAVENOUS | Status: AC
  Administered 2012-11-12: 330 mg via INTRAVENOUS
  Filled 2012-11-12: qty 33

## 2012-11-12 MED ORDER — HEPARIN SOD (PORK) LOCK FLUSH 100 UNIT/ML IV SOLN
500.0000 [IU] | Freq: Once | INTRAVENOUS | Status: AC | PRN
  Administered 2012-11-12: 500 [IU]
  Filled 2012-11-12: qty 5

## 2012-11-12 NOTE — Progress Notes (Signed)
[facility]  OFFICE PROGRESS NOTE  [name], MD 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 Chemotherapy    larynx cancer    CURRENT THERAPY: Carboplatin/Taxol cycle #6 today  INTERVAL HISTORY: CORDARRIUS COAD 79 y.o. male returns for continuation of chemotherapy for metastatic larynx cancer with lung involvement, for cycle #6 of carboplatin/Taxol today.  Continues to have minimal right upper and left upper extremity paresthesias but has had no difficulty in buttoning buttons, eating, or writing. Denies any nausea, vomiting, diarrhea, constipation, dysuria, hematuria, fever, night sweats, cough, chest pain, headache, or seizures.  MEDICAL HISTORY: Past Medical History  Diagnosis Date  . Tracheostomy in place   . Cancer 2010    laryngeal  . Lung cancer 2010  . Pneumonia 2014  . Dysrhythmia     takes Coreg and Lisinopril daily  . Shortness of breath     with exertion  . Enlarged prostate     takes flomax  . Urinary urgency   . Gastric ulcer     hx of  . Hx of transfusion of packed red blood cells     no abnormal reaction noted by patient  . Umbilical hernia   . Metastatic cancer 07/24/2012  . Hyperlipidemia   . ED (erectile dysfunction)   . Insomnia   . CHF (congestive heart failure)     INTERIM HISTORY: has TOBACCO ABUSE; EAR PAIN, LEFT; C O P D; NECK PAIN, LEFT; POSTNASAL DRIP SYNDROME; UNSPECIFIED RESPIRATORY ABNORMALITY; Acute respiratory failure; Community acquired pneumonia; Acute renal failure; Dehydration; History of laryngeal cancer; History of lung cancer; Tracheostomy in place; Elevated brain natriuretic peptide (BNP) level; Pulmonary nodules; Bilateral hydronephrosis;  Cardiomyopathy; and Metastatic cancer on his problem list.   1. LARYNX, SUPRAGLOTTIC, BIOPSY: - INVASIVE SQUAMOUS CELL CARCINOMA, SEE COMMENT.  2. LYMPH NODE, AP WINDOW, RESECTION: - NO TUMOR IDENTIFIED.  3. LYMPH NODE, LEFT UPPER LOBE, EXCISION: - NO TUMOR IDENTIFIED.  4. LEFT LUNG, UPPER LOBE, LOBECTOMY: - INVASIVE SQUAMOUS CELL CARCINOMA, 4.5 CM. - NEGATIVE SURGICAL MARGINS OF RESECTION.  Subsequently laryngectomy on 05/06/2008 showed;  1. LYMPH NODE, PRETRACHEAL, EXCISIONAL BIOPSY: ONE LYMPH NODE, NEGATIVE FOR METASTATIC CARCINOMA (0/1)   2. LARYNX, LARYNGECTOMY: - INVASIVE MODERATELY DIFFERENTIATED SQUAMOUS CELL CARCINOMA, INVADING INTO CARTILAGE AND INVOLVING MUSCULAR TISSUE. - ALL RESECTIONS MARGINS ARE NEGATIVE - SEE ONCOLOGY FOR DETAILS;  Patient subsequently developed multiple pulmonary nodules and palliative intent chemotherapy with carboplatin and Taxol was considered.  This was started on 07/31/2012  ALLERGIES:  is allergic to asa.  MEDICATIONS: has a current medication list which includes the following prescription(s): aspirin, bisacodyl, carboplatin, carvedilol, dexamethasone, hydrocodone-acetaminophen, lidocaine-prilocaine, lisinopril, lorazepam, lorazepam, ondansetron, paclitaxel, tamsulosin, azithromycin, and naproxen sodium, and the following Facility-Administered Medications: CARBOplatin (PARAPLATIN) 330 mg in sodium chloride 0.9 % 100 mL chemo infusion, famotidine, heparin lock flush, ondansetron (ZOFRAN) 16 mg, dexamethasone (DECADRON) 20 mg in sodium chloride 0.9 % 50 mL IVPB, and PACLitaxel (TAXOL) 300 mg in dextrose 5 % 250 mL chemo infusion (> 80mg /m2).  SURGICAL HISTORY:  Past Surgical History  Procedure Laterality Date  . Tracheostomy  2010  . Lung removal, partial  2010    left side  . Layngectomy for laryngeal cancer in2010; left  upper lobectomy for lung cancer, 2010.    . Back surgery      thinks it was done in 2012  . Esophagogastroduodenoscopy      . Colonoscopy    . Portacath placement Bilateral 07/12/2012    Procedure: INSERTION PORT-A-CATH;  Surgeon: Kerin Perna, MD;  Location: [facility] OR;  Service: Thoracic;  Laterality: Bilateral;    FAMILY HISTORY: family history includes Diabetes in his father; Heart disease in his father.  SOCIAL HISTORY:  reports that he has been smoking Cigarettes.  He has been smoking about 1.00 pack per day. He has never used smokeless tobacco. He reports that he does not drink alcohol or use illicit drugs.  REVIEW OF SYSTEMS:  Other than that discussed above is noncontributory.  PHYSICAL EXAMINATION: ECOG PERFORMANCE STATUS: 1 - Symptomatic but completely ambulatory  Blood pressure 132/78, pulse 87, temperature 97.7 F (36.5 C), resp. rate 20, weight 141 lb 9.6 oz (64.229 kg).  GENERAL:alert, no distress and comfortable SKIN: skin color, texture, turgor are normal, no rashes or significant lesions EYES: PERLA; Conjunctiva are pink and non-injected, sclera clear OROPHARYNX:no exudate, no erythema on lips, buccal mucosa, or tongue. NECK: supple, thyroid normal size, non-tender, without nodularity. No masses. Tracheostomy stoma with no evidence of bleeding or purulence. CHEST: Increased AP diameter with light port in place. LYMPH:  no palpable lymphadenopathy in the cervical, axillary or inguinal LUNGS: clear to auscultation and percussion with normal breathing effort HEART: regular rate & rhythm and no murmurs and no lower extremity edema ABDOMEN:abdomen soft, non-tender and normal bowel sounds MUSCULOSKELETAL:no cyanosis of digits and no clubbing. Range of motion normal.  NEURO: alert & oriented x 3 with fluent speech, no focal motor/sensory deficits   LABORATORY DATA: Infusion on 11/12/2012  Component Date Value Range Status  . WBC 11/12/2012 3.6* 4.0 - 10.5 K/uL Final  . RBC 11/12/2012 3.51* 4.22 - 5.81 MIL/uL Final  . Hemoglobin 11/12/2012 11.3* 13.0 - 17.0 g/dL Final  . HCT 16/10/9602 33.4*  39.0 - 52.0 % Final  . MCV 11/12/2012 95.2  78.0 - 100.0 fL Final  . MCH 11/12/2012 32.2  26.0 - 34.0 pg Final  . MCHC 11/12/2012 33.8  30.0 - 36.0 g/dL Final  . RDW 54/09/8117 15.8* 11.5 - 15.5 % Final  . Platelets 11/12/2012 171  150 - 400 K/uL Final  . Neutrophils Relative % 11/12/2012 90* 43 - 77 % Final  . Neutro Abs 11/12/2012 3.2  1.7 - 7.7 K/uL Final  . Lymphocytes Relative 11/12/2012 9* 12 - 46 % Final  . Lymphs Abs 11/12/2012 0.3* 0.7 - 4.0 K/uL Final  . Monocytes Relative 11/12/2012 1* 3 - 12 % Final  . Monocytes Absolute 11/12/2012 0.0* 0.1 - 1.0 K/uL Final  . Eosinophils Relative 11/12/2012 0  0 - 5 % Final  . Eosinophils Absolute 11/12/2012 0.0  0.0 - 0.7 K/uL Final  . Basophils Relative 11/12/2012 0  0 - 1 % Final  . Basophils Absolute 11/12/2012 0.0  0.0 - 0.1 K/uL Final  . Sodium 11/12/2012 137  135 - 145 mEq/L Final  . Potassium 11/12/2012 4.6  3.5 - 5.1 mEq/L Final  . Chloride 11/12/2012 103  96 - 112 mEq/L Final  . CO2 11/12/2012 24  19 - 32 mEq/L Final  . Glucose, Bld 11/12/2012 194* 70 - 99 mg/dL Final  . BUN 14/78/2956 31* 6 - 23 mg/dL Final  . Creatinine, Ser 11/12/2012 1.41* 0.50 - 1.35 mg/dL Final  . Calcium 21/30/8657 9.5  8.4 - 10.5 mg/dL Final  . Total Protein 11/12/2012 7.4  6.0 - 8.3 g/dL Final  . Albumin 16/10/9602 3.5  3.5 - 5.2 g/dL Final  . AST 54/09/8117 15  0 - 37 U/L Final  . ALT 11/12/2012 9  0 - 53 U/L Final  . Alkaline Phosphatase 11/12/2012 61  39 - 117 U/L Final  . Total Bilirubin 11/12/2012 0.2* 0.3 - 1.2 mg/dL Final  . GFR calc non Af Amer 11/12/2012 47* >90 mL/min Final  . GFR calc Af Amer 11/12/2012 54* >90 mL/min Final   Comment: (NOTE)                          The eGFR has been calculated using the CKD EPI equation.                          This calculation has not been validated in all clinical situations.                          eGFR's persistently <90 mL/min signify possible Chronic Kidney                          Disease.    Infusion on 11/05/2012  Component Date Value Range Status  . WBC 11/05/2012 1.6* 4.0 - 10.5 K/uL Final  . RBC 11/05/2012 3.18* 4.22 - 5.81 MIL/uL Final  . Hemoglobin 11/05/2012 10.2* 13.0 - 17.0 g/dL Final  . HCT 14/78/2956 30.4* 39.0 - 52.0 % Final  . MCV 11/05/2012 95.6  78.0 - 100.0 fL Final  . MCH 11/05/2012 32.1  26.0 - 34.0 pg Final  . MCHC 11/05/2012 33.6  30.0 - 36.0 g/dL Final  . RDW 21/30/8657 16.1* 11.5 - 15.5 % Final  . Platelets 11/05/2012 166  150 - 400 K/uL Final  . Neutrophils Relative % 11/05/2012 45  43 - 77 % Final  . Neutro Abs 11/05/2012 0.7* 1.7 - 7.7 K/uL Final  . Lymphocytes Relative 11/05/2012 30  12 - 46 % Final  . Lymphs Abs 11/05/2012 0.5* 0.7 - 4.0 K/uL Final  . Monocytes Relative 11/05/2012 18* 3 - 12 % Final  . Monocytes Absolute 11/05/2012 0.3  0.1 - 1.0 K/uL Final  . Eosinophils Relative 11/05/2012 4  0 - 5 % Final  . Eosinophils Absolute 11/05/2012 0.1  0.0 - 0.7 K/uL Final  . Basophils Relative 11/05/2012 3* 0 - 1 % Final  . Basophils Absolute 11/05/2012 0.0  0.0 - 0.1 K/uL Final  . Sodium 11/05/2012 138  135 - 145 mEq/L Final  . Potassium 11/05/2012 4.7  3.5 - 5.1 mEq/L Final  . Chloride 11/05/2012 103  96 - 112 mEq/L Final  . CO2 11/05/2012 26  19 - 32 mEq/L Final  . Glucose, Bld 11/05/2012 101* 70 - 99 mg/dL Final  . BUN 84/69/6295 23  6 - 23 mg/dL Final  . Creatinine, Ser 11/05/2012 1.49* 0.50 - 1.35 mg/dL Final  . Calcium 28/41/3244 8.8  8.4 - 10.5 mg/dL Final  . Total Protein 11/05/2012 7.0  6.0 - 8.3 g/dL Final  . Albumin 01/19/7251 3.4* 3.5 - 5.2 g/dL Final  . AST 66/44/0347 14  0 - 37 U/L Final  . ALT 11/05/2012 9  0 - 53 U/L Final  . Alkaline Phosphatase 11/05/2012 64  39 - 117 U/L Final  . Total Bilirubin  11/05/2012 0.2* 0.3 - 1.2 mg/dL Final  . GFR calc non Af Amer 11/05/2012 44* >90 mL/min Final  . GFR calc Af Amer 11/05/2012 51* >90 mL/min Final   Comment: (NOTE)                          The eGFR has been calculated using the  CKD EPI equation.                          This calculation has not been validated in all clinical situations.                          eGFR's persistently <90 mL/min signify possible Chronic Kidney                          Disease.  Infusion on 10/29/2012  Component Date Value Range Status  . WBC 10/29/2012 2.9* 4.0 - 10.5 K/uL Final  . RBC 10/29/2012 3.16* 4.22 - 5.81 MIL/uL Final  . Hemoglobin 10/29/2012 10.0* 13.0 - 17.0 g/dL Final  . HCT 19/14/7829 29.9* 39.0 - 52.0 % Final  . MCV 10/29/2012 94.6  78.0 - 100.0 fL Final  . MCH 10/29/2012 31.6  26.0 - 34.0 pg Final  . MCHC 10/29/2012 33.4  30.0 - 36.0 g/dL Final  . RDW 56/21/3086 16.1* 11.5 - 15.5 % Final  . Platelets 10/29/2012 153  150 - 400 K/uL Final  . Neutrophils Relative % 10/29/2012 68  43 - 77 % Final  . Neutro Abs 10/29/2012 2.0  1.7 - 7.7 K/uL Final  . Lymphocytes Relative 10/29/2012 20  12 - 46 % Final  . Lymphs Abs 10/29/2012 0.6* 0.7 - 4.0 K/uL Final  . Monocytes Relative 10/29/2012 2* 3 - 12 % Final  . Monocytes Absolute 10/29/2012 0.1  0.1 - 1.0 K/uL Final  . Eosinophils Relative 10/29/2012 8* 0 - 5 % Final  . Eosinophils Absolute 10/29/2012 0.2  0.0 - 0.7 K/uL Final  . Basophils Relative 10/29/2012 1  0 - 1 % Final  . Basophils Absolute 10/29/2012 0.0  0.0 - 0.1 K/uL Final  Infusion on 10/22/2012  Component Date Value Range Status  . WBC 10/22/2012 3.5* 4.0 - 10.5 K/uL Final  . RBC 10/22/2012 3.39* 4.22 - 5.81 MIL/uL Final  . Hemoglobin 10/22/2012 10.9* 13.0 - 17.0 g/dL Final  . HCT 57/84/6962 32.2* 39.0 - 52.0 % Final  . MCV 10/22/2012 95.0  78.0 - 100.0 fL Final  . MCH 10/22/2012 32.2  26.0 - 34.0 pg Final  . MCHC 10/22/2012 33.9  30.0 - 36.0 g/dL Final  . RDW 95/28/4132 16.4* 11.5 - 15.5 % Final  . Platelets 10/22/2012 125* 150 - 400 K/uL Final  . Neutrophils Relative % 10/22/2012 81* 43 - 77 % Final  . Neutro Abs 10/22/2012 2.8  1.7 - 7.7 K/uL Final  . Lymphocytes Relative 10/22/2012 12  12 - 46 % Final   . Lymphs Abs 10/22/2012 0.4* 0.7 - 4.0 K/uL Final  . Monocytes Relative 10/22/2012 4  3 - 12 % Final  . Monocytes Absolute 10/22/2012 0.1  0.1 - 1.0 K/uL Final  . Eosinophils Relative 10/22/2012 2  0 - 5 % Final  . Eosinophils Absolute 10/22/2012 0.1  0.0 - 0.7 K/uL Final  . Basophils Relative 10/22/2012 1  0 - 1 % Final  . Basophils  Absolute 10/22/2012 0.0  0.0 - 0.1 K/uL Final  . Sodium 10/22/2012 137  135 - 145 mEq/L Final  . Potassium 10/22/2012 4.5  3.5 - 5.1 mEq/L Final  . Chloride 10/22/2012 103  96 - 112 mEq/L Final  . CO2 10/22/2012 25  19 - 32 mEq/L Final  . Glucose, Bld 10/22/2012 107* 70 - 99 mg/dL Final  . BUN 09/81/1914 19  6 - 23 mg/dL Final  . Creatinine, Ser 10/22/2012 1.50* 0.50 - 1.35 mg/dL Final  . Calcium 78/29/5621 8.9  8.4 - 10.5 mg/dL Final  . Total Protein 10/22/2012 7.0  6.0 - 8.3 g/dL Final  . Albumin 30/86/5784 3.4* 3.5 - 5.2 g/dL Final  . AST 69/62/9528 16  0 - 37 U/L Final  . ALT 10/22/2012 6  0 - 53 U/L Final  . Alkaline Phosphatase 10/22/2012 59  39 - 117 U/L Final  . Total Bilirubin 10/22/2012 0.3  0.3 - 1.2 mg/dL Final  . GFR calc non Af Amer 10/22/2012 43* >90 mL/min Final  . GFR calc Af Amer 10/22/2012 50* >90 mL/min Final   Comment: (NOTE)                          The eGFR has been calculated using the CKD EPI equation.                          This calculation has not been validated in all clinical situations.                          eGFR's persistently <90 mL/min signify possible Chronic Kidney                          Disease.  Infusion on 10/15/2012  Component Date Value Range Status  . WBC 10/15/2012 1.8* 4.0 - 10.5 K/uL Final  . RBC 10/15/2012 3.32* 4.22 - 5.81 MIL/uL Final  . Hemoglobin 10/15/2012 10.4* 13.0 - 17.0 g/dL Final  . HCT 41/32/4401 31.3* 39.0 - 52.0 % Final  . MCV 10/15/2012 94.3  78.0 - 100.0 fL Final  . MCH 10/15/2012 31.3  26.0 - 34.0 pg Final  . MCHC 10/15/2012 33.2  30.0 - 36.0 g/dL Final  . RDW 02/72/5366 16.3* 11.5  - 15.5 % Final  . Platelets 10/15/2012 165  150 - 400 K/uL Final  . Neutrophils Relative % 10/15/2012 46  43 - 77 % Final  . Neutro Abs 10/15/2012 0.8* 1.7 - 7.7 K/uL Final  . Lymphocytes Relative 10/15/2012 28  12 - 46 % Final  . Lymphs Abs 10/15/2012 0.5* 0.7 - 4.0 K/uL Final  . Monocytes Relative 10/15/2012 19* 3 - 12 % Final  . Monocytes Absolute 10/15/2012 0.3  0.1 - 1.0 K/uL Final  . Eosinophils Relative 10/15/2012 3  0 - 5 % Final  . Eosinophils Absolute 10/15/2012 0.1  0.0 - 0.7 K/uL Final  . Basophils Relative 10/15/2012 4* 0 - 1 % Final  . Basophils Absolute 10/15/2012 0.1  0.0 - 0.1 K/uL Final    PATHOLOGY:  Urinalysis    Component Value Date/Time   COLORURINE YELLOW 01/24/2012 1535   APPEARANCEUR CLEAR 01/24/2012 1535   LABSPEC 1.015 01/24/2012 1535   PHURINE 5.5 01/24/2012 1535   GLUCOSEU NEGATIVE 01/24/2012 1535   HGBUR NEGATIVE 01/24/2012 1535   BILIRUBINUR NEGATIVE 01/24/2012 1535   KETONESUR  NEGATIVE 01/24/2012 1535   PROTEINUR TRACE* 01/24/2012 1535   UROBILINOGEN 0.2 01/24/2012 1535   NITRITE NEGATIVE 01/24/2012 1535   LEUKOCYTESUR NEGATIVE 01/24/2012 1535    RADIOGRAPHIC STUDIES: No results found.  ASSESSMENT:  #1.Metastatic squamous cell carcinoma to the lungs, either representing spread of laryngeal cancer or new primary lung cancer. To receive cycle 6 of a planned 6 of carboplatin and Taxol #2. Obstructive pulmonary disease, no longer smoking. #3. Lower extremity edema, resolved.   PLAN:  #1. Cycle #6 of carboplatin Taxol today. #2. Z-Pak for 4 days. This is in lieu of Neulasta. #3. CT chest abdomen and pelvis in 2 weeks with future treatment dependent upon result.   All questions were answered. The patient knows to call the clinic with any problems, questions or concerns. We can certainly see the patient much sooner if necessary.   I spent 25 minutes counseling the patient face to face. The total time spent in the appointment was 30 minutes.    Maurilio Lovely, MD 11/12/2012 10:41 AM

## 2012-11-13 LAB — PSA: PSA: 3.1 ng/mL (ref <=4.00)

## 2012-11-20 ENCOUNTER — Ambulatory Visit (INDEPENDENT_AMBULATORY_CARE_PROVIDER_SITE_OTHER): Payer: Managed Care, Other (non HMO) | Admitting: Family Medicine

## 2012-11-20 ENCOUNTER — Encounter: Payer: Self-pay | Admitting: Family Medicine

## 2012-11-20 VITALS — BP 130/90 | Ht 67.0 in | Wt 142.4 lb

## 2012-11-20 DIAGNOSIS — N400 Enlarged prostate without lower urinary tract symptoms: Secondary | ICD-10-CM

## 2012-11-20 DIAGNOSIS — J449 Chronic obstructive pulmonary disease, unspecified: Secondary | ICD-10-CM

## 2012-11-20 DIAGNOSIS — C801 Malignant (primary) neoplasm, unspecified: Secondary | ICD-10-CM

## 2012-11-20 DIAGNOSIS — C799 Secondary malignant neoplasm of unspecified site: Secondary | ICD-10-CM

## 2012-11-20 DIAGNOSIS — G4700 Insomnia, unspecified: Secondary | ICD-10-CM

## 2012-11-20 DIAGNOSIS — Z23 Encounter for immunization: Secondary | ICD-10-CM

## 2012-11-20 DIAGNOSIS — R739 Hyperglycemia, unspecified: Secondary | ICD-10-CM

## 2012-11-20 DIAGNOSIS — R7309 Other abnormal glucose: Secondary | ICD-10-CM

## 2012-11-20 LAB — POCT GLYCOSYLATED HEMOGLOBIN (HGB A1C): Hemoglobin A1C: 5.1

## 2012-11-20 MED ORDER — LISINOPRIL 5 MG PO TABS: 5.0000 mg | ORAL_TABLET | Freq: Every day | ORAL | Status: DC

## 2012-11-20 NOTE — Progress Notes (Signed)
Subjective:    Patient ID: Jeffrey [name], male    DOB: [birth year], 79 y.o.   MRN: [id number]  HPI Patient is here today for a check up.   He needs a refill on medications.   Patient uses Flomax for urinating. States it definitely helps him. States she definitely needs refill on the medication.  Patient reports dyspnea with exertion. He has known history of COPD. Also he has developed recurrent lung cancer. Currently in the midst of chemotherapeutic regimen for this.  Patient notes ongoing difficulty sleeping. States Ativan definitely helps him. Needs refill for this.  Was told on his recent blood work that his sugar was quite elevated. No personal history of elevated sugars. Admits to some dietary noncompliance in this area  Pt had his 6th chemo treatment. He gets the treatments every 3 weeks since July 4th.   Results for orders placed in visit on 11/12/12  CBC WITH DIFFERENTIAL      Result Value Range   WBC 3.6 (*) 4.0 - 10.5 K/uL   RBC 3.51 (*) 4.22 - 5.81 MIL/uL   Hemoglobin 11.3 (*) 13.0 - 17.0 g/dL   HCT 82.9 (*) 56.2 - 13.0 %   MCV 95.2  78.0 - 100.0 fL   MCH 32.2  26.0 - 34.0 pg   MCHC 33.8  30.0 - 36.0 g/dL   RDW 86.5 (*) 78.4 - 69.6 %   Platelets 171  150 - 400 K/uL   Neutrophils Relative % 90 (*) 43 - 77 %   Neutro Abs 3.2  1.7 - 7.7 K/uL   Lymphocytes Relative 9 (*) 12 - 46 %   Lymphs Abs 0.3 (*) 0.7 - 4.0 K/uL   Monocytes Relative 1 (*) 3 - 12 %   Monocytes Absolute 0.0 (*) 0.1 - 1.0 K/uL   Eosinophils Relative 0  0 - 5 %   Eosinophils Absolute 0.0  0.0 - 0.7 K/uL   Basophils Relative 0  0 - 1 %   Basophils Absolute 0.0  0.0 - 0.1 K/uL  COMPREHENSIVE METABOLIC PANEL      Result Value Range   Sodium 137  135 - 145 mEq/L   Potassium 4.6  3.5 - 5.1 mEq/L   Chloride 103  96 - 112 mEq/L   CO2 24  19 - 32 mEq/L   Glucose, Bld 194 (*) 70 - 99 mg/dL   BUN 31 (*) 6 - 23 mg/dL   Creatinine, Ser 2.95 (*) 0.50 - 1.35 mg/dL   Calcium 9.5  8.4 - 28.4 mg/dL   Total  Protein 7.4  6.0 - 8.3 g/dL   Albumin 3.5  3.5 - 5.2 g/dL   AST 15  0 - 37 U/L   ALT 9  0 - 53 U/L   Alkaline Phosphatase 61  39 - 117 U/L   Total Bilirubin 0.2 (*) 0.3 - 1.2 mg/dL   GFR calc non Af Amer 47 (*) >90 mL/min   GFR calc Af Amer 54 (*) >90 mL/min    Results for orders placed in visit on 11/20/12  POCT GLYCOSYLATED HEMOGLOBIN (HGB A1C)      Result Value Range   Hemoglobin A1C 5.1      Review of Systems No chest pain no headache somewhat diminished energy no abdominal pain no change in bowel habits no blood in stools ROS otherwise negative    Objective:   Physical Exam Alert no acute distress. HEENT normal. Lungs no wheezes diminished breath sounds heart regular in  rhythm. Ankles without edema.       Assessment & Plan:  Impression #1 hypoglycemia. Discussed. Likely secondary to chemotherapeutic regimen. #2 insomnia ongoing with need for medication discussed. #3 prostate hypertrophy stable on meds. #4 COPD complicated by now management for recurrent lung cancer. Plan encouraged to cut down sugar intake. Medications refilled. Diet exercise discussed in encourage. Continue to followup with specialist recheck in 6 months. Flu shot today [facility]

## 2012-11-21 DIAGNOSIS — G4700 Insomnia, unspecified: Secondary | ICD-10-CM | POA: Insufficient documentation

## 2012-11-21 DIAGNOSIS — N400 Enlarged prostate without lower urinary tract symptoms: Secondary | ICD-10-CM | POA: Insufficient documentation

## 2012-11-23 ENCOUNTER — Other Ambulatory Visit (HOSPITAL_COMMUNITY): Payer: Self-pay | Admitting: Hematology and Oncology

## 2012-11-26 ENCOUNTER — Ambulatory Visit (HOSPITAL_COMMUNITY): Payer: Managed Care, Other (non HMO)

## 2012-11-26 ENCOUNTER — Ambulatory Visit (HOSPITAL_COMMUNITY)
Admission: RE | Admit: 2012-11-26 | Discharge: 2012-11-26 | Disposition: A | Discharge status: Home / Self Care | Payer: Managed Care, Other (non HMO) | Source: Ambulatory Visit | Attending: Hematology and Oncology | Admitting: Hematology and Oncology

## 2012-11-26 DIAGNOSIS — C329 Malignant neoplasm of larynx, unspecified: Secondary | ICD-10-CM | POA: Insufficient documentation

## 2012-11-26 DIAGNOSIS — C7800 Secondary malignant neoplasm of unspecified lung: Secondary | ICD-10-CM | POA: Insufficient documentation

## 2012-11-26 DIAGNOSIS — J90 Pleural effusion, not elsewhere classified: Secondary | ICD-10-CM | POA: Insufficient documentation

## 2012-11-26 DIAGNOSIS — R911 Solitary pulmonary nodule: Secondary | ICD-10-CM | POA: Insufficient documentation

## 2012-11-26 DIAGNOSIS — R599 Enlarged lymph nodes, unspecified: Secondary | ICD-10-CM | POA: Insufficient documentation

## 2012-11-26 MED ORDER — IOHEXOL 300 MG/ML  SOLN
100.0000 mL | Freq: Once | INTRAMUSCULAR | Status: AC | PRN
  Administered 2012-11-26: 100 mL via INTRAVENOUS

## 2012-12-02 NOTE — Progress Notes (Signed)
Jeffrey Asa, MD [address] [place] [address]  Metastatic cancer  CURRENT THERAPY: S/P 6 cycles of Carboplatin/Paclitaxel.  Chemotherapy 07/31/2012 08/20/2012 09/10/2012  Day, Cycle Day 1, Cycle 1 Day 1, Cycle 2 Day 1, Cycle 3  CARBOplatin (PARAPLATIN) IV 330 mg 330 mg 330 mg  PACLitaxel (TAXOL) IV 175 mg/m2 175 mg/m2 175 mg/m2   Chemotherapy 10/01/2012 10/22/2012 11/12/2012  Day, Cycle Day 1, Cycle 4 Day 1, Cycle 5 Day 1, Cycle 6  CARBOplatin (PARAPLATIN) IV 330 mg 330 mg 330 mg  PACLitaxel (TAXOL) IV 175 mg/m2 175 mg/m2 175 mg/m2    INTERVAL HISTORY: Jeffrey [name] 79 y.o. male returns for  regular  visit for followup of recurrent metastatic disease consistent with either cancer of the lung or cancer of the larynx, both resected in 2010 now with multiple pulmonary nodules all consistent with metastatic disease.  S/P 6 cycles of Carboplatin/Paclitaxel from 07/31/2012- 11/12/2012.  CT scan following 6 cycles demonstrates a response to therapy on 11/26/2012.  I personally reviewed and went over radiographic studies with the patient.  His most recent restaging CT scans show a response to therapy.  The full report follows later in the progress note.   Now that he has completed 6 cycles of Carboplatin, NCCN guidelines from metastatic NSCLC, squamous cell type recommends the following: 1. Maintenance Gemcitabine (Category 2B) 2. Maintenance Erlotinib (Category 2B) 3. Maintenance Docetaxel (Category 2 B) 4. Close observation Two studies have shown a benefit in progression-free and overall survival with the initiation of pemetrexed or erlotinib after first-line chemotherapy, in patients without disease progression after 4-6 cycles of therapy.   There are no specific recommendation for cancer of larynxo following 6 cycles of Carboplatin/Pacliatexel.  Docetaxel single-agent is approved for laryngeal cancer, but gemcitabine and erlotinib is not.  After a long discussion, Jeffrey [name]  is agreeable to Tarceva therapy.  I will start him at 150 mg daily and re-evaluate him from a toxicity standpoint about 2 weeks after starting.  I will restage him with CT scans 2 months after being on the medication.   He is agreeable to this.  We discussed the risks, benefits, alternatives, and side effects of Tarceva therapy.   His mother-in-law accompanies the patient to his appointment today.   Oncologically, Jeffrey [name] denies any complaints and ROS questioning is negative.    Past Medical History  Diagnosis Date  . Tracheostomy in place   . Cancer 2010    laryngeal  . Lung cancer 2010  . Pneumonia 2014  . Dysrhythmia     takes Coreg and Lisinopril daily  . Shortness of breath     with exertion  . Enlarged prostate     takes flomax  . Urinary urgency   . Gastric ulcer     hx of  . Hx of transfusion of packed red blood cells     no abnormal reaction noted by patient  . Umbilical hernia   . Metastatic cancer 07/24/2012  . Hyperlipidemia   . ED (erectile dysfunction)   . Insomnia   . CHF (congestive heart failure)     has TOBACCO ABUSE; EAR PAIN, LEFT; C O P D; NECK PAIN, LEFT; POSTNASAL DRIP SYNDROME; UNSPECIFIED RESPIRATORY ABNORMALITY; Acute respiratory failure; Community acquired pneumonia; Acute renal failure; Dehydration; History of laryngeal cancer; History of lung cancer; Tracheostomy in place; Elevated brain natriuretic peptide (BNP) level; Pulmonary nodules; Bilateral hydronephrosis; Cardiomyopathy; Metastatic cancer; Prostate hypertrophy; and Insomnia on his problem list.  is allergic to [name].  Jeffrey [name] does not currently have medications on file.  Past Surgical History  Procedure Laterality Date  . Tracheostomy  2010  . Lung removal, partial  2010    left side  . Layngectomy for laryngeal cancer in2010; left upper lobectomy for lung cancer, 2010.    . Back surgery      thinks it was done in 2012  . Esophagogastroduodenoscopy    . Colonoscopy    .  Portacath placement Bilateral 07/12/2012    Procedure: INSERTION PORT-A-CATH;  Surgeon: Kerin Perna, MD;  Location: [facility] OR;  Service: Thoracic;  Laterality: Bilateral;    Denies any headaches, dizziness, double vision, fevers, chills, night sweats, nausea, vomiting, diarrhea, constipation, chest pain, heart palpitations, shortness of breath, blood in stool, black tarry stool, urinary pain, urinary burning, urinary frequency, hematuria.   PHYSICAL EXAMINATION  ECOG PERFORMANCE STATUS: 1 - Symptomatic but completely ambulatory  Filed Vitals:   12/03/12 1146  BP: 127/79  Pulse: 70  Temp: 97.2 F (36.2 C)  Resp: 16    GENERAL:alert, no distress, comfortable, cooperative, smiling and using dictaphone to communicate. SKIN: skin color, texture, turgor are normal, no rashes or significant lesions HEAD: Normocephalic, No masses, lesions, tenderness or abnormalities EYES: normal, PERRLA, EOMI, Conjunctiva are pink and non-injected EARS: External ears normal OROPHARYNX:mucous membranes are moist  NECK: supple, trachea midline LYMPH:  not examined BREAST:not examined LUNGS: not examined HEART: not examined ABDOMEN:not examined BACK: Back symmetric, no curvature. EXTREMITIES:no skin discoloration  NEURO: alert & oriented x 3 with fluent speech, no focal motor/sensory deficits, gait normal   LABORATORY DATA: CBC    Component Value Date/Time   WBC 4.3 12/03/2012 1129   RBC 3.53* 12/03/2012 1129   HGB 11.4* 12/03/2012 1129   HCT 33.7* 12/03/2012 1129   PLT 190 12/03/2012 1129   MCV 95.5 12/03/2012 1129   MCH 32.3 12/03/2012 1129   MCHC 33.8 12/03/2012 1129   RDW 15.7* 12/03/2012 1129   LYMPHSABS 0.4* 12/03/2012 1129   MONOABS 0.2 12/03/2012 1129   EOSABS 0.1 12/03/2012 1129   BASOSABS 0.0 12/03/2012 1129      Chemistry      Component Value Date/Time   NA 138 12/03/2012 1129   K 4.3 12/03/2012 1129   CL 103 12/03/2012 1129   CO2 26 12/03/2012 1129   BUN 27* 12/03/2012  1129   CREATININE 1.63* 12/03/2012 1129   CREATININE 1.46* 11/12/2012 0839      Component Value Date/Time   CALCIUM 9.1 12/03/2012 1129   CALCIUM 8.5 01/26/2012 1312   ALKPHOS 63 12/03/2012 1129   AST 12 12/03/2012 1129   ALT 8 12/03/2012 1129   BILITOT 0.2* 12/03/2012 1129       RADIOGRAPHIC STUDIES:  11/26/2012  CLINICAL DATA: Restaging of carcinoma of larynx/ lung. 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  EXAM:  CT CHEST, ABDOMEN WITH CONTRAST  TECHNIQUE:  Multidetector CT imaging of the chest, abdomen was performed  following the standard protocol during bolus administration of  intravenous contrast.  CONTRAST: Omni 300  COMPARISON: Chest CT 09/24/2012. PET 02/17/2012. No recent  abdominal CTs. [name] Kitchen  FINDINGS:  CT CHEST FINDINGS  Lungs/Pleura: Prior tracheostomy. Secretions within the dependent  trachea. Surgical changes of left upper lobectomy. Moderate  centrilobular emphysema.  Pleural-based nodularity at the right apex which measures 1.5 x 1.1  cm on image 7. 1.6 x 1.2 cm at the same level on the prior.  Clustered right middle  lobe nodules on image 34/series 3 are similar  .  Decreased left basilar nodularity. Maximally 7 mm on image 37 versus  1.4 x 1.1 cm on the prior. Suspect radiation changes within the left  apex.  Small volume left-sided pleural fluid which is similar.  Heart/Mediastinum: No supraclavicular adenopathy. A right-sided  Port-A-Cath which terminates at the low SVC.  Bovine arch. Advanced aortic atherosclerosis. Mild cardiomegaly with  coronary artery atherosclerosis. Trace pericardial fluid is favored  to be physiologic. No central pulmonary embolism, on this  non-dedicated study. A left paratracheal node which measures 1.1 x  1.7 cm and appears similar 1.0 x 1.9 cm on the prior. Precarinal  node measures 9 mm versus 1.0 cm on the prior. No hilar adenopathy.  .  CT ABDOMEN FINDINGS  Abdomen: Normal liver, spleen, stomach,  pancreas, gallbladder,  biliary tract, adrenal glands.  Bilateral renal cortical thinning. Moderate bilateral  hydroureteronephrosis. Hydroureter is followed to the inferior  portion of the exam, where a incompletely imaged but dilated urinary  bladder is seen.  No retroperitoneal or retrocrural adenopathy. Normal abdominal large  and small bowel loops, without ascites.  Bones/Musculoskeletal: Surgical changes of left-sided ribs.  IMPRESSION:  CT CHEST IMPRESSION  1. Similar size of a right apical pulmonary nodule.  2. Similar to improved left lower lobe nodularity, favored to be  post infectious or inflammatory. Similar nodularity in the right  middle lobe. No evidence of new or progressive disease.  3. Similar borderline/mild mediastinal adenopathy.  4. Resolved airspace disease within the medial right middle lobe.  5. Similar small left pleural effusion.  CT ABDOMEN IMPRESSION  1. No acute process or evidence of metastatic disease in the  abdomen.  2. Moderate bilateral hydroureteronephrosis. Incompletely imaged on  this abdomen only exam. Likely related to bladder distention.  Electronically Signed  By: Jeronimo Greaves M.D.  On: 11/26/2012 16:18    ASSESSMENT:  1. Metastatic cancer, likely lung primary. S/P port-a-cath on 07/13/2012. To begin palliative chemotherapy consisting of carboplatin/paclitaxel in the near future.  2. Cardiomyopathy with a decreased ejection fraction in the 15-20% range area  3. Neulasta-induce bone pain  Patient Active Problem List   Diagnosis Date Noted  . Prostate hypertrophy 11/21/2012  . Insomnia 11/21/2012  . Metastatic cancer 07/24/2012  . Cardiomyopathy 01/27/2012  . History of laryngeal cancer 01/26/2012  . History of lung cancer 01/26/2012  . Tracheostomy in place 01/26/2012  . Elevated brain natriuretic peptide (BNP) level 01/26/2012  . Pulmonary nodules 01/26/2012  . Bilateral hydronephrosis 01/26/2012  . Acute respiratory failure  01/24/2012  . Community acquired pneumonia 01/24/2012  . Acute renal failure 01/24/2012  . Dehydration 01/24/2012  . TOBACCO ABUSE 02/28/2008  . EAR PAIN, LEFT 02/28/2008  . C O P D 02/28/2008  . NECK PAIN, LEFT 02/28/2008  . POSTNASAL DRIP SYNDROME 02/28/2008  . UNSPECIFIED RESPIRATORY ABNORMALITY 02/28/2008     PLAN:  1. I personally reviewed and went over radiographic studies with the patient. 2. Discussion regarding future options including a maintenance chemotherapy versus close observation.  Maintenance regimens include Gemcitabine, Erlotinib, Docetaxel. 3. Chemotherapy teaching 4. Tarceva 150 mg daily Rx given to prior authorization specialist 5. CT Chest without contrast in 10 weeks for restaging.  6. Reviewed NCCN guidelines for metastatic squamous cell carcinoma. 7. Return in 3 weeks for follow-up   THERAPY PLAN:  We reviewed the NCCN guidelines for Metastatic Squamous Cell Carcinoma.  NCCN guidelines recommends: 1. Maintenance Gemcitabine (Category 2B) 2. Maintenance Erlotinib (Category 2B)  3. Maintenance Docetaxel (Category 2 B) 4. Close observation Two studies have shown a benefit in progression-free and overall survival with the initiation of pemetrexed or erlotinib after first-line chemotherapy, in patients without disease progression after 4-6 cycles of therapy.  I will see him back in about 3 weeks to evaluate for toxicities of Tarceva.   All questions were answered. The patient knows to call the clinic with any problems, questions or concerns. We can certainly see the patient much sooner if necessary.  Patient and plan discussed with Dr. Annamarie Dawley and she is in agreement with the aforementioned.   Jeffrey [name]

## 2012-12-03 ENCOUNTER — Encounter (HOSPITAL_BASED_OUTPATIENT_CLINIC_OR_DEPARTMENT_OTHER): Payer: Managed Care, Other (non HMO)

## 2012-12-03 ENCOUNTER — Other Ambulatory Visit: Payer: Self-pay | Admitting: Family Medicine

## 2012-12-03 ENCOUNTER — Ambulatory Visit (INDEPENDENT_AMBULATORY_CARE_PROVIDER_SITE_OTHER): Payer: Managed Care, Other (non HMO) | Admitting: Cardiovascular Disease

## 2012-12-03 ENCOUNTER — Encounter: Payer: Self-pay | Admitting: Cardiovascular Disease

## 2012-12-03 ENCOUNTER — Encounter (HOSPITAL_COMMUNITY): Payer: Managed Care, Other (non HMO) | Attending: Oncology | Admitting: Oncology

## 2012-12-03 VITALS — BP 112/60 | HR 84 | Ht 67.0 in | Wt 140.0 lb

## 2012-12-03 VITALS — BP 127/79 | HR 70 | Temp 97.2°F | Resp 16 | Wt 140.3 lb

## 2012-12-03 DIAGNOSIS — I428 Other cardiomyopathies: Secondary | ICD-10-CM

## 2012-12-03 DIAGNOSIS — R918 Other nonspecific abnormal finding of lung field: Secondary | ICD-10-CM

## 2012-12-03 DIAGNOSIS — Z8521 Personal history of malignant neoplasm of larynx: Secondary | ICD-10-CM

## 2012-12-03 DIAGNOSIS — I429 Cardiomyopathy, unspecified: Secondary | ICD-10-CM

## 2012-12-03 DIAGNOSIS — C329 Malignant neoplasm of larynx, unspecified: Secondary | ICD-10-CM | POA: Insufficient documentation

## 2012-12-03 DIAGNOSIS — C799 Secondary malignant neoplasm of unspecified site: Secondary | ICD-10-CM

## 2012-12-03 DIAGNOSIS — C801 Malignant (primary) neoplasm, unspecified: Secondary | ICD-10-CM

## 2012-12-03 DIAGNOSIS — J449 Chronic obstructive pulmonary disease, unspecified: Secondary | ICD-10-CM

## 2012-12-03 DIAGNOSIS — F17200 Nicotine dependence, unspecified, uncomplicated: Secondary | ICD-10-CM

## 2012-12-03 LAB — CBC WITH DIFFERENTIAL/PLATELET
Basophils Absolute: 0 10*3/uL (ref 0.0–0.1)
Basophils Relative: 1 % (ref 0–1)
Eosinophils Absolute: 0.1 10*3/uL (ref 0.0–0.7)
Eosinophils Relative: 1 % (ref 0–5)
Hemoglobin: 11.4 g/dL — ABNORMAL LOW (ref 13.0–17.0)
Lymphs Abs: 0.4 10*3/uL — ABNORMAL LOW (ref 0.7–4.0)
MCH: 32.3 pg (ref 26.0–34.0)
Monocytes Absolute: 0.2 10*3/uL (ref 0.1–1.0)
Neutro Abs: 3.5 10*3/uL (ref 1.7–7.7)
Neutrophils Relative %: 83 % — ABNORMAL HIGH (ref 43–77)
Platelets: 190 10*3/uL (ref 150–400)
RBC: 3.53 MIL/uL — ABNORMAL LOW (ref 4.22–5.81)
RDW: 15.7 % — ABNORMAL HIGH (ref 11.5–15.5)
WBC: 4.3 10*3/uL (ref 4.0–10.5)

## 2012-12-03 LAB — COMPREHENSIVE METABOLIC PANEL
ALT: 8 U/L (ref 0–53)
AST: 12 U/L (ref 0–37)
BUN: 27 mg/dL — ABNORMAL HIGH (ref 6–23)
CO2: 26 mEq/L (ref 19–32)
Calcium: 9.1 mg/dL (ref 8.4–10.5)
Creatinine, Ser: 1.63 mg/dL — ABNORMAL HIGH (ref 0.50–1.35)
GFR calc Af Amer: 46 mL/min — ABNORMAL LOW (ref >90)
GFR calc non Af Amer: 39 mL/min — ABNORMAL LOW (ref >90)
Glucose, Bld: 132 mg/dL — ABNORMAL HIGH (ref 70–99)
Sodium: 138 mEq/L (ref 135–145)
Total Protein: 7.4 g/dL (ref 6.0–8.3)

## 2012-12-03 LAB — LACTATE DEHYDROGENASE: LDH: 177 U/L (ref 94–250)

## 2012-12-03 MED ORDER — ERLOTINIB HCL 150 MG PO TABS: 150.0000 mg | ORAL_TABLET | Freq: Every day | ORAL | Status: DC

## 2012-12-03 NOTE — Assessment & Plan Note (Signed)
Counseled for less than 10 minutes No motivation to quit

## 2012-12-03 NOTE — Progress Notes (Signed)
Patient ID: Jeffrey [name], male   DOB: 05/07/1933, 79 y.o.   MRN: [id number] 79 y.o. recently hospitalized with dyspnea and urinary retention. Echo done showed EF 15-20% Apparently Dr Dietrich Pates called and he indicated he would see patient as outpatient. Patient has mild chronic dyspnea from previous CA and left lung lobectomy. 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 No previous cardiac issues. No chest pain PND, orthopnea and no LE edema. Seems compensated. Low dose lisinopril started last visit  Reviewed echo from 1/14Study Conclusions  - Left ventricle: The cavity size was mildly dilated. Wall thickness was normal. Systolic function was severely reduced. The estimated ejection fraction was in the range of 15% to 20%. Severe globalhypokinesis to akinesis except the basilar inferolateral segment, which is moderately hypokinetic. - Aortic valve: Moderately calcified annulus. Possibly bicuspid; mildly thickened leaflets. Thickening. - Mitral valve: Mild regurgitation. - Left atrium: The atrium was mildly dilated. - Right ventricle: The cavity size was mildly dilated. Systolic function was moderately reduced. Systolic pressure was moderately increased. - Atrial septum: No defect or patent foramen ovale was identified. - Tricuspid valve: Mild-moderate regurgitation. - Pulmonary arteries: PA peak pressure: 58mm Hg (S). - Pericardium, extracardiac: A trivial pericardial effusion was identified posterior to the heart. There was a moderate-sized right pleural effusion. There was a moderate-sized left pleural effusion.  F/U echo 06/21/12 with continued severe decrease in EF 25% diffuse hypokinesis  CT 04/02/12 reveiwed and rapid progression of mets on both left and right lung To see Dr Mariel Sleet soon. Has had bad experience with Dr Frann Rider and wants a new  Urologist Has had hydronephrosis and foley finally out  Apparently going to start some new oral meds  Still smoking Counseled  for less than 10 minutes and no motivation to quit. "Been doing it for over 60 years and I don't inahale"      ROS: Denies fever, malais, weight loss, blurry vision, decreased visual acuity, cough, sputum, SOB, hemoptysis, pleuritic pain, palpitaitons, heartburn, abdominal pain, melena, lower extremity edema, claudication, or rash.  All other systems reviewed and negative  General: Affect appropriate Chronically ill thin male  HEENT: normal Neck supple with no adenopathy JVP normal no bruits no thyromegaly Lungs  Previous thoracotomy with end expitory wheezing Heart:  S1/S2 no murmur, no rub, gallop or click PMI normal Abdomen: benighn, BS positve, no tenderness, no AAA no bruit.  No HSM or HJR Distal pulses intact with no bruits No edema Neuro non-focal Skin warm and dry No muscular weakness   Current Outpatient Prescriptions  Medication Sig Dispense Refill  . aspirin EC 81 MG EC tablet Take 1 tablet (81 mg total) by mouth daily.  30 tablet  1  . Bisacodyl (LAXATIVE PO) Take by mouth as needed.      . carvedilol (COREG) 3.125 MG tablet TAKE ONE TABLET BY MOUTH TWICE DAILY WITH A MEAL -- **NEEDS OFFICE VISIT**  60 tablet  5  . erlotinib (TARCEVA) 150 MG tablet Take 1 tablet (150 mg total) by mouth daily. Take on an empty stomach 1 hour before meals or 2 hours after.  30 tablet  1  . HYDROcodone-acetaminophen (NORCO/VICODIN) 5-325 MG per tablet Take 0.25-0.5 tablets by mouth 2 (two) times daily as needed. *Must last 30 days*      . lidocaine-prilocaine (EMLA) cream Apply a quarter size amount to port site 1 hour prior to chemo. Do not rub in. Cover with plastic.  30 g  3  . lisinopril (PRINIVIL,ZESTRIL)  5 MG tablet Take 1 tablet (5 mg total) by mouth daily.  30 tablet  5  . LORazepam (ATIVAN) 1 MG tablet TAKE ONE TABLET BY MOUTH AT BEDTIME AS NEEDED FOR SLEEP ** MUST LAST 30 DAYS **  30 tablet  2  . Naproxen Sodium (ALEVE PO) Take 200 mg by mouth as needed.      . ondansetron  (ZOFRAN) 8 MG tablet Starting the day after chemo, take 1 tablet in the am and 1 tablet in the pm for 2 days. Then may take 1 tablet two times a day IF needed for nausea/vomiting.  30 tablet  1  . tamsulosin (FLOMAX) 0.4 MG CAPS Take 0.4 mg by mouth daily.        No current facility-administered medications for this visit.    Allergies  Asa  Electrocardiogram:  01/25/12 ST rate 103 otherwise normal  Assessment and Plan

## 2012-12-03 NOTE — Progress Notes (Signed)
Labs drawn today for cbc/diff,cmp,ldh 

## 2012-12-03 NOTE — Patient Instructions (Addendum)
[facility]   CHEMOTHERAPY INSTRUCTIONS   Jeffrey [name] is a chemotherapy drug. It works by targeting specific protein within cancer cells to stop them from growing. Common side-effects can be rash and diarrhea, fatigue, shortness of breath and abnormal liver tests. Diarrhea can be managed with imodium. If you develop shortness of breath, cough, or fever let us know right away.  POTENTIAL SIDE EFFECTS OF TREATMENT: Increased Susceptibility to Infection, Bone Marrow Suppression and Diarrhea   EDUCATIONAL MATERIALS GIVEN AND REVIEWED: Specific Instructions Sheets: Tarceva   SELF CARE ACTIVITIES WHILE ON CHEMOTHERAPY: Increase your fluid intake to 64oz daily. No alcohol intake. No aspirin or other medications unless approved by your oncologist., Eat foods that are light and easy to digest., Eat foods at cold or room temperature., No fried, fatty, or spicy foods immediately before or after treatment., Have teeth cleaned professionally before starting treatment. Keep dentures and partial plates clean., Use soft toothbrush and do not use mouthwashes that contain alcohol. Biotene is a good mouthwash. Use warm salt water gargles (1 teaspoon salt per 1 quart warm water) before and after meals and at bedtime. Or you may rinse with 2 tablespoons of three -percent hydrogen peroxide mixed in eight ounces of water., Always use sunscreen with SPF (Sun Protection Factor) of 30 or higher., Use your nausea medication as directed to prevent nausea., Use your stool softener or laxative as directed to prevent constipation. and Use your anti-diarrheal medication as directed to stop diarrhea.  Please wash your hands for at least 30 seconds using warm soapy water. Handwashing is the #1 way to prevent the spread of germs. Stay away from sick people or people who are getting over a cold. If you develop respiratory systems such as green/yellow mucus production or productive cough or persistent  cough let us know and we will see if you need an antibiotic. It is a good idea to keep a pair of gloves on when going into grocery stores/[employer] to decrease your risk of coming into contact with germs on the carts, etc. Carry alcohol hand gel with you at all times and use it frequently if out in public. All foods need to be cooked thoroughly. No raw foods. No medium or undercooked meats, eggs. If your food is cooked medium well, it does not need to be hot pink or saturated with bloody liquid at all. Vegetables and fruits need to be washed/rinsed under the faucet with a dish detergent before being consumed. You can eat raw fruits and vegetables unless we tell you otherwise but it would be best if you cooked them or bought frozen. Do not eat off of salad bars or hot bars unless you really trust the cleanliness of the restaurant. If you need dental work, please let us know before you go for your appointment so that we can coordinate the best possible time for you in regards to your chemo regimen. You need to also let your dentist know that you are actively taking chemo. We may need to do labs prior to your dental appointment. We also want your bowels moving at least every other day. If this is not happening, we need to know so that we can get you on a bowel regimen to help you go.     MEDICATIONS: You have been given prescriptions for the following medications:  Tarceva 150mg  tablet. Take 1 tablet on an EMPTY stomach 1 hour before or 2 hours after a meal.     *THIS  IS VERY IMPORTANT!!!!*  Over-the-Counter Meds:   Colace - this is a stool softener. Take 100mg  capsule 2-6 times a day as needed. If you have to take more than 6 capsules of Colace a day call the Cancer Center.  Senna - this is a mild laxative used to treat mild constipation. May take up to 3 tabs @ bedtime if needed for constipation.   Milk of Magnesia - this is a laxative used to treat moderate to severe constipation. May take 2-4  tablespoons every 8 hours as needed. May increase to 8 tablespoons x 1 dose and if no bowel movement call the Cancer Center.  Imodium - this is for diarrhea. Take 2 tabs after 1st loose stool and then 1 tab after each loose stool until you go a total of 12 hours without a loose stool. Call Cancer Center if loose stools continue. If you continue with diarrhea/loose stools, we can prescribe a prescription strength anti-diarrheal.    SYMPTOMS TO REPORT AS SOON AS POSSIBLE AFTER TREATMENT:  FEVER GREATER THAN 100.5 F  CHILLS WITH OR WITHOUT FEVER  NAUSEA AND VOMITING THAT IS NOT CONTROLLED WITH YOUR NAUSEA MEDICATION  UNUSUAL SHORTNESS OF BREATH  UNUSUAL BRUISING OR BLEEDING  TENDERNESS IN MOUTH AND THROAT WITH OR WITHOUT PRESENCE OF ULCERS  URINARY PROBLEMS  BOWEL PROBLEMS  UNUSUAL RASH    Wear comfortable clothing and clothing appropriate for easy access to any Portacath or PICC line. Let us know if there is anything that we can do to make your therapy better!      I have been informed and understand all of the instructions given to me and have received a copy. I have been instructed to call the clinic [phone] or my family physician as soon as possible for continued medical care, if indicated. I do not have any more questions at this time but understand that I may call the Cancer Center or the Patient Navigator at [phone] during office hours should I have questions or need assistance in obtaining follow-up care.      _________________________________________      _______________     __________ Signature of Patient or Authorized Representative        Date                            Time      _________________________________________ Nurse's Signature      Erlotinib tablets What is this medicine? ERLOTINIB (er LOE ti nib) is a chemotherapy drug. It targets a specific protein within cancer cells and stops the cancer cells from growing. This medicine is  used to treat cancers including non-small cell lung cancer and pancreatic cancer. This medicine may be used for other purposes; ask your health care provider or pharmacist if you have questions. COMMON BRAND NAME(S): Tarceva What should I tell my health care provider before I take this medicine? They need to know if you have any of these conditions: -liver disease -lung fibrosis -previous or ongoing radiation therapy -an unusual or allergic reaction to erlotinib, other medicines, foods, dyes, or preservatives -pregnant or trying to get pregnant -breast-feeding How should I use this medicine? Take erlotinib tablets by mouth. Follow the directions on the prescription label. Take this medicine on an empty stomach, at least 1 hour before or 2 hours after food. Do not take with food. Taking this drug with food may increase your chance of developing side effects. Do  not take your medicine more often than directed. Talk to your pediatrician regarding the use of this medicine in children. Special care may be needed. Overdosage: If you think you have taken too much of this medicine contact a poison control center or emergency room at once. NOTE: This medicine is only for you. Do not share this medicine with others. What if I miss a dose? Take your missed dose as soon as you remember. If it is almost time for your next dose, take only that one, and skip your missed dose. Do not take extra or double doses. What may interact with this medicine? -amiodarone -carbamazepine -ciprofloxacin -antiviral medicines for HIV or AIDS -bosentan -certain medicines for blood pressure -certain medicines for cholesterol like atorvastatin, lovastatin, and simvastatin -clarithromycin -erythromycin -grapefruit juice -medicines for depression -medicines for fungal infections like fluconazole, itraconazole, ketoconazole, or voriconazole -non-steroidal anti-inflammatory drugs (NSAIDs such as ibuprofen or  naproxen) -omeprazole -phenobarbital -phenytoin -rifabutin -rifampin -rifapentine -St. John's wort -warfarin This list may not describe all possible interactions. Give your health care provider a list of all the medicines, herbs, non-prescription drugs, or dietary supplements you use. Also tell them if you smoke, drink alcohol, or use illegal drugs. Some items may interact with your medicine. What should I watch for while using this medicine? Visit your doctor for regular check ups. Report any side effects. Continue your course of treatment unless your doctor tells you to stop. You will need blood work done while you are taking this medicine. If you experience any of the following, contact your health care provider: eye irritation; severe or continuing diarrhea, nausea, decreased appetite, or vomiting; or if your breathing gets worse or you develop shortness of breath or cough. If you smoke cigarettes, you should stop smoking. The effectiveness of this drug is reduced by cigarette smoking. If you stop smoking during treatment, be sure to inform your doctor of this change. Do not become pregnant while taking this medicine. Women should inform their doctor if they wish to become pregnant or think they might be pregnant. There is a potential for serious side effects to an unborn child. Talk to your health care professional or pharmacist for more information. Do not breast-feed an infant while taking this medicine. What side effects may I notice from receiving this medicine? Side effects that you should report to your doctor or health care professional as soon as possible: -allergic reactions like skin rash, itching or hives, swelling of the face, lips, or tongue -bloody or black tarry stools -eye irritation -eye pain -fever -mouth sores -problems related to breathing including shortness of breath or cough -severe or persistent diarrhea, nausea, vomiting, or loss of appetite -spitting up blood  or brown material that looks like coffee grounds -unusual bleeding or bruising Side effects that usually do not require medical attention (report to your doctor or health care professional if they continue or are bothersome): -acne -diarrhea -dry skin -itching -loss of appetite -nausea -weak or tired -weight loss  Where should I keep my medicine? Keep out of the reach of children. Store at room temperature between 15 and 30 degrees C (59 and 86 degrees F). Throw away any unused medicine after the expiration date.

## 2012-12-03 NOTE — Patient Instructions (Signed)
[facility] Discharge Instructions  RECOMMENDATIONS MADE BY THE CONSULTANT AND ANY TEST RESULTS WILL BE SENT TO YOUR REFERRING PHYSICIAN.  EXAM FINDINGS BY THE PHYSICIAN TODAY AND SIGNS OR SYMPTOMS TO REPORT TO CLINIC OR PRIMARY PHYSICIAN: Exam and findings as discussed by T. Jacalyn Lefevre, PA-C.  MEDICATIONS PRESCRIBED:  1.  Tarceva  INSTRUCTIONS/FOLLOW-UP: 1.  Begin Tarceva.  Rolly Salter will contact you for chemotherapy teaching before you start the drug.   2.  You will have re-staging CT scans again in 10 weeks. 3.  Please keep your follow-up appointment with T. Kefalas, PA-C in 3 weeks.  Thank you for choosing [facility] to provide your oncology and hematology care.  To afford each patient quality time with our providers, please arrive at least 15 minutes before your scheduled appointment time.  With your help, our goal is to use those 15 minutes to complete the necessary work-up to ensure our physicians have the information they need to help with your evaluation and healthcare recommendations.    Effective January 1st, 2014, we ask that you re-schedule your appointment with our physicians should you arrive 10 or more minutes late for your appointment.  We strive to give you quality time with our providers, and arriving late affects you and other patients whose appointments are after yours.    Again, thank you for choosing [facility].  Our hope is that these requests will decrease the amount of time that you wait before being seen by our physicians.       _____________________________________________________________  Should you have questions after your visit to [facility], please contact our office at [phone] between the hours of 8:30 a.m. and 5:00 p.m.  Voicemails left after 4:30 p.m. will not be returned until the following business day.  For prescription refill requests, have your pharmacy contact our office with your  prescription refill request.

## 2012-12-03 NOTE — Assessment & Plan Note (Signed)
PFT;s per primary PRN inhaler Mild expitory wheezing today Does not seem to equate his smoking with lung issues

## 2012-12-03 NOTE — Patient Instructions (Signed)
Your physician recommends that you schedule a follow-up appointment in: 6 MONTHS 

## 2012-12-03 NOTE — Assessment & Plan Note (Signed)
F/U [facility] oncology To start new oral medicine soon F/U CT per primary

## 2012-12-03 NOTE — Assessment & Plan Note (Signed)
Functional class one.  No volume overload Continue beta blocker and ACE

## 2012-12-03 NOTE — Assessment & Plan Note (Signed)
Stable with good communication using laryngeal mic

## 2012-12-07 ENCOUNTER — Encounter (HOSPITAL_COMMUNITY): Payer: Managed Care, Other (non HMO)

## 2012-12-07 DIAGNOSIS — C799 Secondary malignant neoplasm of unspecified site: Secondary | ICD-10-CM

## 2012-12-07 NOTE — Progress Notes (Signed)
Chemo teaching done over the phone with patient's wife Bonita Quin regarding Tarceva. Teaching packet will be given to patient when he comes in for next visit. Consent will be signed at that point in time also.

## 2012-12-20 ENCOUNTER — Other Ambulatory Visit: Payer: Self-pay | Admitting: Nurse Practitioner

## 2012-12-20 NOTE — Telephone Encounter (Signed)
Last office visit 01/31/12

## 2012-12-20 NOTE — Telephone Encounter (Signed)
Ok plus 5 ref 

## 2012-12-21 ENCOUNTER — Encounter (HOSPITAL_COMMUNITY): Payer: Managed Care, Other (non HMO) | Attending: Oncology

## 2012-12-21 DIAGNOSIS — C801 Malignant (primary) neoplasm, unspecified: Secondary | ICD-10-CM | POA: Insufficient documentation

## 2012-12-21 DIAGNOSIS — C799 Secondary malignant neoplasm of unspecified site: Secondary | ICD-10-CM

## 2012-12-21 DIAGNOSIS — C3490 Malignant neoplasm of unspecified part of unspecified bronchus or lung: Secondary | ICD-10-CM | POA: Insufficient documentation

## 2012-12-21 DIAGNOSIS — R918 Other nonspecific abnormal finding of lung field: Secondary | ICD-10-CM | POA: Insufficient documentation

## 2012-12-21 LAB — CBC WITH DIFFERENTIAL/PLATELET
Basophils Relative: 1 % (ref 0–1)
Eosinophils Absolute: 0.3 10*3/uL (ref 0.0–0.7)
Hemoglobin: 11.8 g/dL — ABNORMAL LOW (ref 13.0–17.0)
Lymphocytes Relative: 15 % (ref 12–46)
Lymphs Abs: 0.6 10*3/uL — ABNORMAL LOW (ref 0.7–4.0)
MCH: 31.3 pg (ref 26.0–34.0)
MCV: 96.3 fL (ref 78.0–100.0)
Monocytes Absolute: 0.4 10*3/uL (ref 0.1–1.0)
Neutro Abs: 2.7 10*3/uL (ref 1.7–7.7)
Neutrophils Relative %: 67 % (ref 43–77)
Platelets: 200 10*3/uL (ref 150–400)
RBC: 3.77 MIL/uL — ABNORMAL LOW (ref 4.22–5.81)
WBC: 4.1 10*3/uL (ref 4.0–10.5)

## 2012-12-21 LAB — COMPREHENSIVE METABOLIC PANEL
ALT: 12 U/L (ref 0–53)
Albumin: 3.6 g/dL (ref 3.5–5.2)
Alkaline Phosphatase: 63 U/L (ref 39–117)
Chloride: 102 mEq/L (ref 96–112)
Potassium: 4.6 mEq/L (ref 3.5–5.1)
Sodium: 137 mEq/L (ref 135–145)
Total Bilirubin: 0.3 mg/dL (ref 0.3–1.2)
Total Protein: 7.3 g/dL (ref 6.0–8.3)

## 2012-12-21 NOTE — Progress Notes (Signed)
Labs drawn today for cbc/diff,cmp 

## 2012-12-21 NOTE — Progress Notes (Addendum)
Chemo consent signed for Tarceva

## 2012-12-24 ENCOUNTER — Encounter (HOSPITAL_COMMUNITY): Payer: Self-pay

## 2012-12-24 ENCOUNTER — Encounter (HOSPITAL_BASED_OUTPATIENT_CLINIC_OR_DEPARTMENT_OTHER): Payer: Managed Care, Other (non HMO)

## 2012-12-24 DIAGNOSIS — R918 Other nonspecific abnormal finding of lung field: Secondary | ICD-10-CM

## 2012-12-24 DIAGNOSIS — F17200 Nicotine dependence, unspecified, uncomplicated: Secondary | ICD-10-CM

## 2012-12-24 DIAGNOSIS — C7800 Secondary malignant neoplasm of unspecified lung: Secondary | ICD-10-CM

## 2012-12-24 DIAGNOSIS — C801 Malignant (primary) neoplasm, unspecified: Secondary | ICD-10-CM

## 2012-12-24 NOTE — Patient Instructions (Signed)
.  [facility] Discharge Instructions  RECOMMENDATIONS MADE BY THE CONSULTANT AND ANY TEST RESULTS WILL BE SENT TO YOUR REFERRING PHYSICIAN.  EXAM FINDINGS BY THE PHYSICIAN TODAY AND SIGNS OR SYMPTOMS TO REPORT TO CLINIC OR PRIMARY PHYSICIAN: Exam and findings as discussed by Dr. Zigmund Daniel. Continue tarceva INSTRUCTIONS/FOLLOW-UP: 4 weeks with labs  Thank you for choosing [facility] to provide your oncology and hematology care.  To afford each patient quality time with our providers, please arrive at least 15 minutes before your scheduled appointment time.  With your help, our goal is to use those 15 minutes to complete the necessary work-up to ensure our physicians have the information they need to help with your evaluation and healthcare recommendations.    Effective January 1st, 2014, we ask that you re-schedule your appointment with our physicians should you arrive 10 or more minutes late for your appointment.  We strive to give you quality time with our providers, and arriving late affects you and other patients whose appointments are after yours.    Again, thank you for choosing [facility].  Our hope is that these requests will decrease the amount of time that you wait before being seen by our physicians.       _____________________________________________________________  Should you have questions after your visit to [facility], please contact our office at [phone] between the hours of 8:30 a.m. and 5:00 p.m.  Voicemails left after 4:30 p.m. will not be returned until the following business day.  For prescription refill requests, have your pharmacy contact our office with your prescription refill request.

## 2012-12-24 NOTE — Progress Notes (Signed)
[facility]  OFFICE PROGRESS NOTE  [name], MD [address] [place] [address]  DIAGNOSIS: Lung cancer, unspecified laterality - Plan: CBC with Differential, Comprehensive metabolic panel, Lactate dehydrogenase  Pulmonary nodules - Plan: CBC with Differential, Comprehensive metabolic panel, Lactate dehydrogenase  Chief Complaint  Patient presents with  . Lung Cancer    CURRENT THERAPY: Tarceva 150 mg daily preceded by 6 cycles of carboplatin/Taxol: Chemotherapy  07/31/2012  08/20/2012  09/10/2012   Day, Cycle  Day 1, Cycle 1  Day 1, Cycle 2  Day 1, Cycle 3   CARBOplatin (PARAPLATIN) IV  330 mg  330 mg  330 mg   PACLitaxel (TAXOL) IV  175 mg/m2  175 mg/m2  175 mg/m2    Chemotherapy  10/01/2012  10/22/2012  11/12/2012   Day, Cycle  Day 1, Cycle 4  Day 1, Cycle 5  Day 1, Cycle 6   CARBOplatin (PARAPLATIN) IV  330 mg  330 mg  330 mg   PACLitaxel (TAXOL) IV  175 mg/m2  175 mg/m2  175 mg/m2     INTERVAL HISTORY: Jeffrey [name] 80 y.o. 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 Guinevere Scarlet on 12/03/2012.   MEDICAL HISTORY: Past Medical History  Diagnosis Date  . Tracheostomy in place   . Cancer 2010    laryngeal  . Lung cancer 2010  . Pneumonia 2014  . Dysrhythmia     takes Coreg and Lisinopril daily  . Shortness of breath     with exertion  . Enlarged prostate     takes flomax  . Urinary urgency   . Gastric ulcer     hx of  . Hx of transfusion of packed red blood cells     no abnormal reaction noted by patient  . Umbilical hernia   . Metastatic cancer 07/24/2012  . Hyperlipidemia   . ED (erectile dysfunction)   . Insomnia   . CHF (congestive heart failure)     INTERIM HISTORY: has TOBACCO ABUSE; EAR PAIN,  LEFT; C O P D; NECK PAIN, LEFT; POSTNASAL DRIP SYNDROME; UNSPECIFIED RESPIRATORY ABNORMALITY; History of laryngeal cancer; History of lung cancer; Tracheostomy in place; Elevated brain natriuretic peptide (BNP) level; Pulmonary nodules; Bilateral hydronephrosis; Cardiomyopathy; Metastatic cancer; Prostate hypertrophy; and Insomnia on his problem list.   recurrent metastatic disease consistent with either cancer of the lung or cancer of the larynx, both resected in 2010 now with multiple pulmonary nodules all consistent with metastatic disease. S/P 6 cycles of Carboplatin/Paclitaxel from 07/31/2012- 11/12/2012. CT scan following 6 cycles demonstrates a response to therapy on 11/26/2012. Started on Tarceva by Dr. Lajuana Ripple 3 weeks ago. He started drug 2 weeks ago and denies any sore mouth, nausea, vomiting, diarrhea, or skin rash. He denies worsening cough but still has expectoration of clear material through his tracheostomy stoma. Appetite is good with no dysphagia, lower extremity swelling or redness, PND, orthopnea, palpitations, headache, or seizures.  ALLERGIES:  is allergic to asa.  MEDICATIONS: has a current medication list which includes the following prescription(s): aspirin, bisacodyl, carvedilol, erlotinib, hydrocodone-acetaminophen, lidocaine-prilocaine, lisinopril, lorazepam, naproxen sodium, ondansetron, and tamsulosin.  SURGICAL HISTORY:  Past Surgical History  Procedure Laterality Date  . Tracheostomy  2010  . Lung  removal, partial  2010    left side  . Layngectomy for laryngeal cancer in2010; left upper lobectomy for lung cancer, 2010.    . Back surgery      thinks it was done in 2012  . Esophagogastroduodenoscopy    . Colonoscopy    . Portacath placement Bilateral 07/12/2012    Procedure: INSERTION PORT-A-CATH;  Surgeon: Kerin Perna, MD;  Location: [facility] OR;  Service: Thoracic;  Laterality: Bilateral;    FAMILY HISTORY: family history includes Diabetes in his father; Heart  disease in his father.  SOCIAL HISTORY:  reports that he has been smoking Cigarettes.  He has been smoking about 1.00 pack per day. He has never used smokeless tobacco. He reports that he does not drink alcohol or use illicit drugs.  REVIEW OF SYSTEMS:  Other than that discussed above is noncontributory.  PHYSICAL EXAMINATION: ECOG PERFORMANCE STATUS: 1 - Symptomatic but completely ambulatory  Blood pressure 133/82, pulse 87, temperature 97.5 F (36.4 C), temperature source Oral, resp. rate 18, weight 139 lb 1.6 oz (63.095 kg).  GENERAL:alert, no distress and comfortable SKIN: skin color, texture, turgor are normal, no rashes or significant lesions EYES: PERLA; Conjunctiva are pink and non-injected, sclera clear OROPHARYNX:no exudate, no erythema on lips, buccal mucosa, or tongue. NECK: supple, thyroid normal size, non-tender, without nodularity. No masses. Midline tracheostomy is without evidence of hemorrhage or purulence.  CHEST: Increased AP diameter. LYMPH:  no palpable lymphadenopathy in the cervical, axillary or inguinal LUNGS: clear to auscultation and percussion with normal breathing effort HEART: regular rate & rhythm and no murmurs. ABDOMEN:abdomen soft, non-tender and normal bowel sounds MUSCULOSKELETAL:no cyanosis of digits and no clubbing. Range of motion normal.  NEURO: alert & oriented x 3 with fluent speech, no focal motor/sensory deficits.   LABORATORY DATA: Infusion on 12/21/2012  Component Date Value Range Status  . WBC 12/21/2012 4.1  4.0 - 10.5 K/uL Final  . RBC 12/21/2012 3.77* 4.22 - 5.81 MIL/uL Final  . Hemoglobin 12/21/2012 11.8* 13.0 - 17.0 g/dL Final  . HCT 16/10/9602 36.3* 39.0 - 52.0 % Final  . MCV 12/21/2012 96.3  78.0 - 100.0 fL Final  . MCH 12/21/2012 31.3  26.0 - 34.0 pg Final  . MCHC 12/21/2012 32.5  30.0 - 36.0 g/dL Final  . RDW 54/09/8117 15.4  11.5 - 15.5 % Final  . Platelets 12/21/2012 200  150 - 400 K/uL Final  . Neutrophils Relative %  12/21/2012 67  43 - 77 % Final  . Neutro Abs 12/21/2012 2.7  1.7 - 7.7 K/uL Final  . Lymphocytes Relative 12/21/2012 15  12 - 46 % Final  . Lymphs Abs 12/21/2012 0.6* 0.7 - 4.0 K/uL Final  . Monocytes Relative 12/21/2012 10  3 - 12 % Final  . Monocytes Absolute 12/21/2012 0.4  0.1 - 1.0 K/uL Final  . Eosinophils Relative 12/21/2012 7* 0 - 5 % Final  . Eosinophils Absolute 12/21/2012 0.3  0.0 - 0.7 K/uL Final  . Basophils Relative 12/21/2012 1  0 - 1 % Final  . Basophils Absolute 12/21/2012 0.1  0.0 - 0.1 K/uL Final  . Sodium 12/21/2012 137  135 - 145 mEq/L Final  . Potassium 12/21/2012 4.6  3.5 - 5.1 mEq/L Final  . Chloride 12/21/2012 102  96 - 112 mEq/L Final  . CO2 12/21/2012 25  19 - 32 mEq/L Final  . Glucose, Bld 12/21/2012 93  70 - 99 mg/dL Final  . BUN 14/78/2956 16  6 - 23 mg/dL  Final  . Creatinine, Ser 12/21/2012 1.61* 0.50 - 1.35 mg/dL Final  . Calcium 16/10/9602 9.0  8.4 - 10.5 mg/dL Final  . Total Protein 12/21/2012 7.3  6.0 - 8.3 g/dL Final  . Albumin 54/09/8117 3.6  3.5 - 5.2 g/dL Final  . AST 14/78/2956 15  0 - 37 U/L Final  . ALT 12/21/2012 12  0 - 53 U/L Final  . Alkaline Phosphatase 12/21/2012 63  39 - 117 U/L Final  . Total Bilirubin 12/21/2012 0.3  0.3 - 1.2 mg/dL Final  . GFR calc non Af Amer 12/21/2012 40* >90 mL/min Final  . GFR calc Af Amer 12/21/2012 46* >90 mL/min Final   Comment: (NOTE)                          The eGFR has been calculated using the CKD EPI equation.                          This calculation has not been validated in all clinical situations.                          eGFR's persistently <90 mL/min signify possible Chronic Kidney                          Disease.  Infusion on 12/03/2012  Component Date Value Range Status  . WBC 12/03/2012 4.3  4.0 - 10.5 K/uL Final  . RBC 12/03/2012 3.53* 4.22 - 5.81 MIL/uL Final  . Hemoglobin 12/03/2012 11.4* 13.0 - 17.0 g/dL Final  . HCT 21/30/8657 33.7* 39.0 - 52.0 % Final  . MCV 12/03/2012 95.5  78.0 -  100.0 fL Final  . MCH 12/03/2012 32.3  26.0 - 34.0 pg Final  . MCHC 12/03/2012 33.8  30.0 - 36.0 g/dL Final  . RDW 84/69/6295 15.7* 11.5 - 15.5 % Final  . Platelets 12/03/2012 190  150 - 400 K/uL Final  . Neutrophils Relative % 12/03/2012 83* 43 - 77 % Final  . Neutro Abs 12/03/2012 3.5  1.7 - 7.7 K/uL Final  . Lymphocytes Relative 12/03/2012 10* 12 - 46 % Final  . Lymphs Abs 12/03/2012 0.4* 0.7 - 4.0 K/uL Final  . Monocytes Relative 12/03/2012 6  3 - 12 % Final  . Monocytes Absolute 12/03/2012 0.2  0.1 - 1.0 K/uL Final  . Eosinophils Relative 12/03/2012 1  0 - 5 % Final  . Eosinophils Absolute 12/03/2012 0.1  0.0 - 0.7 K/uL Final  . Basophils Relative 12/03/2012 1  0 - 1 % Final  . Basophils Absolute 12/03/2012 0.0  0.0 - 0.1 K/uL Final  . Sodium 12/03/2012 138  135 - 145 mEq/L Final  . Potassium 12/03/2012 4.3  3.5 - 5.1 mEq/L Final  . Chloride 12/03/2012 103  96 - 112 mEq/L Final  . CO2 12/03/2012 26  19 - 32 mEq/L Final  . Glucose, Bld 12/03/2012 132* 70 - 99 mg/dL Final  . BUN 28/41/3244 27* 6 - 23 mg/dL Final  . Creatinine, Ser 12/03/2012 1.63* 0.50 - 1.35 mg/dL Final  . Calcium 01/19/7251 9.1  8.4 - 10.5 mg/dL Final  . Total Protein 12/03/2012 7.4  6.0 - 8.3 g/dL Final  . Albumin 66/44/0347 3.5  3.5 - 5.2 g/dL Final  . AST 42/59/5638 12  0 - 37 U/L Final  . ALT 12/03/2012 8  0 -  53 U/L Final  . Alkaline Phosphatase 12/03/2012 63  39 - 117 U/L Final  . Total Bilirubin 12/03/2012 0.2* 0.3 - 1.2 mg/dL Final  . GFR calc non Af Amer 12/03/2012 39* >90 mL/min Final  . GFR calc Af Amer 12/03/2012 46* >90 mL/min Final   Comment: (NOTE)                          The eGFR has been calculated using the CKD EPI equation.                          This calculation has not been validated in all clinical situations.                          eGFR's persistently <90 mL/min signify possible Chronic Kidney                          Disease.  [name] Kitchen LDH 12/03/2012 177  94 - 250 U/L Final     PATHOLOGY: No new pathology. Will try to get access to the original pathology in an attempt to send for Foundation 1 analysis hoping that a basaloid features squamous cell malignancy is present so that treatment could be started with Erivedge should basaloid features with high activation of the Hedgehog be recognized per  Urinalysis    Component Value Date/Time   COLORURINE YELLOW 01/24/2012 1535   APPEARANCEUR CLEAR 01/24/2012 1535   LABSPEC 1.015 01/24/2012 1535   PHURINE 5.5 01/24/2012 1535   GLUCOSEU NEGATIVE 01/24/2012 1535   HGBUR NEGATIVE 01/24/2012 1535   BILIRUBINUR NEGATIVE 01/24/2012 1535   KETONESUR NEGATIVE 01/24/2012 1535   PROTEINUR TRACE* 01/24/2012 1535   UROBILINOGEN 0.2 01/24/2012 1535   NITRITE NEGATIVE 01/24/2012 1535   LEUKOCYTESUR NEGATIVE 01/24/2012 1535    RADIOGRAPHIC STUDIES: Ct Chest W Contrast  11/26/2012   CLINICAL DATA:  Restaging of carcinoma of larynx/ lung. Laryngeal carcinoma with metastasis to lung.  EXAM: CT CHEST, ABDOMEN  WITH CONTRAST  TECHNIQUE: Multidetector CT imaging of the chest, abdomen was performed following the standard protocol during bolus administration of intravenous contrast.  CONTRAST:  Omni 300  COMPARISON:  Chest CT 09/24/2012. PET 02/17/2012. No recent abdominal CTs. [name] Kitchen  FINDINGS: CT CHEST FINDINGS  Lungs/Pleura: Prior tracheostomy. Secretions within the dependent trachea. Surgical changes of left upper lobectomy. Moderate centrilobular emphysema.  Pleural-based nodularity at the right apex which measures 1.5 x 1.1 cm on image 7. 1.6 x 1.2 cm at the same level on the prior.  Clustered right middle lobe nodules on image 34/series 3 are similar .  Decreased left basilar nodularity. Maximally 7 mm on image 37 versus 1.4 x 1.1 cm on the prior. Suspect radiation changes within the left apex.  Small volume left-sided pleural fluid which is similar.  Heart/Mediastinum: No supraclavicular adenopathy. A right-sided Port-A-Cath which terminates at the low SVC.   Bovine arch. Advanced aortic atherosclerosis. Mild cardiomegaly with coronary artery atherosclerosis. Trace pericardial fluid is favored to be physiologic. No central pulmonary embolism, on this non-dedicated study. A left paratracheal node which measures 1.1 x 1.7 cm and appears similar 1.0 x 1.9 cm on the prior. Precarinal node measures 9 mm versus 1.0 cm on the prior. No hilar adenopathy. .  CT ABDOMEN FINDINGS  Abdomen: Normal liver, spleen, stomach, pancreas, gallbladder, biliary tract, adrenal glands.  Bilateral renal  cortical thinning. Moderate bilateral hydroureteronephrosis. Hydroureter is followed to the inferior portion of the exam, where a incompletely imaged but dilated urinary bladder is seen.  No retroperitoneal or retrocrural adenopathy. Normal abdominal large and small bowel loops, without ascites.  Bones/Musculoskeletal: Surgical changes of left-sided ribs.  IMPRESSION: CT CHEST IMPRESSION  1. Similar size of a right apical pulmonary nodule. 2. Similar to improved left lower lobe nodularity, favored to be post infectious or inflammatory. Similar nodularity in the right middle lobe. No evidence of new or progressive disease. 3. Similar borderline/mild mediastinal adenopathy. 4. Resolved airspace disease within the medial right middle lobe. 5. Similar small left pleural effusion.  CT ABDOMEN IMPRESSION  1. No acute process or evidence of metastatic disease in the abdomen. 2. Moderate bilateral hydroureteronephrosis. Incompletely imaged on this abdomen only exam. Likely related to bladder distention.   Electronically Signed   By: Jeronimo Greaves M.D.   On: 11/26/2012 16:18   Ct Abdomen W Contrast  11/26/2012   CLINICAL DATA:  Restaging of carcinoma of larynx/ lung. Laryngeal carcinoma with metastasis to lung.  EXAM: CT CHEST, ABDOMEN  WITH CONTRAST  TECHNIQUE: Multidetector CT imaging of the chest, abdomen was performed following the standard protocol during bolus administration of intravenous  contrast.  CONTRAST:  Omni 300  COMPARISON:  Chest CT 09/24/2012. PET 02/17/2012. No recent abdominal CTs. [name] Kitchen  FINDINGS: CT CHEST FINDINGS  Lungs/Pleura: Prior tracheostomy. Secretions within the dependent trachea. Surgical changes of left upper lobectomy. Moderate centrilobular emphysema.  Pleural-based nodularity at the right apex which measures 1.5 x 1.1 cm on image 7. 1.6 x 1.2 cm at the same level on the prior.  Clustered right middle lobe nodules on image 34/series 3 are similar .  Decreased left basilar nodularity. Maximally 7 mm on image 37 versus 1.4 x 1.1 cm on the prior. Suspect radiation changes within the left apex.  Small volume left-sided pleural fluid which is similar.  Heart/Mediastinum: No supraclavicular adenopathy. A right-sided Port-A-Cath which terminates at the low SVC.  Bovine arch. Advanced aortic atherosclerosis. Mild cardiomegaly with coronary artery atherosclerosis. Trace pericardial fluid is favored to be physiologic. No central pulmonary embolism, on this non-dedicated study. A left paratracheal node which measures 1.1 x 1.7 cm and appears similar 1.0 x 1.9 cm on the prior. Precarinal node measures 9 mm versus 1.0 cm on the prior. No hilar adenopathy. .  CT ABDOMEN FINDINGS  Abdomen: Normal liver, spleen, stomach, pancreas, gallbladder, biliary tract, adrenal glands.  Bilateral renal cortical thinning. Moderate bilateral hydroureteronephrosis. Hydroureter is followed to the inferior portion of the exam, where a incompletely imaged but dilated urinary bladder is seen.  No retroperitoneal or retrocrural adenopathy. Normal abdominal large and small bowel loops, without ascites.  Bones/Musculoskeletal: Surgical changes of left-sided ribs.  IMPRESSION: CT CHEST IMPRESSION  1. Similar size of a right apical pulmonary nodule. 2. Similar to improved left lower lobe nodularity, favored to be post infectious or inflammatory. Similar nodularity in the right middle lobe. No evidence of new or  progressive disease. 3. Similar borderline/mild mediastinal adenopathy. 4. Resolved airspace disease within the medial right middle lobe. 5. Similar small left pleural effusion.  CT ABDOMEN IMPRESSION  1. No acute process or evidence of metastatic disease in the abdomen. 2. Moderate bilateral hydroureteronephrosis. Incompletely imaged on this abdomen only exam. Likely related to bladder distention.   Electronically Signed   By: Jeronimo Greaves M.D.   On: 11/26/2012 16:18    ASSESSMENT:  #1. Metastatic squamous cell  malignancy in the lung either representing lung cancer with lung metastases or larynx cancer with lung metastases, squamous cell histology, currently on Tarceva 150 mg daily after 6 cycles of carboplatin and Taxol, tolerating well. #2. Cardiomyopathy, stable, no symptoms of heart failure or dysrhythmia.   PLAN:  #1. Continue Tarceva 150 mg daily. #2. He notify bedroom to increase relative humidity #3. Followup in 4 weeks with lab tests and physical exam.   All questions were answered. The patient knows to call the clinic with any problems, questions or concerns. We can certainly see the patient much sooner if necessary.   I spent 25 minutes counseling the patient face to face. The total time spent in the appointment was 30 minutes.    Jeffrey Lovely, MD 12/24/2012 11:33 AM

## 2012-12-25 ENCOUNTER — Other Ambulatory Visit: Payer: Self-pay | Admitting: *Deleted

## 2012-12-25 MED ORDER — LORAZEPAM 1 MG PO TABS: ORAL_TABLET | ORAL | Status: DC

## 2013-01-07 NOTE — Telephone Encounter (Signed)
Ok plus 2 ref 

## 2013-01-21 ENCOUNTER — Other Ambulatory Visit (HOSPITAL_COMMUNITY): Payer: Managed Care, Other (non HMO)

## 2013-01-21 ENCOUNTER — Encounter (HOSPITAL_COMMUNITY): Payer: Self-pay

## 2013-01-21 ENCOUNTER — Encounter (HOSPITAL_BASED_OUTPATIENT_CLINIC_OR_DEPARTMENT_OTHER): Payer: Managed Care, Other (non HMO)

## 2013-01-21 ENCOUNTER — Encounter (HOSPITAL_COMMUNITY): Payer: Managed Care, Other (non HMO) | Attending: Oncology

## 2013-01-21 VITALS — BP 114/73 | HR 88 | Temp 97.8°F | Resp 18 | Wt 138.8 lb

## 2013-01-21 DIAGNOSIS — C801 Malignant (primary) neoplasm, unspecified: Secondary | ICD-10-CM | POA: Insufficient documentation

## 2013-01-21 DIAGNOSIS — C799 Secondary malignant neoplasm of unspecified site: Secondary | ICD-10-CM

## 2013-01-21 DIAGNOSIS — C3490 Malignant neoplasm of unspecified part of unspecified bronchus or lung: Secondary | ICD-10-CM

## 2013-01-21 DIAGNOSIS — C7800 Secondary malignant neoplasm of unspecified lung: Secondary | ICD-10-CM

## 2013-01-21 LAB — COMPREHENSIVE METABOLIC PANEL
ALBUMIN: 3.6 g/dL (ref 3.5–5.2)
ALK PHOS: 63 U/L (ref 39–117)
ALT: 8 U/L (ref 0–53)
AST: 13 U/L (ref 0–37)
BUN: 22 mg/dL (ref 6–23)
CO2: 26 mEq/L (ref 19–32)
Calcium: 9 mg/dL (ref 8.4–10.5)
Chloride: 103 mEq/L (ref 96–112)
Creatinine, Ser: 1.45 mg/dL — ABNORMAL HIGH (ref 0.50–1.35)
GFR calc non Af Amer: 45 mL/min — ABNORMAL LOW (ref >90)
GFR, EST AFRICAN AMERICAN: 52 mL/min — AB (ref >90)
Glucose, Bld: 99 mg/dL (ref 70–99)
POTASSIUM: 4.2 meq/L (ref 3.7–5.3)
Sodium: 139 mEq/L (ref 137–147)
TOTAL PROTEIN: 7.2 g/dL (ref 6.0–8.3)
Total Bilirubin: 0.5 mg/dL (ref 0.3–1.2)

## 2013-01-21 LAB — CBC WITH DIFFERENTIAL/PLATELET
BASOS PCT: 1 % (ref 0–1)
Basophils Absolute: 0 10*3/uL (ref 0.0–0.1)
Eosinophils Absolute: 0.3 10*3/uL (ref 0.0–0.7)
Eosinophils Relative: 6 % — ABNORMAL HIGH (ref 0–5)
HCT: 36 % — ABNORMAL LOW (ref 39.0–52.0)
HEMOGLOBIN: 12 g/dL — AB (ref 13.0–17.0)
LYMPHS ABS: 0.6 10*3/uL — AB (ref 0.7–4.0)
Lymphocytes Relative: 13 % (ref 12–46)
MCH: 31.2 pg (ref 26.0–34.0)
MCHC: 33.3 g/dL (ref 30.0–36.0)
MCV: 93.5 fL (ref 78.0–100.0)
MONOS PCT: 7 % (ref 3–12)
Monocytes Absolute: 0.3 10*3/uL (ref 0.1–1.0)
NEUTROS PCT: 74 % (ref 43–77)
Neutro Abs: 3.2 10*3/uL (ref 1.7–7.7)
PLATELETS: 177 10*3/uL (ref 150–400)
RBC: 3.85 MIL/uL — ABNORMAL LOW (ref 4.22–5.81)
RDW: 14.2 % (ref 11.5–15.5)
WBC: 4.4 10*3/uL (ref 4.0–10.5)

## 2013-01-21 MED ORDER — SODIUM CHLORIDE 0.9 % IJ SOLN
10.0000 mL | INTRAMUSCULAR | Status: DC | PRN
  Administered 2013-01-21: 10 mL via INTRAVENOUS

## 2013-01-21 MED ORDER — HEPARIN SOD (PORK) LOCK FLUSH 100 UNIT/ML IV SOLN
500.0000 [IU] | Freq: Once | INTRAVENOUS | Status: AC
  Administered 2013-01-21: 500 [IU] via INTRAVENOUS
  Filled 2013-01-21: qty 5

## 2013-01-21 NOTE — Progress Notes (Signed)
[place]  OFFICE PROGRESS NOTE  [name], MD [place] [address]  DIAGNOSIS: Recurrent lung squamous cell carcinoma, unspecified laterality  Lung cancer, unspecified laterality  Chief Complaint  Patient presents with  . Lung Cancer    Squamous cell type    CURRENT THERAPY: Tarceva 150 mg daily on an empty stomach.  INTERVAL HISTORY: Jeffrey [name] 80 y.o. male returns for followup of metastatic squamous cell carcinoma, currently on Tarceva 150 mg daily.  He continues to do well taking Tarceva 150 mg daily on an empty stomach. He denies any nausea, vomiting, sore mouth, diarrhea, constipation, skin rash, worsening cough, hemoptysis, or any problems with his tracheostomy. Appetite has been good with no dysphagia, headache, or seizures. He also denies any significant peripheral paresthesias.  MEDICAL HISTORY: Past Medical History  Diagnosis Date  . Tracheostomy in place   . Cancer 2010    laryngeal  . Lung cancer 2010  . Pneumonia 2014  . Dysrhythmia     takes Coreg and Lisinopril daily  . Shortness of breath     with exertion  . Enlarged prostate     takes flomax  . Urinary urgency   . Gastric ulcer     hx of  . Hx of transfusion of packed red blood cells     no abnormal reaction noted by patient  . Umbilical hernia   . Metastatic cancer 07/24/2012  . Hyperlipidemia   . ED (erectile dysfunction)   . Insomnia   . CHF (congestive heart failure)     INTERIM HISTORY: has TOBACCO ABUSE; EAR PAIN, LEFT; C O P D; NECK PAIN, LEFT; POSTNASAL DRIP SYNDROME; UNSPECIFIED RESPIRATORY ABNORMALITY; History of laryngeal cancer; History of lung cancer; Tracheostomy in place; Elevated brain natriuretic peptide (BNP) level; Pulmonary nodules; Bilateral hydronephrosis; Cardiomyopathy; Metastatic cancer; Prostate hypertrophy; and Insomnia on his problem list.   Tarceva 150 mg daily preceded by 6 cycles of  carboplatin/Taxol:  Chemotherapy  07/31/2012  08/20/2012  09/10/2012   Day, Cycle  Day 1, Cycle 1  Day 1, Cycle 2  Day 1, Cycle 3   CARBOplatin (PARAPLATIN) IV  330 mg  330 mg  330 mg   PACLitaxel (TAXOL) IV  175 mg/m2  175 mg/m2  175 mg/m2    Chemotherapy  10/01/2012  10/22/2012  11/12/2012   Day, Cycle  Day 1, Cycle 4  Day 1, Cycle 5  Day 1, Cycle 6   CARBOplatin (PARAPLATIN) IV  330 mg  330 mg  330 mg   PACLitaxel (TAXOL) IV  175 mg/m2  175 mg/m2  175 mg/m2     ALLERGIES:  has no active allergies.  MEDICATIONS: has a current medication list which includes the following prescription(s): aspirin, bisacodyl, carvedilol, erlotinib, esomeprazole, hydrocodone-acetaminophen, lisinopril, lorazepam, ondansetron, tamsulosin, lidocaine-prilocaine, and naproxen sodium, and the following Facility-Administered Medications: sodium chloride.  SURGICAL HISTORY:  Past Surgical History  Procedure Laterality Date  . Tracheostomy  2010  . Lung removal, partial  2010    left side  . Layngectomy for laryngeal cancer in2010; left upper lobectomy for lung cancer, 2010.    . Back surgery      thinks it was done in 2012  . Esophagogastroduodenoscopy    . Colonoscopy    . Portacath placement Bilateral 07/12/2012    Procedure: INSERTION PORT-A-CATH;  Surgeon: [name], MD;  Location: [facility];  Service: Thoracic;  Laterality: Bilateral;  FAMILY HISTORY: family history includes Diabetes in his father; Heart disease in his father.  SOCIAL HISTORY:  reports that he has been smoking Cigarettes.  He has been smoking about 1.00 pack per day. He has never used smokeless tobacco. He reports that he does not drink alcohol or use illicit drugs.  REVIEW OF SYSTEMS:  Other than that discussed above is noncontributory.  PHYSICAL EXAMINATION: ECOG PERFORMANCE STATUS: 0 - Asymptomatic  Blood pressure 114/73, pulse 88, temperature 97.8 F (36.6 C), temperature source Oral, resp. rate 18, weight 138 lb 12.8 oz (62.959  kg).  GENERAL:alert, no distress and comfortable. In excellent spirits. SKIN: skin color, texture, turgor are normal, no rashes or significant lesions EYES: PERLA; Conjunctiva are pink and non-injected, sclera clear OROPHARYNX:no exudate, no erythema on lips, buccal mucosa, or tongue. NECK: supple, thyroid normal size, non-tender, without nodularity. No masses. Tracheostomy in place with no evidence of stomal hemorrhage or purulence. CHEST: Increased AP diameter with no gynecomastia.. MediPort in place. LYMPH:  no palpable lymphadenopathy in the cervical, axillary or inguinal LUNGS: clear to auscultation and percussion with normal breathing effort HEART: regular rate & rhythm and no murmurs. ABDOMEN:abdomen soft, non-tender and normal bowel sounds MUSCULOSKELETAL:no cyanosis of digits and no clubbing. Range of motion normal.  NEURO: alert & oriented x 3 with fluent speech, no focal motor/sensory deficits   LABORATORY DATA: Appointment on 01/21/2013  Component Date Value Range Status  . WBC 01/21/2013 4.4  4.0 - 10.5 K/uL Final  . RBC 01/21/2013 3.85* 4.22 - 5.81 MIL/uL Final  . Hemoglobin 01/21/2013 12.0* 13.0 - 17.0 g/dL Final  . HCT 01/21/2013 36.0* 39.0 - 52.0 % Final  . MCV 01/21/2013 93.5  78.0 - 100.0 fL Final  . MCH 01/21/2013 31.2  26.0 - 34.0 pg Final  . MCHC 01/21/2013 33.3  30.0 - 36.0 g/dL Final  . RDW 01/21/2013 14.2  11.5 - 15.5 % Final  . Platelets 01/21/2013 177  150 - 400 K/uL Final  . Neutrophils Relative % 01/21/2013 74  43 - 77 % Final  . Neutro Abs 01/21/2013 3.2  1.7 - 7.7 K/uL Final  . Lymphocytes Relative 01/21/2013 13  12 - 46 % Final  . Lymphs Abs 01/21/2013 0.6* 0.7 - 4.0 K/uL Final  . Monocytes Relative 01/21/2013 7  3 - 12 % Final  . Monocytes Absolute 01/21/2013 0.3  0.1 - 1.0 K/uL Final  . Eosinophils Relative 01/21/2013 6* 0 - 5 % Final  . Eosinophils Absolute 01/21/2013 0.3  0.0 - 0.7 K/uL Final  . Basophils Relative 01/21/2013 1  0 - 1 % Final  .  Basophils Absolute 01/21/2013 0.0  0.0 - 0.1 K/uL Final  . Sodium 01/21/2013 139  137 - 147 mEq/L Final  . Potassium 01/21/2013 4.2  3.7 - 5.3 mEq/L Final  . Chloride 01/21/2013 103  96 - 112 mEq/L Final  . CO2 01/21/2013 26  19 - 32 mEq/L Final  . Glucose, Bld 01/21/2013 99  70 - 99 mg/dL Final  . BUN 01/21/2013 22  6 - 23 mg/dL Final  . Creatinine, Ser 01/21/2013 1.45* 0.50 - 1.35 mg/dL Final  . Calcium 01/21/2013 9.0  8.4 - 10.5 mg/dL Final  . Total Protein 01/21/2013 7.2  6.0 - 8.3 g/dL Final  . Albumin 01/21/2013 3.6  3.5 - 5.2 g/dL Final  . AST 01/21/2013 13  0 - 37 U/L Final  . ALT 01/21/2013 8  0 - 53 U/L Final  . Alkaline Phosphatase 01/21/2013 63  39 - 117 U/L Final  . Total Bilirubin 01/21/2013 0.5  0.3 - 1.2 mg/dL Final  . GFR calc non Af Amer 01/21/2013 45* >90 mL/min Final  . GFR calc Af Amer 01/21/2013 52* >90 mL/min Final   Comment: (NOTE)                          The eGFR has been calculated using the CKD EPI equation.                          This calculation has not been validated in all clinical situations.                          eGFR's persistently <90 mL/min signify possible Chronic Kidney                          Disease.    PATHOLOGY: No new pathology.  Urinalysis    Component Value Date/Time   COLORURINE YELLOW 01/24/2012 1535   APPEARANCEUR CLEAR 01/24/2012 1535   LABSPEC 1.015 01/24/2012 1535   PHURINE 5.5 01/24/2012 [place] 01/24/2012 1535   HGBUR NEGATIVE 01/24/2012 1535   BILIRUBINUR NEGATIVE 01/24/2012 1535   KETONESUR NEGATIVE 01/24/2012 1535   PROTEINUR TRACE* 01/24/2012 1535   UROBILINOGEN 0.2 01/24/2012 1535   NITRITE NEGATIVE 01/24/2012 1535   LEUKOCYTESUR NEGATIVE 01/24/2012 1535    RADIOGRAPHIC STUDIES: CT Chest W Contrast Status: Final result         PACS Images    Show images for CT Chest W Contrast         Study Result    CLINICAL DATA: Restaging of carcinoma of larynx/ lung. 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   EXAM:  CT CHEST, ABDOMEN WITH CONTRAST  TECHNIQUE:  Multidetector CT imaging of the chest, abdomen was performed  following the standard protocol during bolus administration of  intravenous contrast.  CONTRAST: 168m Omni 300  COMPARISON: Chest CT 09/24/2012. PET 02/17/2012. No recent  abdominal CTs. .[name] Kitchen FINDINGS:  CT CHEST FINDINGS  Lungs/Pleura: Prior tracheostomy. Secretions within the dependent  trachea. Surgical changes of left upper lobectomy. Moderate  centrilobular emphysema.  Pleural-based nodularity at the right apex which measures 1.5 x 1.1  cm on image 7. 1.6 x 1.2 cm at the same level on the prior.  Clustered right middle lobe nodules on image 34/series 3 are similar  .  Decreased left basilar nodularity. Maximally 7 mm on image 37 versus  1.4 x 1.1 cm on the prior. Suspect radiation changes within the left  apex.  Small volume left-sided pleural fluid which is similar.  Heart/Mediastinum: No supraclavicular adenopathy. A right-sided  Port-A-Cath which terminates at the low SVC.  Bovine arch. Advanced aortic atherosclerosis. Mild cardiomegaly with  coronary artery atherosclerosis. Trace pericardial fluid is favored  to be physiologic. No central pulmonary embolism, on this  non-dedicated study. A left paratracheal node which measures 1.1 x  1.7 cm and appears similar 1.0 x 1.9 cm on the prior. Precarinal  node measures 9 mm versus 1.0 cm on the prior. No hilar adenopathy.  .  CT ABDOMEN FINDINGS  Abdomen: Normal liver, spleen, stomach, pancreas, gallbladder,  biliary tract, adrenal glands.  Bilateral renal cortical thinning. Moderate bilateral  hydroureteronephrosis. Hydroureter is followed to the inferior  portion of the exam, where a incompletely  imaged but dilated urinary  bladder is seen.  No retroperitoneal or retrocrural adenopathy. Normal abdominal large  and small bowel loops, without ascites.  Bones/Musculoskeletal: Surgical changes of left-sided ribs.    IMPRESSION:  CT CHEST IMPRESSION  1. Similar size of a right apical pulmonary nodule.  2. Similar to improved left lower lobe nodularity, favored to be  post infectious or inflammatory. Similar nodularity in the right  middle lobe. No evidence of new or progressive disease.  3. Similar borderline/mild mediastinal adenopathy.  4. Resolved airspace disease within the medial right middle lobe.  5. Similar small left pleural effusion.  CT ABDOMEN IMPRESSION  1. No acute process or evidence of metastatic disease in the  abdomen.  2. Moderate bilateral hydroureteronephrosis. Incompletely imaged on  this abdomen only exam. Likely related to bladder distention.  Electronically Signed  By: Abigail Miyamoto M.D.  On: 11/26/2012    .  ASSESSMENT:  #1.Metastatic squamous cell malignancy in the lung either representing lung cancer with lung metastases or larynx cancer with lung metastases, squamous cell histology, currently on Tarceva 150 mg daily after 6 cycles of carboplatin and Taxol, tolerating well.  #2. Cardiomyopathy, stable, no symptoms of heart failure or dysrhythmia    PLAN:  #1. Continue Tarceva 150 mg daily. #2. Continue to use humidifier in bed room. #3. Repeat CT scan of the chest on 02/12/2013. #4. Followup in 4 weeks with lab tests. #5. Flush MediPort today.   All questions were answered. The patient knows to call the clinic with any problems, questions or concerns. We can certainly see the patient much sooner if necessary.   I spent 25 minutes counseling the patient face to face. The total time spent in the appointment was 30 minutes.    Doroteo Bradford, MD 01/21/2013 1:30 PM

## 2013-01-21 NOTE — Patient Instructions (Signed)
[facility] Discharge Instructions  RECOMMENDATIONS MADE BY THE CONSULTANT AND ANY TEST RESULTS WILL BE SENT TO YOUR REFERRING PHYSICIAN.  EXAM FINDINGS BY THE PHYSICIAN TODAY AND SIGNS OR SYMPTOMS TO REPORT TO CLINIC OR PRIMARY PHYSICIAN: Exam and findings as discussed by Dr. Barnet Glasgow.  You are doing well.  If any problems with your blood work we will let you know.  Report fevers, chills or other problems.  MEDICATIONS PRESCRIBED:  none  INSTRUCTIONS/FOLLOW-UP: CT scans as scheduled and follow-up in 4 weeks.  Thank you for choosing [facility] to provide your oncology and hematology care.  To afford each patient quality time with our providers, please arrive at least 15 minutes before your scheduled appointment time.  With your help, our goal is to use those 15 minutes to complete the necessary work-up to ensure our physicians have the information they need to help with your evaluation and healthcare recommendations.    Effective January 1st, 2014, we ask that you re-schedule your appointment with our physicians should you arrive 10 or more minutes late for your appointment.  We strive to give you quality time with our providers, and arriving late affects you and other patients whose appointments are after yours.    Again, thank you for choosing [facility]  Our hope is that these requests will decrease the amount of time that you wait before being seen by our physicians.       _____________________________________________________________  Should you have questions after your visit to [facility], please contact our office at [phone] between the hours of 8:30 a.m. and 5:00 p.m.  Voicemails left after 4:30 p.m. will not be returned until the following business day.  For prescription refill requests, have your pharmacy contact our office with your prescription refill request.

## 2013-01-21 NOTE — Progress Notes (Signed)
[name] presented for Portacath access and flush. Proper placement of portacath confirmed by CXR. Portacath located right chest wall accessed with  H 20 needle. Good blood return present. Portacath flushed with 58ml NS and 500U/54ml Heparin and needle removed intact. Procedure without incident. Patient tolerated procedure well.

## 2013-02-11 ENCOUNTER — Ambulatory Visit (HOSPITAL_COMMUNITY)
Admission: RE | Admit: 2013-02-11 | Discharge: 2013-02-11 | Disposition: A | Discharge status: Home / Self Care | Payer: Managed Care, Other (non HMO) | Source: Ambulatory Visit | Attending: Oncology | Admitting: Oncology

## 2013-02-11 DIAGNOSIS — C329 Malignant neoplasm of larynx, unspecified: Secondary | ICD-10-CM | POA: Insufficient documentation

## 2013-02-11 DIAGNOSIS — C3490 Malignant neoplasm of unspecified part of unspecified bronchus or lung: Secondary | ICD-10-CM | POA: Insufficient documentation

## 2013-02-11 DIAGNOSIS — J90 Pleural effusion, not elsewhere classified: Secondary | ICD-10-CM | POA: Insufficient documentation

## 2013-02-11 DIAGNOSIS — R911 Solitary pulmonary nodule: Secondary | ICD-10-CM | POA: Insufficient documentation

## 2013-02-11 DIAGNOSIS — C799 Secondary malignant neoplasm of unspecified site: Secondary | ICD-10-CM

## 2013-02-18 ENCOUNTER — Encounter (HOSPITAL_COMMUNITY): Payer: Self-pay

## 2013-02-18 ENCOUNTER — Encounter (HOSPITAL_BASED_OUTPATIENT_CLINIC_OR_DEPARTMENT_OTHER): Payer: Managed Care, Other (non HMO)

## 2013-02-18 ENCOUNTER — Encounter (HOSPITAL_COMMUNITY): Payer: Managed Care, Other (non HMO) | Attending: Oncology

## 2013-02-18 VITALS — BP 123/80 | HR 81 | Temp 97.7°F | Resp 20 | Wt 137.5 lb

## 2013-02-18 DIAGNOSIS — Z9889 Other specified postprocedural states: Secondary | ICD-10-CM | POA: Insufficient documentation

## 2013-02-18 DIAGNOSIS — C50919 Malignant neoplasm of unspecified site of unspecified female breast: Secondary | ICD-10-CM

## 2013-02-18 DIAGNOSIS — C801 Malignant (primary) neoplasm, unspecified: Secondary | ICD-10-CM | POA: Insufficient documentation

## 2013-02-18 DIAGNOSIS — C779 Secondary and unspecified malignant neoplasm of lymph node, unspecified: Secondary | ICD-10-CM

## 2013-02-18 DIAGNOSIS — C7800 Secondary malignant neoplasm of unspecified lung: Secondary | ICD-10-CM

## 2013-02-18 DIAGNOSIS — C3490 Malignant neoplasm of unspecified part of unspecified bronchus or lung: Secondary | ICD-10-CM

## 2013-02-18 DIAGNOSIS — C799 Secondary malignant neoplasm of unspecified site: Secondary | ICD-10-CM

## 2013-02-18 DIAGNOSIS — R918 Other nonspecific abnormal finding of lung field: Secondary | ICD-10-CM

## 2013-02-18 DIAGNOSIS — D72829 Elevated white blood cell count, unspecified: Secondary | ICD-10-CM

## 2013-02-18 DIAGNOSIS — I43 Cardiomyopathy in diseases classified elsewhere: Secondary | ICD-10-CM

## 2013-02-18 LAB — COMPREHENSIVE METABOLIC PANEL
ALT: 11 U/L (ref 0–53)
AST: 16 U/L (ref 0–37)
Albumin: 3.4 g/dL — ABNORMAL LOW (ref 3.5–5.2)
Alkaline Phosphatase: 68 U/L (ref 39–117)
BUN: 20 mg/dL (ref 6–23)
CALCIUM: 8.7 mg/dL (ref 8.4–10.5)
CO2: 25 meq/L (ref 19–32)
CREATININE: 1.45 mg/dL — AB (ref 0.50–1.35)
Chloride: 104 mEq/L (ref 96–112)
GFR, EST AFRICAN AMERICAN: 52 mL/min — AB (ref >90)
GFR, EST NON AFRICAN AMERICAN: 45 mL/min — AB (ref >90)
GLUCOSE: 97 mg/dL (ref 70–99)
Potassium: 4.1 mEq/L (ref 3.7–5.3)
Sodium: 140 mEq/L (ref 137–147)
TOTAL PROTEIN: 7.2 g/dL (ref 6.0–8.3)
Total Bilirubin: 0.4 mg/dL (ref 0.3–1.2)

## 2013-02-18 LAB — CBC WITH DIFFERENTIAL/PLATELET
BASOS ABS: 0 10*3/uL (ref 0.0–0.1)
Basophils Relative: 1 % (ref 0–1)
EOS PCT: 6 % — AB (ref 0–5)
Eosinophils Absolute: 0.2 10*3/uL (ref 0.0–0.7)
HEMATOCRIT: 38.4 % — AB (ref 39.0–52.0)
Hemoglobin: 12.8 g/dL — ABNORMAL LOW (ref 13.0–17.0)
LYMPHS ABS: 0.6 10*3/uL — AB (ref 0.7–4.0)
LYMPHS PCT: 16 % (ref 12–46)
MCH: 30.8 pg (ref 26.0–34.0)
MCHC: 33.3 g/dL (ref 30.0–36.0)
MCV: 92.5 fL (ref 78.0–100.0)
MONO ABS: 0.3 10*3/uL (ref 0.1–1.0)
MONOS PCT: 8 % (ref 3–12)
NEUTROS ABS: 2.7 10*3/uL (ref 1.7–7.7)
Neutrophils Relative %: 70 % (ref 43–77)
Platelets: 174 10*3/uL (ref 150–400)
RBC: 4.15 MIL/uL — AB (ref 4.22–5.81)
RDW: 13.9 % (ref 11.5–15.5)
WBC: 3.8 10*3/uL — ABNORMAL LOW (ref 4.0–10.5)

## 2013-02-18 NOTE — Progress Notes (Signed)
Labs drawn today for cbc/diff,cmp,ldh

## 2013-02-18 NOTE — Patient Instructions (Signed)
[place] Discharge Instructions  RECOMMENDATIONS MADE BY THE CONSULTANT AND ANY TEST RESULTS WILL BE SENT TO YOUR REFERRING PHYSICIAN.  EXAM FINDINGS BY THE PHYSICIAN TODAY AND SIGNS OR SYMPTOMS TO REPORT TO CLINIC OR PRIMARY PHYSICIAN: Exam and findings as discussed by Dr. Barnet Glasgow.  Your scan is stable.  Will not make any changes at present.  Report chest pain, increased shortness of breath, blood in your sputum, unexplained weight loss, etc.  MEDICATIONS PRESCRIBED:  Continue Tarceva   INSTRUCTIONS/FOLLOW-UP: Follow-up in 1 month with blood work and office visit.  Thank you for choosing [facility] to provide your oncology and hematology care.  To afford each patient quality time with our providers, please arrive at least 15 minutes before your scheduled appointment time.  With your help, our goal is to use those 15 minutes to complete the necessary work-up to ensure our physicians have the information they need to help with your evaluation and healthcare recommendations.    Effective January 1st, 2014, we ask that you re-schedule your appointment with our physicians should you arrive 10 or more minutes late for your appointment.  We strive to give you quality time with our providers, and arriving late affects you and other patients whose appointments are after yours.    Again, thank you for choosing [facility]  Our hope is that these requests will decrease the amount of time that you wait before being seen by our physicians.       _____________________________________________________________  Should you have questions after your visit to [facility], please contact our office at [phone] between the hours of 8:30 a.m. and 5:00 p.m.  Voicemails left after 4:30 p.m. will not be returned until the following business day.  For prescription refill requests, have your pharmacy contact our office with your prescription refill  request.

## 2013-02-18 NOTE — Progress Notes (Signed)
[place]  OFFICE PROGRESS NOTE  [name], MD [place] [id number]  DIAGNOSIS: Lung cancer  Pulmonary nodules  Chief Complaint  Patient presents with  . Non-small cell lung cancer, squamous cell type    Tarceva maintenance therapy    CURRENT THERAPY: Tarceva 150 mg daily maintenance.  INTERVAL HISTORY: Jeffrey [name] 80 y.o. 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  He continues to tolerate Tarceva well with no episodes of skin rash or diarrhea. Appetite is good with no nausea, vomiting, melena, hematochezia, hematuria, worsening cough or wheezing, PND, orthopnea, palpitations, lower extremity swelling or redness, headache, or seizures.   MEDICAL HISTORY: Past Medical History  Diagnosis Date  . Tracheostomy in place   . Cancer 2010    laryngeal  . Lung cancer 2010  . Pneumonia 2014  . Dysrhythmia     takes Coreg and Lisinopril daily  . Shortness of breath     with exertion  . Enlarged prostate     takes flomax  . Urinary urgency   . Gastric ulcer     hx of  . Hx of transfusion of packed red blood cells     no abnormal reaction noted by patient  . Umbilical hernia   . Metastatic cancer 07/24/2012  . Hyperlipidemia   . ED (erectile dysfunction)   . Insomnia   . CHF (congestive heart failure)     INTERIM HISTORY: has TOBACCO ABUSE; EAR PAIN, LEFT; C O P D; NECK PAIN, LEFT; POSTNASAL DRIP SYNDROME; UNSPECIFIED RESPIRATORY ABNORMALITY; History of laryngeal cancer; History of lung cancer; Tracheostomy in place; Elevated brain natriuretic peptide (BNP) level; Pulmonary nodules; Bilateral hydronephrosis; Cardiomyopathy; Metastatic cancer; Prostate hypertrophy; and Insomnia on his problem list.   Tarceva 150 mg daily preceded by  6 cycles of carboplatin/Taxol:  Chemotherapy  07/31/2012  08/20/2012  09/10/2012   Day, Cycle  Day 1, Cycle 1  Day 1, Cycle 2  Day 1, Cycle 3   CARBOplatin (PARAPLATIN) IV  330 mg  330 mg  330 mg   PACLitaxel (TAXOL) IV  175 mg/m2  175 mg/m2  175 mg/m2    Chemotherapy  10/01/2012  10/22/2012  11/12/2012   Day, Cycle  Day 1, Cycle 4  Day 1, Cycle 5  Day 1, Cycle 6   CARBOplatin (PARAPLATIN) IV  330 mg  330 mg  330 mg   PACLitaxel (TAXOL) IV  175 mg/m2  175 mg/m2  175 mg/m2     ALLERGIES:  has no active allergies.  MEDICATIONS: has a current medication list which includes the following prescription(s): aspirin, bisacodyl, carvedilol, esomeprazole, hydrocodone-acetaminophen, lidocaine-prilocaine, lisinopril, lorazepam, ondansetron, tamsulosin, erlotinib, and naproxen sodium.  SURGICAL HISTORY:  Past Surgical History  Procedure Laterality Date  . Tracheostomy  2010  . Lung removal, partial  2010    left side  . Layngectomy for laryngeal cancer in2010; left upper lobectomy for lung cancer, 2010.    . Back surgery      thinks it was done in 2012  . Esophagogastroduodenoscopy    . Colonoscopy    . Portacath placement Bilateral 07/12/2012    Procedure: INSERTION PORT-A-CATH;  Surgeon: [name], MD;  Location: [facility];  Service: Thoracic;  Laterality: Bilateral;    FAMILY  HISTORY: family history includes Diabetes in his father; Heart disease in his father.  SOCIAL HISTORY:  reports that he has been smoking Cigarettes.  He has been smoking about 1.00 pack per day. He has never used smokeless tobacco. He reports that he does not drink alcohol or use illicit drugs.  REVIEW OF SYSTEMS:  Other than that discussed above is noncontributory.  PHYSICAL EXAMINATION: ECOG PERFORMANCE STATUS: 1 - Symptomatic but completely ambulatory  Blood pressure 123/80, pulse 81, temperature 97.7 F (36.5 C), temperature source Oral, resp. rate 20, weight 137 lb 8 oz (62.37 kg), SpO2 97.00%.  GENERAL:alert,  no distress and comfortable SKIN: skin color, texture, turgor are normal, no rashes or significant lesions EYES: PERLA; Conjunctiva are pink and non-injected, sclera clear OROPHARYNX:no exudate, no erythema on lips, buccal mucosa, or tongue. NECK: supple, thyroid normal size, non-tender, without nodularity. No masses. Tracheostomy in place with no evidence of purulence or hemorrhage. CHEST: Increased AP diameter with no gynecomastia. LYMPH:  no palpable lymphadenopathy in the cervical, axillary or inguinal LUNGS: clear to auscultation and percussion with normal breathing effort HEART: regular rate & rhythm and no murmurs. ABDOMEN:abdomen soft, non-tender and normal bowel sounds MUSCULOSKELETAL:no cyanosis of digits and no clubbing. Range of motion normal.  NEURO: alert & oriented x 3 with fluent speech, no focal motor/sensory deficits   LABORATORY DATA: Infusion on 02/18/2013  Component Date Value Range Status  . WBC 02/18/2013 3.8* 4.0 - 10.5 K/uL Final  . RBC 02/18/2013 4.15* 4.22 - 5.81 MIL/uL Final  . Hemoglobin 02/18/2013 12.8* 13.0 - 17.0 g/dL Final  . HCT 02/18/2013 38.4* 39.0 - 52.0 % Final  . MCV 02/18/2013 92.5  78.0 - 100.0 fL Final  . MCH 02/18/2013 30.8  26.0 - 34.0 pg Final  . MCHC 02/18/2013 33.3  30.0 - 36.0 g/dL Final  . RDW 02/18/2013 13.9  11.5 - 15.5 % Final  . Platelets 02/18/2013 174  150 - 400 K/uL Final  . Neutrophils Relative % 02/18/2013 70  43 - 77 % Final  . Neutro Abs 02/18/2013 2.7  1.7 - 7.7 K/uL Final  . Lymphocytes Relative 02/18/2013 16  12 - 46 % Final  . Lymphs Abs 02/18/2013 0.6* 0.7 - 4.0 K/uL Final  . Monocytes Relative 02/18/2013 8  3 - 12 % Final  . Monocytes Absolute 02/18/2013 0.3  0.1 - 1.0 K/uL Final  . Eosinophils Relative 02/18/2013 6* 0 - 5 % Final  . Eosinophils Absolute 02/18/2013 0.2  0.0 - 0.7 K/uL Final  . Basophils Relative 02/18/2013 1  0 - 1 % Final  . Basophils Absolute 02/18/2013 0.0  0.0 - 0.1 K/uL Final  . Sodium  02/18/2013 140  137 - 147 mEq/L Final  . Potassium 02/18/2013 4.1  3.7 - 5.3 mEq/L Final  . Chloride 02/18/2013 104  96 - 112 mEq/L Final  . CO2 02/18/2013 25  19 - 32 mEq/L Final  . Glucose, Bld 02/18/2013 97  70 - 99 mg/dL Final  . BUN 02/18/2013 20  6 - 23 mg/dL Final  . Creatinine, Ser 02/18/2013 1.45* 0.50 - 1.35 mg/dL Final  . Calcium 02/18/2013 8.7  8.4 - 10.5 mg/dL Final  . Total Protein 02/18/2013 7.2  6.0 - 8.3 g/dL Final  . Albumin 02/18/2013 3.4* 3.5 - 5.2 g/dL Final  . AST 02/18/2013 16  0 - 37 U/L Final  . ALT 02/18/2013 11  0 - 53 U/L Final  . Alkaline Phosphatase 02/18/2013 68  39 - 117 U/L  Final  . Total Bilirubin 02/18/2013 0.4  0.3 - 1.2 mg/dL Final  . GFR calc non Af Amer 02/18/2013 45* >90 mL/min Final  . GFR calc Af Amer 02/18/2013 52* >90 mL/min Final   Comment: (NOTE)                          The eGFR has been calculated using the CKD EPI equation.                          This calculation has not been validated in all clinical situations.                          eGFR's persistently <90 mL/min signify possible Chronic Kidney                          Disease.  Appointment on 01/21/2013  Component Date Value Range Status  . WBC 01/21/2013 4.4  4.0 - 10.5 K/uL Final  . RBC 01/21/2013 3.85* 4.22 - 5.81 MIL/uL Final  . Hemoglobin 01/21/2013 12.0* 13.0 - 17.0 g/dL Final  . HCT 01/21/2013 36.0* 39.0 - 52.0 % Final  . MCV 01/21/2013 93.5  78.0 - 100.0 fL Final  . MCH 01/21/2013 31.2  26.0 - 34.0 pg Final  . MCHC 01/21/2013 33.3  30.0 - 36.0 g/dL Final  . RDW 01/21/2013 14.2  11.5 - 15.5 % Final  . Platelets 01/21/2013 177  150 - 400 K/uL Final  . Neutrophils Relative % 01/21/2013 74  43 - 77 % Final  . Neutro Abs 01/21/2013 3.2  1.7 - 7.7 K/uL Final  . Lymphocytes Relative 01/21/2013 13  12 - 46 % Final  . Lymphs Abs 01/21/2013 0.6* 0.7 - 4.0 K/uL Final  . Monocytes Relative 01/21/2013 7  3 - 12 % Final  . Monocytes Absolute 01/21/2013 0.3  0.1 - 1.0 K/uL Final    . Eosinophils Relative 01/21/2013 6* 0 - 5 % Final  . Eosinophils Absolute 01/21/2013 0.3  0.0 - 0.7 K/uL Final  . Basophils Relative 01/21/2013 1  0 - 1 % Final  . Basophils Absolute 01/21/2013 0.0  0.0 - 0.1 K/uL Final  . Sodium 01/21/2013 139  137 - 147 mEq/L Final  . Potassium 01/21/2013 4.2  3.7 - 5.3 mEq/L Final  . Chloride 01/21/2013 103  96 - 112 mEq/L Final  . CO2 01/21/2013 26  19 - 32 mEq/L Final  . Glucose, Bld 01/21/2013 99  70 - 99 mg/dL Final  . BUN 01/21/2013 22  6 - 23 mg/dL Final  . Creatinine, Ser 01/21/2013 1.45* 0.50 - 1.35 mg/dL Final  . Calcium 01/21/2013 9.0  8.4 - 10.5 mg/dL Final  . Total Protein 01/21/2013 7.2  6.0 - 8.3 g/dL Final  . Albumin 01/21/2013 3.6  3.5 - 5.2 g/dL Final  . AST 01/21/2013 13  0 - 37 U/L Final  . ALT 01/21/2013 8  0 - 53 U/L Final  . Alkaline Phosphatase 01/21/2013 63  39 - 117 U/L Final  . Total Bilirubin 01/21/2013 0.5  0.3 - 1.2 mg/dL Final  . GFR calc non Af Amer 01/21/2013 45* >90 mL/min Final  . GFR calc Af Amer 01/21/2013 52* >90 mL/min Final   Comment: (NOTE)  The eGFR has been calculated using the CKD EPI equation.                          This calculation has not been validated in all clinical situations.                          eGFR's persistently <90 mL/min signify possible Chronic Kidney                          Disease.    PATHOLOGY: No new pathology.  Urinalysis    Component Value Date/Time   COLORURINE YELLOW 01/24/2012 1535   APPEARANCEUR CLEAR 01/24/2012 1535   LABSPEC 1.015 01/24/2012 1535   PHURINE 5.5 01/24/2012 [place] 01/24/2012 1535   HGBUR NEGATIVE 01/24/2012 1535   BILIRUBINUR NEGATIVE 01/24/2012 1535   KETONESUR NEGATIVE 01/24/2012 1535   PROTEINUR TRACE* 01/24/2012 1535   UROBILINOGEN 0.2 01/24/2012 1535   NITRITE NEGATIVE 01/24/2012 1535   LEUKOCYTESUR NEGATIVE 01/24/2012 1535    RADIOGRAPHIC STUDIES: Ct Chest Wo Contrast  02/11/2013   CLINICAL DATA:  F/U LUNG CA WITH  LARYNGEAL CA, HX TRACHEOSTOMY 2010, ONGOING CHEMO PILLS, PT HAS NO COMPLAINTS AT THIS TIME.  EXAM: CT CHEST WITHOUT CONTRAST  TECHNIQUE: Multidetector CT imaging of the chest was performed following the standard protocol without IV contrast.  COMPARISON:  11/26/2012  FINDINGS: Evaluation of thoracic inlet demonstrates changes consistent with prior tracheostomy. Secretions are again identified within the dependent portions of the trachea.  Multichamber cardiac enlargement is appreciated. Stable small subcentimeter mediastinal lymph nodes are again identified.  A partially spiculated pleural-based nodules appreciated within the apex of the right upper lobe and just image 7 series 3 measuring 1.2 x 1 point cm in AP by transverse dimensions compared to previous study these measurements are stable. A second cluster appearing area of nodular densities project along the periphery of the lateral segment right middle lobe image 37 series 3 the largest focus measures 0.8 x 1.2 cm. This larger focus has a more ground-glass appearance and has slight increased conspicuity when compared to the previous study.  There is diffuse prominence of the interstitial markings with greatest confluence of the lung apices. No focal regions of consolidation or focal infiltrates appreciated.  The central airways are patent. A stable small left pleural effusion is identified.  There is no evidence of a thoracic aortic aneurysm. Atherosclerotic calcifications identified within the coronary vessels.  Postsurgical changes identified within the posterior lateral aspect of the ribs on the left.  Noncontrast evaluation of the visualized upper abdominal viscera stable. Hydronephrosis is again identified, partially, within the left kidney. Atherosclerotic calcifications identified within the abdominal aorta.  IMPRESSION: 1. Stable pleural based right apical pulmonary nodule 2. Slightly increased conspicuity of the clustered nodules in the right middle  lobe region, this finding may reflect fibrotic change still considering patient's history more ominous etiologies are of diagnostic consideration. Revaluation and 2-4 months is recommended, with non contrasted CT. 3. Stable interstitial fibrotic changes throughout the lungs 4. No new focal regions of consolidation or new focal infiltrates. 5. Stable small subcentimeter mediastinal lymph nodes 6. Overall stable disease within the lungs mediastinum when compared to previous study.   Electronically Signed   By: Margaree Mackintosh M.D.   On: 02/11/2013 12:22    ASSESSMENT:  #1.Metastatic squamous cell malignancy in the lung either representing  lung cancer with lung metastases or larynx cancer with lung metastases, squamous cell histology, currently on Tarceva 150 mg daily after 6 cycles of carboplatin and Taxol, tolerating well.  #2. Cardiomyopathy, stable, no symptoms of heart failure or dysrhythmia. #3. Mild leukopenia secondary to previous chemotherapy.    PLAN:  #1. Continue Tarceva 150 mg daily. #2. Office visit 1 month with CBC, LDH, and CMP.   All questions were answered. The patient knows to call the clinic with any problems, questions or concerns. We can certainly see the patient much sooner if necessary.   I spent 25 minutes counseling the patient face to face. The total time spent in the appointment was 30 minutes.    Doroteo Bradford, MD 02/18/2013 12:03 PM

## 2013-02-19 ENCOUNTER — Other Ambulatory Visit (HOSPITAL_COMMUNITY): Payer: Self-pay | Admitting: Oncology

## 2013-02-19 DIAGNOSIS — C799 Secondary malignant neoplasm of unspecified site: Secondary | ICD-10-CM

## 2013-02-19 MED ORDER — ERLOTINIB HCL 150 MG PO TABS: 150.0000 mg | ORAL_TABLET | Freq: Every day | ORAL | Status: DC

## 2013-03-04 ENCOUNTER — Encounter (HOSPITAL_BASED_OUTPATIENT_CLINIC_OR_DEPARTMENT_OTHER): Payer: Managed Care, Other (non HMO)

## 2013-03-04 DIAGNOSIS — C801 Malignant (primary) neoplasm, unspecified: Secondary | ICD-10-CM

## 2013-03-04 DIAGNOSIS — Z452 Encounter for adjustment and management of vascular access device: Secondary | ICD-10-CM

## 2013-03-04 DIAGNOSIS — Z95828 Presence of other vascular implants and grafts: Secondary | ICD-10-CM

## 2013-03-04 DIAGNOSIS — C7800 Secondary malignant neoplasm of unspecified lung: Secondary | ICD-10-CM

## 2013-03-04 MED ORDER — SODIUM CHLORIDE 0.9 % IJ SOLN
10.0000 mL | INTRAMUSCULAR | Status: DC | PRN
  Administered 2013-03-04: 10 mL via INTRAVENOUS

## 2013-03-04 MED ORDER — HEPARIN SOD (PORK) LOCK FLUSH 100 UNIT/ML IV SOLN
500.0000 [IU] | Freq: Once | INTRAVENOUS | Status: AC
  Administered 2013-03-04: 500 [IU] via INTRAVENOUS
  Filled 2013-03-04: qty 5

## 2013-03-04 NOTE — Progress Notes (Signed)
Jeffrey [name] presented for Portacath access and flush. Proper placement of portacath confirmed by CXR. Portacath located rt  chest wall accessed with  H 20 needle. Good blood return present. Portacath flushed with 52ml NS and 500U/29ml Heparin and needle removed intact. Procedure without incident. Patient tolerated procedure well.

## 2013-03-18 ENCOUNTER — Encounter (HOSPITAL_COMMUNITY): Payer: Self-pay

## 2013-03-18 ENCOUNTER — Encounter (HOSPITAL_BASED_OUTPATIENT_CLINIC_OR_DEPARTMENT_OTHER): Payer: Managed Care, Other (non HMO)

## 2013-03-18 ENCOUNTER — Encounter (HOSPITAL_COMMUNITY): Payer: Managed Care, Other (non HMO) | Attending: Oncology

## 2013-03-18 VITALS — BP 131/82 | HR 76 | Temp 97.9°F | Resp 18 | Wt 136.2 lb

## 2013-03-18 DIAGNOSIS — C7800 Secondary malignant neoplasm of unspecified lung: Secondary | ICD-10-CM

## 2013-03-18 DIAGNOSIS — R918 Other nonspecific abnormal finding of lung field: Secondary | ICD-10-CM | POA: Insufficient documentation

## 2013-03-18 DIAGNOSIS — D72819 Decreased white blood cell count, unspecified: Secondary | ICD-10-CM

## 2013-03-18 DIAGNOSIS — C799 Secondary malignant neoplasm of unspecified site: Secondary | ICD-10-CM

## 2013-03-18 DIAGNOSIS — C801 Malignant (primary) neoplasm, unspecified: Secondary | ICD-10-CM

## 2013-03-18 DIAGNOSIS — C329 Malignant neoplasm of larynx, unspecified: Secondary | ICD-10-CM | POA: Insufficient documentation

## 2013-03-18 DIAGNOSIS — C3490 Malignant neoplasm of unspecified part of unspecified bronchus or lung: Secondary | ICD-10-CM | POA: Insufficient documentation

## 2013-03-18 LAB — CBC WITH DIFFERENTIAL/PLATELET
BASOS PCT: 2 % — AB (ref 0–1)
Basophils Absolute: 0.1 10*3/uL (ref 0.0–0.1)
Eosinophils Absolute: 0.3 10*3/uL (ref 0.0–0.7)
Eosinophils Relative: 6 % — ABNORMAL HIGH (ref 0–5)
HEMATOCRIT: 39.7 % (ref 39.0–52.0)
HEMOGLOBIN: 13.2 g/dL (ref 13.0–17.0)
LYMPHS PCT: 16 % (ref 12–46)
Lymphs Abs: 0.8 10*3/uL (ref 0.7–4.0)
MCH: 30.6 pg (ref 26.0–34.0)
MCHC: 33.2 g/dL (ref 30.0–36.0)
MCV: 91.9 fL (ref 78.0–100.0)
MONO ABS: 0.4 10*3/uL (ref 0.1–1.0)
MONOS PCT: 7 % (ref 3–12)
NEUTROS ABS: 3.6 10*3/uL (ref 1.7–7.7)
NEUTROS PCT: 70 % (ref 43–77)
Platelets: 173 10*3/uL (ref 150–400)
RBC: 4.32 MIL/uL (ref 4.22–5.81)
RDW: 13.9 % (ref 11.5–15.5)
WBC: 5.1 10*3/uL (ref 4.0–10.5)

## 2013-03-18 LAB — COMPREHENSIVE METABOLIC PANEL
ALBUMIN: 3.7 g/dL (ref 3.5–5.2)
ALK PHOS: 72 U/L (ref 39–117)
ALT: 9 U/L (ref 0–53)
AST: 15 U/L (ref 0–37)
BUN: 27 mg/dL — AB (ref 6–23)
CO2: 26 meq/L (ref 19–32)
Calcium: 9.3 mg/dL (ref 8.4–10.5)
Chloride: 101 mEq/L (ref 96–112)
Creatinine, Ser: 1.73 mg/dL — ABNORMAL HIGH (ref 0.50–1.35)
GFR calc Af Amer: 42 mL/min — ABNORMAL LOW (ref >90)
GFR, EST NON AFRICAN AMERICAN: 36 mL/min — AB (ref >90)
Glucose, Bld: 103 mg/dL — ABNORMAL HIGH (ref 70–99)
POTASSIUM: 4.8 meq/L (ref 3.7–5.3)
Sodium: 137 mEq/L (ref 137–147)
Total Bilirubin: 0.4 mg/dL (ref 0.3–1.2)
Total Protein: 7.8 g/dL (ref 6.0–8.3)

## 2013-03-18 LAB — LACTATE DEHYDROGENASE: LDH: 166 U/L (ref 94–250)

## 2013-03-18 NOTE — Progress Notes (Signed)
[facility]  OFFICE PROGRESS NOTE  [name], MD [place] [address]  DIAGNOSIS: Lung cancer - Plan: CEA, CT Abdomen Pelvis W Contrast, CT Chest W Contrast  Larynx cancer - Plan: CEA  Chief Complaint  Patient presents with  . Squamous cell lung cancer on maintenance therapy    Tarceva 150 mg daily    CURRENT THERAPY: Tarceva 150 mg daily.  INTERVAL HISTORY: Jeffrey [name] 80 y.o. male returns for followup while taking Tarceva 150 mg daily started in November 2014 as maintenance therapy after 6 cycles of carboplatin and Taxol ending on 11/12/2012 for squamous cell carcinoma of the lung. Last CT done 02/11/2013 showed stable disease. He continues to do well with no nausea, vomiting, cough, wheezing, tracheal bleeding or ambulance, lower extremity swelling or redness, chest pain, PND, orthopnea, palpitations, skin rash, diarrhea, melena, hematochezia, hematuria, skin rash, headache, or seizures.  MEDICAL HISTORY: Past Medical History  Diagnosis Date  . Tracheostomy in place   . Cancer 2010    laryngeal  . Lung cancer 2010  . Pneumonia 2014  . Dysrhythmia     takes Coreg and Lisinopril daily  . Shortness of breath     with exertion  . Enlarged prostate     takes flomax  . Urinary urgency   . Gastric ulcer     hx of  . Hx of transfusion of packed red blood cells     no abnormal reaction noted by patient  . Umbilical hernia   . Metastatic cancer 07/24/2012  . Hyperlipidemia   . ED (erectile dysfunction)   . Insomnia   . CHF (congestive heart failure)     INTERIM HISTORY: has TOBACCO ABUSE; EAR PAIN, LEFT; C O P D; NECK PAIN, LEFT; POSTNASAL DRIP SYNDROME; UNSPECIFIED RESPIRATORY ABNORMALITY; History of laryngeal cancer; History of lung cancer; Tracheostomy in place; Elevated brain natriuretic peptide (BNP) level; Pulmonary nodules; Bilateral hydronephrosis; Cardiomyopathy; Metastatic cancer; Prostate  hypertrophy; and Insomnia on his problem list.   Tarceva 150 mg daily preceded by 6 cycles of carboplatin/Taxol:  Chemotherapy  07/31/2012  08/20/2012  09/10/2012   Day, Cycle  Day 1, Cycle 1  Day 1, Cycle 2  Day 1, Cycle 3   CARBOplatin (PARAPLATIN) IV  330 mg  330 mg  330 mg   PACLitaxel (TAXOL) IV  175 mg/m2  175 mg/m2  175 mg/m2    Chemotherapy  10/01/2012  10/22/2012  11/12/2012   Day, Cycle  Day 1, Cycle 4  Day 1, Cycle 5  Day 1, Cycle 6   CARBOplatin (PARAPLATIN) IV  330 mg  330 mg  330 mg   PACLitaxel (TAXOL) IV  175 mg/m2  175 mg/m2  175 mg/m2      ALLERGIES:  has no active allergies.  MEDICATIONS: has a current medication list which includes the following prescription(s): aspirin, bisacodyl, carvedilol, erlotinib, esomeprazole, hydrocodone-acetaminophen, lisinopril, lorazepam, ondansetron, tamsulosin, lidocaine-prilocaine, and naproxen sodium.  SURGICAL HISTORY:  Past Surgical History  Procedure Laterality Date  . Tracheostomy  2010  . Lung removal, partial  2010    left side  . Layngectomy for laryngeal cancer in2010; left upper lobectomy for lung cancer, 2010.    . Back surgery      thinks it was done in 2012  . Esophagogastroduodenoscopy    . Colonoscopy    . Portacath placement Bilateral 07/12/2012    Procedure: INSERTION PORT-A-CATH;  Surgeon: Collier [place]ina  Prescott Gum, MD;  Location: [facility] OR;  Service: Thoracic;  Laterality: Bilateral;    FAMILY HISTORY: family history includes Diabetes in his father; Heart disease in his father.  SOCIAL HISTORY:  reports that he has been smoking Cigarettes.  He has been smoking about 1.00 pack per day. He has never used smokeless tobacco. He reports that he does not drink alcohol or use illicit drugs.  REVIEW OF SYSTEMS:  Other than that discussed above is noncontributory.  PHYSICAL EXAMINATION: ECOG PERFORMANCE STATUS: 0 - Asymptomatic  Blood pressure 131/82, pulse 76, temperature 97.9 F (36.6 C), temperature source Oral, resp. rate 18,  weight 136 lb 3.2 oz (61.78 kg), SpO2 97.00%.  GENERAL:alert, no distress and comfortable SKIN: skin color, texture, turgor are normal, no rashes or significant lesions EYES:; left calcified cataract.. Right pupil reactive to light. OROPHARYNX:no exudate, no erythema on lips, buccal mucosa, or tongue. NECK: supple, thyroid normal size, non-tender, without nodularity. No masses. Tracheostomy in place with no evidence of purulence or hemorrhage. CHEST: Increased AP diameter with light port in place. LYMPH:  no palpable lymphadenopathy in the cervical, axillary or inguinal LUNGS: clear to auscultation and percussion with normal breathing effort HEART: regular rate & rhythm and no murmurs. ABDOMEN:abdomen soft, non-tender and normal bowel sounds MUSCULOSKELETAL:no cyanosis of digits and no clubbing. Range of motion normal.  NEURO: alert & oriented x 3 with fluent speech, no focal motor/sensory deficits   LABORATORY DATA: Infusion on 03/18/2013  Component Date Value Ref Range Status  . WBC 03/18/2013 5.1  4.0 - 10.5 K/uL Final  . RBC 03/18/2013 4.32  4.22 - 5.81 MIL/uL Final  . Hemoglobin 03/18/2013 13.2  13.0 - 17.0 g/dL Final  . HCT 03/18/2013 39.7  39.0 - 52.0 % Final  . MCV 03/18/2013 91.9  78.0 - 100.0 fL Final  . MCH 03/18/2013 30.6  26.0 - 34.0 pg Final  . MCHC 03/18/2013 33.2  30.0 - 36.0 g/dL Final  . RDW 03/18/2013 13.9  11.5 - 15.5 % Final  . Platelets 03/18/2013 173  150 - 400 K/uL Final  . Neutrophils Relative % 03/18/2013 70  43 - 77 % Final  . Neutro Abs 03/18/2013 3.6  1.7 - 7.7 K/uL Final  . Lymphocytes Relative 03/18/2013 16  12 - 46 % Final  . Lymphs Abs 03/18/2013 0.8  0.7 - 4.0 K/uL Final  . Monocytes Relative 03/18/2013 7  3 - 12 % Final  . Monocytes Absolute 03/18/2013 0.4  0.1 - 1.0 K/uL Final  . Eosinophils Relative 03/18/2013 6* 0 - 5 % Final  . Eosinophils Absolute 03/18/2013 0.3  0.0 - 0.7 K/uL Final  . Basophils Relative 03/18/2013 2* 0 - 1 % Final  .  Basophils Absolute 03/18/2013 0.1  0.0 - 0.1 K/uL Final  . Sodium 03/18/2013 137  137 - 147 mEq/L Final  . Potassium 03/18/2013 4.8  3.7 - 5.3 mEq/L Final  . Chloride 03/18/2013 101  96 - 112 mEq/L Final  . CO2 03/18/2013 26  19 - 32 mEq/L Final  . Glucose, Bld 03/18/2013 103* 70 - 99 mg/dL Final  . BUN 03/18/2013 27* 6 - 23 mg/dL Final  . Creatinine, Ser 03/18/2013 1.73* 0.50 - 1.35 mg/dL Final  . Calcium 03/18/2013 9.3  8.4 - 10.5 mg/dL Final  . Total Protein 03/18/2013 7.8  6.0 - 8.3 g/dL Final  . Albumin 03/18/2013 3.7  3.5 - 5.2 g/dL Final  . AST 03/18/2013 15  0 - 37 U/L Final  .  ALT 03/18/2013 9  0 - 53 U/L Final  . Alkaline Phosphatase 03/18/2013 72  39 - 117 U/L Final  . Total Bilirubin 03/18/2013 0.4  0.3 - 1.2 mg/dL Final  . GFR calc non Af Amer 03/18/2013 36* >90 mL/min Final  . GFR calc Af Amer 03/18/2013 42* >90 mL/min Final   Comment: (NOTE)                          The eGFR has been calculated using the CKD EPI equation.                          This calculation has not been validated in all clinical situations.                          eGFR's persistently <90 mL/min signify possible Chronic Kidney                          Disease.  [name] Kitchen LDH 03/18/2013 166  94 - 250 U/L Final  Infusion on 02/18/2013  Component Date Value Ref Range Status  . WBC 02/18/2013 3.8* 4.0 - 10.5 K/uL Final  . RBC 02/18/2013 4.15* 4.22 - 5.81 MIL/uL Final  . Hemoglobin 02/18/2013 12.8* 13.0 - 17.0 g/dL Final  . HCT 02/18/2013 38.4* 39.0 - 52.0 % Final  . MCV 02/18/2013 92.5  78.0 - 100.0 fL Final  . MCH 02/18/2013 30.8  26.0 - 34.0 pg Final  . MCHC 02/18/2013 33.3  30.0 - 36.0 g/dL Final  . RDW 02/18/2013 13.9  11.5 - 15.5 % Final  . Platelets 02/18/2013 174  150 - 400 K/uL Final  . Neutrophils Relative % 02/18/2013 70  43 - 77 % Final  . Neutro Abs 02/18/2013 2.7  1.7 - 7.7 K/uL Final  . Lymphocytes Relative 02/18/2013 16  12 - 46 % Final  . Lymphs Abs 02/18/2013 0.6* 0.7 - 4.0 K/uL Final    . Monocytes Relative 02/18/2013 8  3 - 12 % Final  . Monocytes Absolute 02/18/2013 0.3  0.1 - 1.0 K/uL Final  . Eosinophils Relative 02/18/2013 6* 0 - 5 % Final  . Eosinophils Absolute 02/18/2013 0.2  0.0 - 0.7 K/uL Final  . Basophils Relative 02/18/2013 1  0 - 1 % Final  . Basophils Absolute 02/18/2013 0.0  0.0 - 0.1 K/uL Final  . Sodium 02/18/2013 140  137 - 147 mEq/L Final  . Potassium 02/18/2013 4.1  3.7 - 5.3 mEq/L Final  . Chloride 02/18/2013 104  96 - 112 mEq/L Final  . CO2 02/18/2013 25  19 - 32 mEq/L Final  . Glucose, Bld 02/18/2013 97  70 - 99 mg/dL Final  . BUN 02/18/2013 20  6 - 23 mg/dL Final  . Creatinine, Ser 02/18/2013 1.45* 0.50 - 1.35 mg/dL Final  . Calcium 02/18/2013 8.7  8.4 - 10.5 mg/dL Final  . Total Protein 02/18/2013 7.2  6.0 - 8.3 g/dL Final  . Albumin 02/18/2013 3.4* 3.5 - 5.2 g/dL Final  . AST 02/18/2013 16  0 - 37 U/L Final  . ALT 02/18/2013 11  0 - 53 U/L Final  . Alkaline Phosphatase 02/18/2013 68  39 - 117 U/L Final  . Total Bilirubin 02/18/2013 0.4  0.3 - 1.2 mg/dL Final  . GFR calc non Af Amer 02/18/2013 45* >90 mL/min Final  . GFR calc  Af Amer 02/18/2013 52* >90 mL/min Final   Comment: (NOTE)                          The eGFR has been calculated using the CKD EPI equation.                          This calculation has not been validated in all clinical situations.                          eGFR's persistently <90 mL/min signify possible Chronic Kidney                          Disease.    PATHOLOGY: No new pathology.  Urinalysis    Component Value Date/Time   COLORURINE YELLOW 01/24/2012 1535   APPEARANCEUR CLEAR 01/24/2012 1535   LABSPEC 1.015 01/24/2012 1535   PHURINE 5.5 01/24/2012 [place] 01/24/2012 1535   HGBUR NEGATIVE 01/24/2012 1535   BILIRUBINUR NEGATIVE 01/24/2012 1535   KETONESUR NEGATIVE 01/24/2012 1535   PROTEINUR TRACE* 01/24/2012 1535   UROBILINOGEN 0.2 01/24/2012 1535   NITRITE NEGATIVE 01/24/2012 1535   LEUKOCYTESUR NEGATIVE  01/24/2012 1535    RADIOGRAPHIC STUDIES: CT Chest Wo Contrast Status: Final result         PACS Images    Show images for CT Chest Wo Contrast         Study Result    CLINICAL DATA: F/U LUNG CA WITH LARYNGEAL CA, HX TRACHEOSTOMY 2010,  ONGOING CHEMO PILLS, PT HAS NO COMPLAINTS AT THIS TIME.  EXAM:  CT CHEST WITHOUT CONTRAST  TECHNIQUE:  Multidetector CT imaging of the chest was performed following the  standard protocol without IV contrast.  COMPARISON: 11/26/2012  FINDINGS:  Evaluation of thoracic inlet demonstrates changes consistent with  prior tracheostomy. Secretions are again identified within the  dependent portions of the trachea.  Multichamber cardiac enlargement is appreciated. Stable small  subcentimeter mediastinal lymph nodes are again identified.  A partially spiculated pleural-based nodules appreciated within the  apex of the right upper lobe and just image 7 series 3 measuring 1.2  x 1 point cm in AP by transverse dimensions compared to previous  study these measurements are stable. A second cluster appearing area  of nodular densities project along the periphery of the lateral  segment right middle lobe image 37 series 3 the largest focus  measures 0.8 x 1.2 cm. This larger focus has a more ground-glass  appearance and has slight increased conspicuity when compared to the  previous study.  There is diffuse prominence of the interstitial markings with  greatest confluence of the lung apices. No focal regions of  consolidation or focal infiltrates appreciated.  The central airways are patent. A stable small left pleural effusion  is identified.  There is no evidence of a thoracic aortic aneurysm. Atherosclerotic  calcifications identified within the coronary vessels.  Postsurgical changes identified within the posterior lateral aspect  of the ribs on the left.  Noncontrast evaluation of the visualized upper abdominal viscera  stable. Hydronephrosis is  again identified, partially, within the  left kidney. Atherosclerotic calcifications identified within the  abdominal aorta.  IMPRESSION:  1. Stable pleural based right apical pulmonary nodule  2. Slightly increased conspicuity of the clustered nodules in the  right middle lobe region, this finding may  reflect fibrotic change  still considering patient's history more ominous etiologies are of  diagnostic consideration. Revaluation and 2-4 months is recommended,  with non contrasted CT.  3. Stable interstitial fibrotic changes throughout the lungs  4. No new focal regions of consolidation or new focal infiltrates.  5. Stable small subcentimeter mediastinal lymph nodes  6. Overall stable disease within the lungs mediastinum when compared  to previous study.  Electronically Signed  By: Margaree Mackintosh M.D.  On: 02/11/2013 12:     ASSESSMENT:  #1.Metastatic squamous cell malignancy in the lung either representing lung cancer with lung metastases or larynx cancer with lung metastases, squamous cell histology, currently on Tarceva 150 mg daily after 6 cycles of carboplatin and Taxol, tolerating well.  #2. Cardiomyopathy, stable, no symptoms of heart failure or dysrhythmia.  #3. Mild leukopenia secondary to previous chemotherapy.    PLAN:  #1. Continue Tarceva 150 mg daily. #2. Followup in one month with CBC and Chem  profile. Plan CT scan in mid April 2015   All questions were answered. The patient knows to call the clinic with any problems, questions or concerns. We can certainly see the patient much sooner if necessary.   I spent 25 minutes counseling the patient face to face. The total time spent in the appointment was 30 minutes.    Doroteo Bradford, MD 03/18/2013 12:54 PM

## 2013-03-18 NOTE — Progress Notes (Signed)
Jeffrey [name] presented for labwork. Labs per MD order drawn via Peripheral Line 23 gauge needle inserted in right AC.  Good blood return present. Procedure without incident.  Needle removed intact. Patient tolerated procedure well.

## 2013-03-18 NOTE — Patient Instructions (Addendum)
[facility] Discharge Instructions  RECOMMENDATIONS MADE BY THE CONSULTANT AND ANY TEST RESULTS WILL BE SENT TO YOUR REFERRING PHYSICIAN Follow up with the [facility] in 4 weeks Will have lab work at that time.  Ct scan on April 15th at 8:45, be here at 8:30 to register.  Thank you for choosing [facility] to provide your oncology and hematology care.  To afford each patient quality time with our providers, please arrive at least 15 minutes before your scheduled appointment time.  With your help, our goal is to use those 15 minutes to complete the necessary work-up to ensure our physicians have the information they need to help with your evaluation and healthcare recommendations.    Effective January 1st, 2014, we ask that you re-schedule your appointment with our physicians should you arrive 10 or more minutes late for your appointment.  We strive to give you quality time with our providers, and arriving late affects you and other patients whose appointments are after yours.    Again, thank you for choosing [facility].  Our hope is that these requests will decrease the amount of time that you wait before being seen by our physicians.       _____________________________________________________________  Should you have questions after your visit to [facility], please contact our office at [phone] between the hours of 8:30 a.m. and 5:00 p.m.  Voicemails left after 4:30 p.m. will not be returned until the following business day.  For prescription refill requests, have your pharmacy contact our office with your prescription refill request.

## 2013-03-24 ENCOUNTER — Other Ambulatory Visit: Payer: Self-pay | Admitting: Family Medicine

## 2013-03-25 NOTE — Telephone Encounter (Signed)
Ok plus 5 ref 

## 2013-04-09 ENCOUNTER — Other Ambulatory Visit (HOSPITAL_COMMUNITY): Payer: Self-pay | Admitting: Oncology

## 2013-04-09 DIAGNOSIS — C799 Secondary malignant neoplasm of unspecified site: Secondary | ICD-10-CM

## 2013-04-09 MED ORDER — ERLOTINIB HCL 150 MG PO TABS: 150.0000 mg | ORAL_TABLET | Freq: Every day | ORAL | Status: DC

## 2013-04-17 NOTE — Progress Notes (Signed)
[name], MD [name] [address]  Metastatic cancer - Plan: heparin lock flush 100 unit/mL, sodium chloride 0.9 % injection 10 mL  URI (upper respiratory infection) - Plan: azithromycin (ZITHROMAX) 250 MG tablet  CURRENT THERAPY: Tarceva 150 mg daily.  INTERVAL HISTORY: Jeffrey [name] 80 y.o. male returns for  regular  visit for followup of Metastatic squamous cell malignancy in the lung either representing lung cancer with lung metastases or larynx cancer with lung metastases, squamous cell histology, currently on Tarceva 150 mg daily after 6 cycles of carboplatin and Taxol, tolerating well.     Metastatic cancer   02/17/2011 Imaging PET scan- B/L hypermetabolic pulmonary nodules (3 in total) are most consistent with metastatic disease. No evidence of mediastinal nodal metastasis.  No evidence of metastasis in the abdomen or pelvis.   06/19/2012 Imaging CT scan of chest- Slight enlargement of pulmonary nodules/metastasis.    07/31/2012 - 11/12/2012 Chemotherapy Carboplatin/Paclitaxel x 6 cycles   11/26/2012 Imaging CT CAP- Similar size of a R apical pulm nodule.  Similar to improved left lower lobe nodularity, favored to be post infectious or inflammatory. Similar nodularity in the right middle lobe. No evidence of new or progressive disease. Similar borderline adeno    12/10/2012 -  Chemotherapy Tarceva 150 mg daily    I personally reviewed and went over laboratory results with the patient.  The results are noted within this dictation.  He has CT scans scheduled in 2 weeks for restaging and this will provide [place] the information required to evaluate for progression of diease.  Of course, we are hoping for disease stability.  He reports low grade fevers never over 100.8 F.  He admits to green/yellow sputum with a cough.  He thinks it is from pollen.  He denies feeling poorly.  However, with his colored sputum production, and upcoming CT scans, it is reasonable  to provide an antibiotic.  He denies any allergies to medications that he is aware of.   Oncologically, he denies any complaints and ROS questioning is negative.   Past Medical History  Diagnosis Date  . Tracheostomy in place   . Cancer 2010    laryngeal  . Lung cancer 2010  . Pneumonia 2014  . Dysrhythmia     takes Coreg and Lisinopril daily  . Shortness of breath     with exertion  . Enlarged prostate     takes flomax  . Urinary urgency   . Gastric ulcer     hx of  . Hx of transfusion of packed red blood cells     no abnormal reaction noted by patient  . Umbilical hernia   . Metastatic cancer 07/24/2012  . Hyperlipidemia   . ED (erectile dysfunction)   . Insomnia   . CHF (congestive heart failure)     has TOBACCO ABUSE; EAR PAIN, LEFT; C O P D; NECK PAIN, LEFT; POSTNASAL DRIP SYNDROME; UNSPECIFIED RESPIRATORY ABNORMALITY; History of laryngeal cancer; History of lung cancer; Tracheostomy in place; Elevated brain natriuretic peptide (BNP) level; Pulmonary nodules; Bilateral hydronephrosis; Cardiomyopathy; Metastatic cancer; Prostate hypertrophy; and Insomnia on his problem list.     has No Known Allergies.  Jeffrey [name] had no medications administered during this visit.  Past Surgical History  Procedure Laterality Date  . Tracheostomy  2010  . Lung removal, partial  2010    left side  . Layngectomy for laryngeal cancer in2010; left upper lobectomy for lung cancer, 2010.    [name] Kitchen  Back surgery      thinks it was done in 2012  . Esophagogastroduodenoscopy    . Colonoscopy    . Portacath placement Bilateral 07/12/2012    Procedure: INSERTION PORT-A-CATH;  Surgeon: [name], MD;  Location: [facility] OR;  Service: Thoracic;  Laterality: Bilateral;    Denies any headaches, dizziness, double vision, fevers, chills, night sweats, nausea, vomiting, diarrhea, constipation, chest pain, heart palpitations, shortness of breath, blood in stool, black tarry stool, urinary pain, urinary  burning, urinary frequency, hematuria.   PHYSICAL EXAMINATION  ECOG PERFORMANCE STATUS: 0 - Asymptomatic  Filed Vitals:   04/18/13 0941  BP: 93/62  Pulse: 79  Temp: 97.8 F (36.6 C)  Resp: 18    GENERAL:alert, no distress, well nourished, well developed, comfortable, cooperative and smiling SKIN: skin color, texture, turgor are normal, no rashes or significant lesions HEAD: Normocephalic, No masses, lesions, tenderness or abnormalities EYES: normal, PERRLA, EOMI, Conjunctiva are pink and non-injected EARS: External ears normal OROPHARYNX:mucous membranes are moist  NECK: supple, no adenopathy, thyroid normal size, non-tender, without nodularity, no stridor, non-tender, trachea midline LYMPH:  no palpable lymphadenopathy BREAST:not examined LUNGS: clear to auscultation  HEART: regular rate & rhythm, no murmurs, no gallops, S1 normal and S2 normal ABDOMEN:abdomen soft and normal bowel sounds BACK: Back symmetric, no curvature. EXTREMITIES:less then 2 second capillary refill, no joint deformities, effusion, or inflammation, no edema, no skin discoloration, no clubbing, no cyanosis  NEURO: alert & oriented x 3 with fluent speech, no focal motor/sensory deficits, gait normal   LABORATORY DATA: CBC    Component Value Date/Time   WBC 4.3 04/18/2013 0905   RBC 3.87* 04/18/2013 0905   HGB 11.8* 04/18/2013 0905   HCT 35.4* 04/18/2013 0905   PLT 209 04/18/2013 0905   MCV 91.5 04/18/2013 0905   MCH 30.5 04/18/2013 0905   MCHC 33.3 04/18/2013 0905   RDW 14.2 04/18/2013 0905   LYMPHSABS 0.5* 04/18/2013 0905   MONOABS 0.4 04/18/2013 0905   EOSABS 0.2 04/18/2013 0905   BASOSABS 0.0 04/18/2013 0905      Chemistry      Component Value Date/Time   NA 136* 04/18/2013 0905   K 4.7 04/18/2013 0905   CL 98 04/18/2013 0905   CO2 28 04/18/2013 0905   BUN 20 04/18/2013 0905   CREATININE 1.55* 04/18/2013 0905   CREATININE 1.46* 11/12/2012 0839      Component Value Date/Time   CALCIUM 9.0 04/18/2013 0905   CALCIUM 8.5  01/26/2012 1312   ALKPHOS 72 04/18/2013 0905   AST 19 04/18/2013 0905   ALT 14 04/18/2013 0905   BILITOT 0.5 04/18/2013 0905     Results for Jeffrey [name], Jeffrey [name] (MRN [id number]) as of 04/17/2013 08:51  Ref. Range 03/18/2013 09:39  LDH Latest Range: 94-250 U/L 166     ASSESSMENT:  1. Metastatic squamous cell malignancy in the lung either representing lung cancer with lung metastases or larynx cancer with lung metastases, squamous cell histology, currently on Tarceva 150 mg daily after 6 cycles of carboplatin and Taxol, tolerating well.  2. Cardiomyopathy, stable, no symptoms of heart failure or dysrhythmia.  3. Yellow/green sputum production with low grade fever.  Will treat with antibiotic. ?URI?  Patient Active Problem List   Diagnosis Date Noted  . Prostate hypertrophy 11/21/2012  . Insomnia 11/21/2012  . Metastatic cancer 07/24/2012  . Cardiomyopathy 01/27/2012  . History of laryngeal cancer 01/26/2012  . History of lung cancer 01/26/2012  . Tracheostomy in place 01/26/2012  .  Elevated brain natriuretic peptide (BNP) level 01/26/2012  . Pulmonary nodules 01/26/2012  . Bilateral hydronephrosis 01/26/2012  . TOBACCO ABUSE 02/28/2008  . EAR PAIN, LEFT 02/28/2008  . C O P D 02/28/2008  . NECK PAIN, LEFT 02/28/2008  . POSTNASAL DRIP SYNDROME 02/28/2008  . UNSPECIFIED RESPIRATORY ABNORMALITY 02/28/2008     PLAN:  1. I personally reviewed and went over laboratory results with the patient.  The results are noted within this dictation. 2. Labs as scheduled every 4 weeks: CBC diff, CMET, LDH. 3. Labs today including a CEA. 4. CT CAP as scheduled on 05/01/2013. 5. Continue Tarceva 150 mg daily 6. Z-Pak for possible URI 7. Port-flush today 8. Return in 3 weeks to review labs and CT scan results.   THERAPY PLAN:  Jeffrey [name] has upcoming restaging scans in 2 weeks. We will see him back following to review results.  He will continue Tarceva unless otherwise directed.  All questions were answered. The  patient knows to call the clinic with any problems, questions or concerns. We can certainly see the patient much sooner if necessary.  Patient and plan discussed with Dr. Farrel Gobble and he is in agreement with the aforementioned.    Jeffrey [name] 04/18/2013

## 2013-04-18 ENCOUNTER — Encounter (HOSPITAL_COMMUNITY): Payer: Self-pay | Admitting: Oncology

## 2013-04-18 ENCOUNTER — Encounter (HOSPITAL_COMMUNITY): Payer: Managed Care, Other (non HMO) | Attending: Oncology | Admitting: Oncology

## 2013-04-18 ENCOUNTER — Encounter (HOSPITAL_BASED_OUTPATIENT_CLINIC_OR_DEPARTMENT_OTHER): Payer: Managed Care, Other (non HMO)

## 2013-04-18 VITALS — BP 93/62 | HR 79 | Temp 97.8°F | Resp 18 | Wt 137.8 lb

## 2013-04-18 DIAGNOSIS — R509 Fever, unspecified: Secondary | ICD-10-CM

## 2013-04-18 DIAGNOSIS — C801 Malignant (primary) neoplasm, unspecified: Secondary | ICD-10-CM

## 2013-04-18 DIAGNOSIS — C7800 Secondary malignant neoplasm of unspecified lung: Secondary | ICD-10-CM

## 2013-04-18 DIAGNOSIS — R093 Abnormal sputum: Secondary | ICD-10-CM

## 2013-04-18 DIAGNOSIS — R918 Other nonspecific abnormal finding of lung field: Secondary | ICD-10-CM | POA: Insufficient documentation

## 2013-04-18 DIAGNOSIS — C799 Secondary malignant neoplasm of unspecified site: Secondary | ICD-10-CM

## 2013-04-18 DIAGNOSIS — C3490 Malignant neoplasm of unspecified part of unspecified bronchus or lung: Secondary | ICD-10-CM

## 2013-04-18 DIAGNOSIS — C329 Malignant neoplasm of larynx, unspecified: Secondary | ICD-10-CM | POA: Insufficient documentation

## 2013-04-18 DIAGNOSIS — J069 Acute upper respiratory infection, unspecified: Secondary | ICD-10-CM | POA: Insufficient documentation

## 2013-04-18 LAB — CEA: CEA: 0.9 ng/mL (ref 0.0–5.0)

## 2013-04-18 LAB — CBC WITH DIFFERENTIAL/PLATELET
BASOS PCT: 1 % (ref 0–1)
Basophils Absolute: 0 10*3/uL (ref 0.0–0.1)
EOS ABS: 0.2 10*3/uL (ref 0.0–0.7)
EOS PCT: 5 % (ref 0–5)
HCT: 35.4 % — ABNORMAL LOW (ref 39.0–52.0)
Hemoglobin: 11.8 g/dL — ABNORMAL LOW (ref 13.0–17.0)
LYMPHS ABS: 0.5 10*3/uL — AB (ref 0.7–4.0)
Lymphocytes Relative: 11 % — ABNORMAL LOW (ref 12–46)
MCH: 30.5 pg (ref 26.0–34.0)
MCHC: 33.3 g/dL (ref 30.0–36.0)
MCV: 91.5 fL (ref 78.0–100.0)
Monocytes Absolute: 0.4 10*3/uL (ref 0.1–1.0)
Monocytes Relative: 9 % (ref 3–12)
NEUTROS PCT: 74 % (ref 43–77)
Neutro Abs: 3.2 10*3/uL (ref 1.7–7.7)
Platelets: 209 10*3/uL (ref 150–400)
RBC: 3.87 MIL/uL — AB (ref 4.22–5.81)
RDW: 14.2 % (ref 11.5–15.5)
WBC: 4.3 10*3/uL (ref 4.0–10.5)

## 2013-04-18 LAB — COMPREHENSIVE METABOLIC PANEL
ALBUMIN: 3.2 g/dL — AB (ref 3.5–5.2)
ALT: 14 U/L (ref 0–53)
AST: 19 U/L (ref 0–37)
Alkaline Phosphatase: 72 U/L (ref 39–117)
BUN: 20 mg/dL (ref 6–23)
CALCIUM: 9 mg/dL (ref 8.4–10.5)
CO2: 28 mEq/L (ref 19–32)
Chloride: 98 mEq/L (ref 96–112)
Creatinine, Ser: 1.55 mg/dL — ABNORMAL HIGH (ref 0.50–1.35)
GFR calc Af Amer: 48 mL/min — ABNORMAL LOW (ref >90)
GFR calc non Af Amer: 41 mL/min — ABNORMAL LOW (ref >90)
GLUCOSE: 114 mg/dL — AB (ref 70–99)
POTASSIUM: 4.7 meq/L (ref 3.7–5.3)
SODIUM: 136 meq/L — AB (ref 137–147)
TOTAL PROTEIN: 7.8 g/dL (ref 6.0–8.3)
Total Bilirubin: 0.5 mg/dL (ref 0.3–1.2)

## 2013-04-18 LAB — LACTATE DEHYDROGENASE: LDH: 156 U/L (ref 94–250)

## 2013-04-18 MED ORDER — HEPARIN SOD (PORK) LOCK FLUSH 100 UNIT/ML IV SOLN
500.0000 [IU] | Freq: Once | INTRAVENOUS | Status: AC
  Administered 2013-04-18: 500 [IU] via INTRAVENOUS

## 2013-04-18 MED ORDER — AZITHROMYCIN 250 MG PO TABS: ORAL_TABLET | ORAL | Status: AC

## 2013-04-18 MED ORDER — SODIUM CHLORIDE 0.9 % IJ SOLN
10.0000 mL | INTRAMUSCULAR | Status: DC | PRN
  Administered 2013-04-18: 10 mL via INTRAVENOUS

## 2013-04-18 MED ORDER — HEPARIN SOD (PORK) LOCK FLUSH 100 UNIT/ML IV SOLN
INTRAVENOUS | Status: AC
  Filled 2013-04-18: qty 5

## 2013-04-18 NOTE — Patient Instructions (Signed)
[facility] Discharge Instructions  RECOMMENDATIONS MADE BY THE CONSULTANT AND ANY TEST RESULTS WILL BE SENT TO YOUR REFERRING PHYSICIAN.  EXAM FINDINGS BY THE PHYSICIAN TODAY AND SIGNS OR SYMPTOMS TO REPORT TO CLINIC OR PRIMARY PHYSICIAN: Exam and findings as discussed by Robynn Pane, PA-C.  Will give you an antibiotic to take for your cough.  Report increased cough, shortness of breath, etc.  MEDICATIONS PRESCRIBED:  Zpak - take as directed  INSTRUCTIONS/FOLLOW-UP: After scans.  Thank you for choosing [facility] to provide your oncology and hematology care.  To afford each patient quality time with our providers, please arrive at least 15 minutes before your scheduled appointment time.  With your help, our goal is to use those 15 minutes to complete the necessary work-up to ensure our physicians have the information they need to help with your evaluation and healthcare recommendations.    Effective January 1st, 2014, we ask that you re-schedule your appointment with our physicians should you arrive 10 or more minutes late for your appointment.  We strive to give you quality time with our providers, and arriving late affects you and other patients whose appointments are after yours.    Again, thank you for choosing [facility].  Our hope is that these requests will decrease the amount of time that you wait before being seen by our physicians.       _____________________________________________________________  Should you have questions after your visit to [facility], please contact our office at [phone] between the hours of 8:30 a.m. and 5:00 p.m.  Voicemails left after 4:30 p.m. will not be returned until the following business day.  For prescription refill requests, have your pharmacy contact our office with your prescription refill request.

## 2013-04-18 NOTE — Progress Notes (Signed)
Jeffrey [name] presented for Portacath access and flush. Proper placement of portacath confirmed by CXR. Portacath located right chest wall accessed with  H 20 needle. Good blood return present. Portacath flushed with 66ml NS and 500U/14ml Heparin and needle removed intact. Procedure without incident. Patient tolerated procedure well.

## 2013-04-19 NOTE — Progress Notes (Signed)
Labs drawn via PIV stick per Pam, Phlebotomist.

## 2013-05-01 ENCOUNTER — Ambulatory Visit (HOSPITAL_COMMUNITY): Admission: RE | Admit: 2013-05-01 | Payer: Managed Care, Other (non HMO) | Source: Ambulatory Visit

## 2013-05-01 ENCOUNTER — Other Ambulatory Visit (HOSPITAL_COMMUNITY): Payer: Self-pay | Admitting: Hematology and Oncology

## 2013-05-01 ENCOUNTER — Ambulatory Visit (HOSPITAL_COMMUNITY)
Admission: RE | Admit: 2013-05-01 | Discharge: 2013-05-01 | Disposition: A | Discharge status: Home / Self Care | Payer: Managed Care, Other (non HMO) | Source: Ambulatory Visit | Attending: Hematology and Oncology | Admitting: Hematology and Oncology

## 2013-05-01 DIAGNOSIS — R918 Other nonspecific abnormal finding of lung field: Secondary | ICD-10-CM | POA: Insufficient documentation

## 2013-05-01 DIAGNOSIS — N1330 Unspecified hydronephrosis: Secondary | ICD-10-CM | POA: Insufficient documentation

## 2013-05-01 DIAGNOSIS — C3490 Malignant neoplasm of unspecified part of unspecified bronchus or lung: Secondary | ICD-10-CM

## 2013-05-01 MED ORDER — IOHEXOL 300 MG/ML  SOLN
80.0000 mL | Freq: Once | INTRAMUSCULAR | Status: AC | PRN
  Administered 2013-05-01: 80 mL via INTRAVENOUS

## 2013-05-03 ENCOUNTER — Ambulatory Visit (HOSPITAL_COMMUNITY): Payer: Managed Care, Other (non HMO)

## 2013-05-07 ENCOUNTER — Encounter (HOSPITAL_BASED_OUTPATIENT_CLINIC_OR_DEPARTMENT_OTHER): Payer: Managed Care, Other (non HMO)

## 2013-05-07 ENCOUNTER — Encounter (HOSPITAL_COMMUNITY): Payer: Self-pay

## 2013-05-07 VITALS — BP 152/83 | HR 68 | Temp 97.7°F | Resp 18 | Wt 135.6 lb

## 2013-05-07 DIAGNOSIS — C329 Malignant neoplasm of larynx, unspecified: Secondary | ICD-10-CM

## 2013-05-07 DIAGNOSIS — C799 Secondary malignant neoplasm of unspecified site: Secondary | ICD-10-CM

## 2013-05-07 DIAGNOSIS — I43 Cardiomyopathy in diseases classified elsewhere: Secondary | ICD-10-CM

## 2013-05-07 DIAGNOSIS — C7800 Secondary malignant neoplasm of unspecified lung: Secondary | ICD-10-CM

## 2013-05-07 DIAGNOSIS — C801 Malignant (primary) neoplasm, unspecified: Secondary | ICD-10-CM

## 2013-05-07 DIAGNOSIS — C7839 Secondary malignant neoplasm of other respiratory organs: Secondary | ICD-10-CM

## 2013-05-07 NOTE — Patient Instructions (Addendum)
[facility] Discharge Instructions  RECOMMENDATIONS MADE BY THE CONSULTANT AND ANY TEST RESULTS WILL BE SENT TO YOUR REFERRING PHYSICIAN. You have a port flush May 14th at 12:30. You will return to see the doctor and have lab work in 2 months.   [facility] will call you to schedule a time to see Dr.Kinard  Thank you for choosing [facility] to provide your oncology and hematology care.  To afford each patient quality time with our providers, please arrive at least 15 minutes before your scheduled appointment time.  With your help, our goal is to use those 15 minutes to complete the necessary work-up to ensure our physicians have the information they need to help with your evaluation and healthcare recommendations.    Effective January 1st, 2014, we ask that you re-schedule your appointment with our physicians should you arrive 10 or more minutes late for your appointment.  We strive to give you quality time with our providers, and arriving late affects you and other patients whose appointments are after yours.    Again, thank you for choosing [name].  Our hope is that these requests will decrease the amount of time that you wait before being seen by our physicians.       _____________________________________________________________  Should you have questions after your visit to [facility], please contact our office at [phone] between the hours of 8:30 a.m. and 5:00 p.m.  Voicemails left after 4:30 p.m. will not be returned until the following business day.  For prescription refill requests, have your pharmacy contact our office with your prescription refill request.

## 2013-05-07 NOTE — Progress Notes (Signed)
[facility]  OFFICE PROGRESS NOTE  Jeffrey Battiest, MD [address] [place] [address]  DIAGNOSIS: No diagnosis found.  Chief Complaint  Patient presents with  . Squamous cell carcinoma in the lungs    CURRENT THERAPY: Tarceva 150 mg daily.  INTERVAL HISTORY: Jeffrey [name] 80 y.o. 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 He had previously been treated with 6 cycles are then Taxol prior to being started on Tarceva 150 mg daily.  He continues to do well with no cough, wheezing, but does have an episode of upper respiratory infection about 2 weeks ago treated successfully with a Z-Pak. Denies any chest pain, PND, orthopnea, palpitations, hemoptysis, melena, hematochezia, hematuria, diarrhea, constipation, lower extremity swelling or redness, skin rash, headache, or seizures.  MEDICAL HISTORY: Past Medical History  Diagnosis Date  . Tracheostomy in place   . Cancer 2010    laryngeal  . Lung cancer 2010  . Pneumonia 2014  . Dysrhythmia     takes Coreg and Lisinopril daily  . Shortness of breath     with exertion  . Enlarged prostate     takes flomax  . Urinary urgency   . Gastric ulcer     hx of  . Hx of transfusion of packed red blood cells     no abnormal reaction noted by patient  . Umbilical hernia   . Metastatic cancer 07/24/2012  . Hyperlipidemia   . ED (erectile dysfunction)   . Insomnia   . CHF (congestive heart failure)     INTERIM HISTORY: has TOBACCO ABUSE; EAR PAIN, LEFT; C O P D; NECK PAIN, LEFT; POSTNASAL DRIP SYNDROME; UNSPECIFIED RESPIRATORY ABNORMALITY; History of laryngeal cancer; History of lung cancer; Tracheostomy in place; Elevated brain natriuretic peptide (BNP) level; Pulmonary nodules; Bilateral hydronephrosis; Cardiomyopathy; Metastatic cancer; Prostate hypertrophy; and Insomnia  on his problem list.     Metastatic cancer    02/17/2011  Imaging  PET scan- B/L hypermetabolic pulmonary nodules (3 in total) are most consistent with metastatic disease. No evidence of mediastinal nodal metastasis. No evidence of metastasis in the abdomen or pelvis.    06/19/2012  Imaging  CT scan of chest- Slight enlargement of pulmonary nodules/metastasis.    07/31/2012 - 11/12/2012  Chemotherapy  Carboplatin/Paclitaxel x 6 cycles    11/26/2012  Imaging  CT CAP- Similar size of a R apical pulm nodule. Similar to improved left lower lobe nodularity, favored to be post infectious or inflammatory. Similar nodularity in the right middle lobe. No evidence of new or progressive disease. Similar borderline adeno    12/10/2012 -  Chemotherapy  Tarceva 150 mg daily     ALLERGIES:  has No Known Allergies.  MEDICATIONS: has a current medication list which includes the following prescription(s): aspirin, bisacodyl, carvedilol, erlotinib, esomeprazole, hydrocodone-acetaminophen, lidocaine-prilocaine, lisinopril, lorazepam, naproxen sodium, ondansetron, and tamsulosin.  SURGICAL HISTORY:  Past Surgical History  Procedure Laterality Date  . Tracheostomy  2010  . Lung removal, partial  2010    left side  . Layngectomy for laryngeal cancer in2010; left upper lobectomy for lung cancer, 2010.    . Back surgery      thinks it was done in 2012  . Esophagogastroduodenoscopy    . Colonoscopy    . Portacath placement Bilateral 07/12/2012    Procedure: INSERTION PORT-A-CATH;  Surgeon: Collier Salina  Prescott Gum, MD;  Location: [facility] OR;  Service: Thoracic;  Laterality: Bilateral;    FAMILY HISTORY: family history includes Diabetes in his father; Heart disease in his father.  SOCIAL HISTORY:  reports that he has been smoking Cigarettes.  He has been smoking about 1.00 pack per day. He has never used smokeless tobacco. He reports that he does not drink alcohol or use illicit drugs.  REVIEW OF SYSTEMS:  Other than that  discussed above is noncontributory.  PHYSICAL EXAMINATION: ECOG PERFORMANCE STATUS: 1 - Symptomatic but completely ambulatory  There were no vitals taken for this visit.  GENERAL:alert, no distress and comfortable SKIN: skin color, texture, turgor are normal, no rashes or significant lesions EYES: Left eye with opaque cornea. SINUSES: No redness or tenderness over maxillary or ethmoid sinuses OROPHARYNX:no exudate, no erythema on lips, buccal mucosa, or tongue. NECK: Supple with tracheostomy in place without evidence of purulence or hemorrhage. CHEST: Increased AP diameter with light port in place. LYMPH:  no palpable lymphadenopathy in the cervical, axillary or inguinal LUNGS: clear to auscultation and percussion with normal breathing effort HEART: regular rate & rhythm and no murmurs. ABDOMEN:abdomen soft, non-tender and normal bowel sounds MUSCULOSKELETAL:no cyanosis of digits and no clubbing. Range of motion normal.  NEURO: alert & oriented x 3 with fluent speech, no focal motor/sensory deficits   LABORATORY DATA: Infusion on 04/18/2013  Component Date Value Ref Range Status  . WBC 04/18/2013 4.3  4.0 - 10.5 K/uL Final  . RBC 04/18/2013 3.87* 4.22 - 5.81 MIL/uL Final  . Hemoglobin 04/18/2013 11.8* 13.0 - 17.0 g/dL Final  . HCT 04/18/2013 35.4* 39.0 - 52.0 % Final  . MCV 04/18/2013 91.5  78.0 - 100.0 fL Final  . MCH 04/18/2013 30.5  26.0 - 34.0 pg Final  . MCHC 04/18/2013 33.3  30.0 - 36.0 g/dL Final  . RDW 04/18/2013 14.2  11.5 - 15.5 % Final  . Platelets 04/18/2013 209  150 - 400 K/uL Final  . Neutrophils Relative % 04/18/2013 74  43 - 77 % Final  . Neutro Abs 04/18/2013 3.2  1.7 - 7.7 K/uL Final  . Lymphocytes Relative 04/18/2013 11* 12 - 46 % Final  . Lymphs Abs 04/18/2013 0.5* 0.7 - 4.0 K/uL Final  . Monocytes Relative 04/18/2013 9  3 - 12 % Final  . Monocytes Absolute 04/18/2013 0.4  0.1 - 1.0 K/uL Final  . Eosinophils Relative 04/18/2013 5  0 - 5 % Final  .  Eosinophils Absolute 04/18/2013 0.2  0.0 - 0.7 K/uL Final  . Basophils Relative 04/18/2013 1  0 - 1 % Final  . Basophils Absolute 04/18/2013 0.0  0.0 - 0.1 K/uL Final  . Sodium 04/18/2013 136* 137 - 147 mEq/L Final  . Potassium 04/18/2013 4.7  3.7 - 5.3 mEq/L Final  . Chloride 04/18/2013 98  96 - 112 mEq/L Final  . CO2 04/18/2013 28  19 - 32 mEq/L Final  . Glucose, Bld 04/18/2013 114* 70 - 99 mg/dL Final  . BUN 04/18/2013 20  6 - 23 mg/dL Final  . Creatinine, Ser 04/18/2013 1.55* 0.50 - 1.35 mg/dL Final  . Calcium 04/18/2013 9.0  8.4 - 10.5 mg/dL Final  . Total Protein 04/18/2013 7.8  6.0 - 8.3 g/dL Final  . Albumin 04/18/2013 3.2* 3.5 - 5.2 g/dL Final  . AST 04/18/2013 19  0 - 37 U/L Final  . ALT 04/18/2013 14  0 - 53 U/L Final  . Alkaline Phosphatase 04/18/2013 72  39 - 117  U/L Final  . Total Bilirubin 04/18/2013 0.5  0.3 - 1.2 mg/dL Final  . GFR calc non Af Amer 04/18/2013 41* >90 mL/min Final  . GFR calc Af Amer 04/18/2013 48* >90 mL/min Final   Comment: (NOTE)                          The eGFR has been calculated using the CKD EPI equation.                          This calculation has not been validated in all clinical situations.                          eGFR's persistently <90 mL/min signify possible Chronic Kidney                          Disease.  [name] Kitchen LDH 04/18/2013 156  94 - 250 U/L Final  . CEA 04/18/2013 0.9  0.0 - 5.0 ng/mL Final   Performed at [facility]: Squamous cell carcinoma  Urinalysis    Component Value Date/Time   COLORURINE YELLOW 01/24/2012 [place] 01/24/2012 1535   LABSPEC 1.015 01/24/2012 1535   PHURINE 5.5 01/24/2012 [place] 01/24/2012 1535   HGBUR NEGATIVE 01/24/2012 [place] 01/24/2012 [place] 01/24/2012 1535   PROTEINUR TRACE* 01/24/2012 1535   UROBILINOGEN 0.2 01/24/2012 1535   NITRITE NEGATIVE 01/24/2012 1535   LEUKOCYTESUR NEGATIVE 01/24/2012 1535    RADIOGRAPHIC  STUDIES: Ct Chest W Contrast  05/01/2013   CLINICAL DATA:  Restaging lung carcinoma. Maintenance oral chemotherapy. Radiation therapy complete. Prior left lung resection.  EXAM: CT CHEST, ABDOMEN, AND PELVIS WITH CONTRAST  TECHNIQUE: Multidetector CT imaging of the chest, abdomen and pelvis was performed following the standard protocol during bolus administration of intravenous contrast.  CONTRAST:  69m OMNIPAQUE IOHEXOL 300 MG/ML  SOLN  COMPARISON:  CT CHEST W/O CM dated 02/11/2013; NM PET IMAGE RESTAG (PS) SKULL BASE TO THIGH dated 02/17/2012; CT ABDOMEN W/CM dated 11/26/2012  FINDINGS: CT CHEST FINDINGS  There is a port in the right anterior chest wall. No axillary or supraclavicular lymphadenopathy. No mediastinal or hilar lymphadenopathy. There are small lower paratracheal lymph nodes measuring less than 10 mm short axis unchanged from prior. No pericardial fluid. Coronary calcifications are noted. Esophagus is normal.  Two adjacent pulmonary nodules at the right lung apex. The larger measures 19 x 15 compared to 16 by 12 mm on prior remeasured (image 10, series 3). Just inferior to the larger nodule there is a 10 mm nodule (image number 12) which is also increased from 7 mm on prior. There is linear fibrotic radiation change in the left upper lobe without new nodularity.  CT ABDOMEN AND PELVIS FINDINGS  There is no focal hepatic lesion. No focal hepatic lesion. The gallbladder is normal. The pancreatic duct is normal. The common bile duct is upper limits normal 7 mm. Spleen adrenal glands are normal.  The bilateral hydronephrosis and hydroureter. The bladder is distended. These findings are similar to CTA of 11/26/2012.  The stomach, small bowel, and colon are normal.  Abdominal aorta is normal caliber. No retroperitoneal periportal lymphadenopathy.  No free fluid the pelvis. Uterus is normal. The bladder is distended withbilateral hydroureter. There is bladder wall  thickening and trabeculation. No aggressive  osseous lesion.  IMPRESSION: 1. Interval increase in size of right upper lobe adjacent pulmonary nodules is highly concerning for progression of lung cancer recurrence. 2. No evidence metastasis within the abdomen or pelvis. 3. Bilateral hydronephrosis and hydroureter with distended bladder is consistent with bladder outlet obstruction. Findings are similar to CT of 11/26/2012.   Electronically Signed   By: Suzy Bouchard M.D.   On: 05/01/2013 10:18     ASSESSMENT:  1. Metastatic squamous cell malignancy in the lung either representing lung cancer with lung metastases or larynx cancer with lung metastases, squamous cell histology, currently on Tarceva 150 mg daily after 6 cycles of carboplatin and Taxol, tolerating well but with evidence of progression on most recent CT scan. 2. Cardiomyopathy, stable, no symptoms of heart failure or dysrhythmia    PLAN:  #1. A discussion was held with the patient, his wife, and his sister-in-law. Alternative interventions to include additional chemotherapy but having spoken with radiation oncology, the patient appears to be a good candidate for stereotactic body radiation. #2. He was referred to Dr. Sondra Come for consideration of that intervention. #3. Followup in 2 months with CBC and chem profile.  All questions were answered. The patient knows to call the clinic with any problems, questions or concerns. We can certainly see the patient much sooner if necessary.   I spent 25 minutes counseling the patient face to face. The total time spent in the appointment was 30 minutes.    Farrel Gobble, MD 05/07/2013 10:56 AM  DISCLAIMER:  This note was dictated with voice recognition software.  Similar sounding words can inadvertently be transcribed inaccurately and may not be corrected upon review.

## 2013-05-08 ENCOUNTER — Telehealth: Payer: Self-pay | Admitting: *Deleted

## 2013-05-08 NOTE — Progress Notes (Signed)
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  Patient presented for results of CT of chest that was done on 05/01/13.  Biopsies from 03/17/2008 revealed:   FINAL DIAGNOSIS  MICROSCOPIC EXAMINATION AND DIAGNOSIS  1. LARYNX, SUPRAGLOTTIC, BIOPSY: - INVASIVE SQUAMOUS CELL CARCINOMA, SEE COMMENT.  2. LYMPH NODE, AP WINDOW, RESECTION: - NO TUMOR IDENTIFIED.  3. LYMPH NODE, LEFT UPPER LOBE, EXCISION: - NO TUMOR IDENTIFIED.  4. LEFT LUNG, UPPER LOBE, LOBECTOMY: - INVASIVE SQUAMOUS CELL CARCINOMA, 4.5 CM. - NEGATIVE SURGICAL MARGINS OF RESECTION. - NO TUMOR IDENTIFIED IN FOUR HILAR LYMPH NODES. - SEE ONCOLOGY TABLE.  COMMENT 1. The invasive carcinoma is moderately differentiated and is associated with overlying in situ carcinoma. Hence, the changes are likely primary in nature at this site.  Tobacco/Marijuana/Snuff/ETOH use: current every day smoker.  Smokes 1 ppd.  Has smoked his whole life.  Denies ETOH use.   Past/Anticipated interventions by cardiothoracic surgery, if any: has had left lower lobe removed 03/17/2008, Had larynx removed 04/2008. V Has a stoma.  Past/Anticipated interventions by medical oncology, if any: currently on Tarceva 150 mg daily after 6 cycles of carboplatin and Taxol. Will stop Tarceva tomorrow.  Signs/Symptoms  Weight changes, if any: no  Respiratory complaints, if any: has a productive cough  Hemoptysis, if any: no  Pain issues, if any:  Occasional headaches/sinus pain.  SAFETY ISSUES:  Prior radiation? Larynx 05/2008 in [place]  Pacemaker/ICD? no  Possible current pregnancy? no  Is the patient on methotrexate? no  Current Complaints / other details:  Patient is here with his wife.  He has one son.

## 2013-05-08 NOTE — Telephone Encounter (Signed)
Called pt to introduce myself as the oncology nurse navigator that works with Dr. Sondra Come.  LVM indicating that I would be joining him during his appt tomorrow.  Gayleen Orem, RN, BSN, [facility] Oncology Navigator [phone]

## 2013-05-09 ENCOUNTER — Ambulatory Visit
Admission: RE | Admit: 2013-05-09 | Discharge: 2013-05-09 | Disposition: A | Discharge status: Home / Self Care | Payer: Managed Care, Other (non HMO) | Source: Ambulatory Visit | Attending: Radiation Oncology | Admitting: Radiation Oncology

## 2013-05-09 ENCOUNTER — Encounter: Payer: Self-pay | Admitting: Radiation Oncology

## 2013-05-09 VITALS — BP 122/78 | HR 76 | Temp 98.0°F | Ht 67.0 in | Wt 134.8 lb

## 2013-05-09 DIAGNOSIS — Z923 Personal history of irradiation: Secondary | ICD-10-CM | POA: Diagnosis not present

## 2013-05-09 DIAGNOSIS — Z79899 Other long term (current) drug therapy: Secondary | ICD-10-CM | POA: Diagnosis not present

## 2013-05-09 DIAGNOSIS — C799 Secondary malignant neoplasm of unspecified site: Secondary | ICD-10-CM

## 2013-05-09 DIAGNOSIS — Z7982 Long term (current) use of aspirin: Secondary | ICD-10-CM | POA: Diagnosis not present

## 2013-05-09 DIAGNOSIS — Z8521 Personal history of malignant neoplasm of larynx: Secondary | ICD-10-CM | POA: Diagnosis not present

## 2013-05-09 DIAGNOSIS — C7800 Secondary malignant neoplasm of unspecified lung: Secondary | ICD-10-CM | POA: Insufficient documentation

## 2013-05-09 DIAGNOSIS — Z85118 Personal history of other malignant neoplasm of bronchus and lung: Secondary | ICD-10-CM | POA: Diagnosis not present

## 2013-05-09 NOTE — Progress Notes (Signed)
Please see the Nurse Progress Note in the MD Initial Consult Encounter for this patient. 

## 2013-05-09 NOTE — Progress Notes (Signed)
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 Details of radiation therapy are pending with radiation treatments given at the [facility] in [place]  Narrative:  The patient returns today for further evaluation and consideration for additional radiation therapy out of the courtesy of Dr. Barnet Glasgow.  The patient has a prior history of laryngeal carcinoma and left upper lung cancer. He underwent surgery for both his laryngeal carcinoma and lung cancer. He also received postoperative radiation therapy to the neck region as part of management of his advanced laryngeal carcinoma.  At a later date the patient developed pulmonary metastasis either from his laryngeal carcinoma or lung cancer.  The patient has been under the care of medical oncology in [place]. The patient received 6 cycles of Taxol and more recently is being treated with Tarceva 150 mg daily. Most recent chest CT scan shows progression of the 2 nodules in the right upper lung area. Given this progression the patient is now seen in [facility] for consideration for treatment.      Patient denies any significant cough or hemoptysis. He denies any pain in the chest area. He denies any new bony pain headaches dizziness or blurred vision.                    ALLERGIES:  has No Known Allergies.  Meds: Current Outpatient Prescriptions  Medication Sig Dispense Refill  . aspirin 81 MG tablet Take 81 mg by mouth daily.      . carvedilol (COREG) 3.125 MG tablet Take 3.125 mg by mouth 2 (two) times daily with a meal.      . erlotinib  (TARCEVA) 150 MG tablet Take 150 mg by mouth daily. Take on an empty stomach 1 hour before meals or 2 hours after.      . esomeprazole (NEXIUM) 20 MG capsule Take 20 mg by mouth daily at 12 noon.      [name] Kitchen HYDROcodone-acetaminophen (NORCO/VICODIN) 5-325 MG per tablet Take 1 tablet by mouth every 6 (six) hours as needed for moderate pain. Takes 1/4 to 1/2 tablet when needed      . lidocaine-prilocaine (EMLA) cream Apply 1 application topically as needed. Apply to port 1 hour prior to accessing      . lisinopril (PRINIVIL,ZESTRIL) 5 MG tablet Take 5 mg by mouth daily.      [name] Kitchen LORazepam (ATIVAN) 1 MG tablet Take 1 mg by mouth at bedtime as needed for anxiety.      . naproxen sodium (ANAPROX) 220 MG tablet Take 200 mg by mouth as needed.      . tamsulosin (FLOMAX) 0.4 MG CAPS capsule Take 0.4 mg by mouth daily.      [name] Kitchen BISACODYL PO Take 1 tablet by mouth as needed.      . ondansetron (ZOFRAN) 8 MG tablet Take by mouth every 8 (eight) hours as needed for nausea or vomiting. Starting day of chemo as needed       No current facility-administered medications for this encounter.    Physical Findings: The patient is in no acute distress. Patient is alert and  oriented.  Accompanied by his wife on evaluation today  height is 5\' 7"  (1.702 m) and weight is 134 lb 12.8 oz (61.145 kg). His temperature is 98 F (36.7 C). His blood pressure is 122/78 and his pulse is 76. His oxygen saturation is 98%. .  The oral cavity is free of infection or mucosal lesion. Examination of the neck reveals no palpable adenopathy. Patient has a stoma in place in the anterior lower neck. He speaks well with his electrolarynx. No palpable supraclavicular or axillary adenopathy.  the patient has minimal radiation changes in the neck and supraclavicular region.  Lab Findings: Lab Results  Component Value Date   WBC 4.3 04/18/2013   HGB 11.8* 04/18/2013   HCT 35.4* 04/18/2013   MCV 91.5 04/18/2013   PLT 209 04/18/2013      Radiographic  Findings: Ct Chest W Contrast  05/01/2013   CLINICAL DATA:  Restaging lung carcinoma. Maintenance oral chemotherapy. Radiation therapy complete. Prior left lung resection.  EXAM: CT CHEST, ABDOMEN, AND PELVIS WITH CONTRAST  TECHNIQUE: Multidetector CT imaging of the chest, abdomen and pelvis was performed following the standard protocol during bolus administration of intravenous contrast.  CONTRAST:  17mL OMNIPAQUE IOHEXOL 300 MG/ML  SOLN  COMPARISON:  CT CHEST W/O CM dated 02/11/2013; NM PET IMAGE RESTAG (PS) SKULL BASE TO THIGH dated 02/17/2012; CT ABDOMEN W/CM dated 11/26/2012  FINDINGS: CT CHEST FINDINGS  There is a port in the right anterior chest wall. No axillary or supraclavicular lymphadenopathy. No mediastinal or hilar lymphadenopathy. There are small lower paratracheal lymph nodes measuring less than 10 mm short axis unchanged from prior. No pericardial fluid. Coronary calcifications are noted. Esophagus is normal.  Two adjacent pulmonary nodules at the right lung apex. The larger measures 19 x 15 compared to 16 by 12 mm on prior remeasured (image 10, series 3). Just inferior to the larger nodule there is a 10 mm nodule (image number 12) which is also increased from 7 mm on prior. There is linear fibrotic radiation change in the left upper lobe without new nodularity.  CT ABDOMEN AND PELVIS FINDINGS  There is no focal hepatic lesion. No focal hepatic lesion. The gallbladder is normal. The pancreatic duct is normal. The common bile duct is upper limits normal 7 mm. Spleen adrenal glands are normal.  The bilateral hydronephrosis and hydroureter. The bladder is distended. These findings are similar to CTA of 11/26/2012.  The stomach, small bowel, and colon are normal.  Abdominal aorta is normal caliber. No retroperitoneal periportal lymphadenopathy.  No free fluid the pelvis. Uterus is normal. The bladder is distended withbilateral hydroureter. There is bladder wall thickening and trabeculation. No  aggressive osseous lesion.  IMPRESSION: 1. Interval increase in size of right upper lobe adjacent pulmonary nodules is highly concerning for progression of lung cancer recurrence. 2. No evidence metastasis within the abdomen or pelvis. 3. Bilateral hydronephrosis and hydroureter with distended bladder is consistent with bladder outlet obstruction. Findings are similar to CT of 11/26/2012.   Electronically Signed   By: Suzy Bouchard M.D.   On: 05/01/2013 10:18   Ct Abdomen Pelvis W Contrast  05/03/2013   CLINICAL DATA:  Restaging lung carcinoma. Maintenance oral chemotherapy. Radiation therapy complete. Prior left lung resection.  EXAM: CT CHEST, ABDOMEN, AND PELVIS WITH CONTRAST  TECHNIQUE: Multidetector CT imaging of the chest, abdomen and pelvis was performed following the standard protocol during bolus administration of intravenous contrast.  CONTRAST:  59mL OMNIPAQUE IOHEXOL 300 MG/ML SOLN  COMPARISON:  CT CHEST W/O CM dated 02/11/2013; NM PET IMAGE RESTAG (PS) SKULL BASE TO THIGH dated 02/17/2012; CT ABDOMEN W/CM dated 11/26/2012  FINDINGS: CT CHEST FINDINGS  There is a port in the right anterior chest wall. No axillary or supraclavicular lymphadenopathy. No mediastinal or hilar lymphadenopathy. There are small lower paratracheal lymph nodes measuring less than 10 mm short axis unchanged from prior. No pericardial fluid. Coronary calcifications are noted. Esophagus is normal.  Two adjacent pulmonary nodules at the right lung apex. The larger measures 19 x 15 compared to 16 by 12 mm on prior remeasured (image 10, series 3). Just inferior to the larger nodule there is a 10 mm nodule (image number 12) which is also increased from 7 mm on prior. There is linear fibrotic radiation change in the left upper lobe without new nodularity.  CT ABDOMEN AND PELVIS FINDINGS  There is no focal hepatic lesion. No focal hepatic lesion. The gallbladder is normal. The pancreatic duct is normal. The common bile duct is upper  limits normal 7 mm. Spleen adrenal glands are normal.  The bilateral hydronephrosis and hydroureter. The bladder is distended. These findings are similar to CTA of 11/26/2012.  The stomach, small bowel, and colon are normal.  Abdominal aorta is normal caliber. No retroperitoneal periportal lymphadenopathy.  No free fluid the pelvis. Uterus is normal. The bladder is distended withbilateral hydroureter. There is bladder wall thickening and trabeculation. No aggressive osseous lesion.  IMPRESSION: *Interval increase in size of right upper lobe adjacent pulmonary nodules is highly concerning for progression of lung cancer recurrence. *No evidence metastasis within the abdomen or pelvis. *Bilateral hydronephrosis and hydroureter with distended bladder is consistent with bladder outlet obstruction. Findings are similar to CT of 11/26/2012.   Electronically Signed   By: Suzy Bouchard M.D.   On: 05/03/2013 10:52    Impression:  Oligometastasis either from left lung cancer or laryngeal carcinoma.  Treatment of this area is somewhat complicated in light of prior radiation therapy to this area. The patient's records from [place]  will be reviewed.  Plan:  The patient undergo SBRT/IMRT simulation on May 5.  He will be treated by Dr. Pablo Ledger who treated him for his laryngeal carcinoma.  ____________________________________ Blair Promise, MD

## 2013-05-21 ENCOUNTER — Ambulatory Visit
Admission: RE | Admit: 2013-05-21 | Discharge: 2013-05-21 | Disposition: A | Discharge status: Home / Self Care | Payer: Managed Care, Other (non HMO) | Source: Ambulatory Visit | Attending: Radiation Oncology | Admitting: Radiation Oncology

## 2013-05-21 DIAGNOSIS — C799 Secondary malignant neoplasm of unspecified site: Secondary | ICD-10-CM

## 2013-05-21 DIAGNOSIS — C7800 Secondary malignant neoplasm of unspecified lung: Secondary | ICD-10-CM | POA: Diagnosis not present

## 2013-05-21 NOTE — Progress Notes (Signed)
[place] Radiation Oncology Simulation and Treatment Planning Note   Name: Jeffrey [name] MRN: [id number]  Date: 05/21/2013  DOB: 1933-02-12  Status: outpatient  DIAGNOSIS: Stage I lung cancer of the right upper lobe  SIDE: right  CONSENT VERIFIED: yes  SET UP AND IMMOBILIZATION: Patient is setup supine in a vac loc with a custom moldable pillow for head and neck immobilization   NARRATIVE: The patient was brought to the [place].  Identity was confirmed.  All relevant records and images related to the planned course of therapy were reviewed.  Then, the patient was positioned in a stable reproducible clinical set-up for radiation therapy.  CT images were obtained.  Skin markings were placed.  A four dimensional simulation was then performed to track tumor movement throughout the patients' breathing cycle. The CT images were loaded into the planning software where the target and avoidance structures were contoured.  The GTV was outlined on the free breathing, 4D and MIP image sets.  The radiation prescription was entered and confirmed.   TREATMENT PLANNING NOTE:  Treatment planning then occurred. I have requested 3D simulation with [facility] of the spinal cord, total lungs and gross tumor volume. I have also requested mlcs and an isodose plan.   Special treatment procedure will be performed as [name] will be receiving high dose per fraction.  His previous RT plan will be recreated from the DRRs.  A cumulative dose report will be computed.

## 2013-05-30 ENCOUNTER — Encounter (HOSPITAL_COMMUNITY): Payer: Managed Care, Other (non HMO) | Attending: Oncology

## 2013-05-30 DIAGNOSIS — R918 Other nonspecific abnormal finding of lung field: Secondary | ICD-10-CM | POA: Insufficient documentation

## 2013-05-30 DIAGNOSIS — C3490 Malignant neoplasm of unspecified part of unspecified bronchus or lung: Secondary | ICD-10-CM | POA: Insufficient documentation

## 2013-05-30 LAB — LACTATE DEHYDROGENASE: LDH: 178 U/L (ref 94–250)

## 2013-05-30 LAB — COMPREHENSIVE METABOLIC PANEL
ALBUMIN: 3.3 g/dL — AB (ref 3.5–5.2)
ALK PHOS: 74 U/L (ref 39–117)
ALT: 8 U/L (ref 0–53)
AST: 16 U/L (ref 0–37)
BUN: 24 mg/dL — AB (ref 6–23)
CO2: 26 mEq/L (ref 19–32)
Calcium: 8.7 mg/dL (ref 8.4–10.5)
Chloride: 101 mEq/L (ref 96–112)
Creatinine, Ser: 1.82 mg/dL — ABNORMAL HIGH (ref 0.50–1.35)
GFR calc Af Amer: 40 mL/min — ABNORMAL LOW (ref >90)
GFR calc non Af Amer: 34 mL/min — ABNORMAL LOW (ref >90)
Glucose, Bld: 121 mg/dL — ABNORMAL HIGH (ref 70–99)
Potassium: 4.6 mEq/L (ref 3.7–5.3)
Sodium: 137 mEq/L (ref 137–147)
Total Bilirubin: 0.2 mg/dL — ABNORMAL LOW (ref 0.3–1.2)
Total Protein: 7.3 g/dL (ref 6.0–8.3)

## 2013-05-30 LAB — CBC WITH DIFFERENTIAL/PLATELET
BASOS PCT: 1 % (ref 0–1)
Basophils Absolute: 0 10*3/uL (ref 0.0–0.1)
Eosinophils Absolute: 0.2 10*3/uL (ref 0.0–0.7)
Eosinophils Relative: 6 % — ABNORMAL HIGH (ref 0–5)
HCT: 35 % — ABNORMAL LOW (ref 39.0–52.0)
Hemoglobin: 12 g/dL — ABNORMAL LOW (ref 13.0–17.0)
Lymphocytes Relative: 19 % (ref 12–46)
Lymphs Abs: 0.8 10*3/uL (ref 0.7–4.0)
MCH: 31.2 pg (ref 26.0–34.0)
MCHC: 34.3 g/dL (ref 30.0–36.0)
MCV: 90.9 fL (ref 78.0–100.0)
Monocytes Absolute: 0.3 10*3/uL (ref 0.1–1.0)
Monocytes Relative: 7 % (ref 3–12)
NEUTROS ABS: 2.6 10*3/uL (ref 1.7–7.7)
Neutrophils Relative %: 67 % (ref 43–77)
Platelets: 179 10*3/uL (ref 150–400)
RBC: 3.85 MIL/uL — AB (ref 4.22–5.81)
RDW: 14.8 % (ref 11.5–15.5)
WBC: 3.9 10*3/uL — AB (ref 4.0–10.5)

## 2013-05-30 MED ORDER — SODIUM CHLORIDE 0.9 % IJ SOLN
10.0000 mL | INTRAMUSCULAR | Status: DC | PRN
  Administered 2013-05-30: 10 mL via INTRAVENOUS

## 2013-05-30 MED ORDER — HEPARIN SOD (PORK) LOCK FLUSH 100 UNIT/ML IV SOLN
500.0000 [IU] | Freq: Once | INTRAVENOUS | Status: AC
  Administered 2013-05-30: 500 [IU] via INTRAVENOUS
  Filled 2013-05-30: qty 5

## 2013-05-30 NOTE — Progress Notes (Signed)
Jeffrey [name] presented for Portacath access and flush.  Portacath located rt chest wall accessed with  H 20 needle. Good blood return present. Portacath flushed with 57ml NS and 500U/32ml Heparin and needle removed intact. Procedure without incident. Patient tolerated procedure well.

## 2013-05-31 ENCOUNTER — Telehealth: Payer: Self-pay

## 2013-05-31 NOTE — Telephone Encounter (Signed)
Patient's wife returned call and I informed her that we will not be proceeding with radiation per Dr.Wentworth.She will like to speak with doctor to see what plan is from here and states ok to leave message on cell phone [phone].

## 2013-06-04 ENCOUNTER — Ambulatory Visit: Payer: Managed Care, Other (non HMO) | Admitting: Radiation Oncology

## 2013-06-06 ENCOUNTER — Ambulatory Visit: Payer: Managed Care, Other (non HMO) | Admitting: Radiation Oncology

## 2013-06-10 ENCOUNTER — Ambulatory Visit: Payer: Managed Care, Other (non HMO) | Admitting: Radiation Oncology

## 2013-06-12 ENCOUNTER — Ambulatory Visit: Payer: Managed Care, Other (non HMO) | Admitting: Radiation Oncology

## 2013-06-14 ENCOUNTER — Ambulatory Visit: Payer: Managed Care, Other (non HMO) | Admitting: Radiation Oncology

## 2013-06-17 ENCOUNTER — Ambulatory Visit: Payer: Managed Care, Other (non HMO) | Admitting: Radiation Oncology

## 2013-06-19 ENCOUNTER — Other Ambulatory Visit: Payer: Self-pay | Admitting: Family Medicine

## 2013-07-08 ENCOUNTER — Encounter (HOSPITAL_COMMUNITY): Payer: Self-pay

## 2013-07-08 ENCOUNTER — Encounter (HOSPITAL_COMMUNITY): Payer: Managed Care, Other (non HMO)

## 2013-07-08 ENCOUNTER — Encounter (HOSPITAL_BASED_OUTPATIENT_CLINIC_OR_DEPARTMENT_OTHER): Payer: Managed Care, Other (non HMO)

## 2013-07-08 ENCOUNTER — Encounter (HOSPITAL_COMMUNITY): Payer: Managed Care, Other (non HMO) | Attending: Oncology

## 2013-07-08 VITALS — BP 130/81 | HR 82 | Temp 97.9°F | Resp 18 | Wt 140.2 lb

## 2013-07-08 DIAGNOSIS — G620 Drug-induced polyneuropathy: Secondary | ICD-10-CM

## 2013-07-08 DIAGNOSIS — C7802 Secondary malignant neoplasm of left lung: Secondary | ICD-10-CM

## 2013-07-08 DIAGNOSIS — R918 Other nonspecific abnormal finding of lung field: Secondary | ICD-10-CM

## 2013-07-08 DIAGNOSIS — C801 Malignant (primary) neoplasm, unspecified: Secondary | ICD-10-CM

## 2013-07-08 DIAGNOSIS — C3490 Malignant neoplasm of unspecified part of unspecified bronchus or lung: Secondary | ICD-10-CM | POA: Insufficient documentation

## 2013-07-08 DIAGNOSIS — C329 Malignant neoplasm of larynx, unspecified: Secondary | ICD-10-CM

## 2013-07-08 DIAGNOSIS — C7800 Secondary malignant neoplasm of unspecified lung: Secondary | ICD-10-CM | POA: Insufficient documentation

## 2013-07-08 DIAGNOSIS — T451X5A Adverse effect of antineoplastic and immunosuppressive drugs, initial encounter: Secondary | ICD-10-CM

## 2013-07-08 LAB — CBC WITH DIFFERENTIAL/PLATELET
BASOS ABS: 0.1 10*3/uL (ref 0.0–0.1)
BASOS PCT: 1 % (ref 0–1)
EOS PCT: 6 % — AB (ref 0–5)
Eosinophils Absolute: 0.3 10*3/uL (ref 0.0–0.7)
HEMATOCRIT: 37 % — AB (ref 39.0–52.0)
Hemoglobin: 12.4 g/dL — ABNORMAL LOW (ref 13.0–17.0)
Lymphocytes Relative: 18 % (ref 12–46)
Lymphs Abs: 0.8 10*3/uL (ref 0.7–4.0)
MCH: 30.9 pg (ref 26.0–34.0)
MCHC: 33.5 g/dL (ref 30.0–36.0)
MCV: 92.3 fL (ref 78.0–100.0)
MONO ABS: 0.4 10*3/uL (ref 0.1–1.0)
Monocytes Relative: 10 % (ref 3–12)
Neutro Abs: 2.9 10*3/uL (ref 1.7–7.7)
Neutrophils Relative %: 65 % (ref 43–77)
Platelets: 168 10*3/uL (ref 150–400)
RBC: 4.01 MIL/uL — ABNORMAL LOW (ref 4.22–5.81)
RDW: 14.5 % (ref 11.5–15.5)
WBC: 4.5 10*3/uL (ref 4.0–10.5)

## 2013-07-08 LAB — COMPREHENSIVE METABOLIC PANEL WITH GFR
ALT: 7 U/L (ref 0–53)
AST: 13 U/L (ref 0–37)
Albumin: 3.6 g/dL (ref 3.5–5.2)
Alkaline Phosphatase: 69 U/L (ref 39–117)
BUN: 37 mg/dL — ABNORMAL HIGH (ref 6–23)
CO2: 23 meq/L (ref 19–32)
Calcium: 8.8 mg/dL (ref 8.4–10.5)
Chloride: 105 meq/L (ref 96–112)
Creatinine, Ser: 1.99 mg/dL — ABNORMAL HIGH (ref 0.50–1.35)
GFR calc Af Amer: 36 mL/min — ABNORMAL LOW
GFR calc non Af Amer: 31 mL/min — ABNORMAL LOW
Glucose, Bld: 101 mg/dL — ABNORMAL HIGH (ref 70–99)
Potassium: 4.8 meq/L (ref 3.7–5.3)
Sodium: 141 meq/L (ref 137–147)
Total Bilirubin: 0.3 mg/dL (ref 0.3–1.2)
Total Protein: 7.6 g/dL (ref 6.0–8.3)

## 2013-07-08 LAB — LACTATE DEHYDROGENASE: LDH: 180 U/L (ref 94–250)

## 2013-07-08 NOTE — Progress Notes (Signed)
[place]  OFFICE PROGRESS NOTE  [name], MD [address] [place] [address]  DIAGNOSIS: Secondary malignant neoplasm of left lung - Plan: CT Abdomen Pelvis W Contrast, CT Chest W Contrast  No chief complaint on file.   CURRENT THERAPY: Completed maintenance Tarceva therapy due to progression of disease on 05/07/2013. Radiotherapy evaluation for possible SBRT resulted in 2 much overlap from previous radiation so no therapy was rendered.  INTERVAL HISTORY: Jeffrey [name] 80 y.o. male returns for followup after radiotherapy evaluation for possible SBRT to progressive metastatic disease in the lungs, squamous cell type, status post resection of left upper lobe nodule measuring 4.5 cm performed on 03/17/2008 long with resection of supraglottic larynx on the same day without lymph node involvement.  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 Therefore SBRT was recommended. Peripheral paresthesias still exists. He denies any problems with his tracheostomy regarding bleeding or purulence. Appetite has been good with no dysphagia. He still has a very dry mouth. He denies any lower extremity swelling or redness, PND, orthopnea, palpitations, chest pain, abdominal distention, skin rash, headache, or seizures.  MEDICAL HISTORY: Past Medical History  Diagnosis Date  . Tracheostomy in place   . Cancer 2010    laryngeal  . Lung cancer 2010  . Pneumonia 2014  . Dysrhythmia     takes Coreg and Lisinopril daily  . Shortness of breath     with exertion  . Enlarged prostate     takes flomax  . Urinary urgency   . Gastric ulcer     hx of  . Hx of transfusion of packed red blood cells     no abnormal reaction noted by patient  . Umbilical hernia   . Metastatic cancer 07/24/2012  . Hyperlipidemia   . ED (erectile  dysfunction)   . Insomnia   . CHF (congestive heart failure)     INTERIM HISTORY: has TOBACCO ABUSE; EAR PAIN, LEFT; C O P D; NECK PAIN, LEFT; POSTNASAL DRIP SYNDROME; UNSPECIFIED RESPIRATORY ABNORMALITY; History of laryngeal cancer; History of lung cancer; Tracheostomy in place; Elevated brain natriuretic peptide (BNP) level; Pulmonary nodules; Bilateral hydronephrosis; Cardiomyopathy; Metastatic cancer; Prostate hypertrophy; and Insomnia on his problem list.   Metastatic cancer    02/17/2011  Imaging  PET scan- B/L hypermetabolic pulmonary nodules (3 in total) are most consistent with metastatic disease. No evidence of mediastinal nodal metastasis. No evidence of metastasis in the abdomen or pelvis.    06/19/2012  Imaging  CT scan of chest- Slight enlargement of pulmonary nodules/metastasis.    07/31/2012 - 11/12/2012  Chemotherapy  Carboplatin/Paclitaxel x 6 cycles    11/26/2012  Imaging  CT CAP- Similar size of a R apical pulm nodule. Similar to improved left lower lobe nodularity, favored to be post infectious or inflammatory. Similar nodularity in the right middle lobe. No evidence of new or progressive disease. Similar borderline adeno    12/10/2012 -  Chemotherapy  Tarceva 150 mg daily    He had laryngeal biopsy and left upper lobectomy on 03/17/2008 and pathology showed;  1. LARYNX, SUPRAGLOTTIC, BIOPSY: - INVASIVE SQUAMOUS CELL CARCINOMA, SEE COMMENT.  2. LYMPH NODE, AP WINDOW, RESECTION: - NO TUMOR IDENTIFIED.  3. LYMPH NODE, LEFT UPPER LOBE, EXCISION: - NO TUMOR IDENTIFIED.  4. LEFT LUNG, UPPER LOBE, LOBECTOMY: - INVASIVE SQUAMOUS CELL CARCINOMA,  4.5 CM. - NEGATIVE SURGICAL MARGINS OF RESECTION.  Subsequently laryngectomy on 05/06/2008 showed;  1. LYMPH NODE, PRETRACHEAL, EXCISIONAL BIOPSY: ONE LYMPH NODE, NEGATIVE FOR METASTATIC CARCINOMA (0/1)   2. LARYNX, LARYNGECTOMY: - INVASIVE MODERATELY DIFFERENTIATED SQUAMOUS CELL CARCINOMA, INVADING INTO CARTILAGE AND INVOLVING  MUSCULAR TISSUE. - ALL RESECTIONS MARGINS ARE NEGATIVE - SEE ONCOLOGY FOR DETAILS;      ALLERGIES:  has No Known Allergies.  MEDICATIONS: has a current medication list which includes the following prescription(s): aspirin, bisacodyl, carvedilol, erlotinib, esomeprazole, hydrocodone-acetaminophen, lidocaine-prilocaine, lisinopril, lisinopril, lorazepam, naproxen sodium, ondansetron, and tamsulosin.  SURGICAL HISTORY:  Past Surgical History  Procedure Laterality Date  . Tracheostomy  2010  . Lung removal, partial  2010    left side  . Layngectomy for laryngeal cancer in2010; left upper lobectomy for lung cancer, 2010.    . Back surgery      thinks it was done in 2012  . Esophagogastroduodenoscopy    . Colonoscopy    . Portacath placement Bilateral 07/12/2012    Procedure: INSERTION PORT-A-CATH;  Surgeon: [name], MD;  Location: [facility] OR;  Service: Thoracic;  Laterality: Bilateral;    FAMILY HISTORY: family history includes Diabetes in his father; Heart disease in his father.  SOCIAL HISTORY:  reports that he has been smoking Cigarettes.  He has a 70 pack-year smoking history. He has never used smokeless tobacco. He reports that he does not drink alcohol or use illicit drugs.  REVIEW OF SYSTEMS:  Other than that discussed above is noncontributory.  PHYSICAL EXAMINATION: ECOG PERFORMANCE STATUS: 1 - Symptomatic but completely ambulatory  There were no vitals taken for this visit.  GENERAL:alert, no distress and comfortable SKIN: skin color, texture, turgor are normal, no rashes or significant lesions EYES: PERLA; Conjunctiva are pink and non-injected, sclera clear SINUSES: No redness or tenderness over maxillary or ethmoid sinuses OROPHARYNX:no exudate, no erythema on lips, buccal mucosa, or tongue. Xerostomia is present. NECK: supple, thyroid normal size, non-tender, without nodularity. No masses. Tracheostomy in place with no evidence of purulence or bleeding. CHEST:  Increased AP diameter with light port in place. LYMPH:  no palpable lymphadenopathy in the cervical, axillary or inguinal LUNGS: clear to auscultation and percussion with normal breathing effort HEART: regular rate & rhythm and no murmurs. ABDOMEN:abdomen soft, non-tender and normal bowel sounds MUSCULOSKELETAL:no cyanosis of digits and no clubbing. Range of motion normal.  NEURO: alert & oriented x 3 with fluent speech, no focal motor/sensory deficits. Decreased deep tendon reflexes bilaterally.   LABORATORY DATA: No visits with results within 30 Day(s) from this visit. Latest known visit with results is:  Infusion on 05/30/2013  Component Date Value Ref Range Status  . WBC 05/30/2013 3.9* 4.0 - 10.5 K/uL Final  . RBC 05/30/2013 3.85* 4.22 - 5.81 MIL/uL Final  . Hemoglobin 05/30/2013 12.0* 13.0 - 17.0 g/dL Final  . HCT 05/30/2013 35.0* 39.0 - 52.0 % Final  . MCV 05/30/2013 90.9  78.0 - 100.0 fL Final  . MCH 05/30/2013 31.2  26.0 - 34.0 pg Final  . MCHC 05/30/2013 34.3  30.0 - 36.0 g/dL Final  . RDW 05/30/2013 14.8  11.5 - 15.5 % Final  . Platelets 05/30/2013 179  150 - 400 K/uL Final  . Neutrophils Relative % 05/30/2013 67  43 - 77 % Final  . Neutro Abs 05/30/2013 2.6  1.7 - 7.7 K/uL Final  . Lymphocytes Relative 05/30/2013 19  12 - 46 % Final  . Lymphs Abs 05/30/2013 0.8  0.7 -  4.0 K/uL Final  . Monocytes Relative 05/30/2013 7  3 - 12 % Final  . Monocytes Absolute 05/30/2013 0.3  0.1 - 1.0 K/uL Final  . Eosinophils Relative 05/30/2013 6* 0 - 5 % Final  . Eosinophils Absolute 05/30/2013 0.2  0.0 - 0.7 K/uL Final  . Basophils Relative 05/30/2013 1  0 - 1 % Final  . Basophils Absolute 05/30/2013 0.0  0.0 - 0.1 K/uL Final  . Sodium 05/30/2013 137  137 - 147 mEq/L Final  . Potassium 05/30/2013 4.6  3.7 - 5.3 mEq/L Final  . Chloride 05/30/2013 101  96 - 112 mEq/L Final  . CO2 05/30/2013 26  19 - 32 mEq/L Final  . Glucose, Bld 05/30/2013 121* 70 - 99 mg/dL Final  . BUN 05/30/2013  24* 6 - 23 mg/dL Final  . Creatinine, Ser 05/30/2013 1.82* 0.50 - 1.35 mg/dL Final  . Calcium 05/30/2013 8.7  8.4 - 10.5 mg/dL Final  . Total Protein 05/30/2013 7.3  6.0 - 8.3 g/dL Final  . Albumin 05/30/2013 3.3* 3.5 - 5.2 g/dL Final  . AST 05/30/2013 16  0 - 37 U/L Final  . ALT 05/30/2013 8  0 - 53 U/L Final  . Alkaline Phosphatase 05/30/2013 74  39 - 117 U/L Final  . Total Bilirubin 05/30/2013 0.2* 0.3 - 1.2 mg/dL Final  . GFR calc non Af Amer 05/30/2013 34* >90 mL/min Final  . GFR calc Af Amer 05/30/2013 40* >90 mL/min Final   Comment: (NOTE)                          The eGFR has been calculated using the CKD EPI equation.                          This calculation has not been validated in all clinical situations.                          eGFR's persistently <90 mL/min signify possible Chronic Kidney                          Disease.  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  Urinalysis    Component Value Date/Time   COLORURINE YELLOW 01/24/2012 1535   APPEARANCEUR CLEAR 01/24/2012 1535   LABSPEC 1.015 01/24/2012 1535   PHURINE 5.5 01/24/2012 1535   GLUCOSEU NEGATIVE 01/24/2012 1535   HGBUR NEGATIVE 01/24/2012 1535   BILIRUBINUR NEGATIVE 01/24/2012 1535   KETONESUR NEGATIVE 01/24/2012 1535   PROTEINUR TRACE* 01/24/2012 1535   UROBILINOGEN 0.2 01/24/2012 1535   NITRITE NEGATIVE 01/24/2012 1535   LEUKOCYTESUR NEGATIVE 01/24/2012 1535    RADIOGRAPHIC STUDIES: CT Abdomen Pelvis W Contrast Status: Final result         PACS Images    Show images for CT Abdomen Pelvis W Contrast         Study Result    CLINICAL DATA: Restaging lung carcinoma. Maintenance oral  chemotherapy. Radiation therapy complete. Prior left lung resection.  EXAM:  CT CHEST, ABDOMEN, AND PELVIS WITH CONTRAST  TECHNIQUE:  Multidetector CT imaging of the chest, abdomen and pelvis was  performed following the standard protocol during bolus    administration of intravenous contrast.  CONTRAST: 56m OMNIPAQUE IOHEXOL 300 MG/ML SOLN  COMPARISON: CT  CHEST W/O CM dated 02/11/2013; NM PET IMAGE RESTAG  (PS) SKULL BASE TO THIGH dated 02/17/2012; CT ABDOMEN W/CM dated  11/26/2012  FINDINGS:  CT CHEST FINDINGS  There is a port in the right anterior chest wall. No axillary or  supraclavicular lymphadenopathy. No mediastinal or hilar  lymphadenopathy. There are small lower paratracheal lymph nodes  measuring less than 10 mm short axis unchanged from prior. No  pericardial fluid. Coronary calcifications are noted. Esophagus is  normal.  Two adjacent pulmonary nodules at the right lung apex. The larger  measures 19 x 15 compared to 16 by 12 mm on prior remeasured (image  10, series 3). Just inferior to the larger nodule there is a 10 mm  nodule (image number 12) which is also increased from 7 mm on prior.  There is linear fibrotic radiation change in the left upper lobe  without new nodularity.  CT ABDOMEN AND PELVIS FINDINGS  There is no focal hepatic lesion. No focal hepatic lesion. The  gallbladder is normal. The pancreatic duct is normal. The common  bile duct is upper limits normal 7 mm. Spleen adrenal glands are  normal.  The bilateral hydronephrosis and hydroureter. The bladder is  distended. These findings are similar to CTA of 11/26/2012.  The stomach, small bowel, and colon are normal.  Abdominal aorta is normal caliber. No retroperitoneal periportal  lymphadenopathy.  No free fluid the pelvis. Uterus is normal. The bladder is distended  withbilateral hydroureter. There is bladder wall thickening and  trabeculation. No aggressive osseous lesion.  IMPRESSION:  *Interval increase in size of right upper lobe adjacent pulmonary  nodules is highly concerning for progression of lung cancer  recurrence.  *No evidence metastasis within the abdomen or pelvis.  *Bilateral hydronephrosis and hydroureter with distended bladder is   consistent with bladder outlet obstruction. Findings are similar to  CT of 11/26/2012.  Electronically Signed  By: Suzy Bouchard M.D.  On: 05/03/2013 10:52      ASSESSMENT:  1. Metastatic squamous cell malignancy in the lung either representing lung cancer with lung metastases or larynx cancer with lung metastases, squamous cell histology, currently on Tarceva 150 mg daily after 6 cycles of carboplatin and Taxol, tolerating well but with evidence of progression on most recent CT scan.  2. Cardiomyopathy, stable, no symptoms of heart failure or dysrhythmia. 3. 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    PLAN:  #1. Due to radiation field overlap, SBRT could not be delivered to the progressive right upper lobe nodule. #2. He has been off maintenance treatment since April of 2015. #3. Plan is to repeat CT scans of the chest abdomen and pelvis in early July 2015 and based upon the tempo of progression, decide whether to treat with single agent or more aggressive intervention. Patient is a candidate for Nivolumab in my opinion and probably would offer him the best opportunity for control of disease with minimal toxicity. #4. CT scans on 07/23/2013 with followup on 07/25/2013 for discussion of future intervention.   All questions were answered. The patient knows to call the clinic with any problems, questions or concerns. We can certainly see the patient much sooner if necessary.   I spent 25 minutes counseling the patient face to face. The total time spent in the appointment was 30 minutes.    Doroteo Bradford, MD 07/08/2013 9:06 AM  DISCLAIMER:  This note was dictated with voice recognition software.  Similar sounding words can inadvertently be transcribed inaccurately and may not be  corrected upon review.

## 2013-07-08 NOTE — Progress Notes (Signed)
Labs drawn for cbcd,ldh,cmp.

## 2013-07-08 NOTE — Patient Instructions (Signed)
[facility] Discharge Instructions  RECOMMENDATIONS MADE BY THE CONSULTANT AND ANY TEST RESULTS WILL BE SENT TO YOUR REFERRING PHYSICIAN.  EXAM FINDINGS BY THE PHYSICIAN TODAY AND SIGNS OR SYMPTOMS TO REPORT TO CLINIC OR PRIMARY PHYSICIAN: Exam and findings as discussed by Dr. Barnet Glasgow.  Will repeat scans in July and see you afterwards to determine what our next course of action will be.  MEDICATIONS PRESCRIBED:  none  INSTRUCTIONS/FOLLOW-UP: Follow-up after scans.  Thank you for choosing [facility] to provide your oncology and hematology care.  To afford each patient quality time with our providers, please arrive at least 15 minutes before your scheduled appointment time.  With your help, our goal is to use those 15 minutes to complete the necessary work-up to ensure our physicians have the information they need to help with your evaluation and healthcare recommendations.    Effective January 1st, 2014, we ask that you re-schedule your appointment with our physicians should you arrive 10 or more minutes late for your appointment.  We strive to give you quality time with our providers, and arriving late affects you and other patients whose appointments are after yours.    Again, thank you for choosing [facility].  Our hope is that these requests will decrease the amount of time that you wait before being seen by our physicians.       _____________________________________________________________  Should you have questions after your visit to [facility], please contact our office at [phone] between the hours of 8:30 a.m. and 5:00 p.m.  Voicemails left after 4:30 p.m. will not be returned until the following business day.  For prescription refill requests, have your pharmacy contact our office with your prescription refill request.

## 2013-07-22 ENCOUNTER — Other Ambulatory Visit: Payer: Self-pay | Admitting: Family Medicine

## 2013-07-23 ENCOUNTER — Other Ambulatory Visit (HOSPITAL_COMMUNITY): Payer: Self-pay | Admitting: Hematology and Oncology

## 2013-07-23 ENCOUNTER — Ambulatory Visit (HOSPITAL_COMMUNITY)
Admission: RE | Admit: 2013-07-23 | Discharge: 2013-07-23 | Disposition: A | Discharge status: Home / Self Care | Payer: Managed Care, Other (non HMO) | Source: Ambulatory Visit | Attending: Hematology and Oncology | Admitting: Hematology and Oncology

## 2013-07-23 ENCOUNTER — Encounter (HOSPITAL_COMMUNITY): Payer: Self-pay

## 2013-07-23 DIAGNOSIS — IMO0002 Reserved for concepts with insufficient information to code with codable children: Secondary | ICD-10-CM | POA: Insufficient documentation

## 2013-07-23 DIAGNOSIS — I7090 Unspecified atherosclerosis: Secondary | ICD-10-CM | POA: Diagnosis not present

## 2013-07-23 DIAGNOSIS — R918 Other nonspecific abnormal finding of lung field: Secondary | ICD-10-CM | POA: Insufficient documentation

## 2013-07-23 DIAGNOSIS — J438 Other emphysema: Secondary | ICD-10-CM | POA: Insufficient documentation

## 2013-07-23 DIAGNOSIS — C7800 Secondary malignant neoplasm of unspecified lung: Secondary | ICD-10-CM | POA: Insufficient documentation

## 2013-07-23 DIAGNOSIS — C7802 Secondary malignant neoplasm of left lung: Secondary | ICD-10-CM

## 2013-07-23 DIAGNOSIS — I2584 Coronary atherosclerosis due to calcified coronary lesion: Secondary | ICD-10-CM | POA: Diagnosis not present

## 2013-07-23 LAB — POCT I-STAT CREATININE: CREATININE: 2.5 mg/dL — AB (ref 0.50–1.35)

## 2013-07-25 ENCOUNTER — Encounter (HOSPITAL_COMMUNITY): Payer: Managed Care, Other (non HMO) | Attending: Oncology

## 2013-07-25 ENCOUNTER — Encounter (HOSPITAL_COMMUNITY): Payer: Self-pay

## 2013-07-25 VITALS — BP 124/79 | HR 78 | Temp 98.2°F | Resp 18 | Wt 137.5 lb

## 2013-07-25 DIAGNOSIS — Z85118 Personal history of other malignant neoplasm of bronchus and lung: Secondary | ICD-10-CM | POA: Insufficient documentation

## 2013-07-25 DIAGNOSIS — R918 Other nonspecific abnormal finding of lung field: Secondary | ICD-10-CM | POA: Insufficient documentation

## 2013-07-25 DIAGNOSIS — C7802 Secondary malignant neoplasm of left lung: Secondary | ICD-10-CM

## 2013-07-25 DIAGNOSIS — C3402 Malignant neoplasm of left main bronchus: Secondary | ICD-10-CM

## 2013-07-25 DIAGNOSIS — C329 Malignant neoplasm of larynx, unspecified: Secondary | ICD-10-CM

## 2013-07-25 DIAGNOSIS — C3400 Malignant neoplasm of unspecified main bronchus: Secondary | ICD-10-CM

## 2013-07-25 DIAGNOSIS — C801 Malignant (primary) neoplasm, unspecified: Secondary | ICD-10-CM | POA: Insufficient documentation

## 2013-07-25 DIAGNOSIS — E039 Hypothyroidism, unspecified: Secondary | ICD-10-CM | POA: Insufficient documentation

## 2013-07-25 DIAGNOSIS — C3490 Malignant neoplasm of unspecified part of unspecified bronchus or lung: Secondary | ICD-10-CM | POA: Insufficient documentation

## 2013-07-25 DIAGNOSIS — C7800 Secondary malignant neoplasm of unspecified lung: Secondary | ICD-10-CM

## 2013-07-25 MED ORDER — TERAZOSIN HCL 5 MG PO CAPS: ORAL_CAPSULE | ORAL | Status: AC

## 2013-07-25 NOTE — Patient Instructions (Addendum)
[facility] Discharge Instructions  RECOMMENDATIONS MADE BY THE CONSULTANT AND ANY TEST RESULTS WILL BE SENT TO YOUR REFERRING PHYSICIAN.  EXAM FINDINGS BY THE PHYSICIAN TODAY AND SIGNS OR SYMPTOMS TO REPORT TO CLINIC OR PRIMARY PHYSICIAN:  Return to see [place] next week for Opdivo. Thursday July 16 @ 9:45. Infusion takes 1 hour. Prepare to be here for 3 hours.   Jeffrey [name] to teach - Jeffrey [name] can do over the phone if you prefer. Jeffrey [name] will call you.   Stop flomax. Start Hytrin.   Hytrin 1 or 2 capsules at bedtime for urinary hesitancy.   Follow up with Dr. Barnet Glasgow on July 30 @ 8:30    Thank you for choosing [facility] to provide your oncology and hematology care.  To afford each patient quality time with our providers, please arrive at least 15 minutes before your scheduled appointment time.  With your help, our goal is to use those 15 minutes to complete the necessary work-up to ensure our physicians have the information they need to help with your evaluation and healthcare recommendations.    Effective January 1st, 2014, we ask that you re-schedule your appointment with our physicians should you arrive 10 or more minutes late for your appointment.  We strive to give you quality time with our providers, and arriving late affects you and other patients whose appointments are after yours.    Again, thank you for choosing [facility].  Our hope is that these requests will decrease the amount of time that you wait before being seen by our physicians.       _____________________________________________________________  Should you have questions after your visit to [facility], please contact our office at [phone] between the hours of 8:30 a.m. and 5:00 p.m.  Voicemails left after 4:30 p.m. will not be returned until the following business day.  For prescription refill requests, have your pharmacy contact our office with your  prescription refill request.    Terazosin capsules What is this medicine? TERAZOSIN (ter AY zoe sin) is an antihypertensive. It works by relaxing the blood vessels. It is used to treat benign prostatic hyperplasia (BPH) in men and to treat high blood pressure in both men and women. This medicine may be used for other purposes; ask your health care provider or pharmacist if you have questions. COMMON BRAND NAME(S): Hytrin What should I tell my health care provider before I take this medicine? They need to know if you have any of the following conditions: -an unusual or allergic reaction to terazosin, other medicines, foods, dyes, or preservatives -pregnant or trying to get pregnant -breast-feeding How should I use this medicine? Take this medicine by mouth with a glass of water. Follow the directions on the prescription label. You can take this medicine with or without food. Take your doses at regular intervals. Do not take your medicine more often than directed. Do not stop taking except on the advice of your doctor or health care professional. Talk to your pediatrician regarding the use of this medicine in children. Special care may be needed. Overdosage: If you think you have taken too much of this medicine contact a poison control center or emergency room at once. NOTE: This medicine is only for you. Do not share this medicine with others. What if I miss a dose? If you miss a dose, take it as soon as you can. If it is almost time for your next dose, take  only that dose. Do not take double or extra doses. What may interact with this medicine? -diuretics -medicines for high blood pressure -sildenafil -tadalafil -vardenafil This list may not describe all possible interactions. Give your health care provider a list of all the medicines, herbs, non-prescription drugs, or dietary supplements you use. Also tell them if you smoke, drink alcohol, or use illegal drugs. Some items may interact with  your medicine. What should I watch for while using this medicine? Visit your doctor or health care professional for regular checks on your progress. Check your blood pressure regularly. Ask your doctor or health care professional what your blood pressure should be and when you should contact him or her. Drowsiness and dizziness are more likely to occur after the first dose, after an increase in dose, or during hot weather or exercise. These effects can decrease once your body adjusts to this medicine. Do not drive, use machinery, or do anything that needs mental alertness until you know how this drug affects you. Do not stand or sit up quickly, especially if you are an older patient. This reduces the risk of dizzy or fainting spells. Alcohol can make you more drowsy and dizzy. Avoid alcoholic drinks. Do not treat yourself for coughs, colds, or pain while you are taking this medicine without asking your doctor or health care professional for advice. Some ingredients may increase your blood pressure. Your mouth may get dry. Chewing sugarless gum or sucking hard candy, and drinking plenty of water may help. Contact your doctor if the problem does not go away or is severe. For males, contact your doctor or health care professional right away if you have an erection that lasts longer than 4 hours or if it becomes painful. This may be a sign of a serious problem and must be treated right away to prevent permanent damage. What side effects may I notice from receiving this medicine? Side effects that you should report to your doctor or health care professional as soon as possible: -blurred vision -difficulty breathing, shortness of breath -fainting spells, light headedness -fast or irregular heartbeat, palpitations or chest pain -males: prolonged or painful erection -swelling of the legs and ankles -unusually weak or tired Side effects that usually do not require medical attention (report to your doctor or  health care professional if they continue or are bothersome): -headache -nausea -nasal stuffiness This list may not describe all possible side effects. Call your doctor for medical advice about side effects. You may report side effects to FDA at 1-800-FDA-1088. Where should I keep my medicine? Keep out of the reach of children. Store at room temperature between 20 and 25 degrees C (68 and 77 degrees F). Higher temperatures may cause the capsules to soften or melt. Protect from light and moisture. Throw away any unused medicine after the expiration date. NOTE: This sheet is a summary. It may not cover all possible information. If you have questions about this medicine, talk to your doctor, pharmacist, or health care provider.  2015, [employer]. (2012-01-04 14:07:39) Nivolumab injection What is this medicine? NIVOLUMAB (nye VOL ue mab) is used to treat certain types of melanoma. This medicine may be used for other purposes; ask your health care provider or pharmacist if you have questions. COMMON BRAND NAME(S): Opdivo What should I tell my health care provider before I take this medicine? They need to know if you have any of these conditions: -eye disease, vision problems -history of pancreatitis -immune system problems -inflammatory bowel disease -  kidney disease -liver disease -lung disease -lupus -myasthenia gravis -multiple sclerosis -organ transplant -stomach or intestine problems -thyroid disease -tingling of the fingers or toes, or other nerve disorder -an unusual or allergic reaction to nivolumab, other medicines, foods, dyes, or preservatives -pregnant or trying to get pregnant -breast-feeding How should I use this medicine? This medicine is for infusion into a vein. It is given by a health care professional in a hospital or clinic setting. A special MedGuide will be given to you before each treatment. Be sure to read this information carefully each time. Talk  to your pediatrician regarding the use of this medicine in children. Special care may be needed. Overdosage: If you think you've taken too much of this medicine contact a poison control center or emergency room at once. Overdosage: If you think you have taken too much of this medicine contact a poison control center or emergency room at once. NOTE: This medicine is only for you. Do not share this medicine with others. What if I miss a dose? It is important not to miss your dose. Call your doctor or health care professional if you are unable to keep an appointment. What may interact with this medicine? Interactions have not been studied. This list may not describe all possible interactions. Give your health care provider a list of all the medicines, herbs, non-prescription drugs, or dietary supplements you use. Also tell them if you smoke, drink alcohol, or use illegal drugs. Some items may interact with your medicine. What should I watch for while using this medicine? Tell your doctor or healthcare professional if your symptoms do not start to get better or if they get worse. Your condition will be monitored carefully while you are receiving this medicine. You may need blood work done while you are taking this medicine. What side effects may I notice from receiving this medicine? Side effects that you should report to your doctor or health care professional as soon as possible: -allergic reactions like skin rash, itching or hives, swelling of the face, lips, or tongue -black, tarry stools -bloody or watery diarrhea -changes in vision -chills -cough -depressed mood -eye pain -feeling anxious -fever -general ill feeling or flu-like symptoms -hair loss -loss of appetite -pain, tingling, numbness in the hands or feet -redness, blistering, peeling or loosening of the skin, including inside the mouth -red pinpoint spots on skin -signs and symptoms of a dangerous change in heartbeat or heart  rhythm like chest pain; dizziness; fast or irregular heartbeat; palpitations; feeling faint or lightheaded, falls; breathing problems -signs and symptoms of high blood sugar such as dizziness; dry mouth; dry skin; fruity breath; nausea; stomach pain; increased hunger or thirst; increased urination -signs and symptoms of kidney injury like trouble passing urine or change in the amount of urine -signs and symptoms of liver injury like dark yellow or brown urine; general ill feeling or flu-like symptoms; light-colored stools; loss of appetite; nausea; right upper belly pain; unusually weak or tired; yellowing of the eyes or skin -swelling of the ankles, feet, hands -weight gain This list may not describe all possible side effects. Call your doctor for medical advice about side effects. You may report side effects to FDA at 1-800-FDA-1088. Where should I keep my medicine? This drug is given in a hospital or clinic and will not be stored at home. NOTE: This sheet is a summary. It may not cover all possible information. If you have questions about this medicine, talk to your doctor, pharmacist, or  health care provider.  2015, [employer] Standard. (2013-01-15 17:16:03)

## 2013-07-25 NOTE — Progress Notes (Signed)
[facility]  OFFICE PROGRESS NOTE  [name], MD [place] [address]  DIAGNOSIS: Malignant neoplasm of hilus of left lung  Secondary malignant neoplasm of left lung  Larynx cancer  Chief Complaint  Patient presents with  . Lung Cancer    CURRENT THERAPY: Completed maintenance Tarceva therapy due to progression of disease on 05/07/2013. Radiotherapy evaluation for possible SBRT to lung lesion resulted in too much overlap from previous radiation therapy possibly producing hypoglossal nerve injury) as of the right upper extremity. Repeat CT scan was done to assess tempo of disease.  INTERVAL HISTORY: Jeffrey [name] 80 y.o. 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  From 07/31/2012 until 11/12/2012 patient received 6 cycles of carboplatin and Taxol because of enlargement of pulmonary nodules. He was placed on maintenance Tarceva on 12/10/2012 which was stopped after repeat CT scan on 05/01/2013 showed progression of disease. Patient not a candidate for SBRT so alternative treatment will be discussed today. He continues to do well except for urinary hesitancy. Appetite is good with no worsening cough or shortness of breath. Tracheostomy is without stomal bleeding or purulence. No episodes of diarrhea, constipation, melena, hematochezia, hematuria, lower extremity swelling or redness, PND, orthopnea, palpitations, headache, or seizures.  MEDICAL HISTORY: Past Medical History  Diagnosis Date  . Tracheostomy in place   . Cancer 2010    laryngeal  . Lung cancer 2010  . Pneumonia 2014  . Dysrhythmia     takes Coreg and Lisinopril  daily  . Shortness of breath     with exertion  . Enlarged prostate     takes flomax  . Urinary urgency   . Gastric ulcer     hx of  . Hx of transfusion of packed red blood cells     no abnormal reaction noted by patient  . Umbilical hernia   . Metastatic cancer 07/24/2012  . Hyperlipidemia   . ED (erectile dysfunction)   . Insomnia   . CHF (congestive heart failure)     INTERIM HISTORY: has TOBACCO ABUSE; EAR PAIN, LEFT; C O P D; NECK PAIN, LEFT; POSTNASAL DRIP SYNDROME; UNSPECIFIED RESPIRATORY ABNORMALITY; History of laryngeal cancer; History of lung cancer; Tracheostomy in place; Elevated brain natriuretic peptide (BNP) level; Pulmonary nodules; Bilateral hydronephrosis; Cardiomyopathy; Metastatic cancer; Prostate hypertrophy; and Insomnia on his problem list.   Metastatic cancer    02/17/2011  Imaging  PET scan- B/L hypermetabolic pulmonary nodules (3 in total) are most consistent with metastatic disease. No evidence of mediastinal nodal metastasis. No evidence of metastasis in the abdomen or pelvis.    06/19/2012  Imaging  CT scan of chest- Slight enlargement of pulmonary nodules/metastasis.    07/31/2012 - 11/12/2012  Chemotherapy  Carboplatin/Paclitaxel x 6 cycles    11/26/2012  Imaging  CT CAP- Similar size of a R apical pulm nodule. Similar to improved left lower lobe nodularity, favored to be post infectious or inflammatory. Similar nodularity in the right middle lobe. No evidence of new or progressive disease. Similar borderline adeno    12/10/2012 -  Chemotherapy  Tarceva 150 mg daily    Laryngeal biopsy and left upper lobectomy  on 03/17/2008 and pathology showed;  1. LARYNX, SUPRAGLOTTIC, BIOPSY: - INVASIVE SQUAMOUS CELL CARCINOMA, SEE COMMENT.  2. LYMPH NODE, AP WINDOW, RESECTION: - NO TUMOR IDENTIFIED.  3. LYMPH NODE, LEFT UPPER LOBE, EXCISION: - NO TUMOR IDENTIFIED.  4. LEFT LUNG, UPPER LOBE, LOBECTOMY: - INVASIVE SQUAMOUS CELL CARCINOMA, 4.5 CM. - NEGATIVE SURGICAL  MARGINS OF RESECTION.  Subsequently laryngectomy on 05/06/2008 showed;  1. LYMPH NODE, PRETRACHEAL, EXCISIONAL BIOPSY: ONE LYMPH NODE, NEGATIVE FOR METASTATIC CARCINOMA (0/1)   2. LARYNX, LARYNGECTOMY: - INVASIVE MODERATELY DIFFERENTIATED SQUAMOUS CELL CARCINOMA, INVADING INTO CARTILAGE AND INVOLVING MUSCULAR TISSUE. - ALL RESECTIONS MARGINS ARE NEGATIVE - SEE ONCOLOGY FOR DETAILS;   ALLERGIES:  has No Known Allergies.  MEDICATIONS: has a current medication list which includes the following prescription(s): aspirin, bisacodyl, carvedilol, esomeprazole, hydrocodone-acetaminophen, lidocaine-prilocaine, lisinopril, lorazepam, naproxen sodium, ondansetron, and terazosin.  SURGICAL HISTORY:  Past Surgical History  Procedure Laterality Date  . Tracheostomy  2010  . Lung removal, partial  2010    left side  . Layngectomy for laryngeal cancer in2010; left upper lobectomy for lung cancer, 2010.    . Back surgery      thinks it was done in 2012  . Esophagogastroduodenoscopy    . Colonoscopy    . Portacath placement Bilateral 07/12/2012    Procedure: INSERTION PORT-A-CATH;  Surgeon: [name], MD;  Location: [facility] OR;  Service: Thoracic;  Laterality: Bilateral;    FAMILY HISTORY: family history includes Diabetes in his father; Heart disease in his father.  SOCIAL HISTORY:  reports that he has been smoking Cigarettes.  He has a 70 pack-year smoking history. He has never used smokeless tobacco. He reports that he does not drink alcohol or use illicit drugs.  REVIEW OF SYSTEMS:  Other than that discussed above is noncontributory.  PHYSICAL EXAMINATION: ECOG PERFORMANCE STATUS: 1 - Symptomatic but completely ambulatory  Blood pressure 124/79, pulse 78, temperature 98.2 F (36.8 C), temperature source Oral, resp. rate 18, weight 137 lb 8 oz (62.37 kg), SpO2 98.00%.  GENERAL:alert, no distress and comfortable SKIN: skin color, texture, turgor are normal, no rashes or significant  lesions EYES: left cornea opaque. SINUSES: No redness or tenderness over maxillary or ethmoid sinuses OROPHARYNX:no exudate, no erythema on lips, buccal mucosa, or tongue. NECK: supple, thyroid normal size, non-tender, without nodularity. No masses. Tracheostomy in place with no masses but bilateral neck induration from previous radiation. CHEST: Increased AP diameter with light port in place. LYMPH:  no palpable lymphadenopathy in the cervical, axillary or inguinal LUNGS: clear to auscultation and percussion with normal breathing effort HEART: regular rate & rhythm and no murmurs. ABDOMEN:abdomen soft, non-tender and normal bowel sounds MUSCULOSKELETAL:no cyanosis of digits and no clubbing. Range of motion normal.  NEURO: alert & oriented x 3 with fluent speech, no focal motor/sensory deficits   LABORATORY DATA: Hospital Outpatient Visit on 07/23/2013  Component Date Value Ref Range Status  . Creatinine, Ser 07/23/2013 2.50* 0.50 - 1.35 mg/dL Final  Lab on 07/08/2013  Component Date Value Ref Range Status  . WBC 07/08/2013 4.5  4.0 - 10.5 K/uL Final  . RBC 07/08/2013 4.01* 4.22 - 5.81 MIL/uL Final  . Hemoglobin 07/08/2013 12.4* 13.0 - 17.0 g/dL Final  . HCT 07/08/2013 37.0* 39.0 - 52.0 % Final  . MCV 07/08/2013 92.3  78.0 - 100.0 fL Final  . MCH 07/08/2013 30.9  26.0 - 34.0 pg Final  . MCHC 07/08/2013 33.5  30.0 - 36.0 g/dL Final  . RDW 07/08/2013 14.5  11.5 - 15.5 % Final  . Platelets 07/08/2013 168  150 - 400 K/uL Final  . Neutrophils Relative % 07/08/2013 65  43 - 77 % Final  . Neutro Abs 07/08/2013 2.9  1.7 - 7.7 K/uL Final  . Lymphocytes Relative 07/08/2013 18  12 - 46 % Final  . Lymphs Abs 07/08/2013 0.8  0.7 - 4.0 K/uL Final  . Monocytes Relative 07/08/2013 10  3 - 12 % Final  . Monocytes Absolute 07/08/2013 0.4  0.1 - 1.0 K/uL Final  . Eosinophils Relative 07/08/2013 6* 0 - 5 % Final  . Eosinophils Absolute 07/08/2013 0.3  0.0 - 0.7 K/uL Final  . Basophils Relative  07/08/2013 1  0 - 1 % Final  . Basophils Absolute 07/08/2013 0.1  0.0 - 0.1 K/uL Final  . Sodium 07/08/2013 141  137 - 147 mEq/L Final  . Potassium 07/08/2013 4.8  3.7 - 5.3 mEq/L Final  . Chloride 07/08/2013 105  96 - 112 mEq/L Final  . CO2 07/08/2013 23  19 - 32 mEq/L Final  . Glucose, Bld 07/08/2013 101* 70 - 99 mg/dL Final  . BUN 07/08/2013 37* 6 - 23 mg/dL Final  . Creatinine, Ser 07/08/2013 1.99* 0.50 - 1.35 mg/dL Final  . Calcium 07/08/2013 8.8  8.4 - 10.5 mg/dL Final  . Total Protein 07/08/2013 7.6  6.0 - 8.3 g/dL Final  . Albumin 07/08/2013 3.6  3.5 - 5.2 g/dL Final  . AST 07/08/2013 13  0 - 37 U/L Final  . ALT 07/08/2013 7  0 - 53 U/L Final  . Alkaline Phosphatase 07/08/2013 69  39 - 117 U/L Final  . Total Bilirubin 07/08/2013 0.3  0.3 - 1.2 mg/dL Final  . GFR calc non Af Amer 07/08/2013 31* >90 mL/min Final  . GFR calc Af Amer 07/08/2013 36* >90 mL/min Final   Comment: (NOTE)                          The eGFR has been calculated using the CKD EPI equation.                          This calculation has not been validated in all clinical situations.                          eGFR's persistently <90 mL/min signify possible Chronic Kidney                          Disease.  [name] Kitchen LDH 07/08/2013 180  94 - 250 U/L Final    PATHOLOGY: Squamous cell carcinoma  Urinalysis    Component Value Date/Time   COLORURINE YELLOW 01/24/2012 1535   APPEARANCEUR CLEAR 01/24/2012 1535   LABSPEC 1.015 01/24/2012 1535   PHURINE 5.5 01/24/2012 1535   GLUCOSEU NEGATIVE 01/24/2012 1535   HGBUR NEGATIVE 01/24/2012 1535   BILIRUBINUR NEGATIVE 01/24/2012 1535   KETONESUR NEGATIVE 01/24/2012 1535   PROTEINUR TRACE* 01/24/2012 1535   UROBILINOGEN 0.2 01/24/2012 1535   NITRITE NEGATIVE 01/24/2012 1535   LEUKOCYTESUR NEGATIVE 01/24/2012 1535    RADIOGRAPHIC STUDIES: Ct Abdomen Pelvis Wo Contrast  07/23/2013   CLINICAL DATA:  Metastatic lung cancer.  EXAM: CT CHEST, ABDOMEN AND PELVIS WITHOUT CONTRAST  TECHNIQUE:  Multidetector CT imaging of the chest, abdomen and pelvis was performed following the standard protocol without IV contrast.  COMPARISON:  05/01/2013.  FINDINGS: CT CHEST FINDINGS  Stable small left pleural effusion. Moderate to advanced changes of centrilobular emphysema identified. Two right apical lesions are again noted. The larger measures 2.8 cm, image 8/ series 3. Previously 1.9 cm. The smaller, adjacent lesion measures 1.3 cm, image 10/ series 3. Previously 10.3 cm. Sub solid nodule in the right middle lobe measures 1.5 cm, image 35/series 3. Previously 1.5 cm. Left lung volume loss is stable from previous exam. Similar appearance of paramediastinal left apical fibrosis.  There is mild cardiac enlargement. No pericardial effusion identified. Calcified atherosclerotic disease involves the thoracic aorta. Calcifications involving the LAD and left circumflex coronary arteries noted. No pathologically enlarged mediastinal or hilar lymph nodes identified.  There is no axillary or supraclavicular adenopathy.  Review of the visualized bony structures shows diffuse osteopenia. There are postsurgical changes involving the posterior lateral left ribs. There is a scoliosis deformity and mild multi level degenerative disc disease within the thoracic spine.  CT ABDOMEN AND PELVIS FINDINGS  The exam detail is diminished due to lack of IV contrast material. This limits sensitivity for detecting solid organ pathology. No focal liver abnormality noted. The gallbladder is normal. Mild increase caliber of the common bile duct appears similar to previous exam. The spleen is on unremarkable.  The adrenal glands are both normal. Bilateral hydronephrosis and hydroureter is again identified. There is moderate distension of the urinary bladder. The bladder wall appears trabeculated. There is prostate gland enlargement which measure 5.2 cm in diameter.  Normal caliber of the abdominal aorta. There is calcified atherosclerotic disease  without evidence for aneurysm. No retroperitoneal adenopathy. Nose mesenteric adenopathy. There is no pelvic or inguinal adenopathy.  No free fluid or abnormal fluid collection within the abdomen or pelvis. The stomach is normal. The small bowel loops have a normal course and caliber. No obstruction. Normal appearance of the colon.  Review of the visualized bony structures is significant for mild multi level degenerative disc disease. This appears most advanced at the L5-S1 level. No aggressive lytic or sclerotic bone lesions identified.  IMPRESSION: 1. Interval increase in size of right upper lobe pulmonary nodules consistent with progression of lung cancer recurrence. 2. No significant change in bilateral hydronephrosis, hydroureter and distended bladder. Findings are likely secondary to chronic bladder outlet obstruction. 3. Emphysema 4. Atherosclerotic disease including coronary artery calcifications.   Electronically Signed   By: Kerby Moors M.D.   On: 07/23/2013 12:04   Ct Chest Wo Contrast  07/23/2013   CLINICAL DATA:  Metastatic lung cancer.  EXAM: CT CHEST, ABDOMEN AND PELVIS WITHOUT CONTRAST  TECHNIQUE: Multidetector CT imaging of the chest, abdomen and pelvis was performed following the standard protocol without IV contrast.  COMPARISON:  05/01/2013.  FINDINGS: CT CHEST FINDINGS  Stable small left pleural effusion. Moderate to advanced changes of centrilobular emphysema identified. Two right apical lesions are again noted. The larger measures 2.8 cm, image 8/ series 3. Previously 1.9 cm. The smaller, adjacent lesion measures 1.3 cm, image 10/ series 3. Previously 10.3 cm. Sub solid nodule in the right middle lobe measures 1.5 cm, image 35/series 3. Previously 1.5 cm. Left lung volume loss is stable from previous exam. Similar appearance of paramediastinal left apical fibrosis.  There is mild cardiac enlargement. No pericardial effusion identified. Calcified atherosclerotic disease involves the  thoracic aorta. Calcifications involving the LAD and left circumflex coronary arteries noted. No pathologically enlarged mediastinal or hilar lymph nodes identified.  There is no axillary or supraclavicular adenopathy.  Review  of the visualized bony structures shows diffuse osteopenia. There are postsurgical changes involving the posterior lateral left ribs. There is a scoliosis deformity and mild multi level degenerative disc disease within the thoracic spine.  CT ABDOMEN AND PELVIS FINDINGS  The exam detail is diminished due to lack of IV contrast material. This limits sensitivity for detecting solid organ pathology. No focal liver abnormality noted. The gallbladder is normal. Mild increase caliber of the common bile duct appears similar to previous exam. The spleen is on unremarkable.  The adrenal glands are both normal. Bilateral hydronephrosis and hydroureter is again identified. There is moderate distension of the urinary bladder. The bladder wall appears trabeculated. There is prostate gland enlargement which measure 5.2 cm in diameter.  Normal caliber of the abdominal aorta. There is calcified atherosclerotic disease without evidence for aneurysm. No retroperitoneal adenopathy. Nose mesenteric adenopathy. There is no pelvic or inguinal adenopathy.  No free fluid or abnormal fluid collection within the abdomen or pelvis. The stomach is normal. The small bowel loops have a normal course and caliber. No obstruction. Normal appearance of the colon.  Review of the visualized bony structures is significant for mild multi level degenerative disc disease. This appears most advanced at the L5-S1 level. No aggressive lytic or sclerotic bone lesions identified.  IMPRESSION: 1. Interval increase in size of right upper lobe pulmonary nodules consistent with progression of lung cancer recurrence. 2. No significant change in bilateral hydronephrosis, hydroureter and distended bladder. Findings are likely secondary to  chronic bladder outlet obstruction. 3. Emphysema 4. Atherosclerotic disease including coronary artery calcifications.   Electronically Signed   By: Kerby Moors M.D.   On: 07/23/2013 12:04    ASSESSMENT:  #1. Progressive metastatic squamous cell carcinoma of the lung lung with lung metastases. #2. Cardiomyopathy, stable, without symptoms or signs of heart failure or dysrhythmia. #3. 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 Chronic obstructive pulmonary disease, no longer smoking.   PLAN:  #1. A discussion was held regarding future treatment. Recommendations for Nivolumab every 2 weeks as a one-hour infusion was offered. The drug is a checkpoint inhibitor basically re-educating  the immune system to recognize the presence of cancer and to facilitate its disruption by mobilization of T- lymphocytes via natural killer cells and ADCC. #2. Chemotherapy education tomorrow or Monday with institution of therapy hopefully on 07/30/2013 #3. Followup 2 weeks after initial treatment for discussion of tolerability. Both the patient and his wife expressed an understanding of this strategy.   All questions were answered. The patient knows to call the clinic with any problems, questions or concerns. We can certainly see the patient much sooner if necessary.   I spent 25 minutes counseling the patient face to face. The total time spent in the appointment was 30 minutes with treatment.    Doroteo Bradford, MD 07/25/2013 3:21 PM  DISCLAIMER:  This note was dictated with voice recognition software.  Similar sounding words can inadvertently be transcribed inaccurately and may not be corrected upon review.

## 2013-07-26 NOTE — Patient Instructions (Signed)
[facility]   CHEMOTHERAPY INSTRUCTIONS  Jeffrey [name] is indicated for the treatment of patients with metastatic squamous non-small cell lung cancer (NSCLC) with progression on or after platinum-based chemotherapy.   It is an immunotherapy, which works with your own immune system to fight cancer.   Your immune system is normally your body's first defense against threats like lung cancer, but sometimes lung cancer sends a signal that can prevent your immune system from doing its job. Opdivo blocks this signal, helping to restore the immune response against the tumor cells. While doing so, Opdivo could also affect non-tumor cells. Because Opdivo works with your immune system, it may cause your immune system to attack normal organs and tissues in your body and can affect the way they work. These problems may happen anytime during or even after your treatment has ended.   Administration - this is a one hour infusion - no premeds needed. This medication is given every 2 weeks.   Most common Adverse Reactions: fatigue, shortness of breath, pain in muscles, bones, & joints, decreased appetite, cough, nausea, & constipation.  Things you need to watch for and alert [place] immediately if it is happening:    New or worsening cough, chest pain, shortness of breath, diarrhea (or more loose stools than usual), blood in your stools or dark tarry sticky stools, severe stomach pain or tenderness, yellowing of your skin/eyes, severe nausea/vomiting, pain on the right side of abdomen, drowsiness, dark urine (tea colored), bleeding/bruising more easily than usual, decrease in the amount of urine, blood in your urine, swelling in your ankles, loss of appetite, headaches that will not go away or unusual headaches, extreme tiredness, weight gain or weight loss, changes in mood/behavior, dizziness/fainting, Hair loss, feeling cold, voice getting deeper.   You can go to [email or web address] to find patient  support resources or more information. Call 1-800-Opdivo-1 to contact a Care Navigator (in addition to [facility] staff). They are available by phone M-F 8am-8pm.   EDUCATIONAL MATERIALS GIVEN AND REVIEWED: Specific Instructions Sheets Opdivo     SYMPTOMS TO REPORT AS SOON AS POSSIBLE AFTER TREATMENT:  FEVER GREATER THAN 100.5 F  CHILLS WITH OR WITHOUT FEVER  NAUSEA AND VOMITING THAT IS NOT CONTROLLED WITH YOUR NAUSEA MEDICATION  UNUSUAL SHORTNESS OF BREATH  UNUSUAL BRUISING OR BLEEDING  TENDERNESS IN MOUTH AND THROAT WITH OR WITHOUT PRESENCE OF ULCERS  URINARY PROBLEMS  BOWEL PROBLEMS  UNUSUAL RASH    Wear comfortable clothing and clothing appropriate for easy access to any Portacath or PICC line. Let us know if there is anything that we can do to make your therapy better!      I have been informed and understand all of the instructions given to me and have received a copy. I have been instructed to call the clinic [phone] or my family physician as soon as possible for continued medical care, if indicated. I do not have any more questions at this time but understand that I may call the [facility] or the Patient Navigator at [phone] during office hours should I have questions or need assistance in obtaining follow-up care.      _________________________________________      _______________     __________ Signature of Patient or Authorized Representative        Date  Time      _________________________________________ Nurse's Signature      Nivolumab injection What is this medicine? NIVOLUMAB (nye VOL ue mab) is used to treat certain types of melanoma. This medicine may be used for other purposes; ask your health care provider or pharmacist if you have questions. COMMON BRAND NAME(S): Opdivo What should I tell my health care provider before I take this medicine? They need to know if you have any of  these conditions: -eye disease, vision problems -history of pancreatitis -immune system problems -inflammatory bowel disease -kidney disease -liver disease -lung disease -lupus -myasthenia gravis -multiple sclerosis -organ transplant -stomach or intestine problems -thyroid disease -tingling of the fingers or toes, or other nerve disorder -an unusual or allergic reaction to nivolumab, other medicines, foods, dyes, or preservatives -pregnant or trying to get pregnant -breast-feeding How should I use this medicine? This medicine is for infusion into a vein. It is given by a health care professional in a hospital or clinic setting. A special MedGuide will be given to you before each treatment. Be sure to read this information carefully each time. Talk to your pediatrician regarding the use of this medicine in children. Special care may be needed. Overdosage: If you think you've taken too much of this medicine contact a poison control center or emergency room at once. Overdosage: If you think you have taken too much of this medicine contact a poison control center or emergency room at once. NOTE: This medicine is only for you. Do not share this medicine with others. What if I miss a dose? It is important not to miss your dose. Call your doctor or health care professional if you are unable to keep an appointment. What may interact with this medicine? Interactions have not been studied. This list may not describe all possible interactions. Give your health care provider a list of all the medicines, herbs, non-prescription drugs, or dietary supplements you use. Also tell them if you smoke, drink alcohol, or use illegal drugs. Some items may interact with your medicine. What should I watch for while using this medicine? Tell your doctor or healthcare professional if your symptoms do not start to get better or if they get worse. Your condition will be monitored carefully while you are receiving  this medicine. You may need blood work done while you are taking this medicine. What side effects may I notice from receiving this medicine? Side effects that you should report to your doctor or health care professional as soon as possible: -allergic reactions like skin rash, itching or hives, swelling of the face, lips, or tongue -black, tarry stools -bloody or watery diarrhea -changes in vision -chills -cough -depressed mood -eye pain -feeling anxious -fever -general ill feeling or flu-like symptoms -hair loss -loss of appetite -pain, tingling, numbness in the hands or feet -redness, blistering, peeling or loosening of the skin, including inside the mouth -red pinpoint spots on skin -signs and symptoms of a dangerous change in heartbeat or heart rhythm like chest pain; dizziness; fast or irregular heartbeat; palpitations; feeling faint or lightheaded, falls; breathing problems -signs and symptoms of high blood sugar such as dizziness; dry mouth; dry skin; fruity breath; nausea; stomach pain; increased hunger or thirst; increased urination -signs and symptoms of kidney injury like trouble passing urine or change in the amount of urine -signs and symptoms of liver injury like dark yellow or brown urine; general ill feeling or flu-like symptoms; light-colored stools; loss of appetite; nausea; right upper belly pain;  unusually weak or tired; yellowing of the eyes or skin -swelling of the ankles, feet, hands -weight gain This list may not describe all possible side effects. Call your doctor for medical advice about side effects. You may report side effects to FDA at 1-800-FDA-1088. Where should I keep my medicine? This drug is given in a hospital or clinic and will not be stored at home. NOTE: This sheet is a summary. It may not cover all possible information. If you have questions about this medicine, talk to your doctor, pharmacist, or health care provider.  2015, [employer]. (2013-01-15 17:16:03)

## 2013-07-29 ENCOUNTER — Encounter (HOSPITAL_COMMUNITY): Payer: Managed Care, Other (non HMO)

## 2013-07-29 NOTE — Progress Notes (Signed)
Chemo teaching regarding Opdivo done via phone today with patient and his wife. Patient to start [place] on Wednesday. Consent to be signed when patient arrives for Opdivo on Wednesday. Pharmacy ordered drug today.

## 2013-07-30 ENCOUNTER — Telehealth (HOSPITAL_COMMUNITY): Payer: Self-pay | Admitting: Hematology and Oncology

## 2013-07-30 ENCOUNTER — Inpatient Hospital Stay (HOSPITAL_COMMUNITY): Payer: Managed Care, Other (non HMO)

## 2013-07-30 ENCOUNTER — Other Ambulatory Visit (HOSPITAL_COMMUNITY): Payer: Self-pay | Admitting: *Deleted

## 2013-07-30 DIAGNOSIS — C799 Secondary malignant neoplasm of unspecified site: Secondary | ICD-10-CM

## 2013-07-30 NOTE — Telephone Encounter (Signed)
[employer] PRE CERT [phone] [place] FOR OPDIVO/NIVOLUMBAM [id number] IDHW#[id number] 07/31/13-01/31/14

## 2013-07-31 ENCOUNTER — Ambulatory Visit (HOSPITAL_COMMUNITY): Payer: Managed Care, Other (non HMO) | Admitting: Oncology

## 2013-07-31 ENCOUNTER — Encounter (HOSPITAL_BASED_OUTPATIENT_CLINIC_OR_DEPARTMENT_OTHER): Payer: Managed Care, Other (non HMO)

## 2013-07-31 VITALS — BP 119/76 | HR 71 | Temp 97.6°F | Resp 20 | Wt 138.4 lb

## 2013-07-31 DIAGNOSIS — C3400 Malignant neoplasm of unspecified main bronchus: Secondary | ICD-10-CM

## 2013-07-31 DIAGNOSIS — C7800 Secondary malignant neoplasm of unspecified lung: Secondary | ICD-10-CM

## 2013-07-31 DIAGNOSIS — E039 Hypothyroidism, unspecified: Secondary | ICD-10-CM | POA: Diagnosis present

## 2013-07-31 DIAGNOSIS — C799 Secondary malignant neoplasm of unspecified site: Secondary | ICD-10-CM

## 2013-07-31 DIAGNOSIS — C801 Malignant (primary) neoplasm, unspecified: Secondary | ICD-10-CM | POA: Diagnosis present

## 2013-07-31 DIAGNOSIS — Z85118 Personal history of other malignant neoplasm of bronchus and lung: Secondary | ICD-10-CM

## 2013-07-31 DIAGNOSIS — R918 Other nonspecific abnormal finding of lung field: Secondary | ICD-10-CM | POA: Diagnosis present

## 2013-07-31 DIAGNOSIS — Z5112 Encounter for antineoplastic immunotherapy: Secondary | ICD-10-CM

## 2013-07-31 DIAGNOSIS — C3490 Malignant neoplasm of unspecified part of unspecified bronchus or lung: Secondary | ICD-10-CM | POA: Diagnosis present

## 2013-07-31 LAB — COMPREHENSIVE METABOLIC PANEL
ALK PHOS: 69 U/L (ref 39–117)
ALT: 8 U/L (ref 0–53)
AST: 13 U/L (ref 0–37)
Albumin: 3.6 g/dL (ref 3.5–5.2)
Anion gap: 10 (ref 5–15)
BILIRUBIN TOTAL: 0.2 mg/dL — AB (ref 0.3–1.2)
BUN: 27 mg/dL — ABNORMAL HIGH (ref 6–23)
CO2: 24 meq/L (ref 19–32)
CREATININE: 1.84 mg/dL — AB (ref 0.50–1.35)
Calcium: 8.8 mg/dL (ref 8.4–10.5)
Chloride: 105 mEq/L (ref 96–112)
GFR calc Af Amer: 39 mL/min — ABNORMAL LOW (ref >90)
GFR, EST NON AFRICAN AMERICAN: 34 mL/min — AB (ref >90)
Glucose, Bld: 96 mg/dL (ref 70–99)
POTASSIUM: 4.9 meq/L (ref 3.7–5.3)
Sodium: 139 mEq/L (ref 137–147)
Total Protein: 7.2 g/dL (ref 6.0–8.3)

## 2013-07-31 LAB — CBC WITH DIFFERENTIAL/PLATELET
BASOS ABS: 0.1 10*3/uL (ref 0.0–0.1)
Basophils Relative: 1 % (ref 0–1)
Eosinophils Absolute: 0.2 10*3/uL (ref 0.0–0.7)
Eosinophils Relative: 6 % — ABNORMAL HIGH (ref 0–5)
HEMATOCRIT: 32.8 % — AB (ref 39.0–52.0)
HEMOGLOBIN: 11.1 g/dL — AB (ref 13.0–17.0)
LYMPHS PCT: 15 % (ref 12–46)
Lymphs Abs: 0.5 10*3/uL — ABNORMAL LOW (ref 0.7–4.0)
MCH: 31.3 pg (ref 26.0–34.0)
MCHC: 33.8 g/dL (ref 30.0–36.0)
MCV: 92.4 fL (ref 78.0–100.0)
MONO ABS: 0.4 10*3/uL (ref 0.1–1.0)
Monocytes Relative: 10 % (ref 3–12)
NEUTROS PCT: 68 % (ref 43–77)
Neutro Abs: 2.4 10*3/uL (ref 1.7–7.7)
Platelets: 149 10*3/uL — ABNORMAL LOW (ref 150–400)
RBC: 3.55 MIL/uL — ABNORMAL LOW (ref 4.22–5.81)
RDW: 13.9 % (ref 11.5–15.5)
WBC: 3.6 10*3/uL — ABNORMAL LOW (ref 4.0–10.5)

## 2013-07-31 LAB — SEDIMENTATION RATE: SED RATE: 42 mm/h — AB (ref 0–16)

## 2013-07-31 LAB — TSH: TSH: 22.84 u[IU]/mL — ABNORMAL HIGH (ref 0.350–4.500)

## 2013-07-31 MED ORDER — SODIUM CHLORIDE 0.9 % IJ SOLN: 10.0000 mL | INTRAMUSCULAR | Status: DC | PRN

## 2013-07-31 MED ORDER — SODIUM CHLORIDE 0.9 % IV SOLN
Freq: Once | INTRAVENOUS | Status: AC
  Administered 2013-07-31: 10:00:00 via INTRAVENOUS

## 2013-07-31 MED ORDER — SODIUM CHLORIDE 0.9 % IV SOLN
3.0000 mg/kg | Freq: Once | INTRAVENOUS | Status: AC
  Administered 2013-07-31: 190 mg via INTRAVENOUS
  Filled 2013-07-31: qty 19

## 2013-07-31 MED ORDER — HEPARIN SOD (PORK) LOCK FLUSH 100 UNIT/ML IV SOLN
500.0000 [IU] | Freq: Once | INTRAVENOUS | Status: AC | PRN
Start: 2013-07-31 — End: 2013-07-31
  Administered 2013-07-31: 500 [IU]
  Filled 2013-07-31: qty 5

## 2013-07-31 NOTE — Progress Notes (Signed)
Consent signed prior to initiating Opdivo. Packet of information given to patient for wife to review later today. Patient instructed to review Opdivo checklist that I sent him home with and if any sign or symptom develops to call us here at the [facility].   Tolerated 1st infusion of Opdivo without any problems.

## 2013-08-01 ENCOUNTER — Ambulatory Visit (HOSPITAL_COMMUNITY): Payer: Managed Care, Other (non HMO) | Admitting: Oncology

## 2013-08-01 ENCOUNTER — Inpatient Hospital Stay (HOSPITAL_COMMUNITY): Payer: Managed Care, Other (non HMO)

## 2013-08-01 ENCOUNTER — Telehealth (HOSPITAL_COMMUNITY): Payer: Self-pay | Admitting: *Deleted

## 2013-08-01 NOTE — Telephone Encounter (Signed)
Patient tolerated first treatment with opdivo well. No problems noted except minor nausea and some itching on his back. Wife states she did not see any rash. Reminded of opdivo checklist and to report any symptoms that occur.

## 2013-08-13 ENCOUNTER — Inpatient Hospital Stay (HOSPITAL_COMMUNITY): Payer: Managed Care, Other (non HMO)

## 2013-08-13 ENCOUNTER — Ambulatory Visit (HOSPITAL_COMMUNITY): Payer: Managed Care, Other (non HMO) | Admitting: Oncology
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
## 2013-08-14 ENCOUNTER — Encounter (HOSPITAL_BASED_OUTPATIENT_CLINIC_OR_DEPARTMENT_OTHER): Payer: Managed Care, Other (non HMO) | Admitting: Oncology

## 2013-08-14 ENCOUNTER — Encounter (HOSPITAL_BASED_OUTPATIENT_CLINIC_OR_DEPARTMENT_OTHER): Payer: Managed Care, Other (non HMO)

## 2013-08-14 ENCOUNTER — Encounter (HOSPITAL_COMMUNITY): Payer: Self-pay | Admitting: Oncology

## 2013-08-14 VITALS — BP 112/66 | HR 70 | Temp 97.8°F | Resp 18

## 2013-08-14 VITALS — BP 114/69 | HR 72 | Temp 97.6°F | Resp 20 | Wt 138.0 lb

## 2013-08-14 DIAGNOSIS — C3490 Malignant neoplasm of unspecified part of unspecified bronchus or lung: Secondary | ICD-10-CM

## 2013-08-14 DIAGNOSIS — Z5112 Encounter for antineoplastic immunotherapy: Secondary | ICD-10-CM

## 2013-08-14 DIAGNOSIS — Z85118 Personal history of other malignant neoplasm of bronchus and lung: Secondary | ICD-10-CM

## 2013-08-14 DIAGNOSIS — E039 Hypothyroidism, unspecified: Secondary | ICD-10-CM

## 2013-08-14 DIAGNOSIS — C801 Malignant (primary) neoplasm, unspecified: Secondary | ICD-10-CM | POA: Diagnosis not present

## 2013-08-14 DIAGNOSIS — Z8521 Personal history of malignant neoplasm of larynx: Secondary | ICD-10-CM

## 2013-08-14 DIAGNOSIS — R918 Other nonspecific abnormal finding of lung field: Secondary | ICD-10-CM

## 2013-08-14 DIAGNOSIS — C799 Secondary malignant neoplasm of unspecified site: Secondary | ICD-10-CM

## 2013-08-14 DIAGNOSIS — C7800 Secondary malignant neoplasm of unspecified lung: Secondary | ICD-10-CM

## 2013-08-14 LAB — CBC WITH DIFFERENTIAL/PLATELET
BASOS ABS: 0.1 10*3/uL (ref 0.0–0.1)
BASOS PCT: 1 % (ref 0–1)
Eosinophils Absolute: 0.4 10*3/uL (ref 0.0–0.7)
Eosinophils Relative: 11 % — ABNORMAL HIGH (ref 0–5)
HCT: 33.6 % — ABNORMAL LOW (ref 39.0–52.0)
HEMOGLOBIN: 11.3 g/dL — AB (ref 13.0–17.0)
LYMPHS PCT: 14 % (ref 12–46)
Lymphs Abs: 0.6 10*3/uL — ABNORMAL LOW (ref 0.7–4.0)
MCH: 31.3 pg (ref 26.0–34.0)
MCHC: 33.6 g/dL (ref 30.0–36.0)
MCV: 93.1 fL (ref 78.0–100.0)
Monocytes Absolute: 0.4 10*3/uL (ref 0.1–1.0)
Monocytes Relative: 9 % (ref 3–12)
NEUTROS ABS: 2.5 10*3/uL (ref 1.7–7.7)
NEUTROS PCT: 65 % (ref 43–77)
Platelets: 145 10*3/uL — ABNORMAL LOW (ref 150–400)
RBC: 3.61 MIL/uL — ABNORMAL LOW (ref 4.22–5.81)
RDW: 14.1 % (ref 11.5–15.5)
WBC: 3.9 10*3/uL — ABNORMAL LOW (ref 4.0–10.5)

## 2013-08-14 LAB — COMPREHENSIVE METABOLIC PANEL
ALK PHOS: 70 U/L (ref 39–117)
ALT: 6 U/L (ref 0–53)
AST: 12 U/L (ref 0–37)
Albumin: 3.6 g/dL (ref 3.5–5.2)
Anion gap: 11 (ref 5–15)
BILIRUBIN TOTAL: 0.3 mg/dL (ref 0.3–1.2)
BUN: 24 mg/dL — AB (ref 6–23)
CHLORIDE: 103 meq/L (ref 96–112)
CO2: 24 meq/L (ref 19–32)
Calcium: 8.5 mg/dL (ref 8.4–10.5)
Creatinine, Ser: 1.81 mg/dL — ABNORMAL HIGH (ref 0.50–1.35)
GFR calc non Af Amer: 34 mL/min — ABNORMAL LOW (ref >90)
GFR, EST AFRICAN AMERICAN: 40 mL/min — AB (ref >90)
Glucose, Bld: 99 mg/dL (ref 70–99)
POTASSIUM: 4.9 meq/L (ref 3.7–5.3)
Sodium: 138 mEq/L (ref 137–147)
Total Protein: 7.2 g/dL (ref 6.0–8.3)

## 2013-08-14 LAB — TSH: TSH: 21.24 u[IU]/mL — AB (ref 0.350–4.500)

## 2013-08-14 LAB — LACTATE DEHYDROGENASE: LDH: 160 U/L (ref 94–250)

## 2013-08-14 LAB — SEDIMENTATION RATE: SED RATE: 38 mm/h — AB (ref 0–16)

## 2013-08-14 MED ORDER — HEPARIN SOD (PORK) LOCK FLUSH 100 UNIT/ML IV SOLN
500.0000 [IU] | Freq: Once | INTRAVENOUS | Status: AC | PRN
  Administered 2013-08-14: 500 [IU]
  Filled 2013-08-14: qty 5

## 2013-08-14 MED ORDER — NIVOLUMAB CHEMO INJECTION 100 MG/10ML
3.0000 mg/kg | Freq: Once | INTRAVENOUS | Status: AC
  Administered 2013-08-14: 190 mg via INTRAVENOUS
  Filled 2013-08-14: qty 19

## 2013-08-14 MED ORDER — SODIUM CHLORIDE 0.9 % IJ SOLN
10.0000 mL | INTRAMUSCULAR | Status: DC | PRN
  Administered 2013-08-14: 10 mL

## 2013-08-14 MED ORDER — SODIUM CHLORIDE 0.9 % IV SOLN
Freq: Once | INTRAVENOUS | Status: AC
  Administered 2013-08-14: 10:00:00 via INTRAVENOUS

## 2013-08-14 NOTE — Patient Instructions (Signed)
[facility] Discharge Instructions  RECOMMENDATIONS MADE BY THE CONSULTANT AND ANY TEST RESULTS WILL BE SENT TO YOUR REFERRING PHYSICIAN.  EXAM FINDINGS BY THE PHYSICIAN TODAY AND SIGNS OR SYMPTOMS TO REPORT TO CLINIC OR PRIMARY PHYSICIAN: Exam and findings as discussed by Robynn Pane, PA - C.  Will continue with treatment as planned.  Report fevers, chill, uncontrolled nausea, vomiting or other concerns.  MEDICATIONS PRESCRIBED:  none  INSTRUCTIONS/FOLLOW-UP: Follow-up as scheduled.  Thank you for choosing [facility] to provide your oncology and hematology care.  To afford each patient quality time with our providers, please arrive at least 15 minutes before your scheduled appointment time.  With your help, our goal is to use those 15 minutes to complete the necessary work-up to ensure our physicians have the information they need to help with your evaluation and healthcare recommendations.    Effective January 1st, 2014, we ask that you re-schedule your appointment with our physicians should you arrive 10 or more minutes late for your appointment.  We strive to give you quality time with our providers, and arriving late affects you and other patients whose appointments are after yours.    Again, thank you for choosing [facility].  Our hope is that these requests will decrease the amount of time that you wait before being seen by our physicians.       _____________________________________________________________  Should you have questions after your visit to [facility], please contact our office at [phone] between the hours of 8:30 a.m. and 4:30 p.m.  Voicemails left after 4:30 p.m. will not be returned until the following business day.  For prescription refill requests, have your pharmacy contact our office with your prescription refill request.    _______________________________________________________________  We  hope that we have given you very good care.  You may receive a patient satisfaction survey in the mail, please complete it and return it as soon as possible.  We value your feedback!  _______________________________________________________________  Have you asked about our STAR program?  STAR stands for Survivorship Training and Rehabilitation, and this is a nationally recognized cancer care program that focuses on survivorship and rehabilitation.  Cancer and cancer treatments may cause problems, such as, pain, making you feel tired and keeping you from doing the things that you need or want to do. Cancer rehabilitation can help. Our goal is to reduce these troubling effects and help you have the best quality of life possible.  You may receive a survey from a nurse that asks questions about your current state of health.  Based on the survey results, all eligible patients will be referred to the [facility] program for an evaluation so we can better serve you!  A frequently asked questions sheet is available upon request.

## 2013-08-14 NOTE — Progress Notes (Signed)
Tolerated well

## 2013-08-15 ENCOUNTER — Ambulatory Visit (HOSPITAL_COMMUNITY): Payer: Managed Care, Other (non HMO)

## 2013-08-15 ENCOUNTER — Inpatient Hospital Stay (HOSPITAL_COMMUNITY): Payer: Managed Care, Other (non HMO)

## 2013-08-15 MED ORDER — LEVOTHYROXINE SODIUM 50 MCG PO TABS: 50.0000 ug | ORAL_TABLET | Freq: Every day | ORAL | Status: DC

## 2013-08-15 NOTE — Addendum Note (Signed)
Addended by: Baird Cancer on: 08/15/2013 10:23 AM   Modules accepted: Orders, Level of Service

## 2013-08-21 ENCOUNTER — Other Ambulatory Visit: Payer: Self-pay | Admitting: Family Medicine

## 2013-08-23 ENCOUNTER — Telehealth: Payer: Self-pay | Admitting: Family Medicine

## 2013-08-23 MED ORDER — LORAZEPAM 1 MG PO TABS: 1.0000 mg | ORAL_TABLET | Freq: Every evening | ORAL | Status: DC | PRN

## 2013-08-23 NOTE — Telephone Encounter (Signed)
Rx faxed to pharmacy. Patient notified. 

## 2013-08-23 NOTE — Telephone Encounter (Signed)
May have one refill needs office visit with Dr. Richardson Landry

## 2013-08-23 NOTE — Telephone Encounter (Signed)
LORazepam (ATIVAN) 1 MG tablet  Pt states he needs to have this refilled  Last filled 07/22/13 Last visit 11/14

## 2013-08-26 ENCOUNTER — Other Ambulatory Visit: Payer: Self-pay

## 2013-08-26 MED ORDER — LORAZEPAM 1 MG PO TABS: 1.0000 mg | ORAL_TABLET | Freq: Every evening | ORAL | Status: DC | PRN

## 2013-08-27 ENCOUNTER — Ambulatory Visit (HOSPITAL_COMMUNITY): Payer: Managed Care, Other (non HMO)

## 2013-08-27 ENCOUNTER — Encounter: Payer: Self-pay | Admitting: Family Medicine

## 2013-08-27 ENCOUNTER — Inpatient Hospital Stay (HOSPITAL_COMMUNITY): Payer: Managed Care, Other (non HMO)

## 2013-08-27 ENCOUNTER — Ambulatory Visit (INDEPENDENT_AMBULATORY_CARE_PROVIDER_SITE_OTHER): Payer: Managed Care, Other (non HMO) | Admitting: Family Medicine

## 2013-08-27 VITALS — BP 138/90 | Ht 67.0 in | Wt 137.0 lb

## 2013-08-27 DIAGNOSIS — G4700 Insomnia, unspecified: Secondary | ICD-10-CM

## 2013-08-27 DIAGNOSIS — Z85118 Personal history of other malignant neoplasm of bronchus and lung: Secondary | ICD-10-CM

## 2013-08-27 DIAGNOSIS — J449 Chronic obstructive pulmonary disease, unspecified: Secondary | ICD-10-CM

## 2013-08-27 DIAGNOSIS — N400 Enlarged prostate without lower urinary tract symptoms: Secondary | ICD-10-CM

## 2013-08-27 MED ORDER — LISINOPRIL 5 MG PO TABS: ORAL_TABLET | ORAL | Status: DC

## 2013-08-27 MED ORDER — LORAZEPAM 1 MG PO TABS: 1.0000 mg | ORAL_TABLET | Freq: Every evening | ORAL | Status: DC | PRN

## 2013-08-27 NOTE — Progress Notes (Signed)
   Subjective:    Patient ID: Jeffrey [name], male    DOB: [birth year], 80 y.o.   MRN: [id number]  HPI Patient is here today for a check up.  Needs a refill on Ativan. States it definitely helps him for his insomnia.  Also on proton pump inhibitor. States needs this for reflux. Compliant with meds.  Pt says he is doing, "great".    No concerns.   Sticking with bp meds no obv side effects  Now on thyroid medicine, started by oncologist.  Followed by cancer specialist for lung cancer, unfortunately patient still smoking. Occasional shortness of breath with exertion. No true chest pain  Exercising regularly  Review of Systems No headache no back pain no abdominal pain no change about habits    Objective:   Physical Exam  126 82 on repeat Alert no acute distress. HEENT normal. Lungs clear but diminished breath sounds. Heart regular in rhythm. Abdomen benign.     Assessment & Plan:  Impression 1 insomnia discussed. #2 history of cardiomyopathy. Followed by cardiologist. Discussed #3 COPD discussed encouraged strongly to stop smoking. #4 lung cancer. Plan meds refilled. Diet exercise discussed. WSL

## 2013-08-28 ENCOUNTER — Encounter (HOSPITAL_COMMUNITY): Payer: Self-pay

## 2013-08-28 ENCOUNTER — Encounter (HOSPITAL_COMMUNITY): Payer: Managed Care, Other (non HMO) | Attending: Oncology

## 2013-08-28 ENCOUNTER — Encounter (HOSPITAL_BASED_OUTPATIENT_CLINIC_OR_DEPARTMENT_OTHER): Payer: Managed Care, Other (non HMO)

## 2013-08-28 ENCOUNTER — Ambulatory Visit (HOSPITAL_COMMUNITY): Payer: Managed Care, Other (non HMO) | Admitting: Oncology

## 2013-08-28 VITALS — BP 116/60 | HR 77 | Temp 97.3°F | Resp 18 | Wt 138.5 lb

## 2013-08-28 VITALS — BP 125/80 | HR 72 | Temp 97.9°F | Resp 18

## 2013-08-28 DIAGNOSIS — C3400 Malignant neoplasm of unspecified main bronchus: Secondary | ICD-10-CM

## 2013-08-28 DIAGNOSIS — Z85118 Personal history of other malignant neoplasm of bronchus and lung: Secondary | ICD-10-CM

## 2013-08-28 DIAGNOSIS — C801 Malignant (primary) neoplasm, unspecified: Secondary | ICD-10-CM | POA: Insufficient documentation

## 2013-08-28 DIAGNOSIS — C3490 Malignant neoplasm of unspecified part of unspecified bronchus or lung: Secondary | ICD-10-CM

## 2013-08-28 DIAGNOSIS — C799 Secondary malignant neoplasm of unspecified site: Secondary | ICD-10-CM

## 2013-08-28 DIAGNOSIS — C7800 Secondary malignant neoplasm of unspecified lung: Secondary | ICD-10-CM

## 2013-08-28 DIAGNOSIS — C3402 Malignant neoplasm of left main bronchus: Secondary | ICD-10-CM

## 2013-08-28 DIAGNOSIS — Z5112 Encounter for antineoplastic immunotherapy: Secondary | ICD-10-CM

## 2013-08-28 LAB — COMPREHENSIVE METABOLIC PANEL
ALK PHOS: 77 U/L (ref 39–117)
ALT: <5 U/L (ref 0–53)
ANION GAP: 12 (ref 5–15)
AST: 13 U/L (ref 0–37)
Albumin: 3.6 g/dL (ref 3.5–5.2)
BILIRUBIN TOTAL: 0.3 mg/dL (ref 0.3–1.2)
BUN: 28 mg/dL — AB (ref 6–23)
CHLORIDE: 103 meq/L (ref 96–112)
CO2: 24 meq/L (ref 19–32)
CREATININE: 1.72 mg/dL — AB (ref 0.50–1.35)
Calcium: 8.7 mg/dL (ref 8.4–10.5)
GFR, EST AFRICAN AMERICAN: 42 mL/min — AB (ref >90)
GFR, EST NON AFRICAN AMERICAN: 37 mL/min — AB (ref >90)
GLUCOSE: 106 mg/dL — AB (ref 70–99)
POTASSIUM: 4.6 meq/L (ref 3.7–5.3)
Sodium: 139 mEq/L (ref 137–147)
Total Protein: 7.4 g/dL (ref 6.0–8.3)

## 2013-08-28 LAB — SEDIMENTATION RATE: SED RATE: 40 mm/h — AB (ref 0–16)

## 2013-08-28 LAB — CBC WITH DIFFERENTIAL/PLATELET
Basophils Absolute: 0.1 10*3/uL (ref 0.0–0.1)
Basophils Relative: 1 % (ref 0–1)
Eosinophils Absolute: 0.6 10*3/uL (ref 0.0–0.7)
Eosinophils Relative: 13 % — ABNORMAL HIGH (ref 0–5)
HCT: 34 % — ABNORMAL LOW (ref 39.0–52.0)
HEMOGLOBIN: 11.5 g/dL — AB (ref 13.0–17.0)
LYMPHS ABS: 0.5 10*3/uL — AB (ref 0.7–4.0)
Lymphocytes Relative: 11 % — ABNORMAL LOW (ref 12–46)
MCH: 31.3 pg (ref 26.0–34.0)
MCHC: 33.8 g/dL (ref 30.0–36.0)
MCV: 92.6 fL (ref 78.0–100.0)
MONOS PCT: 7 % (ref 3–12)
Monocytes Absolute: 0.3 10*3/uL (ref 0.1–1.0)
NEUTROS ABS: 3.1 10*3/uL (ref 1.7–7.7)
NEUTROS PCT: 68 % (ref 43–77)
Platelets: 155 10*3/uL (ref 150–400)
RBC: 3.67 MIL/uL — ABNORMAL LOW (ref 4.22–5.81)
RDW: 14.1 % (ref 11.5–15.5)
WBC: 4.5 10*3/uL (ref 4.0–10.5)

## 2013-08-28 MED ORDER — HEPARIN SOD (PORK) LOCK FLUSH 100 UNIT/ML IV SOLN
INTRAVENOUS | Status: AC
  Filled 2013-08-28: qty 5

## 2013-08-28 MED ORDER — SODIUM CHLORIDE 0.9 % IJ SOLN
10.0000 mL | INTRAMUSCULAR | Status: DC | PRN
  Administered 2013-08-28: 10 mL

## 2013-08-28 MED ORDER — SODIUM CHLORIDE 0.9 % IV SOLN
Freq: Once | INTRAVENOUS | Status: AC
  Administered 2013-08-28: 12:00:00 via INTRAVENOUS

## 2013-08-28 MED ORDER — SODIUM CHLORIDE 0.9 % IV SOLN
3.0000 mg/kg | Freq: Once | INTRAVENOUS | Status: AC
  Administered 2013-08-28: 190 mg via INTRAVENOUS
  Filled 2013-08-28: qty 19

## 2013-08-28 MED ORDER — HEPARIN SOD (PORK) LOCK FLUSH 100 UNIT/ML IV SOLN
500.0000 [IU] | Freq: Once | INTRAVENOUS | Status: AC | PRN
  Administered 2013-08-28: 500 [IU]

## 2013-08-28 NOTE — Progress Notes (Signed)
Pt tolerated treatment well.

## 2013-08-28 NOTE — Patient Instructions (Signed)
[facility] Discharge Instructions  RECOMMENDATIONS MADE BY THE CONSULTANT AND ANY TEST RESULTS WILL BE SENT TO YOUR REFERRING PHYSICIAN.  EXAM FINDINGS BY THE PHYSICIAN TODAY AND SIGNS OR SYMPTOMS TO REPORT TO CLINIC OR PRIMARY PHYSICIAN: Exam and findings as discussed by Dr. Barnet Glasgow.  Will continue with therapy.  Report fevers, chills, uncontrolled nausea or vomiting.  MEDICATIONS PRESCRIBED:  none  INSTRUCTIONS/FOLLOW-UP: Follow-up in 2 weeks for chemotherapy and in 4 weeks for office visit and chemotherapy.  Thank you for choosing [facility] to provide your oncology and hematology care.  To afford each patient quality time with our providers, please arrive at least 15 minutes before your scheduled appointment time.  With your help, our goal is to use those 15 minutes to complete the necessary work-up to ensure our physicians have the information they need to help with your evaluation and healthcare recommendations.    Effective January 1st, 2014, we ask that you re-schedule your appointment with our physicians should you arrive 10 or more minutes late for your appointment.  We strive to give you quality time with our providers, and arriving late affects you and other patients whose appointments are after yours.    Again, thank you for choosing [facility].  Our hope is that these requests will decrease the amount of time that you wait before being seen by our physicians.       _____________________________________________________________  Should you have questions after your visit to [facility], please contact our office at [phone] between the hours of 8:30 a.m. and 4:30 p.m.  Voicemails left after 4:30 p.m. will not be returned until the following business day.  For prescription refill requests, have your pharmacy contact our office with your prescription refill request.     _______________________________________________________________  We hope that we have given you very good care.  You may receive a patient satisfaction survey in the mail, please complete it and return it as soon as possible.  We value your feedback!  _______________________________________________________________  Have you asked about our STAR program?  STAR stands for Survivorship Training and Rehabilitation, and this is a nationally recognized cancer care program that focuses on survivorship and rehabilitation.  Cancer and cancer treatments may cause problems, such as, pain, making you feel tired and keeping you from doing the things that you need or want to do. Cancer rehabilitation can help. Our goal is to reduce these troubling effects and help you have the best quality of life possible.  You may receive a survey from a nurse that asks questions about your current state of health.  Based on the survey results, all eligible patients will be referred to the [facility] program for an evaluation so we can better serve you!  A frequently asked questions sheet is available upon request.

## 2013-08-28 NOTE — Progress Notes (Signed)
[facility]  OFFICE PROGRESS NOTE  [name], MD [address] [place] [address]  DIAGNOSIS: No diagnosis found.  Chief Complaint  Patient presents with  . Lung Cancer    follow-up    CURRENT THERAPY: Nivolumab every 2 weeks started on 07/31/2013.  INTERVAL HISTORY: Jeffrey [name] 80 y.o. 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 From 07/31/2012 until 11/12/2012 patient received 6 cycles of carboplatin and Taxol because of enlargement of pulmonary nodules. He was placed on maintenance Tarceva on 12/10/2012 which was stopped after repeat CT scan on 05/01/2013 showed progression of disease. Patient not a candidate for SBRT. Therefore, he started Nivolumab on 07/31/2013. He is tolerating treatment well except for minor fatigue. Appetite is good with no nausea, vomiting, cough, expectoration, diarrhea, constipation, headache, blurred or double vision, lower extremity swelling or redness, joint pain, skin rash, headache, or seizures.   MEDICAL HISTORY: Past Medical History  Diagnosis Date  . Tracheostomy in place   . Cancer 2010    laryngeal  . Lung cancer 2010  . Pneumonia 2014  . Dysrhythmia     takes Coreg and Lisinopril daily  . Shortness of breath     with exertion  . Enlarged prostate     takes flomax  . Urinary urgency   . Gastric ulcer     hx of  . Hx of transfusion of packed red blood cells     no abnormal reaction noted by patient  . Umbilical hernia   . Metastatic cancer 07/24/2012  . Hyperlipidemia   . ED (erectile dysfunction)   . Insomnia   . CHF (congestive heart failure)     INTERIM HISTORY: has TOBACCO ABUSE; EAR PAIN, LEFT; C O P D; NECK PAIN, LEFT; POSTNASAL DRIP SYNDROME;  UNSPECIFIED RESPIRATORY ABNORMALITY; History of laryngeal cancer; History of lung cancer; Tracheostomy in place; Elevated brain natriuretic peptide (BNP) level; Pulmonary nodules; Bilateral hydronephrosis; Cardiomyopathy; Metastatic cancer; Prostate hypertrophy; and Insomnia on his problem list.   Metastatic cancer    02/17/2011  Imaging  PET scan- B/L hypermetabolic pulmonary nodules (3 in total) are most consistent with metastatic disease. No evidence of mediastinal nodal metastasis. No evidence of metastasis in the abdomen or pelvis.    06/19/2012  Imaging  CT scan of chest- Slight enlargement of pulmonary nodules/metastasis.    07/31/2012 - 11/12/2012  Chemotherapy  Carboplatin/Paclitaxel x 6 cycles    11/26/2012  Imaging  CT CAP- Similar size of a R apical pulm nodule. Similar to improved left lower lobe nodularity, favored to be post infectious or inflammatory. Similar nodularity in the right middle lobe. No evidence of new or progressive disease. Similar borderline adeno    12/10/2012 -  Chemotherapy  Tarceva 150 mg daily   Laryngeal biopsy and left upper lobectomy on 03/17/2008 and pathology showed;  1. LARYNX, SUPRAGLOTTIC, BIOPSY: - INVASIVE SQUAMOUS CELL CARCINOMA, SEE COMMENT.  2. LYMPH NODE, AP WINDOW, RESECTION: - NO TUMOR IDENTIFIED.  3. LYMPH NODE, LEFT UPPER LOBE, EXCISION: - NO TUMOR IDENTIFIED.  4. LEFT LUNG, UPPER LOBE, LOBECTOMY: - INVASIVE SQUAMOUS CELL CARCINOMA, 4.5 CM. - NEGATIVE SURGICAL MARGINS OF RESECTION.  Subsequently laryngectomy on 05/06/2008 showed;  1. LYMPH NODE, PRETRACHEAL, EXCISIONAL BIOPSY: ONE LYMPH  NODE, NEGATIVE FOR METASTATIC CARCINOMA (0/1)   2. LARYNX, LARYNGECTOMY: - INVASIVE MODERATELY DIFFERENTIATED SQUAMOUS CELL CARCINOMA, INVADING INTO CARTILAGE AND INVOLVING MUSCULAR TISSUE. - ALL RESECTIONS MARGINS ARE NEGATIVE - SEE ONCOLOGY   ALLERGIES:  has No Known Allergies.  MEDICATIONS: has a current medication list which includes the following  prescription(s): carvedilol, hydrocodone-acetaminophen, levothyroxine, lidocaine-prilocaine, lisinopril, lorazepam, terazosin, aspirin, bisacodyl, esomeprazole, naproxen sodium, and ondansetron, and the following Facility-Administered Medications: nivolumab (OPDIVO) 190 mg in sodium chloride 0.9 % 100 mL chemo infusion.  SURGICAL HISTORY:  Past Surgical History  Procedure Laterality Date  . Tracheostomy  2010  . Lung removal, partial  2010    left side  . Layngectomy for laryngeal cancer in2010; left upper lobectomy for lung cancer, 2010.    . Back surgery      thinks it was done in 2012  . Esophagogastroduodenoscopy    . Colonoscopy    . Portacath placement Bilateral 07/12/2012    Procedure: INSERTION PORT-A-CATH;  Surgeon: [name], MD;  Location: [facility] OR;  Service: Thoracic;  Laterality: Bilateral;    FAMILY HISTORY: family history includes Diabetes in his father; Heart disease in his father.  SOCIAL HISTORY:  reports that he has been smoking Cigarettes.  He has a 70 pack-year smoking history. He has never used smokeless tobacco. He reports that he does not drink alcohol or use illicit drugs.  REVIEW OF SYSTEMS:  Other than that discussed above is noncontributory.  PHYSICAL EXAMINATION: ECOG PERFORMANCE STATUS: 1 - Symptomatic but completely ambulatory  Blood pressure 116/60, pulse 77, temperature 97.3 F (36.3 C), temperature source Oral, resp. rate 18, weight 138 lb 8 oz (62.823 kg).  GENERAL:alert, no distress and comfortable SKIN: skin color, texture, turgor are normal, no rashes or significant lesions EYES: PERLA; Conjunctiva are pink and non-injected, sclera clear SINUSES: No redness or tenderness over maxillary or ethmoid sinuses OROPHARYNX:no exudate, no erythema on lips, buccal mucosa, or tongue. NECK: supple, thyroid normal size, non-tender, without nodularity. No masses. Tracheostomy in place with no evidence of purulence or bleeding. CHEST: Increased AP diameter  with light port in place. LYMPH:  no palpable lymphadenopathy in the cervical, axillary or inguinal LUNGS: clear to auscultation and percussion with normal breathing effort HEART: regular rate & rhythm and no murmurs. ABDOMEN:abdomen soft, non-tender and normal bowel sounds MUSCULOSKELETAL:no cyanosis of digits and no clubbing. Range of motion normal.  NEURO: alert & oriented x 3 with fluent speech, no focal motor/sensory deficits   LABORATORY DATA: Infusion on 08/28/2013  Component Date Value Ref Range Status  . WBC 08/28/2013 4.5  4.0 - 10.5 K/uL Final  . RBC 08/28/2013 3.67* 4.22 - 5.81 MIL/uL Final  . Hemoglobin 08/28/2013 11.5* 13.0 - 17.0 g/dL Final  . HCT 08/28/2013 34.0* 39.0 - 52.0 % Final  . MCV 08/28/2013 92.6  78.0 - 100.0 fL Final  . MCH 08/28/2013 31.3  26.0 - 34.0 pg Final  . MCHC 08/28/2013 33.8  30.0 - 36.0 g/dL Final  . RDW 08/28/2013 14.1  11.5 - 15.5 % Final  . Platelets 08/28/2013 155  150 - 400 K/uL Final  . Neutrophils Relative % 08/28/2013 68  43 - 77 % Final  . Neutro Abs 08/28/2013 3.1  1.7 - 7.7 K/uL Final  . Lymphocytes Relative 08/28/2013 11* 12 - 46 % Final  . Lymphs Abs 08/28/2013 0.5* 0.7 - 4.0 K/uL Final  . Monocytes Relative 08/28/2013 7  3 - 12 % Final  . Monocytes Absolute 08/28/2013 0.3  0.1 - 1.0 K/uL Final  . Eosinophils Relative 08/28/2013 13* 0 - 5 % Final  . Eosinophils Absolute 08/28/2013 0.6  0.0 - 0.7 K/uL Final  . Basophils Relative 08/28/2013 1  0 - 1 % Final  . Basophils Absolute 08/28/2013 0.1  0.0 - 0.1 K/uL Final  . Sodium 08/28/2013 139  137 - 147 mEq/L Final  . Potassium 08/28/2013 4.6  3.7 - 5.3 mEq/L Final  . Chloride 08/28/2013 103  96 - 112 mEq/L Final  . CO2 08/28/2013 24  19 - 32 mEq/L Final  . Glucose, Bld 08/28/2013 106* 70 - 99 mg/dL Final  . BUN 08/28/2013 28* 6 - 23 mg/dL Final  . Creatinine, Ser 08/28/2013 1.72* 0.50 - 1.35 mg/dL Final  . Calcium 08/28/2013 8.7  8.4 - 10.5 mg/dL Final  . Total Protein 08/28/2013  7.4  6.0 - 8.3 g/dL Final  . Albumin 08/28/2013 3.6  3.5 - 5.2 g/dL Final  . AST 08/28/2013 13  0 - 37 U/L Final  . ALT 08/28/2013 <5  0 - 53 U/L Final  . Alkaline Phosphatase 08/28/2013 77  39 - 117 U/L Final  . Total Bilirubin 08/28/2013 0.3  0.3 - 1.2 mg/dL Final  . GFR calc non Af Amer 08/28/2013 37* >90 mL/min Final  . GFR calc Af Amer 08/28/2013 42* >90 mL/min Final   Comment: (NOTE)                          The eGFR has been calculated using the CKD EPI equation.                          This calculation has not been validated in all clinical situations.                          eGFR's persistently <90 mL/min signify possible Chronic Kidney                          Disease.  . Anion gap 08/28/2013 12  5 - 15 Final  . Sed Rate 08/28/2013 40* 0 - 16 mm/hr Final  Office Visit on 08/14/2013  Component Date Value Ref Range Status  . TSH 08/14/2013 21.240* 0.350 - 4.500 uIU/mL Final   Performed at [facility]  Infusion on 08/14/2013  Component Date Value Ref Range Status  . Sodium 08/14/2013 138  137 - 147 mEq/L Final  . Potassium 08/14/2013 4.9  3.7 - 5.3 mEq/L Final  . Chloride 08/14/2013 103  96 - 112 mEq/L Final  . CO2 08/14/2013 24  19 - 32 mEq/L Final  . Glucose, Bld 08/14/2013 99  70 - 99 mg/dL Final  . BUN 08/14/2013 24* 6 - 23 mg/dL Final  . Creatinine, Ser 08/14/2013 1.81* 0.50 - 1.35 mg/dL Final  . Calcium 08/14/2013 8.5  8.4 - 10.5 mg/dL Final  . Total Protein 08/14/2013 7.2  6.0 - 8.3 g/dL Final  . Albumin 08/14/2013 3.6  3.5 - 5.2 g/dL Final  . AST 08/14/2013 12  0 - 37 U/L Final  . ALT 08/14/2013 6  0 - 53 U/L Final  . Alkaline Phosphatase 08/14/2013 70  39 - 117 U/L Final  . Total Bilirubin 08/14/2013 0.3  0.3 - 1.2 mg/dL Final  . GFR calc non Af Amer 08/14/2013 34* >90 mL/min Final  . GFR calc  Af Amer 08/14/2013 40* >90 mL/min Final   Comment: (NOTE)                          The eGFR has been calculated using the CKD EPI equation.                           This calculation has not been validated in all clinical situations.                          eGFR's persistently <90 mL/min signify possible Chronic Kidney                          Disease.  . Anion gap 08/14/2013 11  5 - 15 Final  . Sed Rate 08/14/2013 38* 0 - 16 mm/hr Final  . WBC 08/14/2013 3.9* 4.0 - 10.5 K/uL Final  . RBC 08/14/2013 3.61* 4.22 - 5.81 MIL/uL Final  . Hemoglobin 08/14/2013 11.3* 13.0 - 17.0 g/dL Final  . HCT 08/14/2013 33.6* 39.0 - 52.0 % Final  . MCV 08/14/2013 93.1  78.0 - 100.0 fL Final  . MCH 08/14/2013 31.3  26.0 - 34.0 pg Final  . MCHC 08/14/2013 33.6  30.0 - 36.0 g/dL Final  . RDW 08/14/2013 14.1  11.5 - 15.5 % Final  . Platelets 08/14/2013 145* 150 - 400 K/uL Final  . Neutrophils Relative % 08/14/2013 65  43 - 77 % Final  . Neutro Abs 08/14/2013 2.5  1.7 - 7.7 K/uL Final  . Lymphocytes Relative 08/14/2013 14  12 - 46 % Final  . Lymphs Abs 08/14/2013 0.6* 0.7 - 4.0 K/uL Final  . Monocytes Relative 08/14/2013 9  3 - 12 % Final  . Monocytes Absolute 08/14/2013 0.4  0.1 - 1.0 K/uL Final  . Eosinophils Relative 08/14/2013 11* 0 - 5 % Final  . Eosinophils Absolute 08/14/2013 0.4  0.0 - 0.7 K/uL Final  . Basophils Relative 08/14/2013 1  0 - 1 % Final  . Basophils Absolute 08/14/2013 0.1  0.0 - 0.1 K/uL Final  . LDH 08/14/2013 160  94 - 250 U/L Final  Infusion on 07/31/2013  Component Date Value Ref Range Status  . WBC 07/31/2013 3.6* 4.0 - 10.5 K/uL Final  . RBC 07/31/2013 3.55* 4.22 - 5.81 MIL/uL Final  . Hemoglobin 07/31/2013 11.1* 13.0 - 17.0 g/dL Final  . HCT 07/31/2013 32.8* 39.0 - 52.0 % Final  . MCV 07/31/2013 92.4  78.0 - 100.0 fL Final  . MCH 07/31/2013 31.3  26.0 - 34.0 pg Final  . MCHC 07/31/2013 33.8  30.0 - 36.0 g/dL Final  . RDW 07/31/2013 13.9  11.5 - 15.5 % Final  . Platelets 07/31/2013 149* 150 - 400 K/uL Final  . Neutrophils Relative % 07/31/2013 68  43 - 77 % Final  . Neutro Abs 07/31/2013 2.4  1.7 - 7.7 K/uL Final  . Lymphocytes  Relative 07/31/2013 15  12 - 46 % Final  . Lymphs Abs 07/31/2013 0.5* 0.7 - 4.0 K/uL Final  . Monocytes Relative 07/31/2013 10  3 - 12 % Final  . Monocytes Absolute 07/31/2013 0.4  0.1 - 1.0 K/uL Final  . Eosinophils Relative 07/31/2013 6* 0 - 5 % Final  . Eosinophils Absolute 07/31/2013 0.2  0.0 - 0.7 K/uL Final  . Basophils Relative 07/31/2013 1  0 - 1 % Final  .  Basophils Absolute 07/31/2013 0.1  0.0 - 0.1 K/uL Final  . Sodium 07/31/2013 139  137 - 147 mEq/L Final  . Potassium 07/31/2013 4.9  3.7 - 5.3 mEq/L Final  . Chloride 07/31/2013 105  96 - 112 mEq/L Final  . CO2 07/31/2013 24  19 - 32 mEq/L Final  . Glucose, Bld 07/31/2013 96  70 - 99 mg/dL Final  . BUN 07/31/2013 27* 6 - 23 mg/dL Final  . Creatinine, Ser 07/31/2013 1.84* 0.50 - 1.35 mg/dL Final  . Calcium 07/31/2013 8.8  8.4 - 10.5 mg/dL Final  . Total Protein 07/31/2013 7.2  6.0 - 8.3 g/dL Final  . Albumin 07/31/2013 3.6  3.5 - 5.2 g/dL Final  . AST 07/31/2013 13  0 - 37 U/L Final  . ALT 07/31/2013 8  0 - 53 U/L Final  . Alkaline Phosphatase 07/31/2013 69  39 - 117 U/L Final  . Total Bilirubin 07/31/2013 0.2* 0.3 - 1.2 mg/dL Final  . GFR calc non Af Amer 07/31/2013 34* >90 mL/min Final  . GFR calc Af Amer 07/31/2013 39* >90 mL/min Final   Comment: (NOTE)                          The eGFR has been calculated using the CKD EPI equation.                          This calculation has not been validated in all clinical situations.                          eGFR's persistently <90 mL/min signify possible Chronic Kidney                          Disease.  . Anion gap 07/31/2013 10  5 - 15 Final  . Sed Rate 07/31/2013 42* 0 - 16 mm/hr Final  . TSH 07/31/2013 22.840* 0.350 - 4.500 uIU/mL Final   Performed at [place]: No new pathology.  Urinalysis    Component Value Date/Time   COLORURINE YELLOW 01/24/2012 1535   APPEARANCEUR CLEAR 01/24/2012 1535   LABSPEC 1.015 01/24/2012 1535   PHURINE 5.5 01/24/2012  1535   GLUCOSEU NEGATIVE 01/24/2012 1535   HGBUR NEGATIVE 01/24/2012 1535   BILIRUBINUR NEGATIVE 01/24/2012 1535   KETONESUR NEGATIVE 01/24/2012 1535   PROTEINUR TRACE* 01/24/2012 1535   UROBILINOGEN 0.2 01/24/2012 1535   NITRITE NEGATIVE 01/24/2012 1535   LEUKOCYTESUR NEGATIVE 01/24/2012 1535    RADIOGRAPHIC STUDIES: No results found.  ASSESSMENT:  1. 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 From 07/31/2012 until 11/12/2012 patient received 6 cycles of carboplatin and Taxol because of enlargement of pulmonary nodules. He was placed on maintenance Tarceva on 12/10/2012 which was stopped after repeat CT scan on 05/01/2013 showed progression of disease. Patient not a candidate for SBRT. Therefore, he started Nivolumab on 07/31/2013, tolerating well. 2. Cardiomyopathy, stable, without symptoms or signs of heart failure or dysrhythmia.  3. 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 Chronic obstructive pulmonary disease, no longer smoking.     PLAN:  1. Nivolumab 3 mg per kilogram IV and continue every 2 weeks. 2. Monthly CBC,  chem profile, TSH, LDH   All questions were answered. The patient knows to call the clinic with any problems, questions or concerns. We can certainly see the patient much sooner if necessary.   I spent 25 minutes counseling the patient face to face. The total time spent in the appointment was 30 minutes.    Doroteo Bradford, MD 08/28/2013 12:47 PM  DISCLAIMER:  This note was dictated with voice recognition software.  Similar sounding words can inadvertently be transcribed inaccurately and may not be corrected upon review.

## 2013-08-29 ENCOUNTER — Inpatient Hospital Stay (HOSPITAL_COMMUNITY): Payer: Managed Care, Other (non HMO)

## 2013-08-29 ENCOUNTER — Ambulatory Visit (HOSPITAL_COMMUNITY): Payer: Managed Care, Other (non HMO) | Admitting: Oncology

## 2013-09-10 ENCOUNTER — Ambulatory Visit (HOSPITAL_COMMUNITY): Payer: Managed Care, Other (non HMO)

## 2013-09-10 ENCOUNTER — Inpatient Hospital Stay (HOSPITAL_COMMUNITY): Payer: Managed Care, Other (non HMO)

## 2013-09-11 ENCOUNTER — Other Ambulatory Visit (HOSPITAL_COMMUNITY): Payer: Self-pay | Admitting: Oncology

## 2013-09-11 ENCOUNTER — Ambulatory Visit (HOSPITAL_COMMUNITY): Payer: Managed Care, Other (non HMO)

## 2013-09-11 ENCOUNTER — Encounter (HOSPITAL_BASED_OUTPATIENT_CLINIC_OR_DEPARTMENT_OTHER): Payer: Managed Care, Other (non HMO)

## 2013-09-11 ENCOUNTER — Encounter: Payer: Self-pay | Admitting: *Deleted

## 2013-09-11 VITALS — BP 118/69 | HR 73 | Temp 98.6°F | Resp 18 | Wt 141.6 lb

## 2013-09-11 DIAGNOSIS — C801 Malignant (primary) neoplasm, unspecified: Secondary | ICD-10-CM | POA: Diagnosis not present

## 2013-09-11 DIAGNOSIS — C799 Secondary malignant neoplasm of unspecified site: Secondary | ICD-10-CM

## 2013-09-11 DIAGNOSIS — C3490 Malignant neoplasm of unspecified part of unspecified bronchus or lung: Secondary | ICD-10-CM

## 2013-09-11 DIAGNOSIS — C7800 Secondary malignant neoplasm of unspecified lung: Secondary | ICD-10-CM

## 2013-09-11 DIAGNOSIS — Z5112 Encounter for antineoplastic immunotherapy: Secondary | ICD-10-CM

## 2013-09-11 DIAGNOSIS — Z85118 Personal history of other malignant neoplasm of bronchus and lung: Secondary | ICD-10-CM

## 2013-09-11 DIAGNOSIS — J069 Acute upper respiratory infection, unspecified: Secondary | ICD-10-CM

## 2013-09-11 LAB — COMPREHENSIVE METABOLIC PANEL
ALBUMIN: 3.5 g/dL (ref 3.5–5.2)
ALK PHOS: 73 U/L (ref 39–117)
ALT: 7 U/L (ref 0–53)
ANION GAP: 11 (ref 5–15)
AST: 14 U/L (ref 0–37)
BUN: 21 mg/dL (ref 6–23)
CALCIUM: 8.7 mg/dL (ref 8.4–10.5)
CO2: 24 mEq/L (ref 19–32)
Chloride: 105 mEq/L (ref 96–112)
Creatinine, Ser: 1.61 mg/dL — ABNORMAL HIGH (ref 0.50–1.35)
GFR calc non Af Amer: 40 mL/min — ABNORMAL LOW (ref >90)
GFR, EST AFRICAN AMERICAN: 46 mL/min — AB (ref >90)
GLUCOSE: 104 mg/dL — AB (ref 70–99)
POTASSIUM: 4.9 meq/L (ref 3.7–5.3)
Sodium: 140 mEq/L (ref 137–147)
TOTAL PROTEIN: 7.4 g/dL (ref 6.0–8.3)
Total Bilirubin: 0.3 mg/dL (ref 0.3–1.2)

## 2013-09-11 LAB — CBC WITH DIFFERENTIAL/PLATELET
BASOS ABS: 0.1 10*3/uL (ref 0.0–0.1)
Basophils Relative: 1 % (ref 0–1)
Eosinophils Absolute: 0.7 10*3/uL (ref 0.0–0.7)
Eosinophils Relative: 12 % — ABNORMAL HIGH (ref 0–5)
HCT: 33.8 % — ABNORMAL LOW (ref 39.0–52.0)
Hemoglobin: 11.5 g/dL — ABNORMAL LOW (ref 13.0–17.0)
LYMPHS PCT: 10 % — AB (ref 12–46)
Lymphs Abs: 0.5 10*3/uL — ABNORMAL LOW (ref 0.7–4.0)
MCH: 31.6 pg (ref 26.0–34.0)
MCHC: 34 g/dL (ref 30.0–36.0)
MCV: 92.9 fL (ref 78.0–100.0)
Monocytes Absolute: 0.5 10*3/uL (ref 0.1–1.0)
Monocytes Relative: 8 % (ref 3–12)
Neutro Abs: 3.9 10*3/uL (ref 1.7–7.7)
Neutrophils Relative %: 69 % (ref 43–77)
PLATELETS: 161 10*3/uL (ref 150–400)
RBC: 3.64 MIL/uL — AB (ref 4.22–5.81)
RDW: 14.1 % (ref 11.5–15.5)
WBC: 5.6 10*3/uL (ref 4.0–10.5)

## 2013-09-11 LAB — TSH: TSH: 7.96 u[IU]/mL — AB (ref 0.350–4.500)

## 2013-09-11 LAB — SEDIMENTATION RATE: Sed Rate: 30 mm/hr — ABNORMAL HIGH (ref 0–16)

## 2013-09-11 MED ORDER — AZITHROMYCIN 250 MG PO TABS: ORAL_TABLET | ORAL | Status: AC

## 2013-09-11 MED ORDER — HYDROCODONE-ACETAMINOPHEN 5-500 MG PO TABS: 0.5000 | ORAL_TABLET | Freq: Three times a day (TID) | ORAL | Status: DC | PRN

## 2013-09-11 MED ORDER — SODIUM CHLORIDE 0.9 % IV SOLN
Freq: Once | INTRAVENOUS | Status: AC
  Administered 2013-09-11: 10:00:00 via INTRAVENOUS

## 2013-09-11 MED ORDER — SODIUM CHLORIDE 0.9 % IJ SOLN: 10.0000 mL | INTRAMUSCULAR | Status: DC | PRN

## 2013-09-11 MED ORDER — SODIUM CHLORIDE 0.9 % IV SOLN
3.0000 mg/kg | Freq: Once | INTRAVENOUS | Status: AC
  Administered 2013-09-11: 190 mg via INTRAVENOUS
  Filled 2013-09-11: qty 19

## 2013-09-11 MED ORDER — HEPARIN SOD (PORK) LOCK FLUSH 100 UNIT/ML IV SOLN
500.0000 [IU] | Freq: Once | INTRAVENOUS | Status: AC | PRN
  Administered 2013-09-11: 500 [IU]
  Filled 2013-09-11: qty 5

## 2013-09-11 NOTE — Progress Notes (Signed)
[facility] Clinical Social Work  Clinical Social Work met briefly with pt in the infusion room to check in and introduce self.   Clinical Social Worker explained role of CSW and assistance available. Pt denied current concerns, but agrees to reach out to CSW if future needs arise.   Clinical Social Work interventions: Education about role of Jeffrey [name], Programme researcher, broadcasting/film/video

## 2013-09-11 NOTE — Progress Notes (Signed)
Patient reported a cough, w/ yellow sputum for several days.  PA Kefalas notified, Z-Pack script sent to pharmacy.   Patient tolerated treatment well. Ambulated out.

## 2013-09-12 ENCOUNTER — Other Ambulatory Visit (HOSPITAL_COMMUNITY): Payer: Self-pay | Admitting: Oncology

## 2013-09-12 ENCOUNTER — Telehealth (HOSPITAL_COMMUNITY): Payer: Self-pay | Admitting: Emergency Medicine

## 2013-09-12 ENCOUNTER — Inpatient Hospital Stay (HOSPITAL_COMMUNITY): Payer: Managed Care, Other (non HMO)

## 2013-09-12 DIAGNOSIS — C799 Secondary malignant neoplasm of unspecified site: Secondary | ICD-10-CM

## 2013-09-12 MED ORDER — HYDROCODONE-ACETAMINOPHEN 5-325 MG PO TABS: 0.5000 | ORAL_TABLET | Freq: Four times a day (QID) | ORAL | Status: DC | PRN

## 2013-09-12 NOTE — Telephone Encounter (Signed)
Fixed.

## 2013-09-12 NOTE — Telephone Encounter (Signed)
[facility] called to verify vicodin script.  No longer available iin 5/500 mg strength.  Kirby Crigler notified.

## 2013-09-23 NOTE — Progress Notes (Signed)
Jeffrey Battiest, MD [place] [address]  Metastatic cancer - Plan: CT Abdomen Pelvis Wo Contrast, CT Chest Wo Contrast, LORTAB 5-325 MG per tablet  CURRENT THERAPY: Nivolumab every 2 weeks started on 07/31/2013.  INTERVAL HISTORY: Jeffrey [name] 80 y.o. 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 From 07/31/2012 until 11/12/2012 patient received 6 cycles of carboplatin and Taxol because of enlargement of pulmonary nodules. He was placed on maintenance Tarceva on 12/10/2012 which was stopped after repeat CT scan on 05/01/2013 showed progression of disease. Patient not a candidate for SBRT. Therefore, he started Nivolumab on 07/31/2013.    Metastatic cancer   02/17/2011 Imaging PET scan- B/L hypermetabolic pulmonary nodules (3 in total) are most consistent with metastatic disease. No evidence of mediastinal nodal metastasis.  No evidence of metastasis in the abdomen or pelvis.   06/19/2012 Imaging CT scan of chest- Slight enlargement of pulmonary nodules/metastasis.    07/31/2012 - 11/12/2012 Chemotherapy Carboplatin/Paclitaxel x 6 cycles   11/26/2012 Imaging CT CAP- Similar size of a R apical pulm nodule.  Similar to improved left lower lobe nodularity, favored to be post infectious or inflammatory. Similar nodularity in the right middle lobe. No evidence of new or progressive disease. Similar borderline adeno    12/10/2012 - 05/07/2013 Chemotherapy Tarceva 150 mg daily   05/03/2013 Progression CT CAP- Interval increase in size of right upper lobe adjacent pulmonary nodules is highly concerning for progression of lung cancer recurrence.   07/31/2013 -  Chemotherapy Nivolumab   I personally reviewed and went over laboratory results with  the patient.  The results are noted within this dictation.  He reports that he feels great.  His fatigue is much improved as this is likely due to the alteration in his Synthroid dose and the improvement in his TSH level.  The patient's mother-in-law injected her own questions about her Synthroid dose.  I have deferred to her primary care provider.  On our last encounter, he requested a refill on his 5-500 mg Lortab.  Since they do not make that dose anymore, I wrote for 5-325 mg.  He reports that he was given generic Lortab.  He notes that the generic form does not work nearly as well for him as the brand name.  Therefore, he requested a new Rx for the brand name.  A new Rx was therefore written.  He notes that he take 1/4th of a pill when needed instead of a whole pill.  He admits that it works.  He reports that when he work, he experiences bilateral knee pain.  This is well controlled with pain medications.  Oncologically, he feels great, denies any complaints, and ROS questioning is negative.  Past Medical History  Diagnosis Date  . Tracheostomy in place   . Cancer 2010    laryngeal  . Lung cancer 2010  . Pneumonia 2014  . Dysrhythmia     takes Coreg and Lisinopril daily  . Shortness of breath     with exertion  . Enlarged prostate     takes flomax  . Urinary urgency   . Gastric ulcer     hx of  . Hx of transfusion of packed red blood cells     no  abnormal reaction noted by patient  . Umbilical hernia   . Metastatic cancer 07/24/2012  . Hyperlipidemia   . ED (erectile dysfunction)   . Insomnia   . CHF (congestive heart failure)     has TOBACCO ABUSE; EAR PAIN, LEFT; C O P D; NECK PAIN, LEFT; POSTNASAL DRIP SYNDROME; UNSPECIFIED RESPIRATORY ABNORMALITY; History of laryngeal cancer; History of lung cancer; Tracheostomy in place; Elevated brain natriuretic peptide (BNP) level; Pulmonary nodules; Bilateral hydronephrosis; Cardiomyopathy; Metastatic cancer; Prostate hypertrophy; and  Insomnia on his problem list.     has No Known Allergies.  Jeffrey [name] had no medications administered during this visit.  Past Surgical History  Procedure Laterality Date  . Tracheostomy  2010  . Lung removal, partial  2010    left side  . Layngectomy for laryngeal cancer in2010; left upper lobectomy for lung cancer, 2010.    . Back surgery      thinks it was done in 2012  . Esophagogastroduodenoscopy    . Colonoscopy    . Portacath placement Bilateral 07/12/2012    Procedure: INSERTION PORT-A-CATH;  Surgeon: [name], MD;  Location: [facility] OR;  Service: Thoracic;  Laterality: Bilateral;    Denies any headaches, dizziness, double vision, fevers, chills, night sweats, nausea, vomiting, diarrhea, constipation, chest pain, heart palpitations, shortness of breath, blood in stool, black tarry stool, urinary pain, urinary burning, urinary frequency, hematuria.   PHYSICAL EXAMINATION  ECOG PERFORMANCE STATUS: 0 - Asymptomatic  There were no vitals filed for this visit.  GENERAL:alert, no distress, well nourished, well developed, comfortable, cooperative and smiling SKIN: skin color, texture, turgor are normal, no rashes or significant lesions HEAD: Normocephalic, No masses, lesions, tenderness or abnormalities EYES: normal, PERRLA, EOMI, Conjunctiva are pink and non-injected EARS: External ears normal OROPHARYNX:mucous membranes are moist  NECK: supple, no adenopathy, thyroid normal size, non-tender, without nodularity, no stridor, non-tender, trachea midline LYMPH:  not examined BREAST:not examined LUNGS: not examined HEART: not examined ABDOMEN:normal bowel sounds BACK: Back symmetric, no curvature. EXTREMITIES:less then 2 second capillary refill, no joint deformities, effusion, or inflammation, no skin discoloration, no cyanosis  NEURO: alert & oriented x 3 with fluent speech, no focal motor/sensory deficits, gait normal   LABORATORY DATA: CBC    Component Value  Date/Time   WBC 5.6 09/11/2013 0901   RBC 3.64* 09/11/2013 0901   HGB 11.5* 09/11/2013 0901   HCT 33.8* 09/11/2013 0901   PLT 161 09/11/2013 0901   MCV 92.9 09/11/2013 0901   MCH 31.6 09/11/2013 0901   MCHC 34.0 09/11/2013 0901   RDW 14.1 09/11/2013 0901   LYMPHSABS 0.5* 09/11/2013 0901   MONOABS 0.5 09/11/2013 0901   EOSABS 0.7 09/11/2013 0901   BASOSABS 0.1 09/11/2013 0901      Chemistry      Component Value Date/Time   NA 140 09/11/2013 0901   K 4.9 09/11/2013 0901   CL 105 09/11/2013 0901   CO2 24 09/11/2013 0901   BUN 21 09/11/2013 0901   CREATININE 1.61* 09/11/2013 0901   CREATININE 1.46* 11/12/2012 0839      Component Value Date/Time   CALCIUM 8.7 09/11/2013 0901   CALCIUM 8.5 01/26/2012 1312   ALKPHOS 73 09/11/2013 0901   AST 14 09/11/2013 0901   ALT 7 09/11/2013 0901   BILITOT 0.3 09/11/2013 0901         ASSESSMENT:  1. Progressive metastatic squamous cell carcinoma lung with lung metastases, status post left upper lobe nodule resection 4.5 cm performed on 03/17/2008  along with resection of a supraglottic larynx mass at the same time, no lymph node involvement. From 07/31/2012 until 11/12/2012 patient received 6 cycles of carboplatin and Taxol because of enlargement of pulmonary nodules. He was placed on maintenance Tarceva on 12/10/2012 which was stopped after repeat CT scan on 05/01/2013 showed progression of disease. Patient not a candidate for SBRT. Therefore, he started Nivolumab on 07/31/2013, tolerating well.  2. Cardiomyopathy, stable, without symptoms or signs of heart failure or dysrhythmia.  3. 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 Chronic obstructive pulmonary disease, no longer smoking.   Patient Active Problem List   Diagnosis Date Noted  . Prostate hypertrophy 11/21/2012  . Insomnia 11/21/2012  . Metastatic cancer 07/24/2012  . Cardiomyopathy 01/27/2012  . History of laryngeal cancer 01/26/2012  . History of lung cancer 01/26/2012  . Tracheostomy in place  01/26/2012  . Elevated brain natriuretic peptide (BNP) level 01/26/2012  . Pulmonary nodules 01/26/2012  . Bilateral hydronephrosis 01/26/2012  . TOBACCO ABUSE 02/28/2008  . EAR PAIN, LEFT 02/28/2008  . C O P D 02/28/2008  . NECK PAIN, LEFT 02/28/2008  . POSTNASAL DRIP SYNDROME 02/28/2008  . UNSPECIFIED RESPIRATORY ABNORMALITY 02/28/2008     PLAN:  1. I personally reviewed and went over laboratory results with the patient.  The results are noted within this dictation. 2. Nivolumab 3 mg/kg IV every 2 weeks as planned 3. Labs every 4 weeks: CBC diff, CMET, TSH, LDH 4. CT CAP wo contrast in 4 weeks 5. Return in 4 weeks for follow-up   THERAPY PLAN:  Jeffrey [name] will continue with Nivolumab therapy every 2 weeks IV.  We will restage him in 4 weeks, which will be three months since his last restaging tests.  Interpretation of the results may prove difficult given the known radiographic findings suggestive of progression of disease while on Nivolumab that are truly a response to therapy due to lymphocytic infiltration.  PET scan is no more useful than CT imaging.  All questions were answered. The patient knows to call the clinic with any problems, questions or concerns. We can certainly see the patient much sooner if necessary.  Patient and plan discussed with Dr. Farrel Gobble and he is in agreement with the aforementioned.   KEFALAS,THOMAS 09/25/2013

## 2013-09-25 ENCOUNTER — Encounter (HOSPITAL_COMMUNITY): Payer: Managed Care, Other (non HMO) | Attending: Oncology

## 2013-09-25 ENCOUNTER — Encounter (HOSPITAL_BASED_OUTPATIENT_CLINIC_OR_DEPARTMENT_OTHER): Payer: Managed Care, Other (non HMO) | Admitting: Oncology

## 2013-09-25 VITALS — BP 112/67 | HR 77 | Temp 97.9°F | Resp 16 | Wt 137.8 lb

## 2013-09-25 DIAGNOSIS — C799 Secondary malignant neoplasm of unspecified site: Secondary | ICD-10-CM

## 2013-09-25 DIAGNOSIS — C7839 Secondary malignant neoplasm of other respiratory organs: Secondary | ICD-10-CM

## 2013-09-25 DIAGNOSIS — C7800 Secondary malignant neoplasm of unspecified lung: Secondary | ICD-10-CM

## 2013-09-25 DIAGNOSIS — C801 Malignant (primary) neoplasm, unspecified: Secondary | ICD-10-CM

## 2013-09-25 DIAGNOSIS — Z5112 Encounter for antineoplastic immunotherapy: Secondary | ICD-10-CM

## 2013-09-25 DIAGNOSIS — E785 Hyperlipidemia, unspecified: Secondary | ICD-10-CM | POA: Insufficient documentation

## 2013-09-25 DIAGNOSIS — I509 Heart failure, unspecified: Secondary | ICD-10-CM | POA: Insufficient documentation

## 2013-09-25 DIAGNOSIS — Z79899 Other long term (current) drug therapy: Secondary | ICD-10-CM | POA: Insufficient documentation

## 2013-09-25 DIAGNOSIS — Z5111 Encounter for antineoplastic chemotherapy: Secondary | ICD-10-CM | POA: Insufficient documentation

## 2013-09-25 DIAGNOSIS — C3410 Malignant neoplasm of upper lobe, unspecified bronchus or lung: Secondary | ICD-10-CM | POA: Insufficient documentation

## 2013-09-25 DIAGNOSIS — Z85118 Personal history of other malignant neoplasm of bronchus and lung: Secondary | ICD-10-CM

## 2013-09-25 DIAGNOSIS — C3490 Malignant neoplasm of unspecified part of unspecified bronchus or lung: Secondary | ICD-10-CM

## 2013-09-25 DIAGNOSIS — I10 Essential (primary) hypertension: Secondary | ICD-10-CM | POA: Insufficient documentation

## 2013-09-25 MED ORDER — SODIUM CHLORIDE 0.9 % IV SOLN
3.0000 mg/kg | Freq: Once | INTRAVENOUS | Status: AC
  Administered 2013-09-25: 190 mg via INTRAVENOUS
  Filled 2013-09-25: qty 19

## 2013-09-25 MED ORDER — SODIUM CHLORIDE 0.9 % IV SOLN
Freq: Once | INTRAVENOUS | Status: AC
  Administered 2013-09-25: 12:00:00 via INTRAVENOUS

## 2013-09-25 MED ORDER — SODIUM CHLORIDE 0.9 % IJ SOLN: 10.0000 mL | INTRAMUSCULAR | Status: DC | PRN

## 2013-09-25 MED ORDER — HEPARIN SOD (PORK) LOCK FLUSH 100 UNIT/ML IV SOLN
500.0000 [IU] | Freq: Once | INTRAVENOUS | Status: AC | PRN
  Administered 2013-09-25: 500 [IU]
  Filled 2013-09-25: qty 5

## 2013-09-25 MED ORDER — LORTAB 5-325 MG PO TABS
1.0000 | ORAL_TABLET | Freq: Four times a day (QID) | ORAL | Status: DC | PRN
Start: 2013-09-25 — End: 2013-11-20

## 2013-09-25 NOTE — Patient Instructions (Signed)
..  [facility] Discharge Instructions for Patients Receiving Chemotherapy  Today you received the following chemotherapy agents opdivo   If you develop nausea and vomiting, or diarrhea that is not controlled by your medication, call the clinic.  The clinic phone number is [phone]. Office hours are Monday-Friday 8:30am-5:00pm.  BELOW ARE SYMPTOMS THAT SHOULD BE REPORTED IMMEDIATELY:  *FEVER GREATER THAN 101.0 F  *CHILLS WITH OR WITHOUT FEVER  NAUSEA AND VOMITING THAT IS NOT CONTROLLED WITH YOUR NAUSEA MEDICATION  *UNUSUAL SHORTNESS OF BREATH  *UNUSUAL BRUISING OR BLEEDING  TENDERNESS IN MOUTH AND THROAT WITH OR WITHOUT PRESENCE OF ULCERS  *URINARY PROBLEMS  *BOWEL PROBLEMS  UNUSUAL RASH Items with * indicate a potential emergency and should be followed up as soon as possible. If you have an emergency after office hours please contact your primary care physician or go to the nearest emergency department.  Please call the clinic during office hours if you have any questions or concerns.   You may also contact the Patient Navigator at [phone] should you have any questions or need assistance in obtaining follow up care. _____________________________________________________________________ Have you asked about our STAR program?    STAR stands for Survivorship Training and Rehabilitation, and this is a nationally recognized cancer care program that focuses on survivorship and rehabilitation.  Cancer and cancer treatments may cause problems, such as, pain, making you feel tired and keeping you from doing the things that you need or want to do. Cancer rehabilitation can help. Our goal is to reduce these troubling effects and help you have the best quality of life possible.  You may receive a survey from a nurse that asks questions about your current state of health.  Based on the survey results, all eligible patients will be referred to the [facility] program for an  evaluation so we can better serve you! A frequently asked questions sheet is available upon request.

## 2013-09-25 NOTE — Progress Notes (Signed)
Patient tolerated treatment well.

## 2013-10-09 ENCOUNTER — Encounter (HOSPITAL_COMMUNITY): Payer: Self-pay

## 2013-10-09 ENCOUNTER — Encounter (HOSPITAL_BASED_OUTPATIENT_CLINIC_OR_DEPARTMENT_OTHER): Payer: Managed Care, Other (non HMO)

## 2013-10-09 DIAGNOSIS — Z5111 Encounter for antineoplastic chemotherapy: Secondary | ICD-10-CM | POA: Diagnosis not present

## 2013-10-09 DIAGNOSIS — I10 Essential (primary) hypertension: Secondary | ICD-10-CM | POA: Diagnosis not present

## 2013-10-09 DIAGNOSIS — Z79899 Other long term (current) drug therapy: Secondary | ICD-10-CM | POA: Diagnosis not present

## 2013-10-09 DIAGNOSIS — E785 Hyperlipidemia, unspecified: Secondary | ICD-10-CM | POA: Diagnosis not present

## 2013-10-09 DIAGNOSIS — C3410 Malignant neoplasm of upper lobe, unspecified bronchus or lung: Secondary | ICD-10-CM | POA: Diagnosis not present

## 2013-10-09 DIAGNOSIS — I509 Heart failure, unspecified: Secondary | ICD-10-CM | POA: Diagnosis not present

## 2013-10-09 DIAGNOSIS — C3490 Malignant neoplasm of unspecified part of unspecified bronchus or lung: Secondary | ICD-10-CM

## 2013-10-09 DIAGNOSIS — Z5112 Encounter for antineoplastic immunotherapy: Secondary | ICD-10-CM

## 2013-10-09 DIAGNOSIS — R918 Other nonspecific abnormal finding of lung field: Secondary | ICD-10-CM

## 2013-10-09 DIAGNOSIS — C7800 Secondary malignant neoplasm of unspecified lung: Secondary | ICD-10-CM | POA: Diagnosis not present

## 2013-10-09 DIAGNOSIS — Z85118 Personal history of other malignant neoplasm of bronchus and lung: Secondary | ICD-10-CM

## 2013-10-09 LAB — CBC WITH DIFFERENTIAL/PLATELET
Basophils Absolute: 0.1 10*3/uL (ref 0.0–0.1)
Basophils Relative: 1 % (ref 0–1)
Eosinophils Absolute: 0.6 10*3/uL (ref 0.0–0.7)
Eosinophils Relative: 11 % — ABNORMAL HIGH (ref 0–5)
HCT: 33.2 % — ABNORMAL LOW (ref 39.0–52.0)
HEMOGLOBIN: 11 g/dL — AB (ref 13.0–17.0)
LYMPHS ABS: 0.5 10*3/uL — AB (ref 0.7–4.0)
Lymphocytes Relative: 9 % — ABNORMAL LOW (ref 12–46)
MCH: 30.8 pg (ref 26.0–34.0)
MCHC: 33.1 g/dL (ref 30.0–36.0)
MCV: 93 fL (ref 78.0–100.0)
MONO ABS: 0.4 10*3/uL (ref 0.1–1.0)
Monocytes Relative: 8 % (ref 3–12)
Neutro Abs: 3.8 10*3/uL (ref 1.7–7.7)
Neutrophils Relative %: 71 % (ref 43–77)
Platelets: 187 10*3/uL (ref 150–400)
RBC: 3.57 MIL/uL — AB (ref 4.22–5.81)
RDW: 14.2 % (ref 11.5–15.5)
WBC: 5.4 10*3/uL (ref 4.0–10.5)

## 2013-10-09 LAB — COMPREHENSIVE METABOLIC PANEL
ALT: 8 U/L (ref 0–53)
AST: 14 U/L (ref 0–37)
Albumin: 3.3 g/dL — ABNORMAL LOW (ref 3.5–5.2)
Alkaline Phosphatase: 73 U/L (ref 39–117)
Anion gap: 11 (ref 5–15)
BILIRUBIN TOTAL: 0.2 mg/dL — AB (ref 0.3–1.2)
BUN: 24 mg/dL — ABNORMAL HIGH (ref 6–23)
CHLORIDE: 104 meq/L (ref 96–112)
CO2: 25 meq/L (ref 19–32)
Calcium: 8.5 mg/dL (ref 8.4–10.5)
Creatinine, Ser: 1.57 mg/dL — ABNORMAL HIGH (ref 0.50–1.35)
GFR calc Af Amer: 47 mL/min — ABNORMAL LOW (ref >90)
GFR, EST NON AFRICAN AMERICAN: 41 mL/min — AB (ref >90)
Glucose, Bld: 110 mg/dL — ABNORMAL HIGH (ref 70–99)
Potassium: 4.8 mEq/L (ref 3.7–5.3)
SODIUM: 140 meq/L (ref 137–147)
Total Protein: 7.3 g/dL (ref 6.0–8.3)

## 2013-10-09 LAB — LACTATE DEHYDROGENASE: LDH: 186 U/L (ref 94–250)

## 2013-10-09 MED ORDER — SODIUM CHLORIDE 0.9 % IJ SOLN
10.0000 mL | INTRAMUSCULAR | Status: DC | PRN
  Administered 2013-10-09: 10 mL

## 2013-10-09 MED ORDER — HEPARIN SOD (PORK) LOCK FLUSH 100 UNIT/ML IV SOLN
500.0000 [IU] | Freq: Once | INTRAVENOUS | Status: AC | PRN
  Administered 2013-10-09: 500 [IU]
  Filled 2013-10-09: qty 5

## 2013-10-09 MED ORDER — SODIUM CHLORIDE 0.9 % IV SOLN
3.0000 mg/kg | Freq: Once | INTRAVENOUS | Status: AC
  Administered 2013-10-09: 190 mg via INTRAVENOUS
  Filled 2013-10-09: qty 19

## 2013-10-09 MED ORDER — SODIUM CHLORIDE 0.9 % IV SOLN
Freq: Once | INTRAVENOUS | Status: AC
  Administered 2013-10-09: 11:00:00 via INTRAVENOUS

## 2013-10-09 NOTE — Patient Instructions (Signed)
[facility] Discharge Instructions for Patients Receiving Chemotherapy  Today you received the following chemotherapy agents Opdivo.  To help prevent nausea and vomiting after your treatment, we encourage you to take your nausea medication as prescribed.     If you develop nausea and vomiting, or diarrhea that is not controlled by your medication, call the clinic.  The clinic phone number is [phone]. Office hours are Monday-Friday 8:30am-5:00pm.  BELOW ARE SYMPTOMS THAT SHOULD BE REPORTED IMMEDIATELY:  *FEVER GREATER THAN 101.0 F  *CHILLS WITH OR WITHOUT FEVER  NAUSEA AND VOMITING THAT IS NOT CONTROLLED WITH YOUR NAUSEA MEDICATION  *UNUSUAL SHORTNESS OF BREATH  *UNUSUAL BRUISING OR BLEEDING  TENDERNESS IN MOUTH AND THROAT WITH OR WITHOUT PRESENCE OF ULCERS  *URINARY PROBLEMS  *BOWEL PROBLEMS  UNUSUAL RASH Items with * indicate a potential emergency and should be followed up as soon as possible. If you have an emergency after office hours please contact your primary care physician or go to the nearest emergency department.  Please call the clinic during office hours if you have any questions or concerns.   You may also contact the Patient Navigator at [phone] should you have any questions or need assistance in obtaining follow up care. _____________________________________________________________________ Have you asked about our STAR program?    STAR stands for Survivorship Training and Rehabilitation, and this is a nationally recognized cancer care program that focuses on survivorship and rehabilitation.  Cancer and cancer treatments may cause problems, such as, pain, making you feel tired and keeping you from doing the things that you need or want to do. Cancer rehabilitation can help. Our goal is to reduce these troubling effects and help you have the best quality of life possible.  You may receive a survey from a nurse that asks questions about  your current state of health.  Based on the survey results, all eligible patients will be referred to the [facility] program for an evaluation so we can better serve you! A frequently asked questions sheet is available upon request.

## 2013-10-09 NOTE — Progress Notes (Unsigned)
Patient tolerated chemotherapy well.

## 2013-10-21 ENCOUNTER — Ambulatory Visit (HOSPITAL_COMMUNITY)
Admission: RE | Admit: 2013-10-21 | Discharge: 2013-10-21 | Disposition: A | Discharge status: Home / Self Care | Payer: Managed Care, Other (non HMO) | Source: Ambulatory Visit | Attending: Oncology | Admitting: Oncology

## 2013-10-21 ENCOUNTER — Ambulatory Visit (HOSPITAL_COMMUNITY): Payer: Managed Care, Other (non HMO)

## 2013-10-21 ENCOUNTER — Telehealth (HOSPITAL_COMMUNITY): Payer: Self-pay | Admitting: Oncology

## 2013-10-21 DIAGNOSIS — C799 Secondary malignant neoplasm of unspecified site: Secondary | ICD-10-CM

## 2013-10-21 DIAGNOSIS — C3412 Malignant neoplasm of upper lobe, left bronchus or lung: Secondary | ICD-10-CM | POA: Insufficient documentation

## 2013-10-21 NOTE — Telephone Encounter (Signed)
Discussed case with Peer to peer reviewer.  I corrected him regarding the patient's diagnosis of metastatic squamous cell carcinoma of lung (instead of laryngeal ca).  I reviewed the patient's case with the insurance reviewer including past scan result.  He approved the CT abd without contrast, but denied the pelvis.  We will pursue CT of chest and abdomen without contrast.  He reports the approval information will be faxed to [place] within the next 12 hours.  New order placed for Ct abd (no pelvis).  Called radiology to update the patient's appointment today and discussed this with [facility].  KEFALAS,THOMAS

## 2013-10-23 ENCOUNTER — Encounter (HOSPITAL_BASED_OUTPATIENT_CLINIC_OR_DEPARTMENT_OTHER): Payer: Managed Care, Other (non HMO)

## 2013-10-23 ENCOUNTER — Encounter (HOSPITAL_COMMUNITY): Payer: Managed Care, Other (non HMO) | Attending: Oncology

## 2013-10-23 VITALS — BP 136/72 | HR 75 | Temp 97.8°F | Resp 18 | Wt 140.0 lb

## 2013-10-23 VITALS — BP 124/74 | HR 70 | Temp 97.9°F | Resp 20

## 2013-10-23 DIAGNOSIS — G4452 New daily persistent headache (NDPH): Secondary | ICD-10-CM

## 2013-10-23 DIAGNOSIS — Z85118 Personal history of other malignant neoplasm of bronchus and lung: Secondary | ICD-10-CM | POA: Diagnosis present

## 2013-10-23 DIAGNOSIS — R918 Other nonspecific abnormal finding of lung field: Secondary | ICD-10-CM | POA: Insufficient documentation

## 2013-10-23 DIAGNOSIS — C3412 Malignant neoplasm of upper lobe, left bronchus or lung: Secondary | ICD-10-CM

## 2013-10-23 DIAGNOSIS — C799 Secondary malignant neoplasm of unspecified site: Secondary | ICD-10-CM

## 2013-10-23 DIAGNOSIS — Z5112 Encounter for antineoplastic immunotherapy: Secondary | ICD-10-CM

## 2013-10-23 DIAGNOSIS — C3490 Malignant neoplasm of unspecified part of unspecified bronchus or lung: Secondary | ICD-10-CM

## 2013-10-23 DIAGNOSIS — C7800 Secondary malignant neoplasm of unspecified lung: Secondary | ICD-10-CM

## 2013-10-23 DIAGNOSIS — Z8521 Personal history of malignant neoplasm of larynx: Secondary | ICD-10-CM

## 2013-10-23 DIAGNOSIS — C801 Malignant (primary) neoplasm, unspecified: Secondary | ICD-10-CM | POA: Insufficient documentation

## 2013-10-23 LAB — COMPREHENSIVE METABOLIC PANEL
ALBUMIN: 3.7 g/dL (ref 3.5–5.2)
ALK PHOS: 75 U/L (ref 39–117)
ALT: 6 U/L (ref 0–53)
AST: 13 U/L (ref 0–37)
Anion gap: 14 (ref 5–15)
BILIRUBIN TOTAL: 0.3 mg/dL (ref 0.3–1.2)
BUN: 23 mg/dL (ref 6–23)
CO2: 23 mEq/L (ref 19–32)
Calcium: 9 mg/dL (ref 8.4–10.5)
Chloride: 103 mEq/L (ref 96–112)
Creatinine, Ser: 1.7 mg/dL — ABNORMAL HIGH (ref 0.50–1.35)
GFR calc Af Amer: 43 mL/min — ABNORMAL LOW (ref >90)
GFR calc non Af Amer: 37 mL/min — ABNORMAL LOW (ref >90)
Glucose, Bld: 96 mg/dL (ref 70–99)
POTASSIUM: 4.7 meq/L (ref 3.7–5.3)
SODIUM: 140 meq/L (ref 137–147)
Total Protein: 8 g/dL (ref 6.0–8.3)

## 2013-10-23 LAB — TSH: TSH: 9.74 u[IU]/mL — AB (ref 0.350–4.500)

## 2013-10-23 LAB — T4, FREE: Free T4: 0.9 ng/dL (ref 0.80–1.80)

## 2013-10-23 MED ORDER — HEPARIN SOD (PORK) LOCK FLUSH 100 UNIT/ML IV SOLN
500.0000 [IU] | Freq: Once | INTRAVENOUS | Status: AC | PRN
  Administered 2013-10-23: 500 [IU]
  Filled 2013-10-23: qty 5

## 2013-10-23 MED ORDER — SODIUM CHLORIDE 0.9 % IJ SOLN: 10.0000 mL | INTRAMUSCULAR | Status: DC | PRN

## 2013-10-23 MED ORDER — SODIUM CHLORIDE 0.9 % IV SOLN
3.0000 mg/kg | Freq: Once | INTRAVENOUS | Status: AC
  Administered 2013-10-23: 190 mg via INTRAVENOUS
  Filled 2013-10-23: qty 19

## 2013-10-23 MED ORDER — SODIUM CHLORIDE 0.9 % IV SOLN
Freq: Once | INTRAVENOUS | Status: AC
  Administered 2013-10-23: 11:00:00 via INTRAVENOUS

## 2013-10-23 NOTE — Progress Notes (Signed)
Tolerated infusion well. 

## 2013-10-23 NOTE — Patient Instructions (Signed)
[place] Discharge Instructions  RECOMMENDATIONS MADE BY THE CONSULTANT AND ANY TEST RESULTS WILL BE SENT TO YOUR REFERRING PHYSICIAN.  EXAM FINDINGS BY THE PHYSICIAN TODAY AND SIGNS OR SYMPTOMS TO REPORT TO CLINIC OR PRIMARY PHYSICIAN: Exam and findings as discussed by Dr.Heller.  MEDICATIONS PRESCRIBED:  Continue with current Nivolumab therapy every 14 days.  INSTRUCTIONS/FOLLOW-UP: Lab work monthly.  MD appointment monthly. Report any issues/concerns as needed.  Thank you for choosing [facility] to provide your oncology and hematology care.  To afford each patient quality time with our providers, please arrive at least 15 minutes before your scheduled appointment time.  With your help, our goal is to use those 15 minutes to complete the necessary work-up to ensure our physicians have the information they need to help with your evaluation and healthcare recommendations.    Effective January 1st, 2014, we ask that you re-schedule your appointment with our physicians should you arrive 10 or more minutes late for your appointment.  We strive to give you quality time with our providers, and arriving late affects you and other patients whose appointments are after yours.    Again, thank you for choosing [facility].  Our hope is that these requests will decrease the amount of time that you wait before being seen by our physicians.       _____________________________________________________________  Should you have questions after your visit to [facility], please contact our office at [phone] between the hours of 8:30 a.m. and 4:30 p.m.  Voicemails left after 4:30 p.m. will not be returned until the following business day.  For prescription refill requests, have your pharmacy contact our office with your prescription refill request.    _______________________________________________________________  We hope that we have  given you very good care.  You may receive a patient satisfaction survey in the mail, please complete it and return it as soon as possible.  We value your feedback!  _______________________________________________________________  Have you asked about our STAR program?  STAR stands for Survivorship Training and Rehabilitation, and this is a nationally recognized cancer care program that focuses on survivorship and rehabilitation.  Cancer and cancer treatments may cause problems, such as, pain, making you feel tired and keeping you from doing the things that you need or want to do. Cancer rehabilitation can help. Our goal is to reduce these troubling effects and help you have the best quality of life possible.  You may receive a survey from a nurse that asks questions about your current state of health.  Based on the survey results, all eligible patients will be referred to the [facility] program for an evaluation so we can better serve you!  A frequently asked questions sheet is available upon request.

## 2013-10-24 NOTE — Progress Notes (Signed)
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  The patient is not a candidate for stereotactic radiotherapy (SBRT). 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 He denies any current symptoms associated with his lung mass or side effects with Nivolumab except for becoming hypothyroid. He is on replacement.  MEDICAL HISTORY:  Past Medical History  Diagnosis Date  . Tracheostomy in place   . Cancer 2010    laryngeal  . Lung cancer 2010  . Pneumonia 2014  . Dysrhythmia     takes Coreg and Lisinopril daily  . Shortness of breath     with exertion  . Enlarged prostate     takes flomax  . Urinary urgency   . Gastric ulcer     hx of  . Hx of transfusion of packed red blood cells     no abnormal reaction noted by patient  . Umbilical hernia   . Metastatic cancer 07/24/2012  . Hyperlipidemia   . ED (erectile dysfunction)   . Insomnia   . CHF (congestive heart failure)     has TOBACCO ABUSE; EAR PAIN, LEFT; C O P D; NECK PAIN, LEFT; POSTNASAL DRIP SYNDROME; UNSPECIFIED RESPIRATORY ABNORMALITY; History of laryngeal cancer; History of lung cancer; Tracheostomy in place; Elevated brain natriuretic peptide (BNP) level; Pulmonary nodules; Bilateral hydronephrosis; Cardiomyopathy; Metastatic cancer; Prostate hypertrophy; and Insomnia on his problem list.    ALLERGIES:  has No Known Allergies.  MEDICATIONS: has a current medication list which  includes the following prescription(s): aspirin, bisacodyl, carvedilol, esomeprazole, levothyroxine, lidocaine-prilocaine, lisinopril, lorazepam, lortab, naproxen sodium, ondansetron, and terazosin, and the following Facility-Administered Medications: sodium chloride.  FAMILY HISTORY: family history includes Diabetes in his father; Heart disease in his father.  REVIEW OF SYSTEMS:    SINCE YOUR LAST VISIT ECOG Perf Status: Fully active, able to carry on all pre-disease performance without restriction Pain Assessment Pain Score: 0-No pain  Other than that discussed above is noncontributory.    PHYSICAL EXAMINATION:   weight is 140 lb (63.504 kg). His oral temperature is 97.8 F (36.6 C). His blood pressure is 136/72 and his pulse is 75. His respiration is 18 and oxygen saturation is 98%.    GENERAL:alert, no distress and comfortable EYES: PERRL, EOM intact. Conjunctiva are pink and sclera clear OROPHARYNX:no exudate, no erythema and lips, buccal mucosa, and tongue normal  NECK: Tracheostomy is unremarkable. Neck is non-tender, without nodularity or masses CHEST/BREASTS:  No rib tenderness. No axillary adenopathy  LUNGS: clear to auscultation and percussion with normal breathing effort HEART: regular rate & rhythm and no murmurs, S3, or S4 ABDOMEN: soft, non-tender, normal bowel sounds;  liver and spleen are nonpalpable. No inguinal adenopathy. MUSCULOSKELETAL: Spine without localized tenderness. No edema.   SKIN: no jaundice, bruises, or rashes. NEURO: alert & oriented x 3 with fluent speech, peripheral adenopathy.      LABORATORY DATA: Infusion on 10/23/2013  Component Date Value Ref Range Status  .  TSH 10/23/2013 9.740* 0.350 - 4.500 uIU/mL Final   Performed at [facility]  . Sodium 10/23/2013 140  137 - 147 mEq/L Final  . Potassium 10/23/2013 4.7  3.7 - 5.3 mEq/L Final  . Chloride 10/23/2013 103  96 - 112 mEq/L Final  . CO2 10/23/2013 23  19 - 32 mEq/L Final  .  Glucose, Bld 10/23/2013 96  70 - 99 mg/dL Final  . BUN 10/23/2013 23  6 - 23 mg/dL Final  . Creatinine, Ser 10/23/2013 1.70* 0.50 - 1.35 mg/dL Final  . Calcium 10/23/2013 9.0  8.4 - 10.5 mg/dL Final  . Total Protein 10/23/2013 8.0  6.0 - 8.3 g/dL Final  . Albumin 10/23/2013 3.7  3.5 - 5.2 g/dL Final  . AST 10/23/2013 13  0 - 37 U/L Final  . ALT 10/23/2013 6  0 - 53 U/L Final  . Alkaline Phosphatase 10/23/2013 75  39 - 117 U/L Final  . Total Bilirubin 10/23/2013 0.3  0.3 - 1.2 mg/dL Final  . GFR calc non Af Amer 10/23/2013 37* >90 mL/min Final  . GFR calc Af Amer 10/23/2013 43* >90 mL/min Final   Comment: (NOTE)                          The eGFR has been calculated using the CKD EPI equation.                          This calculation has not been validated in all clinical situations.                          eGFR's persistently <90 mL/min signify possible Chronic Kidney                          Disease.  . Anion gap 10/23/2013 14  5 - 15 Final  . Free T4 10/23/2013 0.90  0.80 - 1.80 ng/dL Final   Performed at [facility] on 10/09/2013  Component Date Value Ref Range Status  . WBC 10/09/2013 5.4  4.0 - 10.5 K/uL Final  . RBC 10/09/2013 3.57* 4.22 - 5.81 MIL/uL Final  . Hemoglobin 10/09/2013 11.0* 13.0 - 17.0 g/dL Final  . HCT 10/09/2013 33.2* 39.0 - 52.0 % Final  . MCV 10/09/2013 93.0  78.0 - 100.0 fL Final  . MCH 10/09/2013 30.8  26.0 - 34.0 pg Final  . MCHC 10/09/2013 33.1  30.0 - 36.0 g/dL Final  . RDW 10/09/2013 14.2  11.5 - 15.5 % Final  . Platelets 10/09/2013 187  150 - 400 K/uL Final  . Neutrophils Relative % 10/09/2013 71  43 - 77 % Final  . Neutro Abs 10/09/2013 3.8  1.7 - 7.7 K/uL Final  . Lymphocytes Relative 10/09/2013 9* 12 - 46 % Final  . Lymphs Abs 10/09/2013 0.5* 0.7 - 4.0 K/uL Final  . Monocytes Relative 10/09/2013 8  3 - 12 % Final  . Monocytes Absolute 10/09/2013 0.4  0.1 - 1.0 K/uL Final  . Eosinophils Relative 10/09/2013 11* 0 - 5 % Final  .  Eosinophils Absolute 10/09/2013 0.6  0.0 - 0.7 K/uL Final  . Basophils Relative 10/09/2013 1  0 - 1 % Final  . Basophils Absolute 10/09/2013 0.1  0.0 - 0.1 K/uL Final  . Sodium 10/09/2013 140  137 - 147 mEq/L Final  . Potassium  10/09/2013 4.8  3.7 - 5.3 mEq/L Final  . Chloride 10/09/2013 104  96 - 112 mEq/L Final  . CO2 10/09/2013 25  19 - 32 mEq/L Final  . Glucose, Bld 10/09/2013 110* 70 - 99 mg/dL Final  . BUN 10/09/2013 24* 6 - 23 mg/dL Final  . Creatinine, Ser 10/09/2013 1.57* 0.50 - 1.35 mg/dL Final  . Calcium 10/09/2013 8.5  8.4 - 10.5 mg/dL Final  . Total Protein 10/09/2013 7.3  6.0 - 8.3 g/dL Final  . Albumin 10/09/2013 3.3* 3.5 - 5.2 g/dL Final  . AST 10/09/2013 14  0 - 37 U/L Final  . ALT 10/09/2013 8  0 - 53 U/L Final  . Alkaline Phosphatase 10/09/2013 73  39 - 117 U/L Final  . Total Bilirubin 10/09/2013 0.2* 0.3 - 1.2 mg/dL Final  . GFR calc non Af Amer 10/09/2013 41* >90 mL/min Final  . GFR calc Af Amer 10/09/2013 47* >90 mL/min Final   Comment: (NOTE)                          The eGFR has been calculated using the CKD EPI equation.                          This calculation has not been validated in all clinical situations.                          eGFR's persistently <90 mL/min signify possible Chronic Kidney                          Disease.  . Anion gap 10/09/2013 11  5 - 15 Final  . LDH 10/09/2013 186  94 - 250 U/L Final       RADIOGRAPHIC STUDIES: Ct Abdomen Wo Contrast  10-28-13   CLINICAL DATA:  Metastatic squamous cell carcinoma of the left upper lobe.  EXAM: CT CHEST AND ABDOMEN WITHOUT CONTRAST  TECHNIQUE: Multidetector CT imaging of the chest, abdomen and pelvis was performed following the standard protocol without IV contrast.  COMPARISON:  07/23/2013  FINDINGS: CT CHEST FINDINGS  Mediastinum: There is mild cardiac enlargement. Small amount of pericardial fluid is noted. Similar to previous exam. Calcified atherosclerotic plaque involves the thoracic aorta  as well as the LAD, left circumflex and RCA coronary arteries. No mediastinal or hilar adenopathy. The trachea is patent. Normal appearance of the esophagus. There is no axillary or supraclavicular adenopathy.  Lungs/Pleura: Small left pleural effusion is again noted and appears similar to previous exam. Moderate to advanced changes of centrilobular emphysema noted. 3.2 cm mass within the right apex is again noted. 2.8 cm previously. The adjacent nodule measures 1.2 cm previously 1.3 cm. Paramediastinal fibrosis is identified within the left upper lobe. Right middle lobe nodule measures 1.2 x 0.6 cm, image 36/series 6. Previously 1.5 x 0.8 cm.  Musculoskeletal: Review of the visualized osseous structures is significant for mild degenerative disc disease within the thoracic spine. The bones appear diffusely osteopenic. Chronic left rib postsurgical change identified.  CT ABDOMEN  FINDINGS  Hepatobiliary: There is no suspicious liver abnormality. The gallbladder appears normal. The common bile duct appears increased in caliber measuring up to 9 mm.  Pancreas: The pancreatic duct appears within normal limits. The pancreas is unremarkable.  Spleen: Normal appearance of the spleen.  Adrenals/Urinary Tract: The adrenal glands are both  unremarkable. Marked bilateral hydronephrosis and proximal hydroureter is again noted.  Stomach/Bowel: The stomach appears normal. The small bowel loops have a normal course and caliber without evidence for bowel obstruction. A moderate stool burden noted within the visualized portions of the colon.  Vascular/Lymphatic: Calcified atherosclerotic disease involves the abdominal aorta. No aneurysm. There is no enlarged retroperitoneal lymph nodes. No mesenteric adenopathy.  Other: No free fluid or fluid collections identified within the upper abdomen.  Musculoskeletal: Scoliosis and degenerative disc disease noted. No aggressive lytic or sclerotic bone lesion.  IMPRESSION: 1. The dominant right  apical mass is slightly increased in size from previous exam. The adjacent nodule is stable. 2. Emphysema. 3. Atherosclerotic disease including multi vessel coronary artery calcifications. 4. Persistent bilateral hydronephrosis. 5. No evidence for metastatic disease to the upper abdomen.   Electronically Signed   By: Kerby Moors M.D.   On: 10/21/2013 16:25    ASSESSMENT:   1. Recurrent bronchogenic carcinoma (SCC) receiving 3rd line treatment with Nivolumab IV every 2 weeks  2. 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    3. Multiple comorbidities     RECOMMENDATIONS/PLAN:   Patient is agreeable to continuing immunotherapy. In light of his thyroid test results, I would advise increasing levothyroxine to 75 mcg. Daily or 31mg. alternating with 1052m. every other day. Reassessment in 1 month while on therapy including repeat labs.      All questions were answered. The patient knows to call the clinic with any problems, questions or concerns.    HEDarrall DearsMD 10/24/2013 10:00 PM

## 2013-10-25 ENCOUNTER — Telehealth (HOSPITAL_COMMUNITY): Payer: Self-pay | Admitting: *Deleted

## 2013-10-25 NOTE — Telephone Encounter (Signed)
Patient notified via phone that he needed to take 1.5 tablets daily. He verbalized understanding. I also called Vaughan Basta his wife and left a voicemail about how he needed to increase his Levothyroxine to 31mcg daily instead of 30mcg.

## 2013-10-25 NOTE — Telephone Encounter (Signed)
Message copied by Gerhard Perches on Fri Oct 25, 2013  9:25 AM ------      Message from: Nelida Meuse S      Created: Thu Oct 24, 2013 10:28 PM       Tell patient to increase his thyroid  (42mcg) to 75 mcg daily. 1 and 1/2 pills daily. His thyroid tests are low. Dr. Bubba Hales  ------

## 2013-10-28 ENCOUNTER — Other Ambulatory Visit (HOSPITAL_COMMUNITY): Payer: Self-pay | Admitting: Oncology

## 2013-10-28 ENCOUNTER — Telehealth (HOSPITAL_COMMUNITY): Payer: Self-pay | Admitting: *Deleted

## 2013-10-28 DIAGNOSIS — E039 Hypothyroidism, unspecified: Secondary | ICD-10-CM

## 2013-10-28 MED ORDER — LEVOTHYROXINE SODIUM 50 MCG PO TABS: 100.0000 ug | ORAL_TABLET | Freq: Every day | ORAL | Status: DC

## 2013-10-28 NOTE — Telephone Encounter (Signed)
Message copied by Gerhard Perches on Mon Oct 28, 2013  1:38 PM ------      Message from: Baird Cancer      Created: Mon Oct 28, 2013  1:21 PM       I have escribed a new Rx for Synthroid to take 50 mcg 2 tablets daily (100 mcg daily).  Please let patient know.                  ----- Message -----         From: Farrel Gobble, MD         Sent: 10/26/2013   7:07 PM           To: Baird Cancer, PA-C            Was his Synthroid increased?  If not, go to 145mcg daily        ------

## 2013-10-28 NOTE — Telephone Encounter (Signed)
Voicemail left on patient's home phone and Linda's cell phone regarding increasing his Synthroid to 124mcg daily (2 whole tablets).

## 2013-10-31 ENCOUNTER — Other Ambulatory Visit (HOSPITAL_COMMUNITY): Payer: Self-pay | Admitting: Oncology

## 2013-10-31 ENCOUNTER — Encounter (HOSPITAL_COMMUNITY): Payer: Self-pay | Admitting: Lab

## 2013-10-31 ENCOUNTER — Ambulatory Visit (HOSPITAL_COMMUNITY)
Admission: RE | Admit: 2013-10-31 | Discharge: 2013-10-31 | Disposition: A | Discharge status: Home / Self Care | Payer: Managed Care, Other (non HMO) | Source: Ambulatory Visit | Attending: Oncology | Admitting: Oncology

## 2013-10-31 DIAGNOSIS — Z9221 Personal history of antineoplastic chemotherapy: Secondary | ICD-10-CM | POA: Diagnosis not present

## 2013-10-31 DIAGNOSIS — R51 Headache: Secondary | ICD-10-CM | POA: Diagnosis present

## 2013-10-31 DIAGNOSIS — M899 Disorder of bone, unspecified: Secondary | ICD-10-CM | POA: Insufficient documentation

## 2013-10-31 DIAGNOSIS — G4452 New daily persistent headache (NDPH): Secondary | ICD-10-CM

## 2013-10-31 DIAGNOSIS — C3490 Malignant neoplasm of unspecified part of unspecified bronchus or lung: Secondary | ICD-10-CM | POA: Insufficient documentation

## 2013-10-31 MED ORDER — IOHEXOL 300 MG/ML  SOLN
64.0000 mL | Freq: Once | INTRAMUSCULAR | Status: AC | PRN
  Administered 2013-10-31: 64 mL via INTRAVENOUS

## 2013-10-31 NOTE — Progress Notes (Signed)
Referral sent to [facility] / Dr Kathyrn Sheriff /  Records faxed on 10/15 /  Office to call patient with appointment

## 2013-11-01 ENCOUNTER — Telehealth (HOSPITAL_COMMUNITY): Payer: Self-pay

## 2013-11-01 NOTE — Telephone Encounter (Signed)
Message copied by Mellissa Kohut on Fri Nov 01, 2013 11:02 AM ------      Message from: Farrel Gobble A      Created: Fri Nov 01, 2013  9:14 AM       Hard to believe! It's in the topical section over the counter in the drugstore. If his headache has improved, he may not need it for now. I believe, set him up to see Dr. Kathyrn Sheriff. Her try to avoid steroids because of his concurrent treatment with Nivolumab. ------

## 2013-11-01 NOTE — Telephone Encounter (Signed)
Call from wife, Vaughan Basta - she has called multiple pharmacies and is not able to find anyone that has the glycerin.  Wants to know what to do?

## 2013-11-01 NOTE — Telephone Encounter (Signed)
Patient notified.  He states his headache is better so will not get anything for now.

## 2013-11-06 ENCOUNTER — Encounter (HOSPITAL_BASED_OUTPATIENT_CLINIC_OR_DEPARTMENT_OTHER): Payer: Managed Care, Other (non HMO)

## 2013-11-06 VITALS — BP 99/83 | HR 71 | Temp 98.1°F | Resp 20 | Wt 139.8 lb

## 2013-11-06 DIAGNOSIS — C3490 Malignant neoplasm of unspecified part of unspecified bronchus or lung: Secondary | ICD-10-CM

## 2013-11-06 DIAGNOSIS — R918 Other nonspecific abnormal finding of lung field: Secondary | ICD-10-CM

## 2013-11-06 DIAGNOSIS — Z5112 Encounter for antineoplastic immunotherapy: Secondary | ICD-10-CM

## 2013-11-06 DIAGNOSIS — Z85118 Personal history of other malignant neoplasm of bronchus and lung: Secondary | ICD-10-CM

## 2013-11-06 DIAGNOSIS — C7800 Secondary malignant neoplasm of unspecified lung: Secondary | ICD-10-CM

## 2013-11-06 DIAGNOSIS — E039 Hypothyroidism, unspecified: Secondary | ICD-10-CM

## 2013-11-06 LAB — CBC WITH DIFFERENTIAL/PLATELET
Basophils Absolute: 0.1 10*3/uL (ref 0.0–0.1)
Basophils Relative: 2 % — ABNORMAL HIGH (ref 0–1)
EOS ABS: 0.3 10*3/uL (ref 0.0–0.7)
Eosinophils Relative: 7 % — ABNORMAL HIGH (ref 0–5)
HCT: 33.3 % — ABNORMAL LOW (ref 39.0–52.0)
HEMOGLOBIN: 11.1 g/dL — AB (ref 13.0–17.0)
LYMPHS ABS: 0.5 10*3/uL — AB (ref 0.7–4.0)
LYMPHS PCT: 11 % — AB (ref 12–46)
MCH: 30.7 pg (ref 26.0–34.0)
MCHC: 33.3 g/dL (ref 30.0–36.0)
MCV: 92 fL (ref 78.0–100.0)
MONOS PCT: 9 % (ref 3–12)
Monocytes Absolute: 0.4 10*3/uL (ref 0.1–1.0)
NEUTROS ABS: 3.1 10*3/uL (ref 1.7–7.7)
NEUTROS PCT: 71 % (ref 43–77)
Platelets: 181 10*3/uL (ref 150–400)
RBC: 3.62 MIL/uL — AB (ref 4.22–5.81)
RDW: 14.2 % (ref 11.5–15.5)
WBC: 4.4 10*3/uL (ref 4.0–10.5)

## 2013-11-06 LAB — COMPREHENSIVE METABOLIC PANEL
ALT: 6 U/L (ref 0–53)
ANION GAP: 11 (ref 5–15)
AST: 10 U/L (ref 0–37)
Albumin: 3.5 g/dL (ref 3.5–5.2)
Alkaline Phosphatase: 77 U/L (ref 39–117)
BILIRUBIN TOTAL: 0.2 mg/dL — AB (ref 0.3–1.2)
BUN: 27 mg/dL — AB (ref 6–23)
CHLORIDE: 105 meq/L (ref 96–112)
CO2: 23 meq/L (ref 19–32)
Calcium: 8.8 mg/dL (ref 8.4–10.5)
Creatinine, Ser: 1.9 mg/dL — ABNORMAL HIGH (ref 0.50–1.35)
GFR, EST AFRICAN AMERICAN: 38 mL/min — AB (ref >90)
GFR, EST NON AFRICAN AMERICAN: 32 mL/min — AB (ref >90)
GLUCOSE: 105 mg/dL — AB (ref 70–99)
Potassium: 4.9 mEq/L (ref 3.7–5.3)
SODIUM: 139 meq/L (ref 137–147)
Total Protein: 7.6 g/dL (ref 6.0–8.3)

## 2013-11-06 MED ORDER — SODIUM CHLORIDE 0.9 % IV SOLN
3.0000 mg/kg | Freq: Once | INTRAVENOUS | Status: AC
  Administered 2013-11-06: 190 mg via INTRAVENOUS
  Filled 2013-11-06: qty 19

## 2013-11-06 MED ORDER — HEPARIN SOD (PORK) LOCK FLUSH 100 UNIT/ML IV SOLN
500.0000 [IU] | Freq: Once | INTRAVENOUS | Status: AC | PRN
  Administered 2013-11-06: 500 [IU]
  Filled 2013-11-06: qty 5

## 2013-11-06 MED ORDER — SODIUM CHLORIDE 0.9 % IJ SOLN
10.0000 mL | INTRAMUSCULAR | Status: DC | PRN
  Administered 2013-11-06: 10 mL

## 2013-11-06 MED ORDER — SODIUM CHLORIDE 0.9 % IV SOLN
Freq: Once | INTRAVENOUS | Status: AC
Start: 2013-11-06 — End: 2013-11-06
  Administered 2013-11-06: 10:00:00 via INTRAVENOUS

## 2013-11-06 NOTE — Progress Notes (Signed)
Tolerated chemo well. 

## 2013-11-06 NOTE — Patient Instructions (Signed)
[facility] Discharge Instructions for Patients Receiving Chemotherapy  Today you received the following chemotherapy agents Opdivo. We will check your thyroid level again in 2 weeks when you are here for chemo. Dr.Formanek recommends over the counter Glycerin, 1 tablespoon 3 times daily/mix with water and drink. You can find this is [facility], it is a liquid but it would be in the aisle with topical ointments such as neosporin. The referral for Neuro Consult has been sent to Dr.Nundkumar. Someone will contact you shortly with the appointment. If you have not heard about this appointment by the end of the week please call us back so we can follow up on it. To help prevent nausea and vomiting after your treatment, we encourage you to take your nausea medication as instructed.  If you develop nausea and vomiting that is not controlled by your nausea medication, call the clinic. If it is after clinic hours your family physician or the after hours number for the clinic or go to the Emergency Department.   BELOW ARE SYMPTOMS THAT SHOULD BE REPORTED IMMEDIATELY:  *FEVER GREATER THAN 101.0 F  *CHILLS WITH OR WITHOUT FEVER  NAUSEA AND VOMITING THAT IS NOT CONTROLLED WITH YOUR NAUSEA MEDICATION  *UNUSUAL SHORTNESS OF BREATH  *UNUSUAL BRUISING OR BLEEDING  TENDERNESS IN MOUTH AND THROAT WITH OR WITHOUT PRESENCE OF ULCERS  *URINARY PROBLEMS  *BOWEL PROBLEMS  UNUSUAL RASH Items with * indicate a potential emergency and should be followed up as soon as possible.  Return as scheduled.  I have been informed and understand all the instructions given to me. I know to contact the clinic, my physician, or go to the Emergency Department if any problems should occur. I do not have any questions at this time, but understand that I may call the clinic during office hours or the Patient Navigator at [phone] should I have any questions or need assistance in obtaining follow up  care.    __________________________________________  _____________  __________ Signature of Patient or Authorized Representative            Date                   Time    __________________________________________ Nurse's Signature

## 2013-11-07 ENCOUNTER — Encounter (HOSPITAL_COMMUNITY): Payer: Self-pay | Admitting: Lab

## 2013-11-07 NOTE — Progress Notes (Signed)
Appointment for [facility] is 11/3.  Patient is aware

## 2013-11-14 ENCOUNTER — Other Ambulatory Visit: Payer: Self-pay | Admitting: Family Medicine

## 2013-11-18 NOTE — Progress Notes (Signed)
[name], MD [place] [address]  Metastatic cancer - Plan: CBC with Differential, Comprehensive metabolic panel, TSH, HYDROcodone-acetaminophen (NORCO/VICODIN) 5-325 MG per tablet  CURRENT THERAPY: Salvage treatment with Nivolumab (immunotherapy) beginning on 07/31/2013  INTERVAL HISTORY: Jeffrey [name] 80 y.o. 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 From 07/31/2012 until 11/12/2012 patient received 6 cycles of carboplatin and Taxol because of enlargement of pulmonary nodules. He was placed on maintenance Tarceva on 12/10/2012 which was stopped after repeat CT scan on 05/01/2013 showed progression of disease. Patient not a candidate for SBRT. Therefore, he started Nivolumab on 07/31/2013.    Metastatic cancer   02/17/2011 Imaging PET scan- B/L hypermetabolic pulmonary nodules (3 in total) are most consistent with metastatic disease. No evidence of mediastinal nodal metastasis.  No evidence of metastasis in the abdomen or pelvis.   06/19/2012 Imaging CT scan of chest- Slight enlargement of pulmonary nodules/metastasis.    07/31/2012 - 11/12/2012 Chemotherapy Carboplatin/Paclitaxel x 6 cycles   11/26/2012 Imaging CT CAP- Similar size of a R apical pulm nodule.  Similar to improved left lower lobe nodularity, favored to be post infectious or inflammatory. Similar nodularity in the right middle lobe. No evidence of new or progressive disease. Similar borderline adeno    12/10/2012 - 05/07/2013 Chemotherapy Tarceva 150 mg daily   05/03/2013 Progression CT CAP- Interval increase in size of right upper lobe adjacent pulmonary nodules is highly concerning for progression of lung cancer recurrence.   07/31/2013 -  Chemotherapy  Nivolumab   10/21/2013 Imaging CT CAP- The dominant right apical mass is slightly increased in size from previous exam. The adjacent nodule is stable.   10/31/2013 Imaging CT of head- Intermediate density subdural collection/hematoma along the convexity on the right, maximal thickness 7 mm. 2 mm of right-to-left shift. No hyper acute hemorrhage.  Lytic lesion right posterior frontal calvarium, new since the previous study.   I personally reviewed and went over laboratory results with the patient.  The results are noted within this dictation.  I personally reviewed and went over radiographic studies with the patient.  The results are noted within this dictation.  The patient complained of headaches and therefore, a CT of head was performed and the results on 10/31/13 demonstrated the following:  Intermediate density subdural collection/hematoma along the convexity on the right, maximal thickness 7 mm. 2 mm of right-to-left shift. No hyper acute hemorrhage. No evidence of brain metastasis or other focal brain lesion. Lytic lesion right posterior frontal calvarium, new since the previous study, consistent with a lytic metastasis.  With this information, we referred the patient to Dr. Kathyrn Sheriff to allow for an opinion regarding subdural hematoma.  The lytic lesion may have been there at the start of salvage therapy and therefore cannot be used as an indicator for disease progression without a baseline study to compare it to. Due to right-to-left midline shift, we recommended he take glycerin (although it is a topical medication) by mouth since he is on Nivolumab (immunotherapy) which systemic steroids can blunt the effectiveness of Nivolumab, additionally, he is minimally symptomatic without any neurological deficits.he reports that he saw Dr. Kathyrn Sheriff and he is not interested in performing any surgeries for his subdural hematoma which is absolutely appropriate given  the patient's diagnosis and the lack  of neurologic deficits.    I have discussed the case with Dr. Barnet Glasgow and he is not inclined to start Xgeva at this time for the single lytic lesion in the calvarium.  Hopefully with treatment, this will sclerose and resolve as this is not a weight bearing bone.  He continues to have difficulties with getting his pain medication correct.  He is looking for a particular pill type.  He has discussed this with Tripp at [facility] and Fredderick Phenix reports to the patient that I have write the prescription a particular way.  As a result, I called Tripp today after seeing the patient.  He explained that I need to write for Norco and he will make sure that [place] get the correct pill type.  A new Rx was written today for this.   Oncologically, he denies any complaints and ROS questioning is negative. He is tolerating therapy well without any complaints other than fatigue lasting 3-4 days following infusion with a quick return back to baseline.  Past Medical History  Diagnosis Date  . Tracheostomy in place   . Cancer 2010    laryngeal  . Lung cancer 2010  . Pneumonia 2014  . Dysrhythmia     takes Coreg and Lisinopril daily  . Shortness of breath     with exertion  . Enlarged prostate     takes flomax  . Urinary urgency   . Gastric ulcer     hx of  . Hx of transfusion of packed red blood cells     no abnormal reaction noted by patient  . Umbilical hernia   . Metastatic cancer 07/24/2012  . Hyperlipidemia   . ED (erectile dysfunction)   . Insomnia   . CHF (congestive heart failure)     has TOBACCO ABUSE; EAR PAIN, LEFT; C O P D; NECK PAIN, LEFT; POSTNASAL DRIP SYNDROME; UNSPECIFIED RESPIRATORY ABNORMALITY; History of laryngeal cancer; History of lung cancer; Tracheostomy in place; Elevated brain natriuretic peptide (BNP) level; Pulmonary nodules; Bilateral hydronephrosis; Cardiomyopathy; Metastatic cancer; Prostate hypertrophy; and Insomnia on his problem list.     has No Known  Allergies.  Mr. Jeffrey [name] had no medications administered during this visit.  Past Surgical History  Procedure Laterality Date  . Tracheostomy  2010  . Lung removal, partial  2010    left side  . Layngectomy for laryngeal cancer in2010; left upper lobectomy for lung cancer, 2010.    . Back surgery      thinks it was done in 2012  . Esophagogastroduodenoscopy    . Colonoscopy    . Portacath placement Bilateral 07/12/2012    Procedure: INSERTION PORT-A-CATH;  Surgeon: [name], MD;  Location: [facility] OR;  Service: Thoracic;  Laterality: Bilateral;    Denies any headaches, dizziness, double vision, fevers, chills, night sweats, nausea, vomiting, diarrhea, constipation, chest pain, heart palpitations, shortness of breath, blood in stool, black tarry stool, urinary pain, urinary burning, urinary frequency, hematuria.   PHYSICAL EXAMINATION  ECOG PERFORMANCE STATUS: 1 - Symptomatic but completely ambulatory  Filed Vitals:   11/20/13 0900  BP: 109/58  Pulse: 90  Temp: 97.9 F (36.6 C)  Resp: 18    GENERAL:alert, no distress, well nourished, well developed, comfortable, cooperative and smiling SKIN: skin color, texture, turgor are normal, no rashes or significant lesions HEAD: Normocephalic, No masses, lesions, tenderness or abnormalities EYES: normal, PERRLA, EOMI, Conjunctiva are pink and non-injected EARS: External ears normal OROPHARYNX:mucous membranes are  moist  NECK: supple, trachea midline LYMPH:  no palpable lymphadenopathy BREAST:not examined LUNGS: clear to auscultation  HEART: regular rate & rhythm, no murmurs and no gallops ABDOMEN:abdomen soft and normal bowel sounds BACK: Back symmetric, no curvature. EXTREMITIES:less then 2 second capillary refill, no joint deformities, effusion, or inflammation, no skin discoloration, no cyanosis  NEURO: alert & oriented x 3 with fluent speech, no focal motor/sensory deficits, gait normal    LABORATORY DATA: CBC     Component Value Date/Time   WBC 4.3 11/20/2013 0930   RBC 3.52* 11/20/2013 0930   HGB 10.8* 11/20/2013 0930   HCT 32.2* 11/20/2013 0930   PLT 168 11/20/2013 0930   MCV 91.5 11/20/2013 0930   MCH 30.7 11/20/2013 0930   MCHC 33.5 11/20/2013 0930   RDW 13.9 11/20/2013 0930   LYMPHSABS 0.6* 11/20/2013 0930   MONOABS 0.3 11/20/2013 0930   EOSABS 0.4 11/20/2013 0930   BASOSABS 0.1 11/20/2013 0930    \   Chemistry      Component Value Date/Time   NA 139 11/20/2013 0930   K 4.5 11/20/2013 0930   CL 104 11/20/2013 0930   CO2 23 11/20/2013 0930   BUN 30* 11/20/2013 0930   CREATININE 1.91* 11/20/2013 0930   CREATININE 1.46* 11/12/2012 0839      Component Value Date/Time   CALCIUM 8.7 11/20/2013 0930   CALCIUM 8.5 01/26/2012 1312   ALKPHOS 82 11/20/2013 0930   AST 10 11/20/2013 0930   ALT 5 11/20/2013 0930   BILITOT 0.3 11/20/2013 0930       RADIOGRAPHIC STUDIES:  10/31/2013  CLINICAL DATA: New daily right-sided headache for the last 5 weeks. Non-small-cell lung cancer presently being treated with chemotherapy.  EXAM: CT HEAD WITHOUT AND WITH CONTRAST  TECHNIQUE: Contiguous axial images were obtained from the base of the skull through the vertex without and with intravenous contrast  CONTRAST: 80mL OMNIPAQUE IOHEXOL 300 MG/ML SOLN  COMPARISON: 04/02/2008  FINDINGS: There is and intermediate density subdural collection along the convexity on the right measuring 7 mm in thickness. There is right-to-left midline shift of 2 mm. No intraparenchymal hemorrhage. No evidence of acute hemorrhage. No evidence of mass lesion, old or acute infarction or hydrocephalus. After contrast administration, no abnormal enhancement occurs.  There is a lytic calvarial lesion in the right posterior frontal region measuring 1.6 cm in diameter. This could be a metastasis. No other lytic calvarial lesions are identified.  IMPRESSION: Intermediate density subdural  collection/hematoma along the convexity on the right, maximal thickness 7 mm. 2 mm of right-to-left shift. No hyper acute hemorrhage.  No evidence of brain metastasis or other focal brain lesion.  Lytic lesion right posterior frontal calvarium, new since the previous study, consistent with a lytic metastasis.  These results will be called to the ordering clinician or representative by the Radiologist Assistant, and communication documented in the PACS or zVision Dashboard.   Electronically Signed  By: Nelson Chimes M.D.  On: 10/31/2013 11:34     ASSESSMENT:  1. 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 From 07/31/2012 until 11/12/2012 patient received 6 cycles of carboplatin and Taxol because of enlargement of pulmonary nodules. He was placed on maintenance Tarceva on 12/10/2012 which was stopped after repeat CT scan on 05/01/2013 showed progression of disease. Patient not a candidate for SBRT. Therefore,  he started Nivolumab on 07/31/2013, tolerating well.  2. Cardiomyopathy, stable, without symptoms or signs of heart failure or dysrhythmia.  3. 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 Chronic obstructive pulmonary disease, no longer smoking.  5. 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 6. Right posterior frontal calvarium lytic lesion  Patient Active Problem List   Diagnosis Date Noted  . Prostate hypertrophy 11/21/2012  . Insomnia 11/21/2012  . Metastatic cancer 07/24/2012  . Cardiomyopathy 01/27/2012  . History of laryngeal cancer 01/26/2012  . History of lung cancer 01/26/2012  . Tracheostomy in place 01/26/2012  . Elevated brain natriuretic peptide (BNP) level 01/26/2012  . Pulmonary  nodules 01/26/2012  . Bilateral hydronephrosis 01/26/2012  . TOBACCO ABUSE 02/28/2008  . EAR PAIN, LEFT 02/28/2008  . C O P D 02/28/2008  . NECK PAIN, LEFT 02/28/2008  . POSTNASAL DRIP SYNDROME 02/28/2008  . UNSPECIFIED RESPIRATORY ABNORMALITY 02/28/2008     PLAN:  1. I personally reviewed and went over laboratory results with the patient.  The results are noted within this dictation. 2. I personally reviewed and went over radiographic studies with the patient.  The results are noted within this dictation.   3. Pre-chemo labs as ordered: CBC diff, TSH 4. Pre-chemo labs for future treatments ordered: CBC diff (every 2 weeks), CMET and TSH (every 4 weeks). 5. Continue with Nivolumab therapy as planned. 6. Discussed with Dr. Barnet Glasgow and he does not feel that starting Xgeva at this time is needed. 7. Return in 4 weeks for follow-up   THERAPY PLAN:  Prudencio will continue with Salvage Nivolumab therapy and we will restage him with CT imaging 3 months from his last scan.   All questions were answered. The patient knows to call the clinic with any problems, questions or concerns. We can certainly see the patient much sooner if necessary.  Patient and plan discussed with Dr. Farrel Gobble and he is in agreement with the aforementioned.   KEFALAS,THOMAS 11/20/2013

## 2013-11-20 ENCOUNTER — Encounter: Payer: Self-pay | Admitting: *Deleted

## 2013-11-20 ENCOUNTER — Encounter (HOSPITAL_BASED_OUTPATIENT_CLINIC_OR_DEPARTMENT_OTHER): Payer: Managed Care, Other (non HMO) | Admitting: Oncology

## 2013-11-20 ENCOUNTER — Encounter (HOSPITAL_COMMUNITY): Payer: Managed Care, Other (non HMO) | Attending: Oncology

## 2013-11-20 VITALS — BP 109/58 | HR 90 | Temp 97.9°F | Resp 18 | Wt 137.6 lb

## 2013-11-20 DIAGNOSIS — C7800 Secondary malignant neoplasm of unspecified lung: Secondary | ICD-10-CM

## 2013-11-20 DIAGNOSIS — C3412 Malignant neoplasm of upper lobe, left bronchus or lung: Secondary | ICD-10-CM

## 2013-11-20 DIAGNOSIS — R918 Other nonspecific abnormal finding of lung field: Secondary | ICD-10-CM | POA: Diagnosis present

## 2013-11-20 DIAGNOSIS — C801 Malignant (primary) neoplasm, unspecified: Secondary | ICD-10-CM | POA: Diagnosis present

## 2013-11-20 DIAGNOSIS — C799 Secondary malignant neoplasm of unspecified site: Secondary | ICD-10-CM

## 2013-11-20 DIAGNOSIS — G9389 Other specified disorders of brain: Secondary | ICD-10-CM

## 2013-11-20 DIAGNOSIS — Z5112 Encounter for antineoplastic immunotherapy: Secondary | ICD-10-CM

## 2013-11-20 DIAGNOSIS — Z85118 Personal history of other malignant neoplasm of bronchus and lung: Secondary | ICD-10-CM | POA: Diagnosis not present

## 2013-11-20 DIAGNOSIS — E039 Hypothyroidism, unspecified: Secondary | ICD-10-CM

## 2013-11-20 DIAGNOSIS — G620 Drug-induced polyneuropathy: Secondary | ICD-10-CM

## 2013-11-20 LAB — COMPREHENSIVE METABOLIC PANEL
ALBUMIN: 3.5 g/dL (ref 3.5–5.2)
ALT: 5 U/L (ref 0–53)
ANION GAP: 12 (ref 5–15)
AST: 10 U/L (ref 0–37)
Alkaline Phosphatase: 82 U/L (ref 39–117)
BUN: 30 mg/dL — AB (ref 6–23)
CO2: 23 mEq/L (ref 19–32)
Calcium: 8.7 mg/dL (ref 8.4–10.5)
Chloride: 104 mEq/L (ref 96–112)
Creatinine, Ser: 1.91 mg/dL — ABNORMAL HIGH (ref 0.50–1.35)
GFR calc non Af Amer: 32 mL/min — ABNORMAL LOW (ref >90)
GFR, EST AFRICAN AMERICAN: 37 mL/min — AB (ref >90)
Glucose, Bld: 129 mg/dL — ABNORMAL HIGH (ref 70–99)
Potassium: 4.5 mEq/L (ref 3.7–5.3)
SODIUM: 139 meq/L (ref 137–147)
TOTAL PROTEIN: 7.6 g/dL (ref 6.0–8.3)
Total Bilirubin: 0.3 mg/dL (ref 0.3–1.2)

## 2013-11-20 LAB — CBC WITH DIFFERENTIAL/PLATELET
Basophils Absolute: 0.1 10*3/uL (ref 0.0–0.1)
Basophils Relative: 1 % (ref 0–1)
EOS ABS: 0.4 10*3/uL (ref 0.0–0.7)
EOS PCT: 8 % — AB (ref 0–5)
HCT: 32.2 % — ABNORMAL LOW (ref 39.0–52.0)
Hemoglobin: 10.8 g/dL — ABNORMAL LOW (ref 13.0–17.0)
Lymphocytes Relative: 13 % (ref 12–46)
Lymphs Abs: 0.6 10*3/uL — ABNORMAL LOW (ref 0.7–4.0)
MCH: 30.7 pg (ref 26.0–34.0)
MCHC: 33.5 g/dL (ref 30.0–36.0)
MCV: 91.5 fL (ref 78.0–100.0)
MONOS PCT: 7 % (ref 3–12)
Monocytes Absolute: 0.3 10*3/uL (ref 0.1–1.0)
NEUTROS PCT: 71 % (ref 43–77)
Neutro Abs: 3.1 10*3/uL (ref 1.7–7.7)
PLATELETS: 168 10*3/uL (ref 150–400)
RBC: 3.52 MIL/uL — ABNORMAL LOW (ref 4.22–5.81)
RDW: 13.9 % (ref 11.5–15.5)
WBC: 4.3 10*3/uL (ref 4.0–10.5)

## 2013-11-20 LAB — TSH: TSH: 0.446 u[IU]/mL (ref 0.350–4.500)

## 2013-11-20 MED ORDER — HYDROCODONE-ACETAMINOPHEN 5-325 MG PO TABS: 1.0000 | ORAL_TABLET | Freq: Four times a day (QID) | ORAL | Status: DC | PRN

## 2013-11-20 MED ORDER — HEPARIN SOD (PORK) LOCK FLUSH 100 UNIT/ML IV SOLN
500.0000 [IU] | Freq: Once | INTRAVENOUS | Status: AC | PRN
  Administered 2013-11-20: 500 [IU]
  Filled 2013-11-20: qty 5

## 2013-11-20 MED ORDER — SODIUM CHLORIDE 0.9 % IV SOLN
3.0000 mg/kg | Freq: Once | INTRAVENOUS | Status: AC
  Administered 2013-11-20: 190 mg via INTRAVENOUS
  Filled 2013-11-20: qty 19

## 2013-11-20 MED ORDER — SODIUM CHLORIDE 0.9 % IV SOLN
Freq: Once | INTRAVENOUS | Status: AC
  Administered 2013-11-20: 10:00:00 via INTRAVENOUS

## 2013-11-20 MED ORDER — SODIUM CHLORIDE 0.9 % IJ SOLN: 10.0000 mL | INTRAMUSCULAR | Status: DC | PRN

## 2013-11-20 NOTE — Progress Notes (Signed)
[place] Clinical Social Work  Holiday representative met with patient at [facility] to offer support and reassess for needs.  CSW had met pt in the past and was checking in for any current needs. Pt had no new concerns at this time. CSW and pt spent time talking and processing current treatment. Pt appreciative of visit and CSW will check in again in two weeks.   Clinical Social Work interventions: Reassessment of needs   Loren Racer, [place] Tuesdays 8:30-1pm Wednesdays 8:30-12pm  [phone]

## 2013-11-20 NOTE — Progress Notes (Signed)
Patient tolerated treatment well.

## 2013-11-20 NOTE — Patient Instructions (Signed)
[facility] Discharge Instructions for Patients Receiving Chemotherapy  Today you received the following chemotherapy agents Opdivo  Please call for any questions or concerns.  Follow up as scheduled.    If you develop nausea and vomiting, or diarrhea that is not controlled by your medication, call the clinic.  The clinic phone number is [phone]. Office hours are Monday-Friday 8:30am-5:00pm.  BELOW ARE SYMPTOMS THAT SHOULD BE REPORTED IMMEDIATELY:  *FEVER GREATER THAN 101.0 F  *CHILLS WITH OR WITHOUT FEVER  NAUSEA AND VOMITING THAT IS NOT CONTROLLED WITH YOUR NAUSEA MEDICATION  *UNUSUAL SHORTNESS OF BREATH  *UNUSUAL BRUISING OR BLEEDING  TENDERNESS IN MOUTH AND THROAT WITH OR WITHOUT PRESENCE OF ULCERS  *URINARY PROBLEMS  *BOWEL PROBLEMS  UNUSUAL RASH Items with * indicate a potential emergency and should be followed up as soon as possible. If you have an emergency after office hours please contact your primary care physician or go to the nearest emergency department.  Please call the clinic during office hours if you have any questions or concerns.   You may also contact the Patient Navigator at [phone] should you have any questions or need assistance in obtaining follow up care. _____________________________________________________________________ Have you asked about our STAR program?    STAR stands for Survivorship Training and Rehabilitation, and this is a nationally recognized cancer care program that focuses on survivorship and rehabilitation.  Cancer and cancer treatments may cause problems, such as, pain, making you feel tired and keeping you from doing the things that you need or want to do. Cancer rehabilitation can help. Our goal is to reduce these troubling effects and help you have the best quality of life possible.  You may receive a survey from a nurse that asks questions about your current state of health.  Based on the survey  results, all eligible patients will be referred to the [facility] program for an evaluation so we can better serve you! A frequently asked questions sheet is available upon request.

## 2013-11-20 NOTE — Patient Instructions (Addendum)
[facility] Discharge Instructions  RECOMMENDATIONS MADE BY THE CONSULTANT AND ANY TEST RESULTS WILL BE SENT TO YOUR REFERRING PHYSICIAN.  Pre-chemo labs as ordered Continue with Nivolumab therapy as planned. Rx for Norco.  Discussed medication with Tripp at [facility].  Get the prescription filled at [facility]. Return in 4 weeks for follow-up   Thank you for choosing [facility] to provide your oncology and hematology care.  To afford each patient quality time with our providers, please arrive at least 15 minutes before your scheduled appointment time.  With your help, our goal is to use those 15 minutes to complete the necessary work-up to ensure our physicians have the information they need to help with your evaluation and healthcare recommendations.    Effective January 1st, 2014, we ask that you re-schedule your appointment with our physicians should you arrive 10 or more minutes late for your appointment.  We strive to give you quality time with our providers, and arriving late affects you and other patients whose appointments are after yours.    Again, thank you for choosing [facility]  Our hope is that these requests will decrease the amount of time that you wait before being seen by our physicians.       _____________________________________________________________  Should you have questions after your visit to [facility], please contact our office at [phone] between the hours of 8:30 a.m. and 5:00 p.m.  Voicemails left after 4:30 p.m. will not be returned until the following business day.  For prescription refill requests, have your pharmacy contact our office with your prescription refill request.

## 2013-11-27 ENCOUNTER — Other Ambulatory Visit (HOSPITAL_COMMUNITY): Payer: Self-pay | Admitting: Oncology

## 2013-11-27 ENCOUNTER — Other Ambulatory Visit (HOSPITAL_COMMUNITY): Payer: Self-pay | Admitting: Neurosurgery

## 2013-11-27 DIAGNOSIS — G939 Disorder of brain, unspecified: Secondary | ICD-10-CM

## 2013-11-27 DIAGNOSIS — E039 Hypothyroidism, unspecified: Secondary | ICD-10-CM

## 2013-11-27 MED ORDER — LEVOTHYROXINE SODIUM 50 MCG PO TABS: 100.0000 ug | ORAL_TABLET | Freq: Every day | ORAL | Status: DC

## 2013-11-28 ENCOUNTER — Other Ambulatory Visit (HOSPITAL_COMMUNITY): Payer: Self-pay | Admitting: Oncology

## 2013-11-28 DIAGNOSIS — E039 Hypothyroidism, unspecified: Secondary | ICD-10-CM

## 2013-11-28 MED ORDER — LEVOTHYROXINE SODIUM 50 MCG PO TABS: 100.0000 ug | ORAL_TABLET | Freq: Every day | ORAL | Status: DC

## 2013-11-29 ENCOUNTER — Ambulatory Visit (HOSPITAL_COMMUNITY): Admission: RE | Admit: 2013-11-29 | Payer: Managed Care, Other (non HMO) | Source: Ambulatory Visit

## 2013-11-29 ENCOUNTER — Other Ambulatory Visit (HOSPITAL_COMMUNITY): Payer: Self-pay | Admitting: Oncology

## 2013-11-29 DIAGNOSIS — E039 Hypothyroidism, unspecified: Secondary | ICD-10-CM

## 2013-11-29 MED ORDER — LEVOTHYROXINE SODIUM 50 MCG PO TABS: 100.0000 ug | ORAL_TABLET | Freq: Every day | ORAL | Status: DC

## 2013-12-04 ENCOUNTER — Encounter (HOSPITAL_BASED_OUTPATIENT_CLINIC_OR_DEPARTMENT_OTHER): Payer: Managed Care, Other (non HMO)

## 2013-12-04 ENCOUNTER — Encounter (HOSPITAL_COMMUNITY): Payer: Self-pay

## 2013-12-04 DIAGNOSIS — Z85118 Personal history of other malignant neoplasm of bronchus and lung: Secondary | ICD-10-CM | POA: Diagnosis not present

## 2013-12-04 DIAGNOSIS — C799 Secondary malignant neoplasm of unspecified site: Secondary | ICD-10-CM

## 2013-12-04 DIAGNOSIS — C7800 Secondary malignant neoplasm of unspecified lung: Secondary | ICD-10-CM

## 2013-12-04 DIAGNOSIS — Z5112 Encounter for antineoplastic immunotherapy: Secondary | ICD-10-CM

## 2013-12-04 DIAGNOSIS — C3412 Malignant neoplasm of upper lobe, left bronchus or lung: Secondary | ICD-10-CM

## 2013-12-04 LAB — CBC WITH DIFFERENTIAL/PLATELET
Basophils Absolute: 0.1 10*3/uL (ref 0.0–0.1)
Basophils Relative: 1 % (ref 0–1)
Eosinophils Absolute: 0.4 10*3/uL (ref 0.0–0.7)
Eosinophils Relative: 8 % — ABNORMAL HIGH (ref 0–5)
HCT: 34.2 % — ABNORMAL LOW (ref 39.0–52.0)
HEMOGLOBIN: 11.4 g/dL — AB (ref 13.0–17.0)
LYMPHS ABS: 0.7 10*3/uL (ref 0.7–4.0)
LYMPHS PCT: 13 % (ref 12–46)
MCH: 30.6 pg (ref 26.0–34.0)
MCHC: 33.3 g/dL (ref 30.0–36.0)
MCV: 91.7 fL (ref 78.0–100.0)
Monocytes Absolute: 0.4 10*3/uL (ref 0.1–1.0)
Monocytes Relative: 7 % (ref 3–12)
NEUTROS ABS: 3.6 10*3/uL (ref 1.7–7.7)
NEUTROS PCT: 71 % (ref 43–77)
Platelets: 193 10*3/uL (ref 150–400)
RBC: 3.73 MIL/uL — AB (ref 4.22–5.81)
RDW: 14 % (ref 11.5–15.5)
WBC: 5.1 10*3/uL (ref 4.0–10.5)

## 2013-12-04 MED ORDER — SODIUM CHLORIDE 0.9 % IV SOLN
3.0000 mg/kg | Freq: Once | INTRAVENOUS | Status: AC
  Administered 2013-12-04: 190 mg via INTRAVENOUS
  Filled 2013-12-04: qty 19

## 2013-12-04 MED ORDER — HEPARIN SOD (PORK) LOCK FLUSH 100 UNIT/ML IV SOLN
500.0000 [IU] | Freq: Once | INTRAVENOUS | Status: AC | PRN
  Administered 2013-12-04: 500 [IU]
  Filled 2013-12-04: qty 5

## 2013-12-04 MED ORDER — SODIUM CHLORIDE 0.9 % IV SOLN
Freq: Once | INTRAVENOUS | Status: AC
  Administered 2013-12-04: 11:00:00 via INTRAVENOUS

## 2013-12-04 MED ORDER — SODIUM CHLORIDE 0.9 % IJ SOLN: 10.0000 mL | INTRAMUSCULAR | Status: DC | PRN

## 2013-12-04 NOTE — Progress Notes (Signed)
[name] Tolerated chemotherapy well today.  Discharged ambulatory

## 2013-12-04 NOTE — Patient Instructions (Signed)
[facility] Discharge Instructions for Patients Receiving Chemotherapy  Today you received the following chemotherapy agents opdivo You can take anything over the counter for your sinuses Please call the clinic if you have any questions or conerns  To help prevent nausea and vomiting after your treatment, we encourage you to take your nausea medication If you develop nausea and vomiting that is not controlled by your nausea medication, call the clinic. If it is after clinic hours your family physician or the after hours number for the clinic or go to the Emergency Department.   BELOW ARE SYMPTOMS THAT SHOULD BE REPORTED IMMEDIATELY:  *FEVER GREATER THAN 101.0 F  *CHILLS WITH OR WITHOUT FEVER  NAUSEA AND VOMITING THAT IS NOT CONTROLLED WITH YOUR NAUSEA MEDICATION  *UNUSUAL SHORTNESS OF BREATH  *UNUSUAL BRUISING OR BLEEDING  TENDERNESS IN MOUTH AND THROAT WITH OR WITHOUT PRESENCE OF ULCERS  *URINARY PROBLEMS  *BOWEL PROBLEMS  UNUSUAL RASH Items with * indicate a potential emergency and should be followed up as soon as possible.  One of the nurses will contact you 24 hours after your treatment. Please let the nurse know about any problems that you may have experienced. Feel free to call the clinic you have any questions or concerns. The clinic phone number is [phone].   I have been informed and understand all the instructions given to me. I know to contact the clinic, my physician, or go to the Emergency Department if any problems should occur. I do not have any questions at this time, but understand that I may call the clinic during office hours or the Patient Navigator at [phone] should I have any questions or need assistance in obtaining follow up care.   Nivolumab injection What is this medicine? NIVOLUMAB (nye VOL ue mab) is used to treat certain types of melanoma and lung cancer. This medicine may be used for other purposes; ask your health care  provider or pharmacist if you have questions. COMMON BRAND NAME(S): Opdivo What should I tell my health care provider before I take this medicine? They need to know if you have any of these conditions: -eye disease, vision problems -history of pancreatitis -immune system problems -inflammatory bowel disease -kidney disease -liver disease -lung disease -lupus -myasthenia gravis -multiple sclerosis -organ transplant -stomach or intestine problems -thyroid disease -tingling of the fingers or toes, or other nerve disorder -an unusual or allergic reaction to nivolumab, other medicines, foods, dyes, or preservatives -pregnant or trying to get pregnant -breast-feeding How should I use this medicine? This medicine is for infusion into a vein. It is given by a health care professional in a hospital or clinic setting. A special MedGuide will be given to you before each treatment. Be sure to read this information carefully each time. Talk to your pediatrician regarding the use of this medicine in children. Special care may be needed. Overdosage: If you think you've taken too much of this medicine contact a poison control center or emergency room at once. Overdosage: If you think you have taken too much of this medicine contact a poison control center or emergency room at once. NOTE: This medicine is only for you. Do not share this medicine with others. What if I miss a dose? It is important not to miss your dose. Call your doctor or health care professional if you are unable to keep an appointment. What may interact with this medicine? Interactions have not been studied. This list may not describe all possible  interactions. Give your health care provider a list of all the medicines, herbs, non-prescription drugs, or dietary supplements you use. Also tell them if you smoke, drink alcohol, or use illegal drugs. Some items may interact with your medicine. What should I watch for while using this  medicine? Tell your doctor or healthcare professional if your symptoms do not start to get better or if they get worse. Your condition will be monitored carefully while you are receiving this medicine. You may need blood work done while you are taking this medicine. What side effects may I notice from receiving this medicine? Side effects that you should report to your doctor or health care professional as soon as possible: -allergic reactions like skin rash, itching or hives, swelling of the face, lips, or tongue -black, tarry stools -bloody or watery diarrhea -changes in vision -chills -cough -depressed mood -eye pain -feeling anxious -fever -general ill feeling or flu-like symptoms -hair loss -loss of appetite -low blood counts - this medicine may decrease the number of white blood cells, red blood cells and platelets. You may be at increased risk for infections and bleeding -pain, tingling, numbness in the hands or feet -redness, blistering, peeling or loosening of the skin, including inside the mouth -red pinpoint spots on skin -signs of decreased platelets or bleeding - bruising, pinpoint red spots on the skin, black, tarry stools, blood in the urine -signs of decreased red blood cells - unusually weak or tired, feeling faint or lightheaded, falls -signs of infection - fever or chills, cough, sore throat, pain or trouble passing urine -signs and symptoms of a dangerous change in heartbeat or heart rhythm like chest pain; dizziness; fast or irregular heartbeat; palpitations; feeling faint or lightheaded, falls; breathing problems -signs and symptoms of high blood sugar such as dizziness; dry mouth; dry skin; fruity breath; nausea; stomach pain; increased hunger or thirst; increased urination -signs and symptoms of kidney injury like trouble passing urine or change in the amount of urine -signs and symptoms of liver injury like dark yellow or brown urine; general ill feeling or  flu-like symptoms; light-colored stools; loss of appetite; nausea; right upper belly pain; unusually weak or tired; yellowing of the eyes or skin -signs and symptoms of increased potassium like muscle weakness; chest pain; or fast, irregular heartbeat -signs and symptoms of low potassium like muscle cramps or muscle pain; chest pain; dizziness; feeling faint or lightheaded, falls; palpitations; breathing problems; or fast, irregular heartbeat -swelling of the ankles, feet, hands -weight gainSide effects that usually do not require medical attention (report to your doctor or health care professional if they continue or are bothersome): -constipation -general ill feeling or flu-like symptoms -hair loss -loss of appetite -nausea, vomiting This list may not describe all possible side effects. Call your doctor for medical advice about side effects. You may report side effects to FDA at 1-800-FDA-1088. Where should I keep my medicine? This drug is given in a hospital or clinic and will not be stored at home. NOTE: This sheet is a summary. It may not cover all possible information. If you have questions about this medicine, talk to your doctor, pharmacist, or health care provider.  2015, [employer]vier/[employer] Standard. (2013-03-25 13:18:19)

## 2013-12-09 ENCOUNTER — Ambulatory Visit (HOSPITAL_COMMUNITY)
Admission: RE | Admit: 2013-12-09 | Discharge: 2013-12-09 | Disposition: A | Discharge status: Home / Self Care | Payer: Managed Care, Other (non HMO) | Source: Ambulatory Visit | Attending: Neurosurgery | Admitting: Neurosurgery

## 2013-12-09 DIAGNOSIS — G939 Disorder of brain, unspecified: Secondary | ICD-10-CM

## 2013-12-09 DIAGNOSIS — C7932 Secondary malignant neoplasm of cerebral meninges: Secondary | ICD-10-CM | POA: Insufficient documentation

## 2013-12-09 DIAGNOSIS — I6200 Nontraumatic subdural hemorrhage, unspecified: Secondary | ICD-10-CM | POA: Diagnosis not present

## 2013-12-09 DIAGNOSIS — C7800 Secondary malignant neoplasm of unspecified lung: Secondary | ICD-10-CM | POA: Diagnosis not present

## 2013-12-09 MED ORDER — GADOBENATE DIMEGLUMINE 529 MG/ML IV SOLN
5.0000 mL | Freq: Once | INTRAVENOUS | Status: AC | PRN
  Administered 2013-12-09: 5 mL via INTRAVENOUS

## 2013-12-15 NOTE — Progress Notes (Signed)
Jeffrey Battiest, MD [place] [address]  Metastatic cancer - Plan: HYDROcodone-acetaminophen (NORCO/VICODIN) 5-325 MG per tablet, CT Chest W Contrast, CT Abdomen Pelvis W Contrast  Meningeal irritation  CURRENT THERAPY: Salvage treatment with Nivolumab (immunotherapy) beginning on 07/31/2013   INTERVAL HISTORY: Jeffrey [name] 80 y.o. 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 From 07/31/2012 until 11/12/2012 patient received 6 cycles of carboplatin and Taxol because of enlargement of pulmonary nodules. He was placed on maintenance Tarceva on 12/10/2012 which was stopped after repeat CT scan on 05/01/2013 showed progression of disease. Patient not a candidate for SBRT. Therefore, he started Nivolumab on 07/31/2013.    Metastatic cancer   02/17/2011 Imaging PET scan- B/L hypermetabolic pulmonary nodules (3 in total) are most consistent with metastatic disease. No evidence of mediastinal nodal metastasis.  No evidence of metastasis in the abdomen or pelvis.   06/19/2012 Imaging CT scan of chest- Slight enlargement of pulmonary nodules/metastasis.    07/31/2012 - 11/12/2012 Chemotherapy Carboplatin/Paclitaxel x 6 cycles   11/26/2012 Imaging CT CAP- Similar size of a R apical pulm nodule.  Similar to improved left lower lobe nodularity, favored to be post infectious or inflammatory. Similar nodularity in the right middle lobe. No evidence of new or progressive disease. Similar borderline adeno    12/10/2012 - 05/07/2013 Chemotherapy Tarceva 150 mg daily   05/03/2013 Progression CT CAP- Interval increase in size of right upper lobe adjacent pulmonary nodules is highly concerning for progression of lung cancer recurrence.   07/31/2013 -   Chemotherapy Nivolumab   10/21/2013 Imaging CT CAP- The dominant right apical mass is slightly increased in size from previous exam. The adjacent nodule is stable.   10/31/2013 Imaging CT of head- Intermediate density subdural collection/hematoma along the convexity on the right, maximal thickness 7 mm. 2 mm of right-to-left shift. No hyper acute hemorrhage.  Lytic lesion right posterior frontal calvarium, new since the previous study.   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 Widespread underlying dural thickening and enhancement in the R hemisphere suspicious for meningeal involvement.     I personally reviewed and went over laboratory results with the patient.  The results are noted within this dictation.  I personally reviewed and went over radiographic studies with the patient.  The results are noted within this dictation.  MRI of brain ordered by Dr. Newman [name] on 12/09/2013 demonstrated the following information: 1. 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 Widespread underlying dural thickening and enhancement in the right hemisphere suspicious for pachymeningeal spread of metastasis in this setting. 2. Underlying right subdural hematoma is stable, perhaps somehow this is secondary to the metastatic process. Associated mass effect is stable. 3. No other parenchymal brain metastasis identified. No other skull metastasis identified.  With this concerning information, further work-up is warranted.  I provided education to the patient regarding CSF evaluation with spinal tap.  I explained the procedure to him.  He is not taking ASA due to noncompliance.  The role of this test is to test for malignancy spread into CSF.  Additionally, he will need to undergo restaging  of CAP to evaluate response  to treatment outside of the cranial vault. Depending on this data, he may benefit from a biopsy of cranial lesion by IR.  He is agreeable to this plan, but he is not sure if he wants treatment if this is positive for malignancy.  I think this is reasonable, but we need diagnostic testing first.  Oncologically, he denies any complaints and ROS questioning is negative.   Past Medical History  Diagnosis Date  . Tracheostomy in place   . Cancer 2010    laryngeal  . Lung cancer 2010  . Pneumonia 2014  . Dysrhythmia     takes Coreg and Lisinopril daily  . Shortness of breath     with exertion  . Enlarged prostate     takes flomax  . Urinary urgency   . Gastric ulcer     hx of  . Hx of transfusion of packed red blood cells     no abnormal reaction noted by patient  . Umbilical hernia   . Metastatic cancer 07/24/2012  . Hyperlipidemia   . ED (erectile dysfunction)   . Insomnia   . CHF (congestive heart failure)     has TOBACCO ABUSE; EAR PAIN, LEFT; C O P D; NECK PAIN, LEFT; POSTNASAL DRIP SYNDROME; UNSPECIFIED RESPIRATORY ABNORMALITY; History of laryngeal cancer; History of lung cancer; Tracheostomy in place; Elevated brain natriuretic peptide (BNP) level; Pulmonary nodules; Bilateral hydronephrosis; Cardiomyopathy; Metastatic cancer; Prostate hypertrophy; and Insomnia on his problem list.     has No Known Allergies.  Jeffrey [name] had no medications administered during this visit.  Past Surgical History  Procedure Laterality Date  . Tracheostomy  2010  . Lung removal, partial  2010    left side  . Layngectomy for laryngeal cancer in2010; left upper lobectomy for lung cancer, 2010.    . Back surgery      thinks it was done in 2012  . Esophagogastroduodenoscopy    . Colonoscopy    . Portacath placement Bilateral 07/12/2012    Procedure: INSERTION PORT-A-CATH;  Surgeon: Jeffrey Poot, MD;  Location: [facility] OR;  Service: Thoracic;  Laterality: Bilateral;    Denies any dizziness,  double vision, fevers, chills, night sweats, nausea, vomiting, diarrhea, constipation, chest pain, heart palpitations, shortness of breath, blood in stool, black tarry stool, urinary pain, urinary burning, urinary frequency, hematuria.   PHYSICAL EXAMINATION  ECOG PERFORMANCE STATUS: 1 - Symptomatic but completely ambulatory  There were no vitals filed for this visit.  GENERAL:alert, no distress, well nourished, well developed, comfortable, cooperative and smiling, accompanied by his mother-in-law. SKIN: skin color, texture, turgor are normal, no rashes or significant lesions HEAD: Normocephalic, No masses, lesions, tenderness or abnormalities EYES: normal, PERRLA, EOMI, Conjunctiva are pink and non-injected EARS: External ears normal OROPHARYNX:mucous membranes are moist  NECK: supple, trachea midline LYMPH:  not examined BREAST:not examined LUNGS: not examined HEART: not examined ABDOMEN:not examined BACK: Back symmetric, no curvature. EXTREMITIES:less then 2 second capillary refill, no skin discoloration, no cyanosis  NEURO: alert & oriented x 3 with fluent speech, no focal motor/sensory deficits, gait normal    LABORATORY DATA: CBC    Component Value Date/Time   WBC 5.1 12/18/2013 0957   RBC 3.52* 12/18/2013 0957   HGB 10.6* 12/18/2013 0957   HCT 32.1* 12/18/2013 0957   PLT 204 12/18/2013 0957   MCV 91.2 12/18/2013 0957   MCH 30.1 12/18/2013 0957   MCHC 33.0 12/18/2013 0957   RDW 14.0 12/18/2013 0957  LYMPHSABS 0.6* 12/18/2013 0957   MONOABS 0.3 12/18/2013 0957   EOSABS 0.5 12/18/2013 0957   BASOSABS 0.1 12/18/2013 0957      Chemistry      Component Value Date/Time   NA 138 12/18/2013 0957   K 4.7 12/18/2013 0957   CL 103 12/18/2013 0957   CO2 23 12/18/2013 0957   BUN 25* 12/18/2013 0957   CREATININE 1.99* 12/18/2013 0957   CREATININE 1.46* 11/12/2012 0839      Component Value Date/Time   CALCIUM 8.8 12/18/2013 0957   CALCIUM 8.5 01/26/2012 1312    ALKPHOS 83 12/18/2013 0957   AST 11 12/18/2013 0957   ALT 7 12/18/2013 0957   BILITOT 0.2* 12/18/2013 0957     Lab Results  Component Value Date   TSH 0.446 11/20/2013     RADIOGRAPHIC STUDIES:  12/09/2013  CLINICAL DATA: 80 year old male with right side skull pain for 2 months, destructive right sphenoid wing/temporal bone lesion detected on CT in October. Current history of metastatic squamous cell lung carcinoma. Subsequent encounter.  EXAM: MRI HEAD WITHOUT AND WITH CONTRAST  TECHNIQUE: Multiplanar, multiecho pulse sequences of the brain and surrounding structures were obtained without and with intravenous contrast.  CONTRAST: 51mL MULTIHANCE GADOBENATE DIMEGLUMINE 529 MG/ML IV SOLN  COMPARISON: Head CT without and with contrast 10/31/2013. Brain MRI 03/04/2008.  FINDINGS: Enhancing in destructive bone lesion corresponding to the recent CT finding measures 27 mm diameter, and shows invasion of the overlying scalp soft tissues. See series 9, image 14). There is associated abnormal widespread dural thickening and enhancement in the right hemisphere (pachymeningeal involvement). See series 11, image 13). Subjacent to the abnormal dural is a persistent fluid collection most compatible with hematoma (not isointense to CSF see series 6, image 18) which itself measures up to 9 mm in thickness.  No dural thickening or hyper enhancement in the left hemisphere. Some post-contrast images are degraded by motion. No intra-axial brain metastasis or abnormal enhancement identified. No cerebral edema identified. Mild leftward midline shift is stable. No other suspicious calvarium lesion identified. Normal bone marrow signal at the skullbase or in the visualized upper cervical spine.  No restricted diffusion or evidence of acute infarction. No ventriculomegaly. Normal basilar cisterns. Negative pituitary and cervicomedullary junction. Major intracranial vascular flow  voids are preserved. No new intracranial hemorrhage identified. Mild for age nonspecific periventricular white matter T2 and FLAIR hyperintensity.  Left phthisis bulbi. Postoperative changes to the right globe. Visualized paranasal sinuses and mastoids are clear.  IMPRESSION: 1. 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 Widespread underlying dural thickening and enhancement in the right hemisphere suspicious for pachymeningeal spread of metastasis in this setting. 2. Underlying right subdural hematoma is stable, perhaps somehow this is secondary to the metastatic process. Associated mass effect is stable. 3. No other parenchymal brain metastasis identified. No other skull metastasis identified.   Electronically Signed  By: Lars Pinks M.D.  On: 12/09/2013 10:35     ASSESSMENT:  1. 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 From 07/31/2012 until 11/12/2012 patient received 6 cycles of carboplatin and Taxol because of enlargement of pulmonary nodules. He was placed on maintenance Tarceva on 12/10/2012 which was stopped after repeat CT scan on 05/01/2013 showed progression of disease. Patient  not a candidate for SBRT. Therefore, he started Nivolumab on 07/31/2013, tolerating well.  2. Cardiomyopathy, stable, without symptoms or signs of heart failure or dysrhythmia.  3. 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 Chronic obstructive pulmonary disease, no longer smoking.  5. 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 6. Right calvarial lytic metastasis involving the  underlying dura and scalp soft tissue increasing since October 2015 imaging.  Widespread dural thickening and enhancement in right hemisphere concerning for meningeal spread. Clinically stable without mental status changes and headaches.  Patient Active Problem List   Diagnosis Date Noted  . Prostate hypertrophy 11/21/2012  . Insomnia 11/21/2012  . Metastatic cancer 07/24/2012  . Cardiomyopathy 01/27/2012  . History of laryngeal cancer 01/26/2012  . History of lung cancer 01/26/2012  . Tracheostomy in place 01/26/2012  . Elevated brain natriuretic peptide (BNP) level 01/26/2012  . Pulmonary nodules 01/26/2012  . Bilateral hydronephrosis 01/26/2012  . TOBACCO ABUSE 02/28/2008  . EAR PAIN, LEFT 02/28/2008  . C O P D 02/28/2008  . NECK PAIN, LEFT 02/28/2008  . POSTNASAL DRIP SYNDROME 02/28/2008  . UNSPECIFIED RESPIRATORY ABNORMALITY 02/28/2008     PLAN:  1. I personally reviewed and went over laboratory results with the patient.  The results are noted within this dictation. 2. I personally reviewed and went over radiographic studies with the patient.  The results are noted within this dictation.   3. Chart reviewed 4. Rx for Vicodin 5. CSF evaluation by radiology:  A. CSF cell count with diff  B. Glucose  C. Protein  D. LDH  E. CSF Culture  F. Gram stain  G. Cytology 6. CT CAP with contrast within the next 2 weeks.  7. Return as scheduled in 2 weeks.    THERAPY PLAN:  He will receive treatment with Nivolumab today and we will work-up new MRI of brain findings with CSF evaluation and restaging scans with CT CAP.  All questions were answered. The patient knows to call the clinic with any problems, questions or concerns. We can certainly see the patient much sooner if necessary.  Patient and plan discussed with Dr. Farrel Gobble and he is in agreement with the aforementioned.   KEFALAS,THOMAS 12/18/2013

## 2013-12-18 ENCOUNTER — Encounter (HOSPITAL_COMMUNITY): Payer: Managed Care, Other (non HMO) | Attending: Oncology

## 2013-12-18 ENCOUNTER — Encounter (HOSPITAL_COMMUNITY): Payer: Self-pay | Admitting: Oncology

## 2013-12-18 ENCOUNTER — Encounter (HOSPITAL_BASED_OUTPATIENT_CLINIC_OR_DEPARTMENT_OTHER): Payer: Managed Care, Other (non HMO) | Admitting: Oncology

## 2013-12-18 DIAGNOSIS — C7989 Secondary malignant neoplasm of other specified sites: Secondary | ICD-10-CM

## 2013-12-18 DIAGNOSIS — R918 Other nonspecific abnormal finding of lung field: Secondary | ICD-10-CM | POA: Insufficient documentation

## 2013-12-18 DIAGNOSIS — Z5112 Encounter for antineoplastic immunotherapy: Secondary | ICD-10-CM

## 2013-12-18 DIAGNOSIS — Z85118 Personal history of other malignant neoplasm of bronchus and lung: Secondary | ICD-10-CM | POA: Diagnosis present

## 2013-12-18 DIAGNOSIS — G620 Drug-induced polyneuropathy: Secondary | ICD-10-CM

## 2013-12-18 DIAGNOSIS — C7802 Secondary malignant neoplasm of left lung: Secondary | ICD-10-CM

## 2013-12-18 DIAGNOSIS — C801 Malignant (primary) neoplasm, unspecified: Secondary | ICD-10-CM | POA: Insufficient documentation

## 2013-12-18 DIAGNOSIS — C799 Secondary malignant neoplasm of unspecified site: Secondary | ICD-10-CM

## 2013-12-18 DIAGNOSIS — C7801 Secondary malignant neoplasm of right lung: Secondary | ICD-10-CM

## 2013-12-18 DIAGNOSIS — R291 Meningismus: Secondary | ICD-10-CM

## 2013-12-18 DIAGNOSIS — C3412 Malignant neoplasm of upper lobe, left bronchus or lung: Secondary | ICD-10-CM

## 2013-12-18 LAB — COMPREHENSIVE METABOLIC PANEL
ALBUMIN: 3.4 g/dL — AB (ref 3.5–5.2)
ALT: 7 U/L (ref 0–53)
AST: 11 U/L (ref 0–37)
Alkaline Phosphatase: 83 U/L (ref 39–117)
Anion gap: 12 (ref 5–15)
BUN: 25 mg/dL — ABNORMAL HIGH (ref 6–23)
CALCIUM: 8.8 mg/dL (ref 8.4–10.5)
CHLORIDE: 103 meq/L (ref 96–112)
CO2: 23 meq/L (ref 19–32)
Creatinine, Ser: 1.99 mg/dL — ABNORMAL HIGH (ref 0.50–1.35)
GFR calc Af Amer: 35 mL/min — ABNORMAL LOW (ref >90)
GFR, EST NON AFRICAN AMERICAN: 30 mL/min — AB (ref >90)
Glucose, Bld: 113 mg/dL — ABNORMAL HIGH (ref 70–99)
Potassium: 4.7 mEq/L (ref 3.7–5.3)
SODIUM: 138 meq/L (ref 137–147)
Total Bilirubin: 0.2 mg/dL — ABNORMAL LOW (ref 0.3–1.2)
Total Protein: 7.3 g/dL (ref 6.0–8.3)

## 2013-12-18 LAB — CBC WITH DIFFERENTIAL/PLATELET
BASOS ABS: 0.1 10*3/uL (ref 0.0–0.1)
BASOS PCT: 1 % (ref 0–1)
Eosinophils Absolute: 0.5 10*3/uL (ref 0.0–0.7)
Eosinophils Relative: 10 % — ABNORMAL HIGH (ref 0–5)
HCT: 32.1 % — ABNORMAL LOW (ref 39.0–52.0)
HEMOGLOBIN: 10.6 g/dL — AB (ref 13.0–17.0)
Lymphocytes Relative: 12 % (ref 12–46)
Lymphs Abs: 0.6 10*3/uL — ABNORMAL LOW (ref 0.7–4.0)
MCH: 30.1 pg (ref 26.0–34.0)
MCHC: 33 g/dL (ref 30.0–36.0)
MCV: 91.2 fL (ref 78.0–100.0)
Monocytes Absolute: 0.3 10*3/uL (ref 0.1–1.0)
Monocytes Relative: 5 % (ref 3–12)
NEUTROS ABS: 3.7 10*3/uL (ref 1.7–7.7)
Neutrophils Relative %: 72 % (ref 43–77)
PLATELETS: 204 10*3/uL (ref 150–400)
RBC: 3.52 MIL/uL — AB (ref 4.22–5.81)
RDW: 14 % (ref 11.5–15.5)
WBC: 5.1 10*3/uL (ref 4.0–10.5)

## 2013-12-18 LAB — TSH: TSH: 0.795 u[IU]/mL (ref 0.350–4.500)

## 2013-12-18 MED ORDER — SODIUM CHLORIDE 0.9 % IJ SOLN
10.0000 mL | INTRAMUSCULAR | Status: DC | PRN
  Administered 2013-12-18: 10 mL
  Filled 2013-12-18: qty 10

## 2013-12-18 MED ORDER — SODIUM CHLORIDE 0.9 % IV SOLN
Freq: Once | INTRAVENOUS | Status: AC
  Administered 2013-12-18: 10:00:00 via INTRAVENOUS

## 2013-12-18 MED ORDER — SODIUM CHLORIDE 0.9 % IV SOLN
3.0000 mg/kg | Freq: Once | INTRAVENOUS | Status: AC
  Administered 2013-12-18: 190 mg via INTRAVENOUS
  Filled 2013-12-18: qty 19

## 2013-12-18 MED ORDER — HYDROCODONE-ACETAMINOPHEN 5-325 MG PO TABS: 1.0000 | ORAL_TABLET | Freq: Four times a day (QID) | ORAL | Status: DC | PRN

## 2013-12-18 MED ORDER — HEPARIN SOD (PORK) LOCK FLUSH 100 UNIT/ML IV SOLN
500.0000 [IU] | Freq: Once | INTRAVENOUS | Status: AC | PRN
  Administered 2013-12-18: 500 [IU]
  Filled 2013-12-18: qty 5

## 2013-12-18 NOTE — Progress Notes (Signed)
Tolerated chemo well. 

## 2013-12-18 NOTE — Patient Instructions (Signed)
Jeffrey [name] Discharge Instructions  RECOMMENDATIONS MADE BY THE CONSULTANT AND ANY TEST RESULTS WILL BE SENT TO YOUR REFERRING PHYSICIAN.  I have reviewed lab work.  Thyroid test is pending and we will adjust dose of Levothyroxine if needed.  I reviewed and provided you a copy of the MRI of brain report.  With MRI information, he needs diagnostic testing with CSF evaluation.  Hold Aspirin at home. Additionally, we will set-up CT chest, abdomen, and pelvis to see how your cancer is responding to therapy thus far. Return in 2 weeks for follow-up and to review information collected.  Please call the [facility] with any questions or concerns.   Thank you for choosing [facility] to provide your oncology and hematology care.  To afford each patient quality time with our providers, please arrive at least 15 minutes before your scheduled appointment time.  With your help, our goal is to use those 15 minutes to complete the necessary work-up to ensure our physicians have the information they need to help with your evaluation and healthcare recommendations.    Effective January 1st, 2014, we ask that you re-schedule your appointment with our physicians should you arrive 10 or more minutes late for your appointment.  We strive to give you quality time with our providers, and arriving late affects you and other patients whose appointments are after yours.    Again, thank you for choosing [facility].  Our hope is that these requests will decrease the amount of time that you wait before being seen by our physicians.       _____________________________________________________________  Should you have questions after your visit to [facility], please contact our office at [phone] between the hours of 8:30 a.m. and 5:00 p.m.  Voicemails left after 4:30 p.m. will not be returned until the following business day.  For prescription  refill requests, have your pharmacy contact our office with your prescription refill request.

## 2013-12-18 NOTE — Patient Instructions (Signed)
[facility] Discharge Instructions for Patients Receiving Chemotherapy  Today you received the following chemotherapy agents Opdivo.  To help prevent nausea and vomiting after your treatment, we encourage you to take your nausea medication as instructed.   If you develop nausea and vomiting that is not controlled by your nausea medication, call the clinic. If it is after clinic hours your family physician or the after hours number for the clinic or go to the Emergency Department.   BELOW ARE SYMPTOMS THAT SHOULD BE REPORTED IMMEDIATELY:  *FEVER GREATER THAN 101.0 F  *CHILLS WITH OR WITHOUT FEVER  NAUSEA AND VOMITING THAT IS NOT CONTROLLED WITH YOUR NAUSEA MEDICATION  *UNUSUAL SHORTNESS OF BREATH  *UNUSUAL BRUISING OR BLEEDING  TENDERNESS IN MOUTH AND THROAT WITH OR WITHOUT PRESENCE OF ULCERS  *URINARY PROBLEMS  *BOWEL PROBLEMS  UNUSUAL RASH Items with * indicate a potential emergency and should be followed up as soon as possible.  We will schedule you for a CT scan and you also will need a spinal tap. We will see you back in 2 weeks for MD appointment +/- chemo. Return as scheduled.   I have been informed and understand all the instructions given to me. I know to contact the clinic, my physician, or go to the Emergency Department if any problems should occur. I do not have any questions at this time, but understand that I may call the clinic during office hours or the Patient Navigator at [phone] should I have any questions or need assistance in obtaining follow up care.    __________________________________________  _____________  __________ Signature of Patient or Authorized Representative            Date                   Time    __________________________________________ Nurse's Signature

## 2013-12-20 ENCOUNTER — Other Ambulatory Visit (HOSPITAL_COMMUNITY): Payer: Managed Care, Other (non HMO)

## 2013-12-20 ENCOUNTER — Encounter (HOSPITAL_COMMUNITY): Payer: Managed Care, Other (non HMO)

## 2013-12-24 ENCOUNTER — Ambulatory Visit (HOSPITAL_COMMUNITY)
Admission: RE | Admit: 2013-12-24 | Discharge: 2013-12-24 | Disposition: A | Discharge status: Home / Self Care | Payer: Managed Care, Other (non HMO) | Source: Ambulatory Visit | Attending: Oncology | Admitting: Oncology

## 2013-12-24 ENCOUNTER — Other Ambulatory Visit (HOSPITAL_COMMUNITY): Payer: Self-pay | Admitting: Oncology

## 2013-12-24 DIAGNOSIS — Z08 Encounter for follow-up examination after completed treatment for malignant neoplasm: Secondary | ICD-10-CM | POA: Insufficient documentation

## 2013-12-24 DIAGNOSIS — N1330 Unspecified hydronephrosis: Secondary | ICD-10-CM | POA: Diagnosis not present

## 2013-12-24 DIAGNOSIS — R590 Localized enlarged lymph nodes: Secondary | ICD-10-CM | POA: Diagnosis not present

## 2013-12-24 DIAGNOSIS — J432 Centrilobular emphysema: Secondary | ICD-10-CM | POA: Insufficient documentation

## 2013-12-24 DIAGNOSIS — C799 Secondary malignant neoplasm of unspecified site: Secondary | ICD-10-CM

## 2013-12-24 DIAGNOSIS — N320 Bladder-neck obstruction: Secondary | ICD-10-CM | POA: Diagnosis not present

## 2013-12-24 DIAGNOSIS — C801 Malignant (primary) neoplasm, unspecified: Secondary | ICD-10-CM | POA: Diagnosis not present

## 2013-12-24 LAB — POCT I-STAT CREATININE: Creatinine, Ser: 2.3 mg/dL — ABNORMAL HIGH (ref 0.50–1.35)

## 2013-12-26 ENCOUNTER — Encounter (HOSPITAL_COMMUNITY): Payer: Self-pay

## 2013-12-26 ENCOUNTER — Ambulatory Visit (HOSPITAL_COMMUNITY)
Admission: RE | Admit: 2013-12-26 | Discharge: 2013-12-26 | Disposition: A | Discharge status: Home / Self Care | Payer: Managed Care, Other (non HMO) | Source: Ambulatory Visit | Attending: Diagnostic Radiology | Admitting: Diagnostic Radiology

## 2013-12-26 ENCOUNTER — Ambulatory Visit (HOSPITAL_COMMUNITY)
Admission: RE | Admit: 2013-12-26 | Discharge: 2013-12-26 | Disposition: A | Discharge status: Home / Self Care | Payer: Managed Care, Other (non HMO) | Source: Ambulatory Visit | Attending: Oncology | Admitting: Oncology

## 2013-12-26 ENCOUNTER — Telehealth (HOSPITAL_COMMUNITY): Payer: Self-pay | Admitting: *Deleted

## 2013-12-26 ENCOUNTER — Other Ambulatory Visit (HOSPITAL_COMMUNITY): Payer: Self-pay | Admitting: Oncology

## 2013-12-26 ENCOUNTER — Other Ambulatory Visit: Payer: Self-pay | Admitting: Diagnostic Radiology

## 2013-12-26 DIAGNOSIS — R291 Meningismus: Secondary | ICD-10-CM

## 2013-12-26 DIAGNOSIS — Z8521 Personal history of malignant neoplasm of larynx: Secondary | ICD-10-CM | POA: Insufficient documentation

## 2013-12-26 DIAGNOSIS — Z85118 Personal history of other malignant neoplasm of bronchus and lung: Secondary | ICD-10-CM | POA: Insufficient documentation

## 2013-12-26 LAB — CSF CELL COUNT WITH DIFFERENTIAL
RBC Count, CSF: 6 /mm3 — ABNORMAL HIGH
TUBE #: 4
WBC, CSF: 1 /mm3 (ref 0–5)

## 2013-12-26 LAB — PROTEIN, CSF: TOTAL PROTEIN, CSF: 36 mg/dL (ref 15–45)

## 2013-12-26 LAB — GLUCOSE, CSF: Glucose, CSF: 63 mg/dL (ref 43–76)

## 2013-12-26 MED ORDER — LIDOCAINE HCL (PF) 1 % IJ SOLN
INTRAMUSCULAR | Status: AC
  Filled 2013-12-26: qty 5

## 2013-12-26 NOTE — Progress Notes (Signed)
Pt discharged from Short Stay with his wife. VSS. Pt denies pain. Insertion site observed. Bandaid intact, no bleeding or swelling seen.

## 2013-12-26 NOTE — Progress Notes (Addendum)
Pt Arrived to Short stay/Phase II via [place] following lumbar puncture. Pt denies discomfort.  Bandaid dry.  Pt Lying flat.  Family member at bedside.  Warm blankets applied.  Pt sleeping at intervals.  Denies discomfort.

## 2013-12-26 NOTE — Discharge Instructions (Signed)
Lumbar Puncture, Care After Refer to this sheet in the next few weeks. These instructions provide you with information on caring for yourself after your procedure. Your health care provider may also give you more specific instructions. Your treatment has been planned according to current medical practices, but problems sometimes occur. Call your health care provider if you have any problems or questions after your procedure. WHAT TO EXPECT AFTER THE PROCEDURE After your procedure, it is typical to have the following sensations:  Mild discomfort or pain at the insertion site.  Mild headache that is relieved with pain medicines. HOME CARE INSTRUCTIONS  Avoid lifting anything heavier than 10 lb (4.5 kg) for at least 12 hours after the procedure.  Drink enough fluids to keep your urine clear or pale yellow. SEEK MEDICAL CARE IF:  You have fever or chills.  You have nausea or vomiting.  You have a headache that lasts for more than 2 days. SEEK IMMEDIATE MEDICAL CARE IF:  You have any numbness or tingling in your legs.  You are unable to control your bowel or bladder.  You have bleeding or swelling in your back at the insertion site.  You are dizzy or faint. Document Released: 01/08/2013 Document Reviewed: 01/08/2013

## 2013-12-26 NOTE — Telephone Encounter (Signed)
Message left on answering machine and cell phone regarding CT results.

## 2013-12-26 NOTE — Telephone Encounter (Signed)
-----   Message from [facility], [place] sent at 12/25/2013  2:59 PM EST ----- No evidence of disease in the abdomen and pelvis.  CT of chest is stable.  Reviewed with Dr. Barnet Glasgow

## 2013-12-27 ENCOUNTER — Other Ambulatory Visit: Payer: Self-pay | Admitting: Family Medicine

## 2013-12-29 LAB — ANA, BODY FLUID: Anti-Nuclear Ab, IgG: NOT DETECTED

## 2013-12-30 LAB — CSF CULTURE: GRAM STAIN: NONE SEEN

## 2013-12-30 LAB — CSF CULTURE W GRAM STAIN: Culture: NO GROWTH

## 2013-12-30 NOTE — Progress Notes (Signed)
[name], MD [place] [address]  Metastatic cancer - Plan: DG Bone Survey Met, CBC with Differential, Comprehensive metabolic panel, TSH, CBC with Differential, CBC with Differential, CANCELED: CBC with Differential, CANCELED: Comprehensive metabolic panel  CURRENT THERAPY: Salvage treatment with Nivolumab (immunotherapy) beginning on 07/31/2013  INTERVAL HISTORY: Jeffrey [name] 80 y.o. 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 From 07/31/2012 until 11/12/2012 patient received 6 cycles of carboplatin and Taxol because of enlargement of pulmonary nodules. He was placed on maintenance Tarceva on 12/10/2012 which was stopped after repeat CT scan on 05/01/2013 showed progression of disease. Patient not a candidate for SBRT. Therefore, he started Nivolumab on 07/31/2013.    Metastatic cancer   02/17/2011 Imaging PET scan- B/L hypermetabolic pulmonary nodules (3 in total) are most consistent with metastatic disease. No evidence of mediastinal nodal metastasis.  No evidence of metastasis in the abdomen or pelvis.   06/19/2012 Imaging CT scan of chest- Slight enlargement of pulmonary nodules/metastasis.    07/31/2012 - 11/12/2012 Chemotherapy Carboplatin/Paclitaxel x 6 cycles   11/26/2012 Imaging CT CAP- Similar size of a R apical pulm nodule.  Similar to improved left lower lobe nodularity, favored to be post infectious or inflammatory. Similar nodularity in the right middle lobe. No evidence of new or progressive disease. Similar borderline adeno    12/10/2012 - 05/07/2013 Chemotherapy Tarceva 150 mg daily   05/03/2013 Progression CT CAP- Interval increase in size of right upper lobe adjacent pulmonary nodules is highly concerning  for progression of lung cancer recurrence.   07/31/2013 -  Chemotherapy Nivolumab   10/21/2013 Imaging CT CAP- The dominant right apical mass is slightly increased in size from previous exam. The adjacent nodule is stable.   10/31/2013 Imaging CT of head- Intermediate density subdural collection/hematoma along the convexity on the right, maximal thickness 7 mm. 2 mm of right-to-left shift. No hyper acute hemorrhage.  Lytic lesion right posterior frontal calvarium, new since the previous study.   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 Widespread underlying dural thickening and enhancement in the R hemisphere suspicious for meningeal involvement.   12/24/2013 Imaging CT CAP- No clear enlargement of right apical mass. Stable right middle lobe nodule. Mild mediastinal lymphadenopathy.   12/26/2013 Procedure Spinal CSF testing   I personally reviewed and went over laboratory results with the patient.  The results are noted within this dictation.  I personally reviewed and went over radiographic studies with the patient.  The results are noted within this dictation.  CT chest abdomen and pelvis does not demonstrate any progression of disease. Stability of disease is noted.  I personally reviewed and went over pathology results with the patient. Cytology from external spinal fluid is negative for any malignant cells.   With a negative workup for CNS involvement of malignancy, we will continue with therapy as planned. He continues to tolerate therapy well. Stability of disease is noted on most recent restaging scans of chest abdomen and pelvis.  He complains of frequent urination without any pain or burning. On the CT of abdomen and pelvis, prostatic hypertrophy was noted and this is likely the cause of his frequent urination. Provided education regarding BPH.  At this time, I will not treated, but if it  continues to be an issue, Flomax could be utilized to help with this issue.  Oncologically, the patient denies any complaints and are less questioning is negative.  Past Medical History  Diagnosis Date  . Tracheostomy in place   . Cancer 2010    laryngeal  . Lung cancer 2010  . Pneumonia 2014  . Dysrhythmia     takes Coreg and Lisinopril daily  . Shortness of breath     with exertion  . Enlarged prostate     takes flomax  . Urinary urgency   . Gastric ulcer     hx of  . Hx of transfusion of packed red blood cells     no abnormal reaction noted by patient  . Umbilical hernia   . Metastatic cancer 07/24/2012  . Hyperlipidemia   . ED (erectile dysfunction)   . Insomnia   . CHF (congestive heart failure)     has TOBACCO ABUSE; EAR PAIN, LEFT; C O P D; NECK PAIN, LEFT; POSTNASAL DRIP SYNDROME; UNSPECIFIED RESPIRATORY ABNORMALITY; History of laryngeal cancer; History of lung cancer; Tracheostomy in place; Elevated brain natriuretic peptide (BNP) level; Pulmonary nodules; Bilateral hydronephrosis; Cardiomyopathy; Metastatic cancer; Prostate hypertrophy; and Insomnia on his problem list.     has No Known Allergies.  Jeffrey [name] does not currently have medications on file.  Past Surgical History  Procedure Laterality Date  . Tracheostomy  2010  . Lung removal, partial  2010    left side  . Layngectomy for laryngeal cancer in2010; left upper lobectomy for lung cancer, 2010.    . Back surgery      thinks it was done in 2012  . Esophagogastroduodenoscopy    . Colonoscopy    . Portacath placement Bilateral 07/12/2012    Procedure: INSERTION PORT-A-CATH;  Surgeon: [name], MD;  Location: [facility] OR;  Service: Thoracic;  Laterality: Bilateral;    Denies any headaches, dizziness, double vision, fevers, chills, night sweats, nausea, vomiting, diarrhea, constipation, chest pain, heart palpitations, shortness of breath, blood in stool, black tarry stool, urinary pain, urinary burning,  hematuria.   PHYSICAL EXAMINATION  ECOG PERFORMANCE STATUS: 0 - Asymptomatic  Filed Vitals:   01/02/14 1115  BP: 119/66  Pulse: 78  Temp: 98.3 F (36.8 C)  Resp: 16    GENERAL:alert, no distress, well nourished, well developed, comfortable, cooperative and smiling, accompanied by his wife and mother-in-law. SKIN: skin color, texture, turgor are normal, no rashes or significant lesions HEAD: Normocephalic, No masses, lesions, tenderness or abnormalities EYES: normal, PERRLA, EOMI, Conjunctiva are pink and non-injected EARS: External ears normal OROPHARYNX:mucous membranes are moist  NECK: supple, no adenopathy, trachea midline LYMPH:  not examined BREAST:not examined LUNGS: Not examined HEART: not examined ABDOMEN:not examined BACK: Back symmetric, no curvature. EXTREMITIES:less then 2 second capillary refill, no skin discoloration, no cyanosis  NEURO: alert & oriented x 3 with fluent speech, no focal motor/sensory deficits, gait normal   LABORATORY DATA: CBC    Component Value Date/Time   WBC 5.6 01/02/2014 1232   RBC 3.45* 01/02/2014 1232   HGB 10.4* 01/02/2014 1232   HCT 31.8* 01/02/2014 1232   PLT 215 01/02/2014 1232   MCV 92.2 01/02/2014 1232   MCH 30.1 01/02/2014 1232   MCHC 32.7 01/02/2014 1232   RDW 13.8 01/02/2014 1232   LYMPHSABS 0.6* 01/02/2014 1232   MONOABS 0.5 01/02/2014 1232   EOSABS 0.7 01/02/2014 1232   BASOSABS 0.1 01/02/2014  [place]      Component Value Date/Time   NA 138 12/18/2013 0957   K 4.7 12/18/2013 0957   CL 103 12/18/2013 0957   CO2 23 12/18/2013 0957   BUN 25* 12/18/2013 0957   CREATININE 2.30* 12/24/2013 1255   CREATININE 1.46* 11/12/2012 0839      Component Value Date/Time   CALCIUM 8.8 12/18/2013 0957   CALCIUM 8.5 01/26/2012 1312   ALKPHOS 83 12/18/2013 0957   AST 11 12/18/2013 0957   ALT 7 12/18/2013 0957   BILITOT 0.2* 12/18/2013 0957        RADIOGRAPHIC STUDIES:  12/24/2013  CLINICAL DATA:  Restaging metastatic squamous cell carcinoma.  EXAM: CT CHEST, ABDOMEN AND PELVIS WITHOUT CONTRAST  TECHNIQUE: Multidetector CT imaging of the chest, abdomen and pelvis was performed following the standard protocol without IV contrast.  COMPARISON: CT 07/23/2013  FINDINGS: CT CHEST FINDINGS  Right apical mass measures 33 by 24 mm compared to 30 x 22 mm on 07/23/2013. Lesion measures 17 mm craniocaudad dimension on coronal image 53 compared to 18 mm on prior. Visually the lesion appears unchanged in size on the frontal projection. Extensive centrilobular emphysema in the upper lobes.  Right middle lobe lesion measures 11 mm on image 35 compared to 15 mm on prior  No axillary or supraclavicular lymphadenopathy. Paratracheal lymph nodes are difficult to define on this noncontrast exam but not appear changed from prior.  CT ABDOMEN AND PELVIS FINDINGS  No focal hepatic lesions non contrast exam. Stomach, small bowel cecum are normal. There is a large volume stool in the ascending colon and cecum. Left colon and rectosigmoid colon are normal. There is stool in the rectum.  Bilateral hydronephrosis and hydroureter which is similar to comparison exam. The bladder is distended and trabeculated which is increased comparison exam. The bladder extends to the level of the umbilicus. Prostate gland is enlarged.  Abdominal aorta is calcified but non aneurysmal. No retroperitoneal periportal lymphadenopathy  No aggressive osseous lesion.  IMPRESSION: Chest Impression:  1. No clear enlargement of right apical mass. 2. Stable right middle lobe nodule. 3. Centrilobular emphysema. 4. Mild mediastinal lymphadenopathy.  Abdomen / Pelvis Impression:  1. No evidence metastasis. 2. Bladder outlet obstruction with distended trabeculated bladder and bilateral hydroureter and hydronephrosis.   Electronically Signed  By: Suzy Bouchard M.D.  On: 12/24/2013  16:48   PATHOLOGY:  12/26/2013  Adequacy Reason Satisfactory For Evaluation. Diagnosis CEREBROSPINAL FLUID(SPECIMEN 1 OF 1 COLLECTED 12/26/13): NO MALIGNANT CELLS IDENTIFIED. Claudette Laws MD Pathologist, Electronic Signature (Case signed 12/30/2013)    ASSESSMENT:  1. 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 From 07/31/2012 until 11/12/2012 patient received 6 cycles of carboplatin and Taxol because of enlargement of pulmonary nodules. He was placed on maintenance Tarceva on 12/10/2012 which was stopped after repeat CT scan on 05/01/2013 showed progression of disease. Patient not a candidate for SBRT. Therefore, he started Nivolumab on 07/31/2013, tolerating well.  2. Cardiomyopathy, stable, without symptoms or signs of heart failure or dysrhythmia.  3. 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 Chronic obstructive pulmonary disease, no longer smoking.  5. 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 6. 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 Widespread dural thickening and enhancement in right hemisphere concerning for meningeal spread. Clinically stable without mental status changes and headaches. Negative cytology on CSF examination.  Patient Active Problem List   Diagnosis Date Noted  . Prostate hypertrophy 11/21/2012  . Insomnia 11/21/2012  . Metastatic cancer 07/24/2012  . Cardiomyopathy 01/27/2012  . History of laryngeal cancer 01/26/2012  . History of lung cancer 01/26/2012  . Tracheostomy in place 01/26/2012  . Elevated brain natriuretic peptide (BNP) level 01/26/2012    . Pulmonary nodules 01/26/2012  . Bilateral hydronephrosis 01/26/2012  . TOBACCO ABUSE 02/28/2008  . EAR PAIN, LEFT 02/28/2008  . C O P D 02/28/2008  . NECK PAIN, LEFT 02/28/2008  . POSTNASAL DRIP SYNDROME 02/28/2008  . UNSPECIFIED RESPIRATORY ABNORMALITY 02/28/2008     PLAN:  1. I personally reviewed and went over laboratory results with the patient.  The results are noted within this dictation. 2. I personally reviewed and went over radiographic studies with the patient.  The results are noted within this dictation.   3. I personally reviewed and went over pathology results with the patient. 4. Dg skeletal survey within the next 2 weeks. Lytic lesions may have pre-dated treatment. 5. Continue treatment as planned.  6. Pre-chemo labs as planned.  7. Return in 4 weeks for follow-up   THERAPY PLAN:  His work-up for CNS malignancy is negative.  His restaging tests were stable.  We will continue with therapy as planned.   All questions were answered. The patient knows to call the clinic with any problems, questions or concerns. We can certainly see the patient much sooner if necessary.  Patient and plan discussed with Dr. Farrel Gobble and he is in agreement with the aforementioned.   KEFALAS,THOMAS 01/02/2014

## 2014-01-01 ENCOUNTER — Inpatient Hospital Stay (HOSPITAL_COMMUNITY): Payer: Managed Care, Other (non HMO)

## 2014-01-02 ENCOUNTER — Ambulatory Visit (HOSPITAL_COMMUNITY): Payer: Managed Care, Other (non HMO) | Admitting: Oncology

## 2014-01-02 ENCOUNTER — Encounter (HOSPITAL_COMMUNITY): Payer: Self-pay | Admitting: Oncology

## 2014-01-02 ENCOUNTER — Encounter (HOSPITAL_BASED_OUTPATIENT_CLINIC_OR_DEPARTMENT_OTHER): Payer: Managed Care, Other (non HMO) | Admitting: Oncology

## 2014-01-02 ENCOUNTER — Inpatient Hospital Stay (HOSPITAL_COMMUNITY): Payer: Managed Care, Other (non HMO)

## 2014-01-02 ENCOUNTER — Encounter (HOSPITAL_COMMUNITY): Payer: Managed Care, Other (non HMO)

## 2014-01-02 ENCOUNTER — Encounter (HOSPITAL_BASED_OUTPATIENT_CLINIC_OR_DEPARTMENT_OTHER): Payer: Managed Care, Other (non HMO)

## 2014-01-02 VITALS — BP 119/66 | HR 78 | Temp 98.3°F | Resp 16 | Wt 136.9 lb

## 2014-01-02 DIAGNOSIS — Z85118 Personal history of other malignant neoplasm of bronchus and lung: Secondary | ICD-10-CM | POA: Diagnosis not present

## 2014-01-02 DIAGNOSIS — C7800 Secondary malignant neoplasm of unspecified lung: Secondary | ICD-10-CM

## 2014-01-02 DIAGNOSIS — C799 Secondary malignant neoplasm of unspecified site: Secondary | ICD-10-CM

## 2014-01-02 DIAGNOSIS — C3412 Malignant neoplasm of upper lobe, left bronchus or lung: Secondary | ICD-10-CM

## 2014-01-02 DIAGNOSIS — C7989 Secondary malignant neoplasm of other specified sites: Secondary | ICD-10-CM

## 2014-01-02 DIAGNOSIS — G620 Drug-induced polyneuropathy: Secondary | ICD-10-CM

## 2014-01-02 DIAGNOSIS — Z5112 Encounter for antineoplastic immunotherapy: Secondary | ICD-10-CM

## 2014-01-02 LAB — CBC WITH DIFFERENTIAL/PLATELET
Basophils Absolute: 0.1 10*3/uL (ref 0.0–0.1)
Basophils Relative: 1 % (ref 0–1)
Eosinophils Absolute: 0.7 10*3/uL (ref 0.0–0.7)
Eosinophils Relative: 13 % — ABNORMAL HIGH (ref 0–5)
HEMATOCRIT: 31.8 % — AB (ref 39.0–52.0)
HEMOGLOBIN: 10.4 g/dL — AB (ref 13.0–17.0)
LYMPHS ABS: 0.6 10*3/uL — AB (ref 0.7–4.0)
Lymphocytes Relative: 11 % — ABNORMAL LOW (ref 12–46)
MCH: 30.1 pg (ref 26.0–34.0)
MCHC: 32.7 g/dL (ref 30.0–36.0)
MCV: 92.2 fL (ref 78.0–100.0)
MONOS PCT: 9 % (ref 3–12)
Monocytes Absolute: 0.5 10*3/uL (ref 0.1–1.0)
NEUTROS ABS: 3.8 10*3/uL (ref 1.7–7.7)
NEUTROS PCT: 66 % (ref 43–77)
Platelets: 215 10*3/uL (ref 150–400)
RBC: 3.45 MIL/uL — ABNORMAL LOW (ref 4.22–5.81)
RDW: 13.8 % (ref 11.5–15.5)
WBC: 5.6 10*3/uL (ref 4.0–10.5)

## 2014-01-02 MED ORDER — HEPARIN SOD (PORK) LOCK FLUSH 100 UNIT/ML IV SOLN
500.0000 [IU] | Freq: Once | INTRAVENOUS | Status: AC | PRN
  Administered 2014-01-02: 500 [IU]
  Filled 2014-01-02: qty 5

## 2014-01-02 MED ORDER — SODIUM CHLORIDE 0.9 % IJ SOLN
10.0000 mL | INTRAMUSCULAR | Status: DC | PRN
  Administered 2014-01-02: 10 mL
  Filled 2014-01-02: qty 10

## 2014-01-02 MED ORDER — SODIUM CHLORIDE 0.9 % IV SOLN
3.0000 mg/kg | Freq: Once | INTRAVENOUS | Status: AC
  Administered 2014-01-02: 190 mg via INTRAVENOUS
  Filled 2014-01-02: qty 19

## 2014-01-02 MED ORDER — SODIUM CHLORIDE 0.9 % IV SOLN
Freq: Once | INTRAVENOUS | Status: AC
Start: 2014-01-02 — End: 2014-01-02
  Administered 2014-01-02: 12:00:00 via INTRAVENOUS

## 2014-01-02 NOTE — Patient Instructions (Signed)
[facility] Discharge Instructions for Patients Receiving Chemotherapy  Today you received the following chemotherapy agents Opdivo.  To help prevent nausea and vomiting after your treatment, we encourage you to take your nausea medication as instructed.   If you develop nausea and vomiting that is not controlled by your nausea medication, call the clinic. If it is after clinic hours your family physician or the after hours number for the clinic or go to the Emergency Department.   BELOW ARE SYMPTOMS THAT SHOULD BE REPORTED IMMEDIATELY:  *FEVER GREATER THAN 101.0 F  *CHILLS WITH OR WITHOUT FEVER  NAUSEA AND VOMITING THAT IS NOT CONTROLLED WITH YOUR NAUSEA MEDICATION  *UNUSUAL SHORTNESS OF BREATH  *UNUSUAL BRUISING OR BLEEDING  TENDERNESS IN MOUTH AND THROAT WITH OR WITHOUT PRESENCE OF ULCERS  *URINARY PROBLEMS  *BOWEL PROBLEMS  UNUSUAL RASH Items with * indicate a potential emergency and should be followed up as soon as possible.  Return as scheduled.   I have been informed and understand all the instructions given to me. I know to contact the clinic, my physician, or go to the Emergency Department if any problems should occur. I do not have any questions at this time, but understand that I may call the clinic during office hours or the Patient Navigator at [phone] should I have any questions or need assistance in obtaining follow up care.    __________________________________________  _____________  __________ Signature of Patient or Authorized Representative            Date                   Time    __________________________________________ Nurse's Signature

## 2014-01-02 NOTE — Patient Instructions (Signed)
[facility] Discharge Instructions  RECOMMENDATIONS MADE BY THE CONSULTANT AND ANY TEST RESULTS WILL BE SENT TO YOUR REFERRING PHYSICIAN.  Continue chemotherapy as planned.  Your CT scans are stable.  Your spinal fluid did not have any cancer cells identified.  We wills et you up for xrays of bone before the New Year. Return in 4 weeks for follow-up  Merry Christmas.   Thank you for choosing [facility] to provide your oncology and hematology care.  To afford each patient quality time with our providers, please arrive at least 15 minutes before your scheduled appointment time.  With your help, our goal is to use those 15 minutes to complete the necessary work-up to ensure our physicians have the information they need to help with your evaluation and healthcare recommendations.    Effective January 1st, 2014, we ask that you re-schedule your appointment with our physicians should you arrive 10 or more minutes late for your appointment.  We strive to give you quality time with our providers, and arriving late affects you and other patients whose appointments are after yours.    Again, thank you for choosing [facility].  Our hope is that these requests will decrease the amount of time that you wait before being seen by our physicians.       _____________________________________________________________  Should you have questions after your visit to [facility], please contact our office at [phone] between the hours of 8:30 a.m. and 5:00 p.m.  Voicemails left after 4:30 p.m. will not be returned until the following business day.  For prescription refill requests, have your pharmacy contact our office with your prescription refill request.

## 2014-01-02 NOTE — Progress Notes (Signed)
Tolerated well

## 2014-01-14 ENCOUNTER — Ambulatory Visit (HOSPITAL_COMMUNITY)
Admission: RE | Admit: 2014-01-14 | Discharge: 2014-01-14 | Disposition: A | Discharge status: Home / Self Care | Payer: Managed Care, Other (non HMO) | Source: Ambulatory Visit | Attending: Oncology | Admitting: Oncology

## 2014-01-14 DIAGNOSIS — C801 Malignant (primary) neoplasm, unspecified: Secondary | ICD-10-CM | POA: Insufficient documentation

## 2014-01-14 DIAGNOSIS — C799 Secondary malignant neoplasm of unspecified site: Secondary | ICD-10-CM

## 2014-01-15 ENCOUNTER — Encounter: Payer: Self-pay | Admitting: Family Medicine

## 2014-01-15 ENCOUNTER — Ambulatory Visit (INDEPENDENT_AMBULATORY_CARE_PROVIDER_SITE_OTHER): Payer: Managed Care, Other (non HMO) | Admitting: Family Medicine

## 2014-01-15 VITALS — BP 116/72 | Ht 67.0 in | Wt 138.4 lb

## 2014-01-15 DIAGNOSIS — Z23 Encounter for immunization: Secondary | ICD-10-CM

## 2014-01-15 DIAGNOSIS — M25512 Pain in left shoulder: Secondary | ICD-10-CM

## 2014-01-15 MED ORDER — ETODOLAC 400 MG PO TABS: 400.0000 mg | ORAL_TABLET | Freq: Two times a day (BID) | ORAL | Status: DC

## 2014-01-15 MED ORDER — CHLORZOXAZONE 500 MG PO TABS: 500.0000 mg | ORAL_TABLET | Freq: Three times a day (TID) | ORAL | Status: DC | PRN

## 2014-01-15 NOTE — Progress Notes (Signed)
   Subjective:    Patient ID: KRISTOFFER BALA, male    DOB: 1933-09-13, 80 y.o.   MRN: [id number]  HPI  Patient arrives for complaint of right shoulder pain for one month. Patient lifting heavy object and he noticed left shoulder discomfort. Primarily suprascapular. Now worse with certain motions.  History of lung cancer. Followed by specialist for this. Multiple reports reviewed since last visit.  Compliant with blood pressure medication. No obvious side effects. Has cut down her salt intake. Walking some but not a lot.  Shoulder pain unresponsive to over-the-counter medications.  Review of Systems No chest pain no headache no shortness of breath no cough beyond normal.     Objective:   Physical Exam  Alert no apparent distress. HEENT normal. Blood pressure good on repeat neck supple. Lungs diminished breath sounds no wheezes heart rare rhythm left shoulder some element of frozen shoulder. Positive suprascapular and supraclavicular tenderness. No deltoid tenderness      Assessment & Plan:  pressure 1 hypertension good control #2 shoulder strain plan maintain same blood pressure medication. Anti-inflammatory muscle spasm and hydrocodone added when necessary for pain. WSL range of motion exercises encouraged

## 2014-01-16 ENCOUNTER — Encounter (HOSPITAL_BASED_OUTPATIENT_CLINIC_OR_DEPARTMENT_OTHER): Payer: Managed Care, Other (non HMO)

## 2014-01-16 DIAGNOSIS — C7800 Secondary malignant neoplasm of unspecified lung: Secondary | ICD-10-CM

## 2014-01-16 DIAGNOSIS — Z85118 Personal history of other malignant neoplasm of bronchus and lung: Secondary | ICD-10-CM | POA: Diagnosis not present

## 2014-01-16 DIAGNOSIS — Z5112 Encounter for antineoplastic immunotherapy: Secondary | ICD-10-CM

## 2014-01-16 DIAGNOSIS — C3412 Malignant neoplasm of upper lobe, left bronchus or lung: Secondary | ICD-10-CM

## 2014-01-16 DIAGNOSIS — C799 Secondary malignant neoplasm of unspecified site: Secondary | ICD-10-CM

## 2014-01-16 LAB — CBC WITH DIFFERENTIAL/PLATELET
BASOS ABS: 0.1 10*3/uL (ref 0.0–0.1)
Basophils Relative: 1 % (ref 0–1)
EOS PCT: 9 % — AB (ref 0–5)
Eosinophils Absolute: 0.5 10*3/uL (ref 0.0–0.7)
HCT: 31.6 % — ABNORMAL LOW (ref 39.0–52.0)
Hemoglobin: 10.5 g/dL — ABNORMAL LOW (ref 13.0–17.0)
Lymphocytes Relative: 9 % — ABNORMAL LOW (ref 12–46)
Lymphs Abs: 0.5 10*3/uL — ABNORMAL LOW (ref 0.7–4.0)
MCH: 30.7 pg (ref 26.0–34.0)
MCHC: 33.2 g/dL (ref 30.0–36.0)
MCV: 92.4 fL (ref 78.0–100.0)
MONO ABS: 0.4 10*3/uL (ref 0.1–1.0)
Monocytes Relative: 8 % (ref 3–12)
NEUTROS ABS: 4 10*3/uL (ref 1.7–7.7)
NEUTROS PCT: 73 % (ref 43–77)
Platelets: 224 10*3/uL (ref 150–400)
RBC: 3.42 MIL/uL — AB (ref 4.22–5.81)
RDW: 14.1 % (ref 11.5–15.5)
WBC: 5.5 10*3/uL (ref 4.0–10.5)

## 2014-01-16 LAB — COMPREHENSIVE METABOLIC PANEL
ALT: 8 U/L (ref 0–53)
AST: 11 U/L (ref 0–37)
Albumin: 3.5 g/dL (ref 3.5–5.2)
Alkaline Phosphatase: 64 U/L (ref 39–117)
Anion gap: 7 (ref 5–15)
BUN: 29 mg/dL — ABNORMAL HIGH (ref 6–23)
CALCIUM: 8.4 mg/dL (ref 8.4–10.5)
CO2: 23 mmol/L (ref 19–32)
Chloride: 106 mEq/L (ref 96–112)
Creatinine, Ser: 1.81 mg/dL — ABNORMAL HIGH (ref 0.50–1.35)
GFR calc Af Amer: 40 mL/min — ABNORMAL LOW (ref >90)
GFR calc non Af Amer: 34 mL/min — ABNORMAL LOW (ref >90)
Glucose, Bld: 105 mg/dL — ABNORMAL HIGH (ref 70–99)
Potassium: 4.9 mmol/L (ref 3.5–5.1)
Sodium: 136 mmol/L (ref 135–145)
TOTAL PROTEIN: 7 g/dL (ref 6.0–8.3)
Total Bilirubin: 0.4 mg/dL (ref 0.3–1.2)

## 2014-01-16 LAB — TSH: TSH: 2.234 u[IU]/mL (ref 0.350–4.500)

## 2014-01-16 MED ORDER — SODIUM CHLORIDE 0.9 % IV SOLN
Freq: Once | INTRAVENOUS | Status: AC
  Administered 2014-01-16: 10:00:00 via INTRAVENOUS

## 2014-01-16 MED ORDER — HEPARIN SOD (PORK) LOCK FLUSH 100 UNIT/ML IV SOLN
500.0000 [IU] | Freq: Once | INTRAVENOUS | Status: AC | PRN
  Administered 2014-01-16: 500 [IU]
  Filled 2014-01-16: qty 5

## 2014-01-16 MED ORDER — SODIUM CHLORIDE 0.9 % IJ SOLN: 10.0000 mL | INTRAMUSCULAR | Status: DC | PRN

## 2014-01-16 MED ORDER — SODIUM CHLORIDE 0.9 % IV SOLN
3.0000 mg/kg | Freq: Once | INTRAVENOUS | Status: AC
  Administered 2014-01-16: 190 mg via INTRAVENOUS
  Filled 2014-01-16: qty 19

## 2014-01-16 NOTE — Patient Instructions (Signed)
[facility] Discharge Instructions  RECOMMENDATIONS MADE BY THE CONSULTANT AND ANY TEST RESULTS WILL BE SENT TO YOUR REFERRING PHYSICIAN.  EXAM FINDINGS BY THE PHYSICIAN TODAY AND SIGNS OR SYMPTOMS TO REPORT TO CLINIC OR PRIMARY PHYSICIAN:    Today you had Opdivo.   We also did a TSH level. If results aren't back today - we will call you Monday with lab results.   Return on January 14 @ 12:45 for Opdivo treatment and to see Jeffrey [name]. (Dr. Whitney Muse will probably see you with Jeffrey [name] that day)!    Thank you for choosing [facility] to provide your oncology and hematology care.  To afford each patient quality time with our providers, please arrive at least 15 minutes before your scheduled appointment time.  With your help, our goal is to use those 15 minutes to complete the necessary work-up to ensure our physicians have the information they need to help with your evaluation and healthcare recommendations.    Effective January 1st, 2014, we ask that you re-schedule your appointment with our physicians should you arrive 10 or more minutes late for your appointment.  We strive to give you quality time with our providers, and arriving late affects you and other patients whose appointments are after yours.    Again, thank you for choosing [facility].  Our hope is that these requests will decrease the amount of time that you wait before being seen by our physicians.       _____________________________________________________________  Should you have questions after your visit to [facility], please contact our office at [phone] between the hours of 8:30 a.m. and 4:30 p.m.  Voicemails left after 4:30 p.m. will not be returned until the following business day.  For prescription refill requests, have your pharmacy contact our office with your prescription refill request.    _______________________________________________________________  We  hope that we have given you very good care.  You may receive a patient satisfaction survey in the mail, please complete it and return it as soon as possible.  We value your feedback!  _______________________________________________________________  Have you asked about our STAR program?  STAR stands for Survivorship Training and Rehabilitation, and this is a nationally recognized cancer care program that focuses on survivorship and rehabilitation.  Cancer and cancer treatments may cause problems, such as, pain, making you feel tired and keeping you from doing the things that you need or want to do. Cancer rehabilitation can help. Our goal is to reduce these troubling effects and help you have the best quality of life possible.  You may receive a survey from a nurse that asks questions about your current state of health.  Based on the survey results, all eligible patients will be referred to the [facility] program for an evaluation so we can better serve you!  A frequently asked questions sheet is available upon request.

## 2014-01-16 NOTE — Progress Notes (Signed)
[name] Tolerated chemotherapy well, discharged ambulatory

## 2014-01-27 ENCOUNTER — Encounter (HOSPITAL_COMMUNITY): Payer: Self-pay | Admitting: Oncology

## 2014-01-27 DIAGNOSIS — C7951 Secondary malignant neoplasm of bone: Secondary | ICD-10-CM

## 2014-01-27 DIAGNOSIS — C801 Malignant (primary) neoplasm, unspecified: Secondary | ICD-10-CM

## 2014-01-27 HISTORY — DX: Secondary malignant neoplasm of bone: C79.51

## 2014-01-27 NOTE — Assessment & Plan Note (Signed)
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 From 07/31/2012 until 11/12/2012 patient received 6 cycles of carboplatin and Taxol because of enlargement of pulmonary nodules. He was placed on maintenance Tarceva on 12/10/2012 which was stopped after repeat CT scan on 05/01/2013 showed progression of disease. Patient not a candidate for SBRT. Therefore, he started Nivolumab on 07/31/2013, tolerating well. Continue with pre-chemo labs.  Return in 2-4 weeks for follow-up.

## 2014-01-27 NOTE — Assessment & Plan Note (Addendum)
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 Widespread dural thickening and enhancement in right hemisphere concerning for meningeal spread. Clinically stable without mental status changes and headaches. Negative cytology on CSF examination.  Future imaging is indicated. Pre-chemo labs today as planned.  Continue with treatment every 2 weeks. Will start Xgeva every month for bone metastasis.  Return in 4 weeks for follow-up

## 2014-01-27 NOTE — Progress Notes (Signed)
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 From 07/31/2012 until 11/12/2012 patient received 6 cycles of carboplatin and Taxol because of enlargement of pulmonary nodules. He was placed on maintenance Tarceva on 12/10/2012 which was stopped after repeat CT scan on 05/01/2013 showed progression of disease. Patient not a candidate for SBRT. Therefore, he started Nivolumab on 07/31/2013.    Metastatic cancer   02/17/2011 Imaging PET scan- B/L hypermetabolic pulmonary nodules (3 in total) are most consistent with metastatic disease. No evidence of mediastinal nodal metastasis.  No evidence of metastasis in the abdomen or pelvis.   06/19/2012 Imaging CT scan of chest- Slight enlargement of pulmonary nodules/metastasis.    07/31/2012 - 11/12/2012 Chemotherapy Carboplatin/Paclitaxel x 6 cycles   11/26/2012 Imaging CT CAP- Similar size of a R apical pulm nodule.  Similar to improved left lower lobe nodularity, favored to be post infectious or inflammatory. Similar nodularity in the right middle lobe. No evidence of new or progressive disease. Similar borderline adeno    12/10/2012 - 05/07/2013 Chemotherapy Tarceva 150 mg daily   05/03/2013 Progression CT CAP- Interval increase in size of right upper lobe adjacent pulmonary nodules is highly concerning for progression of lung cancer recurrence.   07/31/2013 -  Chemotherapy Nivolumab   10/21/2013  Imaging CT CAP- The dominant right apical mass is slightly increased in size from previous exam. The adjacent nodule is stable.   10/31/2013 Imaging CT of head- Intermediate density subdural collection/hematoma along the convexity on the right, maximal thickness 7 mm. 2 mm of right-to-left shift. No hyper acute hemorrhage.  Lytic lesion right posterior frontal calvarium, new since the previous study.   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 Widespread underlying dural thickening and enhancement in the R hemisphere suspicious for meningeal involvement.   12/24/2013 Imaging CT CAP- No clear enlargement of right apical mass. Stable right middle lobe nodule. Mild mediastinal lymphadenopathy.   12/26/2013 Procedure Spinal CSF testing- negative for malignancy.   01/14/2014 Imaging Skeletal survey- Lytic lesion noted in the right frontoparietal region as noted on recent head CT of 10/31/2013. This is consist with metastatic disease. No other focal bony lesions noted.    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  The results are noted within this dictation.  I personally reviewed and went over radiographic studies with the patient.  The results are noted within this dictation.    With his lytic lesion, he is a candidate for Xgeva.  We discussed the role of this medication.  We discussed the risks, benefits, alternatives, and side effects of therapy including hypocalcemia and ONJ.  He is advised to take Oscal D BID and avoid any dental procedures, except tooth cleanings and emergency situations, until we are consulted.  Consent is  signed.   He complains of left shoulder pain after maneuvering a door while working in his house.  He has seen Dr. [name] about this and he is  addressing this issue appropriately.   Oncologically, he denies any complaints and ROS questioning is negative.  Past Medical History  Diagnosis Date  . Tracheostomy in place   . Cancer 2010    laryngeal  . Lung cancer 2010  . Pneumonia 2014  . Dysrhythmia     takes Coreg and Lisinopril daily  . Shortness of breath     with exertion  . Enlarged prostate     takes flomax  . Urinary urgency   . Gastric ulcer     hx of  . Hx of transfusion of packed red blood cells     no abnormal reaction noted by patient  . Umbilical hernia   . Metastatic cancer 07/24/2012  . Hyperlipidemia   . ED (erectile dysfunction)   . Insomnia   . CHF (congestive heart failure)   . 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     has No Known Allergies.  [name] does not currently have medications on file.  Past Surgical History  Procedure Laterality Date  . Tracheostomy  2010  . Lung removal, partial  2010    left side  . Layngectomy for laryngeal cancer in2010; left upper lobectomy for lung cancer, 2010.    . Back surgery      thinks it was done in 2012  . Esophagogastroduodenoscopy    . Colonoscopy    . Portacath placement Bilateral 07/12/2012    Procedure: INSERTION PORT-A-CATH;  Surgeon: [name], MD;  Location: [facility] OR;  Service: Thoracic;  Laterality: Bilateral;    Denies any headaches, dizziness, double vision, fevers, chills, night sweats, nausea, vomiting, diarrhea, constipation, chest pain, heart palpitations, shortness of breath, blood  in stool, black tarry stool, urinary pain, urinary burning, urinary frequency, hematuria.   PHYSICAL EXAMINATION  ECOG PERFORMANCE STATUS: 0 - Asymptomatic  There were no vitals filed for this visit.  GENERAL:alert, no distress, well nourished, well developed, comfortable, cooperative and smiling SKIN: skin color, texture, turgor are normal, no rashes or significant lesions HEAD: Normocephalic, No masses, lesions, tenderness or abnormalities EYES: normal, PERRLA, EOMI, Conjunctiva are pink and non-injected EARS: External ears normal OROPHARYNX:lips, buccal mucosa, and tongue normal and mucous membranes are moist  NECK: supple, trachea midline LYMPH:  not examined BREAST:not examined LUNGS: not examined HEART: not examined ABDOMEN:abdomen soft BACK: Back symmetric, no curvature. EXTREMITIES:less then 2 second capillary refill, no skin discoloration, no cyanosis  NEURO: alert & oriented x 3 with fluent speech, no focal motor/sensory deficits   LABORATORY DATA: CBC    Component Value Date/Time   WBC 5.8 01/30/2014 1315   RBC 3.35* 01/30/2014 1315   HGB 10.2* 01/30/2014 1315   HCT 30.5* 01/30/2014 1315   PLT 208 01/30/2014 1315   MCV 91.0 01/30/2014 1315   MCH 30.4 01/30/2014 1315   MCHC 33.4 01/30/2014 1315   RDW 14.2 01/30/2014 1315   LYMPHSABS 0.6* 01/30/2014 1315   MONOABS 0.4 01/30/2014 1315  EOSABS 0.6 01/30/2014 1315   BASOSABS 0.1 01/30/2014 1315      Chemistry      Component Value Date/Time   NA 137 01/30/2014 1315   K 4.9 01/30/2014 1315   CL 108 01/30/2014 1315   CO2 23 01/30/2014 1315   BUN 34* 01/30/2014 1315   CREATININE 2.02* 01/30/2014 1315   CREATININE 1.46* 11/12/2012 0839      Component Value Date/Time   CALCIUM 8.9 01/30/2014 1315   CALCIUM 8.5 01/26/2012 1312   ALKPHOS 73 01/30/2014 1315   AST 11 01/30/2014 1315   ALT 9 01/30/2014 1315   BILITOT 0.4 01/30/2014 1315       RADIOGRAPHIC STUDIES:  Dg Bone Survey Met  01/14/2014    CLINICAL DATA:  Metastatic cancer.  EXAM: METASTATIC BONE SURVEY  COMPARISON:  PET-CT of 02/17/2012.  A CT 10/31/2013  FINDINGS: Imaging in the axial and appendicular skeleton performed. Large lytic lesion is noted in the skull previously identified on prior head CT of 10/31/2013 and consistent with metastatic disease or myeloma. No other focal bony lesions are noted.Power port catheter noted in good anatomic position. Diffuse severe degenerative changes are present.  IMPRESSION: Lytic lesion noted in the right frontoparietal region as noted on recent head CT of 10/31/2013. This is consist with metastatic disease. No other focal bony lesions noted.   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 Widespread dural thickening and enhancement in right hemisphere concerning for meningeal spread. Clinically stable without mental status changes and headaches. Negative cytology on CSF examination.  Future imaging is indicated. Pre-chemo labs today as planned.  Continue with treatment every 2 weeks. Will start Xgeva every month for bone metastasis.  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 From 07/31/2012 until 11/12/2012 patient received 6 cycles of carboplatin and Taxol because of enlargement of pulmonary nodules. He was placed on maintenance Tarceva on 12/10/2012 which was stopped after repeat CT scan on 05/01/2013 showed progression of disease. Patient not a candidate for SBRT. Therefore, he started Nivolumab on 07/31/2013, tolerating well.  Continue with pre-chemo labs.  Return in 2-4 weeks for follow-up.    THERAPY PLAN:  His work-up for CNS malignancy is negative. His restaging tests were stable. We will continue with therapy as planned.   All questions were answered. The patient knows to call the clinic with any problems, questions or concerns. We can certainly see the patient much sooner if necessary.  Patient and plan discussed with Dr. Ancil Linsey and she is in agreement with the aforementioned.   Dereon Williamsen 01/30/2014

## 2014-01-30 ENCOUNTER — Encounter (HOSPITAL_COMMUNITY): Payer: Managed Care, Other (non HMO)

## 2014-01-30 ENCOUNTER — Encounter (HOSPITAL_COMMUNITY): Payer: Managed Care, Other (non HMO) | Attending: Oncology | Admitting: Oncology

## 2014-01-30 ENCOUNTER — Encounter (HOSPITAL_BASED_OUTPATIENT_CLINIC_OR_DEPARTMENT_OTHER): Payer: Managed Care, Other (non HMO)

## 2014-01-30 DIAGNOSIS — R918 Other nonspecific abnormal finding of lung field: Secondary | ICD-10-CM | POA: Diagnosis present

## 2014-01-30 DIAGNOSIS — C7951 Secondary malignant neoplasm of bone: Secondary | ICD-10-CM

## 2014-01-30 DIAGNOSIS — Z85118 Personal history of other malignant neoplasm of bronchus and lung: Secondary | ICD-10-CM | POA: Insufficient documentation

## 2014-01-30 DIAGNOSIS — C801 Malignant (primary) neoplasm, unspecified: Secondary | ICD-10-CM

## 2014-01-30 DIAGNOSIS — C799 Secondary malignant neoplasm of unspecified site: Secondary | ICD-10-CM

## 2014-01-30 DIAGNOSIS — C7802 Secondary malignant neoplasm of left lung: Secondary | ICD-10-CM

## 2014-01-30 DIAGNOSIS — Z5112 Encounter for antineoplastic immunotherapy: Secondary | ICD-10-CM

## 2014-01-30 LAB — COMPREHENSIVE METABOLIC PANEL
ALK PHOS: 73 U/L (ref 39–117)
ALT: 9 U/L (ref 0–53)
ANION GAP: 6 (ref 5–15)
AST: 11 U/L (ref 0–37)
Albumin: 3.4 g/dL — ABNORMAL LOW (ref 3.5–5.2)
BUN: 34 mg/dL — ABNORMAL HIGH (ref 6–23)
CALCIUM: 8.9 mg/dL (ref 8.4–10.5)
CHLORIDE: 108 meq/L (ref 96–112)
CO2: 23 mmol/L (ref 19–32)
Creatinine, Ser: 2.02 mg/dL — ABNORMAL HIGH (ref 0.50–1.35)
GFR calc Af Amer: 35 mL/min — ABNORMAL LOW (ref >90)
GFR, EST NON AFRICAN AMERICAN: 30 mL/min — AB (ref >90)
Glucose, Bld: 107 mg/dL — ABNORMAL HIGH (ref 70–99)
Potassium: 4.9 mmol/L (ref 3.5–5.1)
Sodium: 137 mmol/L (ref 135–145)
Total Bilirubin: 0.4 mg/dL (ref 0.3–1.2)
Total Protein: 7.4 g/dL (ref 6.0–8.3)

## 2014-01-30 LAB — CBC WITH DIFFERENTIAL/PLATELET
BASOS PCT: 1 % (ref 0–1)
Basophils Absolute: 0.1 10*3/uL (ref 0.0–0.1)
EOS ABS: 0.6 10*3/uL (ref 0.0–0.7)
Eosinophils Relative: 10 % — ABNORMAL HIGH (ref 0–5)
HCT: 30.5 % — ABNORMAL LOW (ref 39.0–52.0)
Hemoglobin: 10.2 g/dL — ABNORMAL LOW (ref 13.0–17.0)
LYMPHS ABS: 0.6 10*3/uL — AB (ref 0.7–4.0)
Lymphocytes Relative: 10 % — ABNORMAL LOW (ref 12–46)
MCH: 30.4 pg (ref 26.0–34.0)
MCHC: 33.4 g/dL (ref 30.0–36.0)
MCV: 91 fL (ref 78.0–100.0)
MONOS PCT: 6 % (ref 3–12)
Monocytes Absolute: 0.4 10*3/uL (ref 0.1–1.0)
NEUTROS PCT: 73 % (ref 43–77)
Neutro Abs: 4.3 10*3/uL (ref 1.7–7.7)
Platelets: 208 10*3/uL (ref 150–400)
RBC: 3.35 MIL/uL — ABNORMAL LOW (ref 4.22–5.81)
RDW: 14.2 % (ref 11.5–15.5)
WBC: 5.8 10*3/uL (ref 4.0–10.5)

## 2014-01-30 MED ORDER — HEPARIN SOD (PORK) LOCK FLUSH 100 UNIT/ML IV SOLN
INTRAVENOUS | Status: AC
  Filled 2014-01-30: qty 5

## 2014-01-30 MED ORDER — HEPARIN SOD (PORK) LOCK FLUSH 100 UNIT/ML IV SOLN
500.0000 [IU] | Freq: Once | INTRAVENOUS | Status: AC | PRN
Start: 2014-01-30 — End: 2014-01-30
  Administered 2014-01-30: 500 [IU]

## 2014-01-30 MED ORDER — SODIUM CHLORIDE 0.9 % IV SOLN
3.0000 mg/kg | Freq: Once | INTRAVENOUS | Status: AC
  Administered 2014-01-30: 190 mg via INTRAVENOUS
  Filled 2014-01-30: qty 19

## 2014-01-30 MED ORDER — SODIUM CHLORIDE 0.9 % IV SOLN
Freq: Once | INTRAVENOUS | Status: AC
  Administered 2014-01-30: 13:00:00 via INTRAVENOUS

## 2014-01-30 MED ORDER — DENOSUMAB 120 MG/1.7ML ~~LOC~~ SOLN
120.0000 mg | Freq: Once | SUBCUTANEOUS | Status: AC
Start: 2014-01-30 — End: 2014-01-30
  Administered 2014-01-30: 120 mg via SUBCUTANEOUS
  Filled 2014-01-30: qty 1.7

## 2014-01-30 MED ORDER — CALCIUM CARBONATE-VITAMIN D 500-200 MG-UNIT PO TABS: 2.0000 | ORAL_TABLET | Freq: Every day | ORAL | Status: DC

## 2014-01-30 MED ORDER — SODIUM CHLORIDE 0.9 % IJ SOLN
10.0000 mL | INTRAMUSCULAR | Status: DC | PRN
  Administered 2014-01-30: 10 mL
  Filled 2014-01-30: qty 10

## 2014-01-30 NOTE — Patient Instructions (Addendum)
[facility] Discharge Instructions for Patients Receiving Chemotherapy  Today you received the following chemotherapy agents Opdivo. You also were given first injection Xgeva. This injection will be given monthly. To help prevent nausea and vomiting after your treatment, we encourage you to take your nausea medication as instructed. If you develop nausea and vomiting that is not controlled by your nausea medication, call the clinic. If it is after clinic hours your family physician or the after hours number for the clinic or go to the Emergency Department.   BELOW ARE SYMPTOMS THAT SHOULD BE REPORTED IMMEDIATELY:  *FEVER GREATER THAN 101.0 F  *CHILLS WITH OR WITHOUT FEVER  NAUSEA AND VOMITING THAT IS NOT CONTROLLED WITH YOUR NAUSEA MEDICATION  *UNUSUAL SHORTNESS OF BREATH  *UNUSUAL BRUISING OR BLEEDING  TENDERNESS IN MOUTH AND THROAT WITH OR WITHOUT PRESENCE OF ULCERS  *URINARY PROBLEMS  *BOWEL PROBLEMS  UNUSUAL RASH Items with * indicate a potential emergency and should be followed up as soon as possible. Denosumab injection What is this medicine? DENOSUMAB (den oh sue mab) slows bone breakdown. Prolia is used to treat osteoporosis in women after menopause and in men. Delton See is used to prevent bone fractures and other bone problems caused by cancer bone metastases. Delton See is also used to treat giant cell tumor of the bone. This medicine may be used for other purposes; ask your health care provider or pharmacist if you have questions. COMMON BRAND NAME(S): Prolia, XGEVA What should I tell my health care provider before I take this medicine? They need to know if you have any of these conditions: -dental disease -eczema -infection or history of infections -kidney disease or on dialysis -low blood calcium or vitamin D -malabsorption syndrome -scheduled to have surgery or tooth extraction -taking medicine that contains denosumab -thyroid or parathyroid disease -an  unusual reaction to denosumab, other medicines, foods, dyes, or preservatives -pregnant or trying to get pregnant -breast-feeding How should I use this medicine? This medicine is for injection under the skin. It is given by a health care professional in a hospital or clinic setting. If you are getting Prolia, a special MedGuide will be given to you by the pharmacist with each prescription and refill. Be sure to read this information carefully each time. For Prolia, talk to your pediatrician regarding the use of this medicine in children. Special care may be needed. For Delton See, talk to your pediatrician regarding the use of this medicine in children. While this drug may be prescribed for children as young as 13 years for selected conditions, precautions do apply. Overdosage: If you think you've taken too much of this medicine contact a poison control center or emergency room at once. Overdosage: If you think you have taken too much of this medicine contact a poison control center or emergency room at once. NOTE: This medicine is only for you. Do not share this medicine with others. What if I miss a dose? It is important not to miss your dose. Call your doctor or health care professional if you are unable to keep an appointment. What may interact with this medicine? Do not take this medicine with any of the following medications: -other medicines containing denosumab This medicine may also interact with the following medications: -medicines that suppress the immune system -medicines that treat cancer -steroid medicines like prednisone or cortisone This list may not describe all possible interactions. Give your health care provider a list of all the medicines, herbs, non-prescription drugs, or dietary supplements you use.  Also tell them if you smoke, drink alcohol, or use illegal drugs. Some items may interact with your medicine. What should I watch for while using this medicine? Visit your doctor  or health care professional for regular checks on your progress. Your doctor or health care professional may order blood tests and other tests to see how you are doing. Call your doctor or health care professional if you get a cold or other infection while receiving this medicine. Do not treat yourself. This medicine may decrease your body's ability to fight infection. You should make sure you get enough calcium and vitamin D while you are taking this medicine, unless your doctor tells you not to. Discuss the foods you eat and the vitamins you take with your health care professional. See your dentist regularly. Brush and floss your teeth as directed. Before you have any dental work done, tell your dentist you are receiving this medicine. Do not become pregnant while taking this medicine or for 5 months after stopping it. Women should inform their doctor if they wish to become pregnant or think they might be pregnant. There is a potential for serious side effects to an unborn child. Talk to your health care professional or pharmacist for more information. What side effects may I notice from receiving this medicine? Side effects that you should report to your doctor or health care professional as soon as possible: -allergic reactions like skin rash, itching or hives, swelling of the face, lips, or tongue -breathing problems -chest pain -fast, irregular heartbeat -feeling faint or lightheaded, falls -fever, chills, or any other sign of infection -muscle spasms, tightening, or twitches -numbness or tingling -skin blisters or bumps, or is dry, peels, or red -slow healing or unexplained pain in the mouth or jaw -unusual bleeding or bruising Side effects that usually do not require medical attention (Report these to your doctor or health care professional if they continue or are bothersome.): -muscle pain -stomach upset, gas This list may not describe all possible side effects. Call your doctor for  medical advice about side effects. You may report side effects to FDA at 1-800-FDA-1088. Where should I keep my medicine? This medicine is only given in a clinic, doctor's office, or other health care setting and will not be stored at home. NOTE: This sheet is a summary. It may not cover all possible information. If you have questions about this medicine, talk to your doctor, pharmacist, or health care provider.  2015, [employer]/Gold Standard. (2011-07-04 12:37:47)   Return as scheduled.  I have been informed and understand all the instructions given to me. I know to contact the clinic, my physician, or go to the Emergency Department if any problems should occur. I do not have any questions at this time, but understand that I may call the clinic during office hours or the Patient Navigator at [phone] should I have any questions or need assistance in obtaining follow up care.    __________________________________________  _____________  __________ Signature of Patient or Authorized Representative            Date                   Time    __________________________________________ Nurse's Signature

## 2014-01-30 NOTE — Progress Notes (Signed)
Jeffrey [name] presents today for injection per MD orders. Xgeva 120 mg administered SQ in left Abdomen. Administration without incident. Patient tolerated well. Tolerated chemo well.

## 2014-01-30 NOTE — Patient Instructions (Signed)
[place] at [facility]  Discharge Instructions:  I reviewed and provided you a copy of your bone survey test. I recommend Xgeva every 4 weeks for bone strength due to your skull lesion. The major side effects to know about Delton See are the following:  1. Low calcium- this can be avoided by taking 1000-1200 mg of Calcium daily and 600- 1000 units of Vitamin D daily.  I provided you a prescription for Oscal D to be taken 2 tablets daily.  2. Osteonecrosis of jaw- this is very rare.  You may get your teeth cleaned, but you are not to have any jaw or dental surgery (unless in an emergency situation) until we are consulted. Continue treatment as planned. Return in 4 weeks for follow-up appointment to see Dr. Whitney Muse. _______________________________________________________________  Thank you for choosing [place] at [facility] to provide your oncology and hematology care.  To afford each patient quality time with our providers, please arrive at least 15 minutes before your scheduled appointment.  You need to re-schedule your appointment if you arrive 10 or more minutes late.  We strive to give you quality time with our providers, and arriving late affects you and other patients whose appointments are after yours.  Also, if you no show three or more times for appointments you may be dismissed from the clinic.  Again, thank you for choosing [place] at [place] hope is that these requests will allow you access to exceptional care and in a timely manner. _______________________________________________________________  If you have questions after your visit, please contact our office at [phone] between the hours of 8:30 a.m. and 5:00 p.m. Voicemails left after 4:30 p.m. will not be returned until the following business day. _______________________________________________________________  For prescription refill requests,  have your pharmacy contact our office. _______________________________________________________________  Recommendations made by the consultant and any test results will be sent to your referring physician. _______________________________________________________________

## 2014-02-13 ENCOUNTER — Encounter (HOSPITAL_BASED_OUTPATIENT_CLINIC_OR_DEPARTMENT_OTHER): Payer: Managed Care, Other (non HMO)

## 2014-02-13 ENCOUNTER — Encounter (HOSPITAL_BASED_OUTPATIENT_CLINIC_OR_DEPARTMENT_OTHER): Payer: Managed Care, Other (non HMO) | Admitting: Oncology

## 2014-02-13 ENCOUNTER — Inpatient Hospital Stay (HOSPITAL_COMMUNITY): Payer: Medicare Other

## 2014-02-13 ENCOUNTER — Encounter (HOSPITAL_COMMUNITY): Payer: Managed Care, Other (non HMO)

## 2014-02-13 DIAGNOSIS — Z5112 Encounter for antineoplastic immunotherapy: Secondary | ICD-10-CM

## 2014-02-13 DIAGNOSIS — Z85118 Personal history of other malignant neoplasm of bronchus and lung: Secondary | ICD-10-CM | POA: Diagnosis not present

## 2014-02-13 DIAGNOSIS — M25512 Pain in left shoulder: Secondary | ICD-10-CM

## 2014-02-13 DIAGNOSIS — C7951 Secondary malignant neoplasm of bone: Secondary | ICD-10-CM

## 2014-02-13 DIAGNOSIS — C801 Malignant (primary) neoplasm, unspecified: Secondary | ICD-10-CM

## 2014-02-13 DIAGNOSIS — C799 Secondary malignant neoplasm of unspecified site: Secondary | ICD-10-CM

## 2014-02-13 LAB — CBC WITH DIFFERENTIAL/PLATELET
Basophils Absolute: 0.1 10*3/uL (ref 0.0–0.1)
Basophils Relative: 1 % (ref 0–1)
EOS ABS: 0.5 10*3/uL (ref 0.0–0.7)
EOS PCT: 8 % — AB (ref 0–5)
HCT: 30 % — ABNORMAL LOW (ref 39.0–52.0)
Hemoglobin: 9.7 g/dL — ABNORMAL LOW (ref 13.0–17.0)
LYMPHS PCT: 8 % — AB (ref 12–46)
Lymphs Abs: 0.5 10*3/uL — ABNORMAL LOW (ref 0.7–4.0)
MCH: 29.6 pg (ref 26.0–34.0)
MCHC: 32.3 g/dL (ref 30.0–36.0)
MCV: 91.5 fL (ref 78.0–100.0)
Monocytes Absolute: 0.5 10*3/uL (ref 0.1–1.0)
Monocytes Relative: 7 % (ref 3–12)
Neutro Abs: 5.1 10*3/uL (ref 1.7–7.7)
Neutrophils Relative %: 76 % (ref 43–77)
PLATELETS: 235 10*3/uL (ref 150–400)
RBC: 3.28 MIL/uL — ABNORMAL LOW (ref 4.22–5.81)
RDW: 14.5 % (ref 11.5–15.5)
WBC: 6.7 10*3/uL (ref 4.0–10.5)

## 2014-02-13 LAB — COMPREHENSIVE METABOLIC PANEL
ALBUMIN: 3.2 g/dL — AB (ref 3.5–5.2)
ALK PHOS: 66 U/L (ref 39–117)
ALT: 10 U/L (ref 0–53)
ANION GAP: 6 (ref 5–15)
AST: 13 U/L (ref 0–37)
BILIRUBIN TOTAL: 0.4 mg/dL (ref 0.3–1.2)
BUN: 26 mg/dL — ABNORMAL HIGH (ref 6–23)
CO2: 21 mmol/L (ref 19–32)
Calcium: 6.1 mg/dL — CL (ref 8.4–10.5)
Chloride: 107 mmol/L (ref 96–112)
Creatinine, Ser: 1.7 mg/dL — ABNORMAL HIGH (ref 0.50–1.35)
GFR calc non Af Amer: 37 mL/min — ABNORMAL LOW (ref >90)
GFR, EST AFRICAN AMERICAN: 43 mL/min — AB (ref >90)
Glucose, Bld: 113 mg/dL — ABNORMAL HIGH (ref 70–99)
Potassium: 4.7 mmol/L (ref 3.5–5.1)
Sodium: 134 mmol/L — ABNORMAL LOW (ref 135–145)
TOTAL PROTEIN: 7.5 g/dL (ref 6.0–8.3)

## 2014-02-13 LAB — TSH: TSH: 4.346 u[IU]/mL (ref 0.350–4.500)

## 2014-02-13 MED ORDER — SODIUM CHLORIDE 0.9 % IJ SOLN
10.0000 mL | INTRAMUSCULAR | Status: DC | PRN
  Administered 2014-02-13: 10 mL
  Filled 2014-02-13: qty 10

## 2014-02-13 MED ORDER — HEPARIN SOD (PORK) LOCK FLUSH 100 UNIT/ML IV SOLN
500.0000 [IU] | Freq: Once | INTRAVENOUS | Status: AC | PRN
  Administered 2014-02-13: 500 [IU]

## 2014-02-13 MED ORDER — SODIUM CHLORIDE 0.9 % IV SOLN
3.0000 mg/kg | Freq: Once | INTRAVENOUS | Status: AC
  Administered 2014-02-13: 190 mg via INTRAVENOUS
  Filled 2014-02-13: qty 19

## 2014-02-13 MED ORDER — HEPARIN SOD (PORK) LOCK FLUSH 100 UNIT/ML IV SOLN
INTRAVENOUS | Status: AC
  Filled 2014-02-13: qty 5

## 2014-02-13 MED ORDER — HYDROCODONE-ACETAMINOPHEN 10-325 MG PO TABS: 1.0000 | ORAL_TABLET | Freq: Four times a day (QID) | ORAL | Status: DC | PRN

## 2014-02-13 MED ORDER — SODIUM CHLORIDE 0.9 % IV SOLN
2.0000 g | Freq: Once | INTRAVENOUS | Status: AC
  Administered 2014-02-13: 2 g via INTRAVENOUS
  Filled 2014-02-13: qty 20

## 2014-02-13 MED ORDER — SODIUM CHLORIDE 0.9 % IV SOLN
Freq: Once | INTRAVENOUS | Status: AC
  Administered 2014-02-13: 10:00:00 via INTRAVENOUS

## 2014-02-13 NOTE — Progress Notes (Signed)
Patient is seen as a work-in today.  He initially requests a refill on his pain medication and requests an increased dose.  He is currently on Hydrocodone 5/325 mg.  I will increase to 10/325.  He takes pain medication for his left shoulder pain, which his primary care provider, the patient reports, thinks he may need surgical intervention based upon clinical findings.  The patient is not interested in surgery and therefore, we will provide palliative management.   I personally reviewed and went over laboratory results with the patient.  The results are noted within this dictation.  His calcium is noted to be 6.1 which is critically low.  He admits that he is not taking Oscal D.  He is on Xgeva and this will need to be held as a result until calcium normalizes. He will be given IV calcium today (2 g).  Order is placed.   Patient and plan discussed with Dr. Ancil Linsey and she is in agreement with the aforementioned.   Hila Bolding 02/13/2014

## 2014-02-13 NOTE — Progress Notes (Signed)
Tolerated chemo well. 

## 2014-02-13 NOTE — Progress Notes (Signed)
CRITICAL VALUE ALERT Critical value received:  Calcium 6.1 Date of notification:  02/13/2014 Time of notification: 5329 Critical value read back:  Yes.   Nurse who received alert:  Shellia Carwin RN MD notified (1st page):  Caroleen Hamman PA [phone]

## 2014-02-13 NOTE — Patient Instructions (Signed)
[place] at [facility]  Discharge Instructions:  Take Calcium 1200 mg and Vit D [phone] units daily. _______________________________________________________________  Thank you for choosing [place] at [facility] to provide your oncology and hematology care.  To afford each patient quality time with our providers, please arrive at least 15 minutes before your scheduled appointment.  You need to re-schedule your appointment if you arrive 10 or more minutes late.  We strive to give you quality time with our providers, and arriving late affects you and other patients whose appointments are after yours.  Also, if you no show three or more times for appointments you may be dismissed from the clinic.  Again, thank you for choosing [place] at [place] hope is that these requests will allow you access to exceptional care and in a timely manner. _______________________________________________________________  If you have questions after your visit, please contact our office at [phone] between the hours of 8:30 a.m. and 5:00 p.m. Voicemails left after 4:30 p.m. will not be returned until the following business day. _______________________________________________________________  For prescription refill requests, have your pharmacy contact our office. _______________________________________________________________  Recommendations made by the consultant and any test results will be sent to your referring physician. _______________________________________________________________

## 2014-02-13 NOTE — Patient Instructions (Signed)
[facility] Discharge Instructions for Patients Receiving Chemotherapy  Today you received the following chemotherapy agents Opdivo. You were given a Shatoya Roets prescription for pain medication.  To help prevent nausea and vomiting after your treatment, we encourage you to take your nausea medication as instructed.   If you develop nausea and vomiting that is not controlled by your nausea medication, call the clinic. If it is after clinic hours your family physician or the after hours number for the clinic or go to the Emergency Department.   BELOW ARE SYMPTOMS THAT SHOULD BE REPORTED IMMEDIATELY:  *FEVER GREATER THAN 101.0 F  *CHILLS WITH OR WITHOUT FEVER  NAUSEA AND VOMITING THAT IS NOT CONTROLLED WITH YOUR NAUSEA MEDICATION  *UNUSUAL SHORTNESS OF BREATH  *UNUSUAL BRUISING OR BLEEDING  TENDERNESS IN MOUTH AND THROAT WITH OR WITHOUT PRESENCE OF ULCERS  *URINARY PROBLEMS  *BOWEL PROBLEMS  UNUSUAL RASH Items with * indicate a potential emergency and should be followed up as soon as possible.  Return as scheduled.  I have been informed and understand all the instructions given to me. I know to contact the clinic, my physician, or go to the Emergency Department if any problems should occur. I do not have any questions at this time, but understand that I may call the clinic during office hours or the Patient Navigator at [phone] should I have any questions or need assistance in obtaining follow up care.    __________________________________________  _____________  __________ Signature of Patient or Authorized Representative            Date                   Time    __________________________________________ Nurse's Signature

## 2014-02-17 ENCOUNTER — Other Ambulatory Visit: Payer: Self-pay | Admitting: Family Medicine

## 2014-02-17 NOTE — Telephone Encounter (Signed)
Seen 01/15/14

## 2014-02-18 ENCOUNTER — Other Ambulatory Visit (HOSPITAL_COMMUNITY): Payer: Self-pay | Admitting: Oncology

## 2014-02-18 DIAGNOSIS — E039 Hypothyroidism, unspecified: Secondary | ICD-10-CM

## 2014-02-18 MED ORDER — LEVOTHYROXINE SODIUM 50 MCG PO TABS: 100.0000 ug | ORAL_TABLET | Freq: Every day | ORAL | Status: DC

## 2014-02-27 ENCOUNTER — Encounter (HOSPITAL_COMMUNITY): Payer: Managed Care, Other (non HMO)

## 2014-02-27 ENCOUNTER — Encounter (HOSPITAL_BASED_OUTPATIENT_CLINIC_OR_DEPARTMENT_OTHER): Payer: Managed Care, Other (non HMO) | Admitting: Hematology & Oncology

## 2014-02-27 ENCOUNTER — Encounter (HOSPITAL_COMMUNITY): Payer: Managed Care, Other (non HMO) | Attending: Oncology

## 2014-02-27 VITALS — BP 113/72 | HR 80 | Temp 98.3°F | Resp 18 | Wt 136.5 lb

## 2014-02-27 DIAGNOSIS — M2550 Pain in unspecified joint: Secondary | ICD-10-CM

## 2014-02-27 DIAGNOSIS — C801 Malignant (primary) neoplasm, unspecified: Secondary | ICD-10-CM

## 2014-02-27 DIAGNOSIS — C7800 Secondary malignant neoplasm of unspecified lung: Secondary | ICD-10-CM

## 2014-02-27 DIAGNOSIS — C3412 Malignant neoplasm of upper lobe, left bronchus or lung: Secondary | ICD-10-CM | POA: Diagnosis not present

## 2014-02-27 DIAGNOSIS — R918 Other nonspecific abnormal finding of lung field: Secondary | ICD-10-CM | POA: Insufficient documentation

## 2014-02-27 DIAGNOSIS — R319 Hematuria, unspecified: Secondary | ICD-10-CM | POA: Diagnosis not present

## 2014-02-27 DIAGNOSIS — Z5112 Encounter for antineoplastic immunotherapy: Secondary | ICD-10-CM

## 2014-02-27 DIAGNOSIS — C799 Secondary malignant neoplasm of unspecified site: Secondary | ICD-10-CM

## 2014-02-27 DIAGNOSIS — Z85118 Personal history of other malignant neoplasm of bronchus and lung: Secondary | ICD-10-CM | POA: Diagnosis not present

## 2014-02-27 DIAGNOSIS — Z8521 Personal history of malignant neoplasm of larynx: Secondary | ICD-10-CM

## 2014-02-27 DIAGNOSIS — C7951 Secondary malignant neoplasm of bone: Secondary | ICD-10-CM

## 2014-02-27 LAB — COMPREHENSIVE METABOLIC PANEL
ALT: 9 U/L (ref 0–53)
AST: 12 U/L (ref 0–37)
Albumin: 3.1 g/dL — ABNORMAL LOW (ref 3.5–5.2)
Alkaline Phosphatase: 64 U/L (ref 39–117)
Anion gap: 5 (ref 5–15)
BUN: 27 mg/dL — AB (ref 6–23)
CO2: 22 mmol/L (ref 19–32)
CREATININE: 1.61 mg/dL — AB (ref 0.50–1.35)
Calcium: 7.6 mg/dL — ABNORMAL LOW (ref 8.4–10.5)
Chloride: 109 mmol/L (ref 96–112)
GFR calc Af Amer: 46 mL/min — ABNORMAL LOW (ref >90)
GFR, EST NON AFRICAN AMERICAN: 39 mL/min — AB (ref >90)
GLUCOSE: 99 mg/dL (ref 70–99)
Potassium: 5 mmol/L (ref 3.5–5.1)
SODIUM: 136 mmol/L (ref 135–145)
TOTAL PROTEIN: 7.2 g/dL (ref 6.0–8.3)
Total Bilirubin: 0.6 mg/dL (ref 0.3–1.2)

## 2014-02-27 LAB — URINALYSIS, ROUTINE W REFLEX MICROSCOPIC
GLUCOSE, UA: NEGATIVE mg/dL
Ketones, ur: NEGATIVE mg/dL
Nitrite: NEGATIVE
PH: 6 (ref 5.0–8.0)
Protein, ur: NEGATIVE mg/dL
SPECIFIC GRAVITY, URINE: 1.01 (ref 1.005–1.030)
UROBILINOGEN UA: 0.2 mg/dL (ref 0.0–1.0)

## 2014-02-27 LAB — CBC WITH DIFFERENTIAL/PLATELET
Basophils Absolute: 0 10*3/uL (ref 0.0–0.1)
Basophils Relative: 1 % (ref 0–1)
EOS ABS: 0.5 10*3/uL (ref 0.0–0.7)
EOS PCT: 7 % — AB (ref 0–5)
HCT: 31.3 % — ABNORMAL LOW (ref 39.0–52.0)
HEMOGLOBIN: 10 g/dL — AB (ref 13.0–17.0)
LYMPHS ABS: 0.5 10*3/uL — AB (ref 0.7–4.0)
LYMPHS PCT: 6 % — AB (ref 12–46)
MCH: 29.3 pg (ref 26.0–34.0)
MCHC: 31.9 g/dL (ref 30.0–36.0)
MCV: 91.8 fL (ref 78.0–100.0)
MONOS PCT: 8 % (ref 3–12)
Monocytes Absolute: 0.6 10*3/uL (ref 0.1–1.0)
NEUTROS PCT: 78 % — AB (ref 43–77)
Neutro Abs: 5.5 10*3/uL (ref 1.7–7.7)
Platelets: 255 10*3/uL (ref 150–400)
RBC: 3.41 MIL/uL — AB (ref 4.22–5.81)
RDW: 14.3 % (ref 11.5–15.5)
WBC: 7 10*3/uL (ref 4.0–10.5)

## 2014-02-27 LAB — URINE MICROSCOPIC-ADD ON

## 2014-02-27 MED ORDER — SODIUM CHLORIDE 0.9 % IV SOLN
3.0000 mg/kg | Freq: Once | INTRAVENOUS | Status: AC
  Administered 2014-02-27: 190 mg via INTRAVENOUS
  Filled 2014-02-27: qty 19

## 2014-02-27 MED ORDER — HEPARIN SOD (PORK) LOCK FLUSH 100 UNIT/ML IV SOLN
INTRAVENOUS | Status: AC
  Filled 2014-02-27: qty 5

## 2014-02-27 MED ORDER — SODIUM CHLORIDE 0.9 % IV SOLN
Freq: Once | INTRAVENOUS | Status: AC
  Administered 2014-02-27: 11:00:00 via INTRAVENOUS

## 2014-02-27 MED ORDER — HEPARIN SOD (PORK) LOCK FLUSH 100 UNIT/ML IV SOLN
500.0000 [IU] | Freq: Once | INTRAVENOUS | Status: AC | PRN
  Administered 2014-02-27: 500 [IU]

## 2014-02-27 MED ORDER — SODIUM CHLORIDE 0.9 % IJ SOLN: 10.0000 mL | INTRAMUSCULAR | Status: DC | PRN

## 2014-02-27 NOTE — Patient Instructions (Signed)
[place] at [facility]  Discharge Instructions:  Exam and discussion by Dr. Whitney Muse. Will treat you today.  If you experience any symptoms like you did after last treatment, you need to call us.  Follow-up in 2 weeks _______________________________________________________________  Thank you for choosing [place] at [facility] to provide your oncology and hematology care.  To afford each patient quality time with our providers, please arrive at least 15 minutes before your scheduled appointment.  You need to re-schedule your appointment if you arrive 10 or more minutes late.  We strive to give you quality time with our providers, and arriving late affects you and other patients whose appointments are after yours.  Also, if you no show three or more times for appointments you may be dismissed from the clinic.  Again, thank you for choosing [place] at [place] hope is that these requests will allow you access to exceptional care and in a timely manner. _______________________________________________________________  If you have questions after your visit, please contact our office at [phone] between the hours of 8:30 a.m. and 5:00 p.m. Voicemails left after 4:30 p.m. will not be returned until the following business day. _______________________________________________________________  For prescription refill requests, have your pharmacy contact our office. _______________________________________________________________  Recommendations made by the consultant and any test results will be sent to your referring physician. _______________________________________________________________

## 2014-02-27 NOTE — Progress Notes (Signed)
Calcium low x-geva held

## 2014-02-27 NOTE — Progress Notes (Signed)
Jeffrey [name] Tolerated chemotherapy today.  Discharged ambulatory

## 2014-02-27 NOTE — Patient Instructions (Signed)
[facility] Discharge Instructions for Patients Receiving Chemotherapy  Today you received the following chemotherapy agents opdivo  To help prevent nausea and vomiting after your treatment, we encourage you to take your nausea medication    If you develop nausea and vomiting that is not controlled by your nausea medication, call the clinic. If it is after clinic hours your family physician or the after hours number for the clinic or go to the Emergency Department.   BELOW ARE SYMPTOMS THAT SHOULD BE REPORTED IMMEDIATELY:  *FEVER GREATER THAN 101.0 F  *CHILLS WITH OR WITHOUT FEVER  NAUSEA AND VOMITING THAT IS NOT CONTROLLED WITH YOUR NAUSEA MEDICATION  *UNUSUAL SHORTNESS OF BREATH  *UNUSUAL BRUISING OR BLEEDING  TENDERNESS IN MOUTH AND THROAT WITH OR WITHOUT PRESENCE OF ULCERS  *URINARY PROBLEMS  *BOWEL PROBLEMS  UNUSUAL RASH Items with * indicate a potential emergency and should be followed up as soon as possible.  One of the nurses will contact you 24 hours after your treatment. Please let the nurse know about any problems that you may have experienced. Feel free to call the clinic you have any questions or concerns. The clinic phone number is [phone].   I have been informed and understand all the instructions given to me. I know to contact the clinic, my physician, or go to the Emergency Department if any problems should occur. I do not have any questions at this time, but understand that I may call the clinic during office hours or the Patient Navigator at [phone] should I have any questions or need assistance in obtaining follow up care.

## 2014-02-27 NOTE — Progress Notes (Signed)
Jeffrey Battiest, MD [place] [address]    DIAGNOSIS: Metastatic cancer   Staging form: Lung, AJCC 7th Edition     Clinical: Stage IV (T1, N0, M1a) - Unsigned   SUMMARY OF ONCOLOGIC HISTORY:   Metastatic cancer   02/17/2011 Imaging PET scan- B/L hypermetabolic pulmonary nodules (3 in total) are most consistent with metastatic disease. No evidence of mediastinal nodal metastasis.  No evidence of metastasis in the abdomen or pelvis.   06/19/2012 Imaging CT scan of chest- Slight enlargement of pulmonary nodules/metastasis.    07/31/2012 - 11/12/2012 Chemotherapy Carboplatin/Paclitaxel x 6 cycles   11/26/2012 Imaging CT CAP- Similar size of a R apical pulm nodule.  Similar to improved left lower lobe nodularity, favored to be post infectious or inflammatory. Similar nodularity in the right middle lobe. No evidence of new or progressive disease. Similar borderline adeno    12/10/2012 - 05/07/2013 Chemotherapy Tarceva 150 mg daily   05/03/2013 Progression CT CAP- Interval increase in size of right upper lobe adjacent pulmonary nodules is highly concerning for progression of lung cancer recurrence.   07/31/2013 -  Chemotherapy Nivolumab   10/21/2013 Imaging CT CAP- The dominant right apical mass is slightly increased in size from previous exam. The adjacent nodule is stable.   10/31/2013 Imaging CT of head- Intermediate density subdural collection/hematoma along the convexity on the right, maximal thickness 7 mm. 2 mm of right-to-left shift. No hyper acute hemorrhage.  Lytic lesion right posterior frontal calvarium, new since the previous study.   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 Widespread underlying dural thickening and enhancement in the R hemisphere suspicious for meningeal involvement.   12/24/2013 Imaging CT CAP- No clear enlargement of right apical mass. Stable right  middle lobe nodule. Mild mediastinal lymphadenopathy.   12/26/2013 Procedure Spinal CSF testing- negative for malignancy.   01/14/2014 Imaging Skeletal survey- Lytic lesion noted in the right frontoparietal region as noted on recent head CT of 10/31/2013. This is consist with metastatic disease. No other focal bony lesions noted.    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 He also has a history of laryngeal carcinoma. He is currently on Nivolumab. He states he has done well with therapy until this last treatment when he developed fever. In addition he reports hematuria. He states it lasted several days. But has now resolved. Also reported a decrease in his appetite. He had some nausea. All of these symptoms have now resolved. His ongoing shoulder pain, but this is chronic and unchanged.  He describes his mood is good. He has no other major complaints today.  MEDICAL HISTORY: Past Medical History  Diagnosis Date  . Tracheostomy in place   . Cancer 2010    laryngeal  . Lung cancer 2010  . Pneumonia 2014  . Dysrhythmia     takes Coreg and Lisinopril daily  . Shortness of breath     with exertion  . Enlarged prostate     takes flomax  . Urinary urgency   . Gastric ulcer     hx of  . Hx of transfusion of packed red blood cells     no abnormal reaction noted by  patient  . Umbilical hernia   . Metastatic cancer 07/24/2012  . Hyperlipidemia   . ED (erectile dysfunction)   . Insomnia   . CHF (congestive heart failure)   . 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     has No Known Allergies.  Jeffrey [name] does not currently have medications on file.  SURGICAL HISTORY: Past Surgical History  Procedure Laterality Date  . Tracheostomy  2010  . Lung removal, partial  2010    left side  . Layngectomy for laryngeal cancer in2010; left upper lobectomy for lung cancer, 2010.    . Back surgery      thinks it was done in 2012  . Esophagogastroduodenoscopy    . Colonoscopy    . Portacath placement Bilateral 07/12/2012    Procedure: INSERTION PORT-A-CATH;  Surgeon: [name], MD;  Location: [place];  Service: Thoracic;  Laterality: Bilateral;    SOCIAL HISTORY: History   Social History  . Marital Status: Married    Spouse Name: N/A  . Number of Children: 1  . Years of Education: N/A   Occupational History  . Not on file.   Social History Main Topics  . Smoking status: Current Every Day Smoker -- 1.00 packs/day for 70 years    Types: Cigarettes  . Smokeless tobacco: Never Used  . Alcohol Use: No  . Drug Use: No  . Sexual Activity: Not Currently   Other Topics Concern  . Not on file   Social History Narrative    FAMILY HISTORY: Family History  Problem Relation Age of Onset  . Diabetes Father   . Heart disease Father     Review of Systems  Constitutional:       See HPI  HENT: Positive for hearing loss.   Eyes: Negative.   Respiratory: Positive for cough.   Cardiovascular: Negative.   Gastrointestinal: Negative.   Genitourinary: Negative.   Musculoskeletal: Positive for joint pain.  Skin: Negative.   Neurological: Negative.   Endo/Heme/Allergies: Negative.   Psychiatric/Behavioral: Negative.     PHYSICAL EXAMINATION  ECOG PERFORMANCE  STATUS: 1 - Symptomatic but completely ambulatory  Filed Vitals:   02/27/14 0908  BP: 113/72  Pulse: 80  Temp: 98.3 F (36.8 C)  Resp: 18    Physical Exam  Constitutional: He is oriented to person, place, and time.  Thin pleasant male, tracheostomy site  HENT:  Head: Normocephalic and atraumatic.  Nose: Nose normal.  Mouth/Throat: Oropharynx is clear and moist. No oropharyngeal exudate.  Eyes: Conjunctivae and EOM are normal. Pupils are equal, round, and reactive to light. Right eye exhibits no discharge. Left eye exhibits no discharge. No scleral icterus.  Neck: Normal range of motion. Neck supple. No tracheal deviation present. No thyromegaly present.  Cardiovascular: Normal rate, regular rhythm and normal heart sounds.  Exam reveals no gallop and no friction rub.   No murmur heard. Pulmonary/Chest: Effort normal. He has no wheezes. He has no rales.  Occasional coarse BS that clear with cough  Abdominal: Soft. Bowel sounds are normal. He exhibits no distension and no mass.  There is no tenderness. There is no rebound and no guarding.  Musculoskeletal: Normal range of motion. He exhibits no edema.  Lymphadenopathy:    He has no cervical adenopathy.  Neurological: He is alert and oriented to person, place, and time. He has normal reflexes. No cranial nerve deficit. Gait normal. Coordination normal.  Skin: Skin is warm and dry. No rash noted.  Psychiatric: Mood, memory, affect and judgment normal.  Nursing note and vitals reviewed.   LABORATORY DATA:  CBC    Component Value Date/Time   WBC 8.9 03/13/2014 0858   RBC 3.52* 03/13/2014 0858   HGB 10.0* 03/13/2014 0858   HCT 31.5* 03/13/2014 0858   PLT 220 03/13/2014 0858   MCV 89.5 03/13/2014 0858   MCH 28.4 03/13/2014 0858   MCHC 31.7 03/13/2014 0858   RDW 14.5 03/13/2014 0858   LYMPHSABS 0.5* 03/13/2014 0858   MONOABS 0.6 03/13/2014 0858   EOSABS 0.5 03/13/2014 0858   BASOSABS 0.0 03/13/2014 0858   CMP     Component  Value Date/Time   NA 133* 03/13/2014 0858   K 4.3 03/13/2014 0858   CL 110 03/13/2014 0858   CO2 22 03/13/2014 0858   GLUCOSE 116* 03/13/2014 0858   BUN 26* 03/13/2014 0858   CREATININE 1.51* 03/13/2014 0858   CREATININE 1.46* 11/12/2012 0839   CALCIUM 7.3* 03/13/2014 0858   CALCIUM 8.5 01/26/2012 1312   PROT 7.2 03/13/2014 0858   ALBUMIN 3.2* 03/13/2014 0858   AST 13 03/13/2014 0858   ALT 9 03/13/2014 0858   ALKPHOS 56 03/13/2014 0858   BILITOT 0.4 03/13/2014 0858   GFRNONAA 42* 03/13/2014 0858   GFRAA 68* 03/13/2014 0858     ASSESSMENT and THERAPY PLAN:    Metastatic cancer As an 81 year old male with a history of laryngeal carcinoma. He is now been diagnosed with stage IV squamous cell carcinoma felt to be either lung primary versus recurrent laryngeal carcinoma. He is currently on Nivolumab and overall has tolerated it well. After his last cycle he had multiple symptoms and it is uncertain whether is from chemotherapy. Wishes to proceed with treatment today and I have advised he and his wife to call us should he develop any additional symptoms similar to after his last cycle. He will need reimaging soon and we addressed this today. We will see him back again in 2 weeks with repeat laboratory studies and his next cycle of chemotherapy.   Hematuria He has had several episodes of mild hematuria. His blood counts are stable today. His kidney function is unchanged. I have recommended a urine analysis to rule out the possibility of infection. I have advised him I will let him know the results of his study when it becomes available. If he needs an antibiotic we will call this in for him. I have also encouraged him that if his symptoms recur to please call us. We can consider urology referral if necessary.   All questions were answered. The patient knows to call the clinic with any problems, questions or concerns. We can certainly see the patient much sooner if  necessary.  Molli Hazard 03/17/2014

## 2014-02-28 ENCOUNTER — Other Ambulatory Visit (HOSPITAL_COMMUNITY): Payer: Self-pay | Admitting: Hematology & Oncology

## 2014-02-28 MED ORDER — CIPROFLOXACIN HCL 250 MG PO TABS: 250.0000 mg | ORAL_TABLET | Freq: Two times a day (BID) | ORAL | Status: DC

## 2014-03-13 ENCOUNTER — Encounter (HOSPITAL_COMMUNITY): Payer: Self-pay | Admitting: Oncology

## 2014-03-13 ENCOUNTER — Encounter (HOSPITAL_BASED_OUTPATIENT_CLINIC_OR_DEPARTMENT_OTHER): Payer: Managed Care, Other (non HMO) | Admitting: Oncology

## 2014-03-13 ENCOUNTER — Encounter (HOSPITAL_COMMUNITY): Payer: Managed Care, Other (non HMO)

## 2014-03-13 ENCOUNTER — Encounter (HOSPITAL_COMMUNITY): Payer: Managed Care, Other (non HMO) | Attending: Hematology & Oncology

## 2014-03-13 VITALS — BP 96/77 | HR 94 | Temp 99.1°F | Resp 18 | Wt 137.6 lb

## 2014-03-13 DIAGNOSIS — C7951 Secondary malignant neoplasm of bone: Secondary | ICD-10-CM | POA: Diagnosis not present

## 2014-03-13 DIAGNOSIS — M25512 Pain in left shoulder: Secondary | ICD-10-CM | POA: Diagnosis not present

## 2014-03-13 DIAGNOSIS — C801 Malignant (primary) neoplasm, unspecified: Secondary | ICD-10-CM | POA: Insufficient documentation

## 2014-03-13 DIAGNOSIS — C799 Secondary malignant neoplasm of unspecified site: Secondary | ICD-10-CM

## 2014-03-13 DIAGNOSIS — Z85118 Personal history of other malignant neoplasm of bronchus and lung: Secondary | ICD-10-CM | POA: Diagnosis not present

## 2014-03-13 DIAGNOSIS — C3491 Malignant neoplasm of unspecified part of right bronchus or lung: Secondary | ICD-10-CM | POA: Diagnosis not present

## 2014-03-13 DIAGNOSIS — Z5112 Encounter for antineoplastic immunotherapy: Secondary | ICD-10-CM

## 2014-03-13 DIAGNOSIS — F17210 Nicotine dependence, cigarettes, uncomplicated: Secondary | ICD-10-CM | POA: Insufficient documentation

## 2014-03-13 LAB — COMPREHENSIVE METABOLIC PANEL
ALK PHOS: 56 U/L (ref 39–117)
ALT: 9 U/L (ref 0–53)
AST: 13 U/L (ref 0–37)
Albumin: 3.2 g/dL — ABNORMAL LOW (ref 3.5–5.2)
BUN: 26 mg/dL — ABNORMAL HIGH (ref 6–23)
CALCIUM: 7.3 mg/dL — AB (ref 8.4–10.5)
CO2: 22 mmol/L (ref 19–32)
Chloride: 110 mmol/L (ref 96–112)
Creatinine, Ser: 1.51 mg/dL — ABNORMAL HIGH (ref 0.50–1.35)
GFR calc Af Amer: 49 mL/min — ABNORMAL LOW (ref >90)
GFR, EST NON AFRICAN AMERICAN: 42 mL/min — AB (ref >90)
Glucose, Bld: 116 mg/dL — ABNORMAL HIGH (ref 70–99)
POTASSIUM: 4.3 mmol/L (ref 3.5–5.1)
Sodium: 133 mmol/L — ABNORMAL LOW (ref 135–145)
Total Bilirubin: 0.4 mg/dL (ref 0.3–1.2)
Total Protein: 7.2 g/dL (ref 6.0–8.3)

## 2014-03-13 LAB — CBC WITH DIFFERENTIAL/PLATELET
Basophils Absolute: 0 10*3/uL (ref 0.0–0.1)
Basophils Relative: 0 % (ref 0–1)
EOS PCT: 5 % (ref 0–5)
Eosinophils Absolute: 0.5 10*3/uL (ref 0.0–0.7)
HCT: 31.5 % — ABNORMAL LOW (ref 39.0–52.0)
Hemoglobin: 10 g/dL — ABNORMAL LOW (ref 13.0–17.0)
LYMPHS ABS: 0.5 10*3/uL — AB (ref 0.7–4.0)
LYMPHS PCT: 5 % — AB (ref 12–46)
MCH: 28.4 pg (ref 26.0–34.0)
MCHC: 31.7 g/dL (ref 30.0–36.0)
MCV: 89.5 fL (ref 78.0–100.0)
Monocytes Absolute: 0.6 10*3/uL (ref 0.1–1.0)
Monocytes Relative: 6 % (ref 3–12)
NEUTROS PCT: 84 % — AB (ref 43–77)
Neutro Abs: 7.4 10*3/uL (ref 1.7–7.7)
PLATELETS: 220 10*3/uL (ref 150–400)
RBC: 3.52 MIL/uL — AB (ref 4.22–5.81)
RDW: 14.5 % (ref 11.5–15.5)
WBC: 8.9 10*3/uL (ref 4.0–10.5)

## 2014-03-13 LAB — TSH: TSH: 3.743 u[IU]/mL (ref 0.350–4.500)

## 2014-03-13 MED ORDER — HEPARIN SOD (PORK) LOCK FLUSH 100 UNIT/ML IV SOLN
500.0000 [IU] | Freq: Once | INTRAVENOUS | Status: AC | PRN
  Administered 2014-03-13: 500 [IU]
  Filled 2014-03-13: qty 5

## 2014-03-13 MED ORDER — MORPHINE SULFATE ER 15 MG PO TBCR: 15.0000 mg | EXTENDED_RELEASE_TABLET | Freq: Two times a day (BID) | ORAL | Status: DC

## 2014-03-13 MED ORDER — SODIUM CHLORIDE 0.9 % IJ SOLN: 10.0000 mL | INTRAMUSCULAR | Status: DC | PRN

## 2014-03-13 MED ORDER — SODIUM CHLORIDE 0.9 % IV SOLN
2.0000 g | Freq: Once | INTRAVENOUS | Status: DC
  Filled 2014-03-13: qty 20

## 2014-03-13 MED ORDER — SODIUM CHLORIDE 0.9 % IV SOLN
Freq: Once | INTRAVENOUS | Status: AC
  Administered 2014-03-13: 10:00:00 via INTRAVENOUS

## 2014-03-13 MED ORDER — NIVOLUMAB CHEMO INJECTION 100 MG/10ML
3.0000 mg/kg | Freq: Once | INTRAVENOUS | Status: AC
  Administered 2014-03-13: 190 mg via INTRAVENOUS
  Filled 2014-03-13: qty 19

## 2014-03-13 NOTE — Patient Instructions (Signed)
[facility] Discharge Instructions for Patients Receiving Chemotherapy  Today you received the following chemotherapy agents opdivo Please take your 2000mg  of calcium and 1000 of vitamin D, but add 1000 mg of tums to help build your calcium level up. Please call the clinic if you have any questions or concerns  To help prevent nausea and vomiting after your treatment, we encourage you to take your nausea medication   If you develop nausea and vomiting that is not controlled by your nausea medication, call the clinic. If it is after clinic hours your family physician or the after hours number for the clinic or go to the Emergency Department.   BELOW ARE SYMPTOMS THAT SHOULD BE REPORTED IMMEDIATELY:  *FEVER GREATER THAN 101.0 F  *CHILLS WITH OR WITHOUT FEVER  NAUSEA AND VOMITING THAT IS NOT CONTROLLED WITH YOUR NAUSEA MEDICATION  *UNUSUAL SHORTNESS OF BREATH  *UNUSUAL BRUISING OR BLEEDING  TENDERNESS IN MOUTH AND THROAT WITH OR WITHOUT PRESENCE OF ULCERS  *URINARY PROBLEMS  *BOWEL PROBLEMS  UNUSUAL RASH Items with * indicate a potential emergency and should be followed up as soon as possible.  One of the nurses will contact you 24 hours after your treatment. Please let the nurse know about any problems that you may have experienced. Feel free to call the clinic you have any questions or concerns. The clinic phone number is [phone].   I have been informed and understand all the instructions given to me. I know to contact the clinic, my physician, or go to the Emergency Department if any problems should occur. I do not have any questions at this time, but understand that I may call the clinic during office hours or the Patient Navigator at [phone] should I have any questions or need assistance in obtaining follow up care.

## 2014-03-13 NOTE — Progress Notes (Addendum)
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 From 07/31/2012 until 11/12/2012 patient received 6 cycles of carboplatin and Taxol because of enlargement of pulmonary nodules. He was placed on maintenance Tarceva on 12/10/2012 which was stopped after repeat CT scan on 05/01/2013 showed progression of disease. Patient not a candidate for SBRT. Therefore, he started Nivolumab on 07/31/2013.    Metastatic cancer   02/17/2011 Imaging PET scan- B/L hypermetabolic pulmonary nodules (3 in total) are most consistent with metastatic disease. No evidence of mediastinal nodal metastasis.  No evidence of metastasis in the abdomen or pelvis.   06/19/2012 Imaging CT scan of chest- Slight enlargement of pulmonary nodules/metastasis.    07/31/2012 - 11/12/2012 Chemotherapy Carboplatin/Paclitaxel x 6 cycles   11/26/2012 Imaging CT CAP- Similar size of a R apical pulm nodule.  Similar to improved left lower lobe nodularity, favored to be post infectious or inflammatory. Similar nodularity in the right middle lobe. No evidence of new or progressive disease. Similar borderline adeno    12/10/2012 - 05/07/2013 Chemotherapy Tarceva 150 mg daily   05/03/2013 Progression CT CAP- Interval increase in  size of right upper lobe adjacent pulmonary nodules is highly concerning for progression of lung cancer recurrence.   07/31/2013 -  Chemotherapy Nivolumab   10/21/2013 Imaging CT CAP- The dominant right apical mass is slightly increased in size from previous exam. The adjacent nodule is stable.   10/31/2013 Imaging CT of head- Intermediate density subdural collection/hematoma along the convexity on the right, maximal thickness 7 mm. 2 mm of right-to-left shift. No hyper acute hemorrhage.  Lytic lesion right posterior frontal calvarium, new since the previous study.   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 Widespread underlying dural thickening and enhancement in the R hemisphere suspicious for meningeal involvement.   12/24/2013 Imaging CT CAP- No clear enlargement of right apical mass. Stable right middle lobe nodule. Mild mediastinal lymphadenopathy.   12/26/2013 Procedure Spinal CSF testing- negative for malignancy.   01/14/2014 Imaging Skeletal survey- Lytic lesion noted in the right frontoparietal region as noted on recent head CT of 10/31/2013. This is consist with metastatic disease. No other focal bony lesions noted.    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  The results are noted within this dictation.  He continues to tolerate therapy well.  He notes some increased hair loss that I cannot appreciate on exam.  Nivolumab is not the cause of this.  His TSH, most recently, is WNL.    His left shoulder pain continues to cause him issues.  He is taking prescribed pain medication, but he continues to quarter it.  He is again educated that this is not an effective dose for pain control.  Therefore, I will pursue another route.  I have offered him a low dose,  long-acting opiod.  I provided him education regarding the medication and its use.  He will still have short acting pain medication at home if needed.  He understands the role of this medication in particular and wants to pursue this intervention.  I hope this provides improved pain relief.  Oncologically, he denies any complaints and ROS questioning is negative.  Past Medical History  Diagnosis Date  . Tracheostomy in place   . Cancer 2010    laryngeal  . Lung cancer 2010  . Pneumonia 2014  . Dysrhythmia     takes Coreg and Lisinopril daily  . Shortness of breath     with exertion  . Enlarged prostate     takes flomax  . Urinary urgency   . Gastric ulcer     hx of  . Hx of transfusion of packed red blood cells     no abnormal reaction noted by patient  . Umbilical hernia   . Metastatic cancer 07/24/2012  . Hyperlipidemia   . ED (erectile dysfunction)   . Insomnia   . CHF (congestive heart failure)   . 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     has No Known Allergies.  [name] does not currently have medications on file.  Past Surgical History  Procedure Laterality Date  . Tracheostomy  2010  . Lung removal, partial  2010    left side  . Layngectomy for laryngeal cancer in2010; left upper lobectomy for lung cancer, 2010.    . Back surgery      thinks it was done in 2012  . Esophagogastroduodenoscopy    . Colonoscopy    . Portacath placement Bilateral 07/12/2012    Procedure: INSERTION  PORT-A-CATH;  Surgeon: [name], MD;  Location: [facility] OR;  Service: Thoracic;  Laterality: Bilateral;    Denies any headaches, dizziness, double vision, fevers, chills, night sweats, nausea, vomiting, diarrhea, constipation, chest pain, heart palpitations, shortness of breath, blood in stool, black tarry stool, urinary pain, urinary burning, urinary frequency, hematuria.   PHYSICAL EXAMINATION  ECOG PERFORMANCE STATUS: 1 - Symptomatic but completely ambulatory  Filed Vitals:   03/13/14 0848  BP: 96/77  Pulse: 94  Temp: 99.1 F (37.3 C)  Resp: 18    GENERAL:alert, no distress, well nourished, well developed, comfortable, cooperative and smiling, accompanied by his mother-in-law SKIN: skin color, texture, turgor are normal, no rashes or significant lesions HEAD: Normocephalic, No masses, lesions, tenderness or abnormalities EYES: normal, PERRLA, EOMI, Conjunctiva are pink and non-injected EARS: External ears normal OROPHARYNX:lips, buccal mucosa, and tongue normal and mucous membranes are moist  NECK: supple, no adenopathy, thyroid normal size, non-tender, without nodularity, trachea midline LYMPH:  no palpable lymphadenopathy, no hepatosplenomegaly BREAST:not examined LUNGS: clear to auscultation and percussion HEART: regular rate & rhythm, no murmurs, no gallops, S1 normal and S2 normal  ABDOMEN:abdomen soft, non-tender and normal bowel sounds BACK: Back symmetric, no curvature. EXTREMITIES:less then 2 second capillary refill, no joint deformities, effusion, or inflammation, no skin discoloration, no clubbing, no cyanosis.  Left shoulder pain with abduction and extension and associated decreased ROM.  NEURO: alert & oriented x 3 with fluent speech, no focal motor/sensory deficits, gait normal   LABORATORY DATA: CBC    Component Value Date/Time   WBC 8.9 03/13/2014 0858   RBC 3.52* 03/13/2014 0858   HGB 10.0* 03/13/2014 0858   HCT 31.5* 03/13/2014 0858   PLT 220  03/13/2014 0858   MCV 89.5 03/13/2014 0858   MCH 28.4 03/13/2014 0858   MCHC 31.7 03/13/2014 0858   RDW 14.5 03/13/2014 0858   LYMPHSABS 0.5* 03/13/2014 0858   MONOABS 0.6 03/13/2014 0858   EOSABS 0.5 03/13/2014 0858   BASOSABS 0.0 03/13/2014 0858      Chemistry      Component Value Date/Time   NA 133* 03/13/2014 0858   K 4.3 03/13/2014 0858   CL 110 03/13/2014 0858   CO2 22 03/13/2014 0858   BUN 26* 03/13/2014 0858   CREATININE 1.51* 03/13/2014 0858   CREATININE 1.46* 11/12/2012 0839      Component Value Date/Time   CALCIUM 7.3* 03/13/2014 0858   CALCIUM 8.5 01/26/2012 1312   ALKPHOS 56 03/13/2014 0858   AST 13 03/13/2014 0858   ALT 9 03/13/2014 0858   BILITOT 0.4 03/13/2014 0858       ASSESSMENT AND PLAN:  Metastatic cancer Progressive metastatic squamous cell carcinoma lung with lung metastases, status post left upper lobe nodule resection 4.5 cm performed on 03/17/2008 along with resection of a supraglottic larynx mass at the same time, no lymph node involvement. From 07/31/2012 until 11/12/2012 patient received 6 cycles of carboplatin and Taxol because of enlargement of pulmonary nodules. He was placed on maintenance Tarceva on 12/10/2012 which was stopped after repeat CT scan on 05/01/2013 showed progression of disease. Patient not a candidate for SBRT. Therefore, he started Nivolumab on 07/31/2013, tolerating well. Continue with pre-chemo labs.  CT CAP for restaging in 2 weeks followed by a return appointment to review data and to learn of future therapeutic options.    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 Widespread dural thickening and enhancement in right hemisphere concerning for meningeal spread. Clinically stable without mental status changes and headaches. Negative cytology on CSF examination.  Future imaging is indicated. Pre-chemo  labs today as planned.  Continue with treatment every 2 weeks. Continue Xgeva every month for bone metastasis with Oscal D BID to avoid hypocalcemia.   Left shoulder pain Taking Hydrocodone/APAP 10/325 and quartering dose for pain control.  This dose is unlikely to be very effective for his pain, but he it makes him drowsy and want to sleep.  He then has difficulty managing the pain once it occurs.  He refuses orthopod work-up as this is likley a tendinopathy.  Therefore, I will give an Rx for MS Contin 15 mg BID as a long-acting pain medication.  Hopefully this will better manage his pain, but his will still have Hydrocodone for breakthrough pain management.   Hypocalcemia He is compliant now with Oscal D BID.  His hypocalcemia persists.  We will hold Xgeva today.  I ordered 2 g of calcium IV today, but the patient reported intolerance to this medication in the past that he  did not report to [place].  Therefore, he will continue with Ca and Vit D and I will recommend taking 1000 mg of Tums daily. Hopefully this is improved in the near future to allow for continuation of Xgeva.   THERAPY PLAN:  He is due for upcoming restaging scans which will be performed in 2 weeks.  He will return following scans to review data.  All questions were answered. The patient knows to call the clinic with any problems, questions or concerns. We can certainly see the patient much sooner if necessary.  Patient and plan discussed with Dr. Ancil Linsey and she is in agreement with the aforementioned.   This note is electronically signed by: Robynn Pane 03/13/2014 10:41 AM

## 2014-03-13 NOTE — Assessment & Plan Note (Addendum)
Taking Hydrocodone/APAP 10/325 and quartering dose for pain control.  This dose is unlikely to be very effective for his pain, but he it makes him drowsy and want to sleep.  He then has difficulty managing the pain once it occurs.  He refuses orthopod work-up as this is likley a tendinopathy.  Therefore, I will give an Rx for MS Contin 15 mg BID as a long-acting pain medication.  Hopefully this will better manage his pain, but his will still have Hydrocodone for breakthrough pain management.

## 2014-03-13 NOTE — Assessment & Plan Note (Addendum)
He is compliant now with Oscal D BID.  His hypocalcemia persists.  We will hold Xgeva today.  I ordered 2 g of calcium IV today, but the patient reported intolerance to this medication in the past that he did not report to [place].  Therefore, he will continue with Ca and Vit D and I will recommend taking 1000 mg of Tums daily. Hopefully this is improved in the near future to allow for continuation of Xgeva.

## 2014-03-13 NOTE — Patient Instructions (Signed)
[place] at [facility] Discharge Instructions  RECOMMENDATIONS MADE BY THE CONSULTANT AND ANY TEST RESULTS WILL BE SENT TO YOUR REFERRING PHYSICIAN.  Exam and discussion by Robynn Pane, PA-C.   Will give you a long acting pain medication.  You need to take this every 12 hours whether you think you need it or not.  It's to keep pain medication in your system to keep you from hurting.  You can still take your other pain medication for pain not relieved by the MS Contin.  MS Contin 15 mg every 12 hours  CT scans in March  Follow-up with Dr. Whitney Muse after scans.  Thank you for choosing [place] at [facility] to provide your oncology and hematology care.  To afford each patient quality time with our provider, please arrive at least 15 minutes before your scheduled appointment time.    You need to re-schedule your appointment should you arrive 10 or more minutes late.  We strive to give you quality time with our providers, and arriving late affects you and other patients whose appointments are after yours.  Also, if you no show three or more times for appointments you may be dismissed from the clinic at the providers discretion.     Again, thank you for choosing [facility].  Our hope is that these requests will decrease the amount of time that you wait before being seen by our physicians.       _____________________________________________________________  Should you have questions after your visit to [facility], please contact our office at [phone] between the hours of 8:30 a.m. and 4:30 p.m.  Voicemails left after 4:30 p.m. will not be returned until the following business day.  For prescription refill requests, have your pharmacy contact our office.

## 2014-03-13 NOTE — Assessment & Plan Note (Signed)
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 Widespread dural thickening and enhancement in right hemisphere concerning for meningeal spread. Clinically stable without mental status changes and headaches. Negative cytology on CSF examination.  Future imaging is indicated. Pre-chemo labs today as planned.  Continue with treatment every 2 weeks. Continue Xgeva every month for bone metastasis with Oscal D BID to avoid hypocalcemia.

## 2014-03-13 NOTE — Progress Notes (Signed)
Jeffrey [name] Tolerated chemotherapy well today.  Discharged ambulatory

## 2014-03-13 NOTE — Progress Notes (Signed)
Calcium to low x-geva held

## 2014-03-13 NOTE — Assessment & Plan Note (Signed)
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 From 07/31/2012 until 11/12/2012 patient received 6 cycles of carboplatin and Taxol because of enlargement of pulmonary nodules. He was placed on maintenance Tarceva on 12/10/2012 which was stopped after repeat CT scan on 05/01/2013 showed progression of disease. Patient not a candidate for SBRT. Therefore, he started Nivolumab on 07/31/2013, tolerating well. Continue with pre-chemo labs.  CT CAP for restaging in 2 weeks followed by a return appointment to review data and to learn of future therapeutic options.

## 2014-03-17 ENCOUNTER — Encounter (HOSPITAL_COMMUNITY): Payer: Self-pay | Admitting: Hematology & Oncology

## 2014-03-17 DIAGNOSIS — R319 Hematuria, unspecified: Secondary | ICD-10-CM | POA: Insufficient documentation

## 2014-03-17 NOTE — Assessment & Plan Note (Signed)
He has had several episodes of mild hematuria. His blood counts are stable today. His kidney function is unchanged. I have recommended a urine analysis to rule out the possibility of infection. I have advised him I will let him know the results of his study when it becomes available. If he needs an antibiotic we will call this in for him. I have also encouraged him that if his symptoms recur to please call us. We can consider urology referral if necessary.

## 2014-03-17 NOTE — Assessment & Plan Note (Signed)
As an 81 year old male with a history of laryngeal carcinoma. He is now been diagnosed with stage IV squamous cell carcinoma felt to be either lung primary versus recurrent laryngeal carcinoma. He is currently on Nivolumab and overall has tolerated it well. After his last cycle he had multiple symptoms and it is uncertain whether is from chemotherapy. Wishes to proceed with treatment today and I have advised he and his wife to call us should he develop any additional symptoms similar to after his last cycle. He will need reimaging soon and we addressed this today. We will see him back again in 2 weeks with repeat laboratory studies and his next cycle of chemotherapy.

## 2014-03-21 ENCOUNTER — Encounter (HOSPITAL_COMMUNITY): Payer: Managed Care, Other (non HMO) | Attending: Oncology | Admitting: Oncology

## 2014-03-21 ENCOUNTER — Other Ambulatory Visit: Payer: Self-pay | Admitting: Family Medicine

## 2014-03-21 ENCOUNTER — Encounter (HOSPITAL_COMMUNITY): Payer: Self-pay | Admitting: Oncology

## 2014-03-21 VITALS — BP 122/46 | HR 79 | Temp 97.9°F | Resp 18

## 2014-03-21 DIAGNOSIS — L299 Pruritus, unspecified: Secondary | ICD-10-CM | POA: Diagnosis not present

## 2014-03-21 DIAGNOSIS — L298 Other pruritus: Secondary | ICD-10-CM

## 2014-03-21 DIAGNOSIS — C801 Malignant (primary) neoplasm, unspecified: Secondary | ICD-10-CM | POA: Insufficient documentation

## 2014-03-21 DIAGNOSIS — R05 Cough: Secondary | ICD-10-CM | POA: Diagnosis not present

## 2014-03-21 DIAGNOSIS — R058 Other specified cough: Secondary | ICD-10-CM

## 2014-03-21 DIAGNOSIS — T50905A Adverse effect of unspecified drugs, medicaments and biological substances, initial encounter: Secondary | ICD-10-CM

## 2014-03-21 DIAGNOSIS — R21 Rash and other nonspecific skin eruption: Secondary | ICD-10-CM

## 2014-03-21 DIAGNOSIS — Z85118 Personal history of other malignant neoplasm of bronchus and lung: Secondary | ICD-10-CM | POA: Insufficient documentation

## 2014-03-21 DIAGNOSIS — R918 Other nonspecific abnormal finding of lung field: Secondary | ICD-10-CM | POA: Insufficient documentation

## 2014-03-21 MED ORDER — AMOXICILLIN-POT CLAVULANATE 875-125 MG PO TABS: 1.0000 | ORAL_TABLET | Freq: Two times a day (BID) | ORAL | Status: DC

## 2014-03-21 MED ORDER — PREDNISONE 20 MG PO TABS: 20.0000 mg | ORAL_TABLET | Freq: Every day | ORAL | Status: DC

## 2014-03-21 NOTE — Progress Notes (Signed)
Jeffrey [name] is seen as a work-in today.  He did not give [place] a warning that he was coming today.  He reports a rash that is pruritic.  He notes that he has been scratching at it.  As a results, he has excoriations on his back, abdomen, chest, and lower legs.  He also has associated dry skin.  He notes that this happened before after chemotherapy and resolved on its own.  Concern for drug reaction is noted and given he is on immunotherapy, an Rx for 20 mg of Prednisone x 5 days as a pulse was prescribed.  He is also to use Benadryl symptomatically.   He also reports nausea with vomiting.  This is unlikely from [facility] as it is an immunotherapy and not a chemotherapeutic agent.  He is encouraged to continue with antiemetics, particularly since it is improved.  He notes a a cough productive of yellow-green sputum.  He shows me his tracheostomy which does display some green sputum lateral to the opening against the trachea.  I will therefore order Augmentin x 7 days.  He will return as planned after CT imaging for follow-up and discussion regarding further treatment.  Patient and plan discussed with Dr. Ancil Linsey and she is in agreement with the aforementioned.   Jeffrey [name] 03/21/2014 4:03 PM

## 2014-03-21 NOTE — Patient Instructions (Signed)
.  [place] at [facility] Discharge Instructions  RECOMMENDATIONS MADE BY THE CONSULTANT AND ANY TEST RESULTS WILL BE SENT TO YOUR REFERRING PHYSICIAn Prednisone and antibiotic prescriptions given  Use benadryl for itching  Call us if not better Monday or Tuesday  Thank you for choosing [place] at [facility] to provide your oncology and hematology care.  To afford each patient quality time with our provider, please arrive at least 15 minutes before your scheduled appointment time.    You need to re-schedule your appointment should you arrive 10 or more minutes late.  We strive to give you quality time with our providers, and arriving late affects you and other patients whose appointments are after yours.  Also, if you no show three or more times for appointments you may be dismissed from the clinic at the providers discretion.     Again, thank you for choosing [facility].  Our hope is that these requests will decrease the amount of time that you wait before being seen by our physicians.       _____________________________________________________________  Should you have questions after your visit to [facility], please contact our office at [phone] between the hours of 8:30 a.m. and 4:30 p.m.  Voicemails left after 4:30 p.m. will not be returned until the following business day.  For prescription refill requests, have your pharmacy contact our office.

## 2014-03-21 NOTE — Telephone Encounter (Signed)
Ok plus 5 monthly ref 

## 2014-03-23 ENCOUNTER — Other Ambulatory Visit: Payer: Self-pay | Admitting: Family Medicine

## 2014-03-24 NOTE — Telephone Encounter (Signed)
Ok plus 3 monthly ref

## 2014-03-24 NOTE — Telephone Encounter (Signed)
Last seen 02/15/13

## 2014-03-24 NOTE — Telephone Encounter (Signed)
Has not had BW ordered from [place] since 2014.

## 2014-03-25 ENCOUNTER — Encounter (HOSPITAL_COMMUNITY): Payer: Self-pay

## 2014-03-25 ENCOUNTER — Ambulatory Visit (HOSPITAL_COMMUNITY)
Admission: RE | Admit: 2014-03-25 | Discharge: 2014-03-25 | Disposition: A | Discharge status: Home / Self Care | Payer: Managed Care, Other (non HMO) | Source: Ambulatory Visit | Attending: Oncology | Admitting: Oncology

## 2014-03-25 DIAGNOSIS — C7951 Secondary malignant neoplasm of bone: Secondary | ICD-10-CM | POA: Diagnosis not present

## 2014-03-25 DIAGNOSIS — Z08 Encounter for follow-up examination after completed treatment for malignant neoplasm: Secondary | ICD-10-CM | POA: Insufficient documentation

## 2014-03-25 DIAGNOSIS — C799 Secondary malignant neoplasm of unspecified site: Secondary | ICD-10-CM

## 2014-03-25 DIAGNOSIS — C801 Malignant (primary) neoplasm, unspecified: Secondary | ICD-10-CM | POA: Insufficient documentation

## 2014-03-25 DIAGNOSIS — N1330 Unspecified hydronephrosis: Secondary | ICD-10-CM | POA: Insufficient documentation

## 2014-03-25 MED ORDER — IOHEXOL 300 MG/ML  SOLN
80.0000 mL | Freq: Once | INTRAMUSCULAR | Status: AC | PRN
  Administered 2014-03-25: 80 mL via INTRAVENOUS

## 2014-03-27 ENCOUNTER — Encounter (HOSPITAL_COMMUNITY): Payer: Managed Care, Other (non HMO)

## 2014-03-27 ENCOUNTER — Encounter (HOSPITAL_COMMUNITY): Payer: Self-pay | Admitting: Hematology & Oncology

## 2014-03-27 ENCOUNTER — Ambulatory Visit (HOSPITAL_COMMUNITY): Payer: Managed Care, Other (non HMO)

## 2014-03-27 ENCOUNTER — Encounter (HOSPITAL_BASED_OUTPATIENT_CLINIC_OR_DEPARTMENT_OTHER): Payer: Managed Care, Other (non HMO) | Admitting: Hematology & Oncology

## 2014-03-27 DIAGNOSIS — D649 Anemia, unspecified: Secondary | ICD-10-CM

## 2014-03-27 DIAGNOSIS — C799 Secondary malignant neoplasm of unspecified site: Secondary | ICD-10-CM

## 2014-03-27 DIAGNOSIS — C7801 Secondary malignant neoplasm of right lung: Secondary | ICD-10-CM

## 2014-03-27 DIAGNOSIS — Z85118 Personal history of other malignant neoplasm of bronchus and lung: Secondary | ICD-10-CM | POA: Diagnosis not present

## 2014-03-27 DIAGNOSIS — R918 Other nonspecific abnormal finding of lung field: Secondary | ICD-10-CM | POA: Diagnosis present

## 2014-03-27 DIAGNOSIS — C3412 Malignant neoplasm of upper lobe, left bronchus or lung: Secondary | ICD-10-CM | POA: Diagnosis not present

## 2014-03-27 DIAGNOSIS — C7802 Secondary malignant neoplasm of left lung: Secondary | ICD-10-CM

## 2014-03-27 DIAGNOSIS — Z8521 Personal history of malignant neoplasm of larynx: Secondary | ICD-10-CM

## 2014-03-27 DIAGNOSIS — G939 Disorder of brain, unspecified: Secondary | ICD-10-CM | POA: Diagnosis not present

## 2014-03-27 DIAGNOSIS — C801 Malignant (primary) neoplasm, unspecified: Secondary | ICD-10-CM | POA: Diagnosis present

## 2014-03-27 LAB — CBC WITH DIFFERENTIAL/PLATELET
BASOS ABS: 0 10*3/uL (ref 0.0–0.1)
BASOS PCT: 0 % (ref 0–1)
EOS PCT: 1 % (ref 0–5)
Eosinophils Absolute: 0.1 10*3/uL (ref 0.0–0.7)
HCT: 23.7 % — ABNORMAL LOW (ref 39.0–52.0)
Hemoglobin: 7.7 g/dL — ABNORMAL LOW (ref 13.0–17.0)
LYMPHS ABS: 0.4 10*3/uL — AB (ref 0.7–4.0)
Lymphocytes Relative: 6 % — ABNORMAL LOW (ref 12–46)
MCH: 29.2 pg (ref 26.0–34.0)
MCHC: 32.5 g/dL (ref 30.0–36.0)
MCV: 89.8 fL (ref 78.0–100.0)
MONO ABS: 0.4 10*3/uL (ref 0.1–1.0)
Monocytes Relative: 6 % (ref 3–12)
NEUTROS ABS: 5.6 10*3/uL (ref 1.7–7.7)
NEUTROS PCT: 86 % — AB (ref 43–77)
Platelets: 166 10*3/uL (ref 150–400)
RBC: 2.64 MIL/uL — ABNORMAL LOW (ref 4.22–5.81)
RDW: 14.9 % (ref 11.5–15.5)
WBC: 6.4 10*3/uL (ref 4.0–10.5)

## 2014-03-27 LAB — COMPREHENSIVE METABOLIC PANEL
ALBUMIN: 2 g/dL — AB (ref 3.5–5.2)
ALT: 7 U/L (ref 0–53)
AST: 7 U/L (ref 0–37)
Alkaline Phosphatase: 33 U/L — ABNORMAL LOW (ref 39–117)
Anion gap: 3 — ABNORMAL LOW (ref 5–15)
BILIRUBIN TOTAL: 0.2 mg/dL — AB (ref 0.3–1.2)
BUN: 24 mg/dL — AB (ref 6–23)
CHLORIDE: 121 mmol/L — AB (ref 96–112)
CO2: 17 mmol/L — ABNORMAL LOW (ref 19–32)
CREATININE: 1.05 mg/dL (ref 0.50–1.35)
Calcium: 5.2 mg/dL — CL (ref 8.4–10.5)
GFR calc Af Amer: 76 mL/min — ABNORMAL LOW (ref >90)
GFR, EST NON AFRICAN AMERICAN: 66 mL/min — AB (ref >90)
GLUCOSE: 68 mg/dL — AB (ref 70–99)
Potassium: 3.2 mmol/L — ABNORMAL LOW (ref 3.5–5.1)
SODIUM: 141 mmol/L (ref 135–145)
TOTAL PROTEIN: 4.5 g/dL — AB (ref 6.0–8.3)

## 2014-03-27 MED ORDER — CALCIUM-VITAMIN D3 600-500 MG-UNIT PO CAPS: 1.0000 | ORAL_CAPSULE | Freq: Three times a day (TID) | ORAL | Status: AC

## 2014-03-27 NOTE — Assessment & Plan Note (Signed)
81 year old male with a performance status of 1 who presents for follow-up of squamous cell carcinoma, stage IV disease. He is on single agent nivolumab. Imaging studies are stable to slightly improved. He has had however multiple toxicities associated with treatment that seemed to be progressing. His include nausea, vomiting, excessive fatigue 3-4 days after treatment, and recently a severe rash. He was given steroids with his rash. It is now resolved.  He also has significant anemia and I recommended transfusion. We will arrange this.  In addition I would hold therapy today. I discussed this with the patient and his wife and they understand the recommendations. Prior to his next treatment we will give him antiemetics. In regards to dose reducing nivolumab, there are no clear guidelines. I can discuss this with one of my partners.  Advised the patient and his wife he is any difficulties prior to his next follow-up to let us know. He looks good today however, he has is pleased with the results of his current imaging. I discussed with Tom, our PA, the parietal bone lesion and was advised this was discussed with neurosurgery. We will see the patient back in 2 weeks.

## 2014-03-27 NOTE — Patient Instructions (Signed)
[place] at [facility]  Discharge Instructions:  You saw Dr Whitney Muse today.  Follow up with the doctor in 2 weeks.  Chemotherapy in 2 weeks.  Please call the clinic if you have any questions or concerns before then   _______________________________________________________________  Thank you for choosing [place] at [facility] to provide your oncology and hematology care.  To afford each patient quality time with our providers, please arrive at least 15 minutes before your scheduled appointment.  You need to re-schedule your appointment if you arrive 10 or more minutes late.  We strive to give you quality time with our providers, and arriving late affects you and other patients whose appointments are after yours.  Also, if you no show three or more times for appointments you may be dismissed from the clinic.  Again, thank you for choosing [place] at [facility] hope is that these requests will allow you access to exceptional care and in a timely manner. _______________________________________________________________  If you have questions after your visit, please contact our office at [phone] between the hours of 8:30 a.m. and 5:00 p.m. Voicemails left after 4:30 p.m. will not be returned until the following business day. _______________________________________________________________  For prescription refill requests, have your pharmacy contact our office. _______________________________________________________________  Recommendations made by the consultant and any test results will be sent to your referring physician. _______________________________________________________________

## 2014-03-27 NOTE — Progress Notes (Signed)
CRITICAL VALUE ALERT Critical value received:  Calcium 5.2 Date of notification:  03/27/14 Time of notification: 5015 Critical value read back:  Yes.   Nurse who received alert:  Isidoro Donning RN Dr. Whitney Muse notified immediately. Patient not taking Calcium as he should be. Rx called in for Caclium + Vit D 600/500. Patient to take 1 tablet three times a day. Patient's wife and pt understood these instructions and Miro said he promised he would take the pills. I told patient's wife that if the Rx cost too much then she could just not pick it up but to continue with the OSCAL and start taking 1 tablet three times a day. She said ok. Patient's wife said she thought he would take a "Rx" pill better than an OTC and that is the reason for calling in a Rx for Calcium.

## 2014-03-27 NOTE — Progress Notes (Signed)
Jeffrey Battiest, MD [place] [address]    DIAGNOSIS: Metastatic cancer   Staging form: Lung, AJCC 7th Edition     Clinical: Stage IV (T1, N0, M1a) - Unsigned   SUMMARY OF ONCOLOGIC HISTORY:   Metastatic cancer   02/17/2011 Imaging PET scan- B/L hypermetabolic pulmonary nodules (3 in total) are most consistent with metastatic disease. No evidence of mediastinal nodal metastasis.  No evidence of metastasis in the abdomen or pelvis.   06/19/2012 Imaging CT scan of chest- Slight enlargement of pulmonary nodules/metastasis.    07/31/2012 - 11/12/2012 Chemotherapy Carboplatin/Paclitaxel x 6 cycles   11/26/2012 Imaging CT CAP- Similar size of a R apical pulm nodule.  Similar to improved left lower lobe nodularity, favored to be post infectious or inflammatory. Similar nodularity in the right middle lobe. No evidence of new or progressive disease. Similar borderline adeno    12/10/2012 - 05/07/2013 Chemotherapy Tarceva 150 mg daily   05/03/2013 Progression CT CAP- Interval increase in size of right upper lobe adjacent pulmonary nodules is highly concerning for progression of lung cancer recurrence.   07/31/2013 -  Chemotherapy Nivolumab   10/21/2013 Imaging CT CAP- The dominant right apical mass is slightly increased in size from previous exam. The adjacent nodule is stable.   10/31/2013 Imaging CT of head- Intermediate density subdural collection/hematoma along the convexity on the right, maximal thickness 7 mm. 2 mm of right-to-left shift. No hyper acute hemorrhage.  Lytic lesion right posterior frontal calvarium, new since the previous study.   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 Widespread underlying dural thickening and enhancement in the R hemisphere suspicious for meningeal involvement.   12/24/2013 Imaging CT CAP- No clear enlargement of right apical mass. Stable right  middle lobe nodule. Mild mediastinal lymphadenopathy.   12/26/2013 Procedure Spinal CSF testing- negative for malignancy.   01/14/2014 Imaging Skeletal survey- Lytic lesion noted in the right frontoparietal region as noted on recent head CT of 10/31/2013. This is consist with metastatic disease. No other focal bony lesions noted.    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 He also has a history of laryngeal carcinoma. He is currently on Nivolumab. He states he has done well with therapy until this last treatment when he developed fever. In addition he reports hematuria. He states it lasted several days. But has now resolved. Also reported a decrease in his appetite. He had some nausea. All of these symptoms have now resolved. His ongoing shoulder pain, but this is chronic and unchanged.  He describes his mood is good. He has no other major complaints today.  MEDICAL HISTORY: Past Medical History  Diagnosis Date  . Tracheostomy in place   . Cancer 2010    laryngeal  . Lung cancer 2010  . Pneumonia 2014  . Dysrhythmia     takes Coreg and Lisinopril daily  . Shortness of breath     with exertion  . Enlarged prostate     takes flomax  . Urinary urgency   . Gastric ulcer     hx of  . Hx of transfusion of packed red blood cells     no abnormal reaction noted by  patient  . Umbilical hernia   . Metastatic cancer 07/24/2012  . Hyperlipidemia   . ED (erectile dysfunction)   . Insomnia   . CHF (congestive heart failure)   . 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     has No Known Allergies.  Jeffrey [name] had no medications administered during this visit.  SURGICAL HISTORY: Past Surgical History  Procedure Laterality Date  . Tracheostomy  2010  . Lung removal, partial  2010    left side  . Layngectomy for laryngeal cancer in2010; left upper lobectomy for lung cancer, 2010.    . Back surgery      thinks it was done in 2012  . Esophagogastroduodenoscopy    . Colonoscopy    . Portacath placement Bilateral 07/12/2012    Procedure: INSERTION PORT-A-CATH;  Surgeon: [name], MD;  Location: [facility];  Service: Thoracic;  Laterality: Bilateral;    SOCIAL HISTORY: History   Social History  . Marital Status: Married    Spouse Name: N/A  . Number of Children: 1  . Years of Education: N/A   Occupational History  . Not on file.   Social History Main Topics  . Smoking status: Current Every Day Smoker -- 1.00 packs/day for 70 years    Types: Cigarettes  . Smokeless tobacco: Never Used  . Alcohol Use: No  . Drug Use: No  . Sexual Activity: Not Currently   Other Topics Concern  . Not on file   Social History Narrative    FAMILY HISTORY: Family History  Problem Relation Age of Onset  . Diabetes Father   . Heart disease Father     Review of Systems  Constitutional: Positive for malaise/fatigue.  HENT: Negative.   Respiratory: Negative.   Cardiovascular: Negative.   Gastrointestinal: Positive for nausea and vomiting.  Genitourinary: Negative.   Musculoskeletal: Positive for joint pain.  Skin: Positive for rash.  Neurological: Positive for weakness. Negative for dizziness, tingling, tremors, sensory change, speech change, focal weakness, seizures and  loss of consciousness.  Psychiatric/Behavioral: Negative.     PHYSICAL EXAMINATION  ECOG PERFORMANCE STATUS: 1 - Symptomatic but completely ambulatory  There were no vitals filed for this visit.  Physical Exam  Constitutional: He is oriented to person, place, and time.  Thin pleasant male, tracheostomy site  HENT:  Head: Normocephalic and atraumatic.  Nose: Nose normal.  Mouth/Throat: Oropharynx is clear and moist. No oropharyngeal exudate.  Eyes: Conjunctivae and EOM are normal. Pupils are equal, round, and reactive to light. Right eye exhibits no discharge. Left eye exhibits no discharge. No scleral icterus.  Neck: Normal range of motion. Neck supple. No tracheal deviation present. No thyromegaly present.  Cardiovascular: Normal rate, regular rhythm and normal heart sounds.  Exam reveals no gallop and no friction rub.   No murmur heard. Pulmonary/Chest: Effort normal. He has no wheezes. He has no rales.  Occasional coarse BS that clear with cough  Abdominal: Soft. Bowel sounds are normal. He exhibits no distension and no mass. There is no tenderness. There is no rebound and no guarding.  Musculoskeletal: Normal range of motion. He exhibits no edema.  Lymphadenopathy:    He has no cervical adenopathy.  Neurological: He is alert and oriented to person, place, and time. He has normal reflexes. No cranial nerve deficit. Gait normal. Coordination normal.  Skin: Skin is warm and dry. No rash noted.  Psychiatric: Mood, memory, affect and judgment normal.  Nursing note and vitals reviewed.   LABORATORY DATA:  CBC    Component Value Date/Time   WBC 6.4 03/27/2014 1120   RBC 2.64* 03/27/2014 1120   HGB 7.7* 03/27/2014 1120   HCT 23.7* 03/27/2014 1120   PLT 166 03/27/2014 1120   MCV 89.8 03/27/2014 1120   MCH 29.2 03/27/2014 1120   MCHC 32.5 03/27/2014 1120   RDW 14.9 03/27/2014 1120   LYMPHSABS 0.4* 03/27/2014 1120   MONOABS 0.4 03/27/2014 1120   EOSABS 0.1 03/27/2014 1120    BASOSABS 0.0 03/27/2014 1120   CMP     Component Value Date/Time   NA 141 03/27/2014 1120   K 3.2* 03/27/2014 1120   CL 121* 03/27/2014 1120   CO2 17* 03/27/2014 1120   GLUCOSE 68* 03/27/2014 1120   BUN 24* 03/27/2014 1120   CREATININE 1.05 03/27/2014 1120   CREATININE 1.46* 11/12/2012 0839   CALCIUM 5.2* 03/27/2014 1120   CALCIUM 8.5 01/26/2012 1312   PROT 4.5* 03/27/2014 1120   ALBUMIN 2.0* 03/27/2014 1120   AST 7 03/27/2014 1120   ALT 7 03/27/2014 1120   ALKPHOS 33* 03/27/2014 1120   BILITOT 0.2* 03/27/2014 1120   GFRNONAA 66* 03/27/2014 1120   GFRAA 76* 03/27/2014 1120    RADIOLOGY:  03/25/2014 CLINICAL DATA: Restaging metastatic squamous cell carcinoma. Currently on chemotherapy.  EXAM: CT HEAD WITHOUT AND WITH CONTRAST  CT CHEST, ABDOMEN AND PELVIS WITH CONTRAST  IMPRESSION: 1. Large lytic right parietal bone lesion with underlying probable dural tumor. This appears slightly improved when compared to the prior MRI but may be modality related. 2. No intracranial enhancing metastatic lesions. 3. Stable right apical lung lesion. 4. Stable small mediastinal lymph nodes. No mass or adenopathy. 5. Severe emphysematous changes but no pulmonary metastatic lesions. 6. No findings for abdominal/pelvic metastatic disease. 7. Stable marked hydroureteronephrosis bilaterally with a markedly distended trabeculated bladder.   Electronically Signed  By: Marijo Sanes M.D.  On: 03/25/2014 12:07  ASSESSMENT and THERAPY PLAN:    Metastatic cancer 81 year old male with a performance status of 1 who presents for follow-up of squamous cell carcinoma, stage IV disease. He is on single agent nivolumab. Imaging studies are stable to slightly improved. He has had however multiple toxicities associated with treatment that seemed to be progressing. His include nausea, vomiting, excessive fatigue 3-4 days after treatment, and recently a severe rash. He was given steroids with  his rash. It is now resolved.  He also has significant anemia and I recommended transfusion. We will arrange this.  In addition I would hold therapy today. I discussed this with the patient and his wife and they understand the recommendations. Prior to his next treatment we will give him antiemetics. In regards to dose reducing nivolumab, there are no clear guidelines. I can discuss this with one of my partners.  Advised the patient and his wife he is any difficulties prior to his next follow-up to let us know. He looks good today however, he has is pleased with the results of his current imaging. I discussed with Tom, our PA, the parietal bone lesion and was advised this was discussed  with neurosurgery. We will see the patient back in 2 weeks.   All questions were answered. The patient knows to call the clinic with any problems, questions or concerns. We can certainly see the patient much sooner if necessary.  Molli Hazard 03/27/2014

## 2014-03-28 ENCOUNTER — Encounter (HOSPITAL_BASED_OUTPATIENT_CLINIC_OR_DEPARTMENT_OTHER): Payer: Managed Care, Other (non HMO)

## 2014-03-28 DIAGNOSIS — C7801 Secondary malignant neoplasm of right lung: Secondary | ICD-10-CM

## 2014-03-28 DIAGNOSIS — C7802 Secondary malignant neoplasm of left lung: Secondary | ICD-10-CM

## 2014-03-28 DIAGNOSIS — D630 Anemia in neoplastic disease: Secondary | ICD-10-CM

## 2014-03-28 DIAGNOSIS — C3412 Malignant neoplasm of upper lobe, left bronchus or lung: Secondary | ICD-10-CM

## 2014-03-28 DIAGNOSIS — Z85118 Personal history of other malignant neoplasm of bronchus and lung: Secondary | ICD-10-CM | POA: Diagnosis not present

## 2014-03-28 LAB — PREPARE RBC (CROSSMATCH)

## 2014-03-28 LAB — ABO/RH: ABO/RH(D): AB POS

## 2014-03-28 NOTE — Progress Notes (Signed)
Lab draw

## 2014-03-28 NOTE — Addendum Note (Signed)
Addended by: Mellissa Kohut on: 03/28/2014 11:24 AM   Modules accepted: Orders

## 2014-03-31 ENCOUNTER — Encounter (HOSPITAL_BASED_OUTPATIENT_CLINIC_OR_DEPARTMENT_OTHER): Payer: Managed Care, Other (non HMO)

## 2014-03-31 ENCOUNTER — Encounter (HOSPITAL_BASED_OUTPATIENT_CLINIC_OR_DEPARTMENT_OTHER): Payer: Managed Care, Other (non HMO) | Admitting: Oncology

## 2014-03-31 ENCOUNTER — Ambulatory Visit (HOSPITAL_COMMUNITY)
Admission: RE | Admit: 2014-03-31 | Discharge: 2014-03-31 | Disposition: A | Discharge status: Home / Self Care | Payer: Managed Care, Other (non HMO) | Source: Ambulatory Visit | Attending: Oncology | Admitting: Oncology

## 2014-03-31 ENCOUNTER — Encounter (HOSPITAL_COMMUNITY): Payer: Self-pay

## 2014-03-31 DIAGNOSIS — M25512 Pain in left shoulder: Secondary | ICD-10-CM | POA: Insufficient documentation

## 2014-03-31 DIAGNOSIS — L988 Other specified disorders of the skin and subcutaneous tissue: Secondary | ICD-10-CM

## 2014-03-31 DIAGNOSIS — D649 Anemia, unspecified: Secondary | ICD-10-CM

## 2014-03-31 DIAGNOSIS — D630 Anemia in neoplastic disease: Secondary | ICD-10-CM

## 2014-03-31 DIAGNOSIS — M899 Disorder of bone, unspecified: Secondary | ICD-10-CM | POA: Diagnosis not present

## 2014-03-31 DIAGNOSIS — C799 Secondary malignant neoplasm of unspecified site: Secondary | ICD-10-CM

## 2014-03-31 DIAGNOSIS — Z85118 Personal history of other malignant neoplasm of bronchus and lung: Secondary | ICD-10-CM | POA: Diagnosis not present

## 2014-03-31 LAB — OCCULT BLOOD X 1 CARD TO LAB, STOOL
Fecal Occult Bld: NEGATIVE
Fecal Occult Bld: NEGATIVE
Fecal Occult Bld: NEGATIVE

## 2014-03-31 MED ORDER — HEPARIN SOD (PORK) LOCK FLUSH 100 UNIT/ML IV SOLN: 250.0000 [IU] | INTRAVENOUS | Status: DC | PRN

## 2014-03-31 MED ORDER — HEPARIN SOD (PORK) LOCK FLUSH 100 UNIT/ML IV SOLN
500.0000 [IU] | Freq: Once | INTRAVENOUS | Status: AC
  Administered 2014-03-31: 500 [IU] via INTRAVENOUS

## 2014-03-31 MED ORDER — ACETAMINOPHEN 325 MG PO TABS
ORAL_TABLET | ORAL | Status: AC
  Filled 2014-03-31: qty 2

## 2014-03-31 MED ORDER — SODIUM CHLORIDE 0.9 % IV SOLN
250.0000 mL | Freq: Once | INTRAVENOUS | Status: AC
  Administered 2014-03-31: 250 mL via INTRAVENOUS

## 2014-03-31 MED ORDER — DIPHENHYDRAMINE HCL 25 MG PO CAPS
25.0000 mg | ORAL_CAPSULE | Freq: Once | ORAL | Status: AC
  Administered 2014-03-31: 25 mg via ORAL

## 2014-03-31 MED ORDER — DIPHENHYDRAMINE HCL 25 MG PO CAPS
ORAL_CAPSULE | ORAL | Status: AC
  Filled 2014-03-31: qty 1

## 2014-03-31 MED ORDER — SODIUM CHLORIDE 0.9 % IJ SOLN: 10.0000 mL | INTRAMUSCULAR | Status: DC | PRN

## 2014-03-31 MED ORDER — ACETAMINOPHEN 325 MG PO TABS
650.0000 mg | ORAL_TABLET | Freq: Once | ORAL | Status: AC
  Administered 2014-03-31: 650 mg via ORAL

## 2014-03-31 MED ORDER — HEPARIN SOD (PORK) LOCK FLUSH 100 UNIT/ML IV SOLN
INTRAVENOUS | Status: AC
  Filled 2014-03-31: qty 5

## 2014-03-31 NOTE — Progress Notes (Signed)
Jeffrey [name] Buchanan Tolerated 2 units of blood today.  Discharged ambulatory

## 2014-03-31 NOTE — Patient Instructions (Signed)
[place] at [facility]  Discharge Instructions:  You received 2 units of blood today.  Please go by x-ray to have your left shoulder x-rayed.  Follow up as scheduled.  Call the clinic if you have any questions or concerns _______________________________________________________________  Thank you for choosing [place] at [facility] to provide your oncology and hematology care.  To afford each patient quality time with our providers, please arrive at least 15 minutes before your scheduled appointment.  You need to re-schedule your appointment if you arrive 10 or more minutes late.  We strive to give you quality time with our providers, and arriving late affects you and other patients whose appointments are after yours.  Also, if you no show three or more times for appointments you may be dismissed from the clinic.  Again, thank you for choosing [place] at [facility] hope is that these requests will allow you access to exceptional care and in a timely manner. _______________________________________________________________  If you have questions after your visit, please contact our office at [phone] between the hours of 8:30 a.m. and 5:00 p.m. Voicemails left after 4:30 p.m. will not be returned until the following business day. _______________________________________________________________  For prescription refill requests, have your pharmacy contact our office. _______________________________________________________________  Recommendations made by the consultant and any test results will be sent to your referring physician. _______________________________________________________________

## 2014-03-31 NOTE — Progress Notes (Signed)
Patient is seen as a work-in today as he get a blood transfusion.  He notes that a left scapular enlargment has increased over the weekend.  It is about the size of my palm measuring 5 cm in size.  It is hard and tender to palpation.  I have reviewed the scans with Dr. Thornton Papas in detail.  He notes a lesion arising from the bone into the soft tissue.  This is not called in the CT scan.  Therefore, he will discuss the case with the reading radiologist.    The patient is otherwise doing well from an oncology standpoint.   Therefore, I will get some plain films and if positive for osseous metastasis, the patient will need biopsy to prove progression of disease and help with future planning of treatment.  As a side notes, he has a soft 2 cm mobile lesion on the right thoracic back area that is likely a lipoma.   Patient and plan discussed with Dr. Ancil Linsey and she is in agreement with the aforementioned.   Robertt Buda 03/31/2014 10:01 AM

## 2014-04-01 ENCOUNTER — Other Ambulatory Visit (HOSPITAL_COMMUNITY): Payer: Self-pay | Admitting: Oncology

## 2014-04-01 DIAGNOSIS — C799 Secondary malignant neoplasm of unspecified site: Secondary | ICD-10-CM

## 2014-04-01 LAB — TYPE AND SCREEN
ABO/RH(D): AB POS
ANTIBODY SCREEN: NEGATIVE
UNIT DIVISION: 0
Unit division: 0

## 2014-04-04 ENCOUNTER — Other Ambulatory Visit: Payer: Self-pay | Admitting: Radiology

## 2014-04-07 ENCOUNTER — Ambulatory Visit (HOSPITAL_COMMUNITY)
Admission: RE | Admit: 2014-04-07 | Discharge: 2014-04-07 | Disposition: A | Discharge status: Home / Self Care | Payer: Managed Care, Other (non HMO) | Source: Ambulatory Visit | Attending: Oncology | Admitting: Oncology

## 2014-04-07 ENCOUNTER — Encounter (HOSPITAL_COMMUNITY): Payer: Self-pay

## 2014-04-07 DIAGNOSIS — Z902 Acquired absence of lung [part of]: Secondary | ICD-10-CM | POA: Insufficient documentation

## 2014-04-07 DIAGNOSIS — Z85118 Personal history of other malignant neoplasm of bronchus and lung: Secondary | ICD-10-CM | POA: Insufficient documentation

## 2014-04-07 DIAGNOSIS — I509 Heart failure, unspecified: Secondary | ICD-10-CM | POA: Diagnosis not present

## 2014-04-07 DIAGNOSIS — F17210 Nicotine dependence, cigarettes, uncomplicated: Secondary | ICD-10-CM | POA: Insufficient documentation

## 2014-04-07 DIAGNOSIS — Z79899 Other long term (current) drug therapy: Secondary | ICD-10-CM | POA: Insufficient documentation

## 2014-04-07 DIAGNOSIS — Z8521 Personal history of malignant neoplasm of larynx: Secondary | ICD-10-CM | POA: Insufficient documentation

## 2014-04-07 DIAGNOSIS — E785 Hyperlipidemia, unspecified: Secondary | ICD-10-CM | POA: Insufficient documentation

## 2014-04-07 DIAGNOSIS — R2232 Localized swelling, mass and lump, left upper limb: Secondary | ICD-10-CM | POA: Diagnosis present

## 2014-04-07 DIAGNOSIS — Z930 Tracheostomy status: Secondary | ICD-10-CM | POA: Diagnosis not present

## 2014-04-07 DIAGNOSIS — C7989 Secondary malignant neoplasm of other specified sites: Secondary | ICD-10-CM | POA: Diagnosis not present

## 2014-04-07 DIAGNOSIS — C799 Secondary malignant neoplasm of unspecified site: Secondary | ICD-10-CM

## 2014-04-07 LAB — CBC
HCT: 37.1 % — ABNORMAL LOW (ref 39.0–52.0)
Hemoglobin: 12.1 g/dL — ABNORMAL LOW (ref 13.0–17.0)
MCH: 28.6 pg (ref 26.0–34.0)
MCHC: 32.6 g/dL (ref 30.0–36.0)
MCV: 87.7 fL (ref 78.0–100.0)
PLATELETS: 176 10*3/uL (ref 150–400)
RBC: 4.23 MIL/uL (ref 4.22–5.81)
RDW: 15 % (ref 11.5–15.5)
WBC: 7.3 10*3/uL (ref 4.0–10.5)

## 2014-04-07 LAB — APTT: aPTT: 33 seconds (ref 24–37)

## 2014-04-07 LAB — PROTIME-INR
INR: 1.05 (ref 0.00–1.49)
PROTHROMBIN TIME: 13.8 s (ref 11.6–15.2)

## 2014-04-07 MED ORDER — MIDAZOLAM HCL 2 MG/2ML IJ SOLN
INTRAMUSCULAR | Status: AC
  Filled 2014-04-07: qty 2

## 2014-04-07 MED ORDER — FENTANYL CITRATE 0.05 MG/ML IJ SOLN
INTRAMUSCULAR | Status: AC | PRN
  Administered 2014-04-07: 25 ug via INTRAVENOUS

## 2014-04-07 MED ORDER — MIDAZOLAM HCL 2 MG/2ML IJ SOLN
INTRAMUSCULAR | Status: AC | PRN
  Administered 2014-04-07: 0.5 mg via INTRAVENOUS

## 2014-04-07 MED ORDER — SODIUM CHLORIDE 0.9 % IV SOLN
INTRAVENOUS | Status: DC
  Administered 2014-04-07: 09:00:00 via INTRAVENOUS

## 2014-04-07 MED ORDER — LIDOCAINE HCL (PF) 1 % IJ SOLN
INTRAMUSCULAR | Status: AC
  Filled 2014-04-07: qty 10

## 2014-04-07 MED ORDER — FENTANYL CITRATE 0.05 MG/ML IJ SOLN
INTRAMUSCULAR | Status: AC
  Filled 2014-04-07: qty 2

## 2014-04-07 NOTE — H&P (Signed)
Chief Complaint: "I'm here for a biopsy" Referring Physician:Kefalas, PA-C HPI: Jeffrey [name] is an 81 y.o. male with hx of lung cancer. He is now found to have palpable mass in left scapular region. He is now scheduled for US guided biopsy. PMHx, meds, imaging reviewed. Has been NPO today  Past Medical History:  Past Medical History  Diagnosis Date  . Tracheostomy in place   . Cancer 2010    laryngeal  . Lung cancer 2010  . Pneumonia 2014  . Dysrhythmia     takes Coreg and Lisinopril daily  . Shortness of breath     with exertion  . Enlarged prostate     takes flomax  . Urinary urgency   . Gastric ulcer     hx of  . Hx of transfusion of packed red blood cells     no abnormal reaction noted by patient  . Umbilical hernia   . Metastatic cancer 07/24/2012  . Hyperlipidemia   . ED (erectile dysfunction)   . Insomnia   . CHF (congestive heart failure)   . 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 Tracheostomy  2010  . Lung removal, partial  2010    left side  . Layngectomy for laryngeal cancer in2010; left upper lobectomy for lung cancer, 2010.    . Back surgery      thinks it was done in 2012  . Esophagogastroduodenoscopy    . Colonoscopy    . Portacath placement Bilateral 07/12/2012    Procedure: INSERTION PORT-A-CATH;  Surgeon: [name], MD;  Location: [facility] OR;  Service: Thoracic;  Laterality: Bilateral;    Family History:  Family History  Problem Relation Age of Onset  . Diabetes Father   . Heart disease Father     Social History:  reports that he has been smoking Cigarettes.  He has a 70 pack-year smoking history. He has never used smokeless tobacco. He reports that he does not drink alcohol or use illicit drugs.  Allergies: No Known Allergies  Medications:   Medication List    ASK your doctor about these medications        amoxicillin-clavulanate  875-125 MG per tablet  Commonly known as:  AUGMENTIN  Take 1 tablet by mouth 2 (two) times daily.     calcium carbonate 500 MG chewable tablet  Commonly known as:  TUMS - dosed in mg elemental calcium  Chew 1 tablet by mouth daily. 1000 mg     Calcium-Vitamin D3 600-500 MG-UNIT Caps  Take 1 capsule by mouth 3 (three) times daily with meals.     carvedilol 3.125 MG tablet  Commonly known as:  COREG  Take 1 tablet (3.125 mg total) by mouth 2 (two) times daily with a meal.     chlorzoxazone 500 MG tablet  Commonly known as:  PARAFON  TAKE ONE TABLET BY MOUTH THREE TIMES DAILY AS NEEDED FOR MUSCLE SPASMS     ciprofloxacin 250 MG tablet  Commonly known as:  CIPRO  Take 1 tablet (250 mg total) by mouth 2 (two) times daily.     esomeprazole 20 MG capsule  Commonly known as:  NEXIUM  Take 20 mg by mouth daily at 12 noon.     HYDROcodone-acetaminophen 10-325 MG per tablet  Commonly known as:  NORCO  Take 1 tablet by mouth every 6 (six) hours as needed.  levothyroxine 50 MCG tablet  Commonly known as:  SYNTHROID  Take 2 tablets (100 mcg total) by mouth daily before breakfast.     lidocaine-prilocaine cream  Commonly known as:  EMLA  Apply 1 application topically as needed. Apply to port 1 hour prior to accessing     lisinopril 5 MG tablet  Commonly known as:  PRINIVIL,ZESTRIL  TAKE ONE TABLET BY MOUTH ONCE DAILY     LORazepam 1 MG tablet  Commonly known as:  ATIVAN  TAKE ONE TABLET BY MOUTH AT BEDTIME AS NEEDED FOR ANXIETY     morphine 15 MG 12 hr tablet  Commonly known as:  MS CONTIN  Take 1 tablet (15 mg total) by mouth every 12 (twelve) hours.     ondansetron 8 MG tablet  Commonly known as:  ZOFRAN  Take by mouth every 8 (eight) hours as needed for nausea or vomiting. Starting day of chemo as needed     terazosin 5 MG capsule  Commonly known as:  HYTRIN  Take 1 or 2 capsules at bedtime for urinary hesitancy.        Please HPI for pertinent positives,  otherwise complete 10 system ROS negative.  Physical Exam: BP 106/57 mmHg  Pulse 94  Temp(Src) 98.2 F (36.8 C) (Oral)  Resp 18  Ht 5\' 7"  (1.702 m)  Wt 139 lb (63.05 kg)  BMI 21.77 kg/m2  SpO2 92% Body mass index is 21.77 kg/(m^2).   General Appearance:  Alert, cooperative, no distress, appears stated age  Head:  Normocephalic, without obvious abnormality, atraumatic  ENT: Has chronic patent tracheostomy  Neck: Supple, symmetrical, trachea midline  Lungs:   Clear to auscultation bilaterally, no w/r/r, respirations unlabored.  Chest Wall:  Palpable, NT, firm mass in left upper scapular region, no overlying skin changes.  Heart:  Regular rate and rhythm, S1, S2 normal, no murmur, rub or gallop.  Abdomen:   Soft, non-tender, non distended.  Neurologic: Normal affect, no gross deficits.  Labs: Results for orders placed or performed during the hospital encounter of 04/07/14 (from the past 48 hour(s))  CBC upon arrival     Status: Abnormal   Collection Time: 04/07/14  8:30 AM  Result Value Ref Range   WBC 7.3 4.0 - 10.5 K/uL   RBC 4.23 4.22 - 5.81 MIL/uL   Hemoglobin 12.1 (L) 13.0 - 17.0 g/dL   HCT 37.1 (L) 39.0 - 52.0 %   MCV 87.7 78.0 - 100.0 fL   MCH 28.6 26.0 - 34.0 pg   MCHC 32.6 30.0 - 36.0 g/dL   RDW 15.0 11.5 - 15.5 %   Platelets 176 150 - 400 K/uL    Imaging: No results found.  Assessment/Plan Hx of lung cancer Left scapular region soft tissue mass. For US biopsy today. Labs pending. Risks and Benefits discussed with the patient including, but not limited to bleeding, infection, damage to adjacent structures or low yield requiring additional tests. All of the patient's questions were answered, patient is agreeable to proceed. Consent signed and in chart.   Ascencion Dike PA-C 04/07/2014, 9:41 AM

## 2014-04-07 NOTE — Sedation Documentation (Signed)
Patient denies pain and is resting comfortably.  

## 2014-04-07 NOTE — Discharge Instructions (Signed)
Fine Needle Aspiration Fine needle aspiration is a procedure used to remove a piece of tissue. The tissue may be removed from a swelling or abnormal growth (tumor). It is also used to confirm a cyst. A cyst is a fluid filled sac. The procedure may be done by your caregiver or a specialist. LET YOUR CAREGIVER KNOW ABOUT:   Any medications you take, especially blood thinners like aspirin.  Any problems you may have had with similar procedures in the past. RISKS AND COMPLICATIONS This is a safe procedure. There is a very small risk of infection and or bleeding. Other complications can occur if the aspiration site is deep in the body. Your caregiver or specialist will explain this to you.  BEFORE THE PROCEDURE   No special preparation is needed in most cases. Your caregiver will let you know if there are special requirements, such as an empty stomach.  Be sure to ask your caregiver any questions before the procedure starts. PROCEDURE   This procedure is done under local or no anesthetic.  The skin is cleaned carefully.  A thin needle is directed into a lump. The needle is directed in several different directions into the lump while suction is applied to the needle. When samples are removed, the needle is withdrawn.  The contents obtained are placed on a slide. They are then fixed, stained and examined under the microscope. The slide is examined by a specialist in the examination of tissue (pathologist).  A diagnosis can then be made. The pathologist will decide if the specimen is cancerous (malignant) or not cancerous (benign). If fluid is taken from a cyst, cells from the fluid can be examined. If no material is obtained from a fine needle aspiration, the sample may not rule out a problem. Sometimes the procedure is done again. The pathologist may need several days before a result is available. AFTER THE PROCEDURE   There are usually no limits on diet or activity after a fine needle  aspiration.  Your caregiver may give you instructions regarding the aspiration site. This will include information about keeping the site clean and dry. Follow these instructions carefully.  Call for your test results as instructed by your caregiver. Remember, it is your responsibility to get the results of your testing. Do not think everything is fine if you have not heard from your caregiver.  Keep a close watch on the aspiration site. Report any redness, swelling or drainage.  You should not have much pain at the aspiration site.  Take any medications as told by your caregiver. SEEK MEDICAL CARE IF:   You have pain or drainage at the aspiration site that does not go away.  Swelling at the aspiration site does not gradually go away. SEEK IMMEDIATE MEDICAL CARE IF:   You develop a fever a day or two after the aspiration.  You develop severe pain at the aspiration site.  You develop a warm, tender swelling at the aspiration site. Document Released: 01/01/2000 Document Revised: 03/28/2011 Document Reviewed: 09/14/2007 [facility] Patient Information 2015 [place], [place]. This information is not intended to replace advice given to you by your health care provider. Make sure you discuss any questions you have with your health care provider.

## 2014-04-07 NOTE — Procedures (Signed)
Procedure:  Ultrasound guided core biopsy of left scapular mass Findings:  Large soft tissue mass superficial to left scapula.  18 G core biopsy x 6. No complications. EBL <25 mL

## 2014-04-07 NOTE — Sedation Documentation (Signed)
Patient is resting comfortably. 

## 2014-04-10 ENCOUNTER — Encounter (HOSPITAL_COMMUNITY): Payer: Managed Care, Other (non HMO)

## 2014-04-10 ENCOUNTER — Encounter (HOSPITAL_BASED_OUTPATIENT_CLINIC_OR_DEPARTMENT_OTHER): Payer: Managed Care, Other (non HMO) | Admitting: Hematology & Oncology

## 2014-04-10 ENCOUNTER — Encounter (HOSPITAL_COMMUNITY): Payer: Self-pay | Admitting: Lab

## 2014-04-10 ENCOUNTER — Encounter (HOSPITAL_COMMUNITY): Payer: Self-pay | Admitting: Hematology & Oncology

## 2014-04-10 ENCOUNTER — Ambulatory Visit (HOSPITAL_COMMUNITY): Payer: Managed Care, Other (non HMO)

## 2014-04-10 ENCOUNTER — Encounter (HOSPITAL_BASED_OUTPATIENT_CLINIC_OR_DEPARTMENT_OTHER): Payer: Managed Care, Other (non HMO)

## 2014-04-10 VITALS — BP 119/71 | HR 77 | Temp 97.7°F | Resp 20 | Wt 133.2 lb

## 2014-04-10 DIAGNOSIS — C801 Malignant (primary) neoplasm, unspecified: Secondary | ICD-10-CM

## 2014-04-10 DIAGNOSIS — C7951 Secondary malignant neoplasm of bone: Secondary | ICD-10-CM | POA: Diagnosis not present

## 2014-04-10 DIAGNOSIS — C7931 Secondary malignant neoplasm of brain: Secondary | ICD-10-CM | POA: Diagnosis not present

## 2014-04-10 DIAGNOSIS — Z85118 Personal history of other malignant neoplasm of bronchus and lung: Secondary | ICD-10-CM | POA: Diagnosis not present

## 2014-04-10 DIAGNOSIS — C799 Secondary malignant neoplasm of unspecified site: Secondary | ICD-10-CM

## 2014-04-10 LAB — CBC WITH DIFFERENTIAL/PLATELET
BASOS PCT: 1 % (ref 0–1)
Basophils Absolute: 0.1 10*3/uL (ref 0.0–0.1)
EOS ABS: 0.6 10*3/uL (ref 0.0–0.7)
Eosinophils Relative: 8 % — ABNORMAL HIGH (ref 0–5)
HCT: 36.9 % — ABNORMAL LOW (ref 39.0–52.0)
Hemoglobin: 11.9 g/dL — ABNORMAL LOW (ref 13.0–17.0)
Lymphocytes Relative: 5 % — ABNORMAL LOW (ref 12–46)
Lymphs Abs: 0.4 10*3/uL — ABNORMAL LOW (ref 0.7–4.0)
MCH: 28.5 pg (ref 26.0–34.0)
MCHC: 32.2 g/dL (ref 30.0–36.0)
MCV: 88.5 fL (ref 78.0–100.0)
MONOS PCT: 6 % (ref 3–12)
Monocytes Absolute: 0.4 10*3/uL (ref 0.1–1.0)
NEUTROS PCT: 80 % — AB (ref 43–77)
Neutro Abs: 6 10*3/uL (ref 1.7–7.7)
Platelets: 187 10*3/uL (ref 150–400)
RBC: 4.17 MIL/uL — ABNORMAL LOW (ref 4.22–5.81)
RDW: 14.9 % (ref 11.5–15.5)
WBC: 7.5 10*3/uL (ref 4.0–10.5)

## 2014-04-10 LAB — COMPREHENSIVE METABOLIC PANEL
ALBUMIN: 3.2 g/dL — AB (ref 3.5–5.2)
ALT: 9 U/L (ref 0–53)
AST: 11 U/L (ref 0–37)
Alkaline Phosphatase: 57 U/L (ref 39–117)
Anion gap: 8 (ref 5–15)
BUN: 25 mg/dL — ABNORMAL HIGH (ref 6–23)
CO2: 22 mmol/L (ref 19–32)
CREATININE: 1.4 mg/dL — AB (ref 0.50–1.35)
Calcium: 8.5 mg/dL (ref 8.4–10.5)
Chloride: 104 mmol/L (ref 96–112)
GFR calc Af Amer: 54 mL/min — ABNORMAL LOW (ref >90)
GFR calc non Af Amer: 47 mL/min — ABNORMAL LOW (ref >90)
Glucose, Bld: 99 mg/dL (ref 70–99)
Potassium: 4.1 mmol/L (ref 3.5–5.1)
Sodium: 134 mmol/L — ABNORMAL LOW (ref 135–145)
TOTAL PROTEIN: 7 g/dL (ref 6.0–8.3)
Total Bilirubin: 0.5 mg/dL (ref 0.3–1.2)

## 2014-04-10 LAB — TSH: TSH: 8.782 u[IU]/mL — ABNORMAL HIGH (ref 0.350–4.500)

## 2014-04-10 NOTE — Progress Notes (Signed)
Referral sent to [facility].  Records faxed on 3/24

## 2014-04-10 NOTE — Patient Instructions (Signed)
[place] at [facility] Discharge Instructions  RECOMMENDATIONS MADE BY THE CONSULTANT AND ANY TEST RESULTS WILL BE SENT TO YOUR REFERRING PHYSICIAN.  No chemo for now. We will refer to Radiation Oncology. After you see Radiation we will plan for a Mohit Zirbes/different chemotherapy. Return as scheduled.  Thank you for choosing [place] at [facility] to provide your oncology and hematology care.  To afford each patient quality time with our provider, please arrive at least 15 minutes before your scheduled appointment time.    You need to re-schedule your appointment should you arrive 10 or more minutes late.  We strive to give you quality time with our providers, and arriving late affects you and other patients whose appointments are after yours.  Also, if you no show three or more times for appointments you may be dismissed from the clinic at the providers discretion.     Again, thank you for choosing [facility].  Our hope is that these requests will decrease the amount of time that you wait before being seen by our physicians.       _____________________________________________________________  Should you have questions after your visit to [facility], please contact our office at [phone] between the hours of 8:30 a.m. and 4:30 p.m.  Voicemails left after 4:30 p.m. will not be returned until the following business day.  For prescription refill requests, have your pharmacy contact our office.

## 2014-04-10 NOTE — Progress Notes (Signed)
[name], MD [place] [address]   DIAGNOSIS: Metastatic cancer   Staging form: Lung, AJCC 7th Edition     Clinical: Stage IV (T1, N0, M1a) - Unsigned   SUMMARY OF ONCOLOGIC HISTORY:   Metastatic cancer   02/17/2011 Imaging PET scan- B/L hypermetabolic pulmonary nodules (3 in total) are most consistent with metastatic disease. No evidence of mediastinal nodal metastasis.  No evidence of metastasis in the abdomen or pelvis.   06/19/2012 Imaging CT scan of chest- Slight enlargement of pulmonary nodules/metastasis.    07/31/2012 - 11/12/2012 Chemotherapy Carboplatin/Paclitaxel x 6 cycles   11/26/2012 Imaging CT CAP- Similar size of a R apical pulm nodule.  Similar to improved left lower lobe nodularity, favored to be post infectious or inflammatory. Similar nodularity in the right middle lobe. No evidence of new or progressive disease. Similar borderline adeno    12/10/2012 - 05/07/2013 Chemotherapy Tarceva 150 mg daily   05/03/2013 Progression CT CAP- Interval increase in size of right upper lobe adjacent pulmonary nodules is highly concerning for progression of lung cancer recurrence.   07/31/2013 - 04/10/2014 Chemotherapy Nivolumab   10/21/2013 Imaging CT CAP- The dominant right apical mass is slightly increased in size from previous exam. The adjacent nodule is stable.   10/31/2013 Imaging CT of head- Intermediate density subdural collection/hematoma along the convexity on the right, maximal thickness 7 mm. 2 mm of right-to-left shift. No hyper acute hemorrhage.  Lytic lesion right posterior frontal calvarium, new since the previous study.   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 Widespread underlying dural thickening and enhancement in the R hemisphere suspicious for meningeal involvement.   12/24/2013 Imaging CT CAP- No clear enlargement of right apical mass.  Stable right middle lobe nodule. Mild mediastinal lymphadenopathy.   12/26/2013 Procedure Spinal CSF testing- negative for malignancy.   01/14/2014 Imaging Skeletal survey- Lytic lesion noted in the right frontoparietal region as noted on recent head CT of 10/31/2013. This is consist with metastatic disease. No other focal bony lesions noted.   03/25/2014 Imaging focal soft tissue mass 7.5 x 3.4 x 6.4 cm in size with associated small area of osseous destruction of the medial LEFT scapula. This most likely represents an osseous metastasis with a large extraosseous soft tissue component.    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 male returns for follow-up of the metastatic squamous cell carcinoma of the lung. He also has a history of laryngeal carcinoma. We noted a small scapular lesion on exam several weeks ago. It was not reported on CT imaging, but the patient noted significant growth over an approximate 1-2 week period and came in for evaluation. CT images were reviewed again and the area in the left scapula was reported on an addendum. Today he complains of significant pain and has noticed it is now inhibiting his ability to move his arm well. We have discussed changing chemotherapy regimens.  MEDICAL HISTORY: Past Medical History  Diagnosis Date  . Tracheostomy in place   . Cancer 2010    laryngeal  . Lung cancer 2010  . Pneumonia 2014  . Dysrhythmia     takes Coreg and Lisinopril daily  . Shortness of breath     with exertion  . Enlarged  prostate     takes flomax  . Urinary urgency   . Gastric ulcer     hx of  . Hx of transfusion of packed red blood cells     no abnormal reaction noted by patient  . Umbilical hernia   . Metastatic cancer 07/24/2012  . Hyperlipidemia   . ED (erectile dysfunction)   . Insomnia   . CHF (congestive heart  failure)   . 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     has No Known Allergies.  Mr. Nez does not currently have medications on file.  SURGICAL HISTORY: Past Surgical History  Procedure Laterality Date  . Tracheostomy  2010  . Lung removal, partial  2010    left side  . Layngectomy for laryngeal cancer in2010; left upper lobectomy for lung cancer, 2010.    . Back surgery      thinks it was done in 2012  . Esophagogastroduodenoscopy    . Colonoscopy    . Portacath placement Bilateral 07/12/2012    Procedure: INSERTION PORT-A-CATH;  Surgeon: [name], MD;  Location: [facility];  Service: Thoracic;  Laterality: Bilateral;    SOCIAL HISTORY: History   Social History  . Marital Status: Married    Spouse Name: N/A  . Number of Children: 1  . Years of Education: N/A   Occupational History  . Not on file.   Social History Main Topics  . Smoking status: Current Every Day Smoker -- 1.00 packs/day for 70 years    Types: Cigarettes  . Smokeless tobacco: Never Used  . Alcohol Use: No  . Drug Use: No  . Sexual Activity: Not Currently   Other Topics Concern  . Not on file   Social History Narrative    FAMILY HISTORY: Family History  Problem Relation Age of Onset  . Diabetes Father   . Heart disease Father     Review of Systems  HENT: Negative.   Eyes: Negative.   Respiratory: Positive for cough.   Cardiovascular: Negative.   Gastrointestinal: Negative.   Genitourinary:  Negative.   Musculoskeletal: Positive for back pain.       Scapular pain L  Skin: Negative.   Neurological: Positive for weakness. Negative for dizziness, tingling, tremors, sensory change, speech change, focal weakness, seizures and loss of consciousness.  Endo/Heme/Allergies: Negative.   Psychiatric/Behavioral: Negative.     PHYSICAL EXAMINATION  ECOG PERFORMANCE STATUS: 1 - Symptomatic but completely ambulatory  Filed Vitals:   04/10/14 1005  BP: 119/71  Pulse: 77  Temp: 97.7 F (36.5 C)  Resp: 20    Physical Exam  Constitutional: He is oriented to person, place, and time.  Thin pleasant male, tracheostomy site  HENT:  Head: Normocephalic and atraumatic.  Nose: Nose normal.  Mouth/Throat: Oropharynx is clear and moist. No oropharyngeal exudate.  Eyes: Conjunctivae and EOM are normal. Pupils are equal, round, and reactive to light. Right eye exhibits no discharge. Left eye exhibits no discharge. No scleral icterus.  Neck: Normal range of motion. Neck supple. No tracheal deviation present. No thyromegaly present.  Cardiovascular: Normal  rate, regular rhythm and normal heart sounds.  Exam reveals no gallop and no friction rub.   No murmur heard. Pulmonary/Chest: Effort normal. He has no wheezes. He has no rales.    Occasional coarse BS that clear with cough  Abdominal: Soft. Bowel sounds are normal. He exhibits no distension and no mass. There is no tenderness. There is no rebound and no guarding.  Musculoskeletal: Normal range of motion. He exhibits no edema.  Limited ROM LUE secondary to pain  Lymphadenopathy:    He has no cervical adenopathy.  Neurological: He is alert and oriented to person, place, and time. He has normal reflexes. No cranial nerve deficit. Gait normal. Coordination normal.  Skin: Skin is warm and dry. No rash noted.  Psychiatric: Mood, memory, affect and judgment normal.  Nursing note and vitals reviewed.   LABORATORY DATA:  CBC    Component  Value Date/Time   WBC 7.3 04/07/2014 0830   RBC 4.23 04/07/2014 0830   HGB 12.1* 04/07/2014 0830   HCT 37.1* 04/07/2014 0830   PLT 176 04/07/2014 0830   MCV 87.7 04/07/2014 0830   MCH 28.6 04/07/2014 0830   MCHC 32.6 04/07/2014 0830   RDW 15.0 04/07/2014 0830   LYMPHSABS 0.4* 03/27/2014 1120   MONOABS 0.4 03/27/2014 1120   EOSABS 0.1 03/27/2014 1120   BASOSABS 0.0 03/27/2014 1120   CMP     Component Value Date/Time   NA 141 03/27/2014 1120   K 3.2* 03/27/2014 1120   CL 121* 03/27/2014 1120   CO2 17* 03/27/2014 1120   GLUCOSE 68* 03/27/2014 1120   BUN 24* 03/27/2014 1120   CREATININE 1.05 03/27/2014 1120   CREATININE 1.46* 11/12/2012 0839   CALCIUM 5.2* 03/27/2014 1120   CALCIUM 8.5 01/26/2012 1312   PROT 4.5* 03/27/2014 1120   ALBUMIN 2.0* 03/27/2014 1120   AST 7 03/27/2014 1120   ALT 7 03/27/2014 1120   ALKPHOS 33* 03/27/2014 1120   BILITOT 0.2* 03/27/2014 1120   GFRNONAA 66* 03/27/2014 1120   GFRAA 76* 03/27/2014 1120    ASSESSMENT and THERAPY PLAN:    Metastatic cancer Stage IV squamous cell carcinoma lung vs. head and neck primary with progression on Nivolumab. He has had the development of a large scapular lesion that has progressed quite rapidly. I discussed with the patient that I recommend referral to radiation oncology for palliation and control of this area. In regards to chemotherapy we obviously will discontinue Nivolumab, he has several other options including Gemzar, Taxotere weekly, Ramucirumab. I have recommended sending his biopsy off for ALK and EGFR testing (he has been on Tarceva) as I cannot find documentation that this has been done.  We will see him back one week after he starts his new therapy.  His labs are pending today, I will call him with the results when available.    End of Life Issues These issues have to be addressed with the patient and his wife moving forward. I do not believe they have ever had significant conversations in regards to  end of life issues.  All questions were answered. The patient knows to call the clinic with any problems, questions or concerns. We can certainly see the patient much sooner if necessary.  Molli Hazard 04/10/2014

## 2014-04-10 NOTE — Assessment & Plan Note (Addendum)
Stage IV squamous cell carcinoma lung vs. head and neck primary with progression on Nivolumab. He has had the development of a large scapular lesion that has progressed quite rapidly. I discussed with the patient that I recommend referral to radiation oncology for palliation and control of this area. In regards to chemotherapy we obviously will discontinue Nivolumab, he has several other options including Gemzar, Taxotere weekly, Ramucirumab. I have recommended sending his biopsy off for ALK and EGFR testing (he has been on Tarceva) as I cannot find documentation that this has been done.  We will see him back one week after he starts his new therapy.  His labs are pending today, I will call him with the results when available.

## 2014-04-10 NOTE — Progress Notes (Signed)
Labs drawn

## 2014-04-10 NOTE — Progress Notes (Signed)
Jeffrey [name]  Not being treated today.   Blood work only.

## 2014-04-11 LAB — VITAMIN D 25 HYDROXY (VIT D DEFICIENCY, FRACTURES): Vit D, 25-Hydroxy: 27.4 ng/mL — ABNORMAL LOW (ref 30.0–100.0)

## 2014-04-16 ENCOUNTER — Ambulatory Visit (HOSPITAL_COMMUNITY): Payer: Medicare Other | Admitting: Oncology

## 2014-04-24 ENCOUNTER — Ambulatory Visit (HOSPITAL_COMMUNITY): Payer: Medicare Other

## 2014-04-28 ENCOUNTER — Encounter (HOSPITAL_COMMUNITY): Payer: Self-pay | Admitting: Oncology

## 2014-04-28 ENCOUNTER — Encounter (HOSPITAL_COMMUNITY): Payer: Managed Care, Other (non HMO) | Attending: Oncology | Admitting: Oncology

## 2014-04-28 DIAGNOSIS — R630 Anorexia: Secondary | ICD-10-CM

## 2014-04-28 DIAGNOSIS — Z85118 Personal history of other malignant neoplasm of bronchus and lung: Secondary | ICD-10-CM

## 2014-04-28 DIAGNOSIS — R634 Abnormal weight loss: Secondary | ICD-10-CM

## 2014-04-28 DIAGNOSIS — C799 Secondary malignant neoplasm of unspecified site: Secondary | ICD-10-CM

## 2014-04-28 DIAGNOSIS — R918 Other nonspecific abnormal finding of lung field: Secondary | ICD-10-CM | POA: Insufficient documentation

## 2014-04-28 DIAGNOSIS — C801 Malignant (primary) neoplasm, unspecified: Secondary | ICD-10-CM | POA: Diagnosis not present

## 2014-04-28 MED ORDER — MEGESTROL ACETATE 400 MG/10ML PO SUSP: 800.0000 mg | Freq: Every day | ORAL | Status: DC

## 2014-04-28 MED ORDER — HYDROCODONE-ACETAMINOPHEN 10-325 MG PO TABS: 1.0000 | ORAL_TABLET | Freq: Four times a day (QID) | ORAL | Status: DC | PRN

## 2014-04-28 NOTE — Patient Instructions (Signed)
[place] at [facility] Discharge Instructions  RECOMMENDATIONS MADE BY THE CONSULTANT AND ANY TEST RESULTS WILL BE SENT TO YOUR REFERRING PHYSICIAN.  Discussion by Robynn Pane, PA-C. The chemotherapy that you were getting was not working and that is why we stopped it. Will wait until you complete radiation to discuss further therapy. Megace for your appetite - take as directed. You have refills left on your Lorazepam  Will see you back in 2 weeks to discuss further treatment options.  Thank you for choosing [place] at [facility] to provide your oncology and hematology care.  To afford each patient quality time with our provider, please arrive at least 15 minutes before your scheduled appointment time.    You need to re-schedule your appointment should you arrive 10 or more minutes late.  We strive to give you quality time with our providers, and arriving late affects you and other patients whose appointments are after yours.  Also, if you no show three or more times for appointments you may be dismissed from the clinic at the providers discretion.     Again, thank you for choosing [facility].  Our hope is that these requests will decrease the amount of time that you wait before being seen by our physicians.       _____________________________________________________________  Should you have questions after your visit to [facility], please contact our office at [phone] between the hours of 8:30 a.m. and 4:30 p.m.  Voicemails left after 4:30 p.m. will not be returned until the following business day.  For prescription refill requests, have your pharmacy contact our office.

## 2014-04-28 NOTE — Progress Notes (Signed)
Jeffrey Battiest, MD [place] [address]  Metastatic cancer - Plan: HYDROcodone-acetaminophen ([place]) 10-325 MG per tablet  Weight loss - Plan: megestrol (MEGACE) 400 MG/10ML suspension  History of lung cancer - Plan: HYDROcodone-acetaminophen ([place]) 10-325 MG per tablet  CURRENT THERAPY: Xgeva.  Anticipating change in therapy due to progressive disease.  Beginning palliative radiation to left scapular lesion tomorrow, 04/29/2014.  INTERVAL HISTORY: Jeffrey [name] 81 y.o. 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 Nils Pyle and Dr. Radene Journey respectively.  Now with progressive disease on Nivolumab with a left scapular lesion with significant growth and biopsy proven to be metastatic squamous cell carcinoma.      Metastatic cancer   02/17/2011 Imaging PET scan- B/L hypermetabolic pulmonary nodules (3 in total) are most consistent with metastatic disease. No evidence of mediastinal nodal metastasis.  No evidence of metastasis in the abdomen or pelvis.   06/19/2012 Imaging CT scan of chest- Slight enlargement of pulmonary nodules/metastasis.    07/31/2012 - 11/12/2012 Chemotherapy Carboplatin/Paclitaxel x 6 cycles   11/26/2012 Imaging CT CAP- Similar size of a R apical pulm nodule.  Similar to improved left lower lobe nodularity, favored to be post infectious or inflammatory. Similar nodularity in the right middle lobe. No evidence of new or progressive disease. Similar borderline adeno    12/10/2012 - 05/07/2013 Chemotherapy Tarceva 150 mg daily   05/03/2013 Progression CT CAP- Interval increase in size of right upper lobe adjacent pulmonary nodules is highly concerning for progression of lung cancer recurrence.   07/31/2013 - 04/10/2014 Chemotherapy Nivolumab   10/21/2013 Imaging CT CAP- The dominant right apical mass is slightly increased in size from  previous exam. The adjacent nodule is stable.   10/31/2013 Imaging CT of head- Intermediate density subdural collection/hematoma along the convexity on the right, maximal thickness 7 mm. 2 mm of right-to-left shift. No hyper acute hemorrhage.  Lytic lesion right posterior frontal calvarium, new since the previous study.   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 Widespread underlying dural thickening and enhancement in the R hemisphere suspicious for meningeal involvement.   12/24/2013 Imaging CT CAP- No clear enlargement of right apical mass. Stable right middle lobe nodule. Mild mediastinal lymphadenopathy.   12/26/2013 Procedure Spinal CSF testing- negative for malignancy.   01/14/2014 Imaging Skeletal survey- Lytic lesion noted in the right frontoparietal region as noted on recent head CT of 10/31/2013. This is consist with metastatic disease. No other focal bony lesions noted.   03/25/2014 Imaging focal soft tissue mass 7.5 x 3.4 x 6.4 cm in size with associated small area of osseous destruction of the medial LEFT scapula. This most likely represents an osseous metastasis with a large extraosseous soft tissue component.   04/07/2014 Progression Soft Tissue Needle Core Biopsy, Left scapular and adjacent tissues - METASTATIC SQUAMOUS CELL CARCINOMA.    Radiation Therapy Starting XRT with Dr. Lisbeth Renshaw on 04/29/2014 with 14 fractions.    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  The results are noted within this dictation.  I personally reviewed and went over pathology results with the patient.  Today was a difficult conversation with  Jeffrey [name].  He thought that Nivolumab was the incorrect chemotherapy for him due to his interpretation of his appointment when it was discontinued.   He is educated on the indication for Nivolumab and yes, he did receive appropriate therapy.    He is slated to start radiation therapy tomorrow under Dr. Ida Rogue guidance to the left scapular lesion.  He reports that he will be treated in 14 fractions with a boost if needed.  He is hopeful that his left shoulder pain will improve with radiation, but he is reminded that his left should pain dates back many months and is less likely to be secondary to metastatic disease and is more likely an arthropathy.  He agrees with this possibility.  He is re-educated that his cancer is not curable, but hopefully treatable for some time.  I told him frankly that at some time, his cancer will be a major issue for him and may cause his demise.  He was not fond of hearing this information.  Additionally, he was educated that as we move through lines of therapy, they become less effective.  "I would rather get hit by a truck than die of cancer."  "I am too young to die of cancer."  I pressed this issue further and he reports "everyone I knew who died of cancer it was a miserable way to go."  He notes that these people just went to sleep, laid in bed, and passed away.  He has a very skewed definition of "horrible death."  When I asked him in what way that sounds like a horrible death, he notes that he wants to work hard every day and be active all the way up until the day he dies.  He has a very unrealistic idea of a comfortable death with dignity in the setting of a terminal cancer.  I will need to work with him further in the future to hopefully start thinking more reasonably and realistically.   By the end of our long discussion today, he was "cutting up" with me and teasing and laughing.    He is not ready mentally to start facing his mortality and his disease.  This will continue to be a topic of conversation.  He notes a decreased appetite with mild weight weight loss. "I want something for my appetite."  I spent  some time going over Megace therapy with him.  I discussed the risks, benefits, alternatives, and side effects of this therapy including increase risk for VTE.    Oncologically, he otherwise denies any complaints and ROS questioning is negative.  Past Medical History  Diagnosis Date  . Tracheostomy in place   . Cancer 2010    laryngeal  . Lung cancer 2010  . Pneumonia 2014  . Dysrhythmia     takes Coreg and Lisinopril daily  . Shortness of breath     with exertion  . Enlarged prostate     takes flomax  . Urinary urgency   . Gastric ulcer     hx of  . Hx of transfusion of packed red blood cells     no abnormal reaction noted by patient  . Umbilical hernia   . Metastatic cancer 07/24/2012  . Hyperlipidemia   . ED (erectile dysfunction)   . Insomnia   . CHF (congestive heart failure)   . 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     has No Known Allergies.  Mr. Bara had no medications administered during this visit.  Past Surgical History  Procedure Laterality Date  . Tracheostomy  2010  . Lung removal, partial  2010    left side  . Layngectomy for laryngeal cancer in2010; left upper lobectomy for lung cancer, 2010.    . Back surgery      thinks it was done in 2012  . Esophagogastroduodenoscopy    . Colonoscopy    . Portacath placement Bilateral 07/12/2012    Procedure: INSERTION PORT-A-CATH;  Surgeon: [name], MD;  Location: [facility] OR;  Service: Thoracic;  Laterality: Bilateral;      Denies any headaches, dizziness, double vision, fevers, chills, night sweats, nausea, vomiting, diarrhea, constipation, chest pain, heart palpitations, shortness of breath, blood in stool, black tarry stool, urinary pain, urinary burning, urinary frequency, hematuria.   PHYSICAL EXAMINATION  ECOG PERFORMANCE STATUS: 1 - Symptomatic but completely ambulatory  There were no vitals filed for this visit.  GENERAL:alert, no distress, well nourished, well developed, comfortable, cooperative and accompanied by his mother-in-law. SKIN: skin color, texture, turgor are normal, no rashes or significant lesions HEAD: Normocephalic, No masses, lesions, tenderness or abnormalities EYES: normal, PERRLA, EOMI, Conjunctiva are pink and non-injected EARS: External ears normal OROPHARYNX:lips, buccal mucosa, and tongue normal and mucous membranes are moist  NECK: supple, trachea midline LYMPH:  not examined BREAST:not examined LUNGS: not examined  HEART: not examined ABDOMEN:not examined BACK: large left scapular lesion noted EXTREMITIES:less then 2 second capillary refill, no joint deformities, effusion, or inflammation, no edema, no skin discoloration  NEURO: alert & oriented x 3 with fluent speech, no focal motor/sensory deficits, gait normal    LABORATORY DATA: CBC    Component Value Date/Time   WBC 7.5 04/10/2014 1052   RBC 4.17* 04/10/2014 1052   HGB 11.9* 04/10/2014 1052   HCT 36.9* 04/10/2014 1052   PLT 187 04/10/2014 1052   MCV 88.5 04/10/2014 1052   MCH 28.5 04/10/2014 1052   MCHC 32.2 04/10/2014 1052   RDW 14.9 04/10/2014 1052   LYMPHSABS 0.4* 04/10/2014 1052   MONOABS 0.4 04/10/2014 1052   EOSABS 0.6 04/10/2014 1052   BASOSABS 0.1 04/10/2014 1052      Chemistry      Component Value Date/Time   NA 134* 04/10/2014 1052   K 4.1 04/10/2014 1052   CL 104 04/10/2014 1052   CO2 22 04/10/2014 1052   BUN 25* 04/10/2014 1052   CREATININE 1.40* 04/10/2014 1052   CREATININE  1.46* 11/12/2012 0839      Component Value Date/Time   CALCIUM 8.5 04/10/2014 1052   CALCIUM 8.5 01/26/2012 1312   ALKPHOS 57 04/10/2014 1052   AST 11 04/10/2014 1052   ALT 9 04/10/2014 1052   BILITOT 0.5 04/10/2014 1052      RADIOGRAPHIC STUDIES:  Dg Scapula Left  03/31/2014   CLINICAL DATA:  Six months of left shoulder discomfort following and exertion incident and September ; known metastatic lung malignancy  EXAM: LEFT SCAPULA - 2+ VIEWS  COMPARISON:  Left shoulder series of today's date  FINDINGS: The bones are mildly osteopenic. The scapula reveals no lytic or blastic lesion. No  fracture is evident. There is apical pleural thickening on the left and chronic change associated with seventh and eighth ribs.  IMPRESSION: There is no acute bony abnormality of the left scapula.   Electronically Signed   By: David  Martinique   On: 03/31/2014 14:05   US Biopsy  04/07/2014   CLINICAL DATA:  History of laryngeal squamous cell carcinoma and left upper lobe squamous lung carcinoma. New destructive mass of the left posterior shoulder region causing bony destruction of a portion of the scapula.  EXAM: ULTRASOUND GUIDED CORE BIOPSY OF LEFT SCAPULAR MASS  MEDICATIONS: 0.5 mg IV Versed; 25 mcg IV Fentanyl  Total Moderate Sedation Time: 8 minutes.  PROCEDURE: The procedure, risks, benefits, and alternatives were explained to the patient. Questions regarding the procedure were encouraged and answered. The patient understands and consents to the procedure. A time-out was performed prior to the procedure.  The the left posterior shoulder region was prepped with Betadine in a sterile fashion, and a sterile drape was applied covering the operative field. A sterile gown and sterile gloves were used for the procedure. Local anesthesia was provided with 1% Lidocaine.  Under ultrasound guidance, a total of 6 core biopsy samples were obtained of a subcutaneous mass involving the medial left clavicle. Solid tissue was  obtained.  COMPLICATIONS: None.  FINDINGS: Large soft tissue mass noted in the left posterior scapular region. Solid tissue was obtained.  IMPRESSION: Ultrasound-guided core biopsy of large left periscapular mass.   Electronically Signed   By: Aletta Edouard M.D.   On: 04/07/2014 13:26   Dg Shoulder Left  03/31/2014   CLINICAL DATA:  Over 6 months of pain since an episode of exertion in September of 2015  EXAM: LEFT SHOULDER - 2+ VIEW  COMPARISON:  Scapula series of today's date  FINDINGS: The bones of the left shoulder are osteopenic. There is no definite lytic nor blastic lesion. A subcentimeter lucency in the humeral head is most likely physiologic. The glenohumeral joint is unremarkable. The [facility] joint where visualized is grossly normal. The observed portions of the left clavicle and upper left ribs are unremarkable. There is evidence previous osteotomy of the left seventh rib and there is deformity of the posterior aspect of the adjacent left eighth rib. There is apical pleural thickening on the left.  IMPRESSION: There is no acute bony abnormality of the left shoulder. There are old deformities -postsurgical changes of the left seventh and eighth ribs. There is asymmetric left apical pleural thickening.   Electronically Signed   By: David  Martinique   On: 03/31/2014 14:02     PATHOLOGY:  Soft Tissue Needle Core Biopsy, Left scapular and adjacent tissues - METASTATIC SQUAMOUS CELL CARCINOMA.    ASSESSMENT AND PLAN:  Metastatic cancer Squamous cell carcinoma of the lung (most likely) in the setting of squamous cell carcinoma of larynx at the same time in 2010, initially treated surgically.  We noted a small scapular lesion on exam several weeks ago. It was not reported on CT imaging, but the patient noted significant growth over an approximate 1-2 week period and came in for evaluation. CT images were reviewed again and the area in the left scapula was reported on an addendum. This lesion is biopsy  proven to be metastatic squamous cell carcinoma.  Oncology history updated to reflect most recent events.  He requests a refill on his Lorazepam, but he should have refills already from his last Rx.  He requests a refill on his pain  medication which I refilled for him today.  He displays a mild weight loss with decreased appetite, therefore, I will order full dose Megace at 800 mg daily.  Rx is printed.  I provided him education regarding Nivolumab and the indications of its use in cancer care.  I provided him education, again, on his malignancy, stage, and the terminality of his cancer.  It is re-iterated to him today that his cancer is incurable, but we have a few treatment option(s) left if he is interested.  His options include Cetuximab, Gemcitabine, Vinorelbine, and a few other single-agents that are well tolerated, which is important for him.  In choosing therapy options, we will need to be mindful of his CHF with decreased LVEF.    He continues to be in complete denial of his malignancy and its incurability.  His expectations are inappropriate and unrealistic.  Future Hospice intervention will be very difficult.  I admit in hindsight that I missed an opportunity to discuss code status, and I will work on that in the future.    Given the conversation today, I did not pursue therapy options with him.  Instead, he will start Radiation therapy tomorrow.  In approximately 10 day, he will return for an appointment to discuss future treatment options systemically.  I believe that starting with radiation first and then discussing treatment options is in the patient's best interest to help him make solid decisions and to avoid a feeling of being overwhelmed with "too many moving parts."  He is agreeable to return in 7-10 days to discuss treatment options.  Hopefully some of his symptoms will improve by then and his frame of mind will be better.      THERAPY PLAN:  Future discussion regarding  systemic treatment options will take place in 1-2 weeks.  Ongoing discussions will be needed regarding code status and goals of care.  All questions were answered. The patient knows to call the clinic with any problems, questions or concerns. We can certainly see the patient much sooner if necessary.  Patient and plan discussed with Dr. Ancil Linsey and she is in agreement with the aforementioned.   This note is electronically signed by: Robynn Pane 04/28/2014 1:59 PM

## 2014-04-28 NOTE — Assessment & Plan Note (Addendum)
Squamous cell carcinoma of the lung (most likely) in the setting of squamous cell carcinoma of larynx at the same time in 2010, initially treated surgically.  We noted a small scapular lesion on exam several weeks ago. It was not reported on CT imaging, but the patient noted significant growth over an approximate 1-2 week period and came in for evaluation. CT images were reviewed again and the area in the left scapula was reported on an addendum. This lesion is biopsy proven to be metastatic squamous cell carcinoma.  Oncology history updated to reflect most recent events.  He requests a refill on his Lorazepam, but he should have refills already from his last Rx.  He requests a refill on his pain medication which I refilled for him today.  He displays a mild weight loss with decreased appetite, therefore, I will order full dose Megace at 800 mg daily.  Rx is printed.  I provided him education regarding Nivolumab and the indications of its use in cancer care.  I provided him education, again, on his malignancy, stage, and the terminality of his cancer.  It is re-iterated to him today that his cancer is incurable, but we have a few treatment option(s) left if he is interested.  His options include Cetuximab, Gemcitabine, Vinorelbine, and a few other single-agents that are well tolerated, which is important for him.  In choosing therapy options, we will need to be mindful of his CHF with decreased LVEF.    He continues to be in complete denial of his malignancy and its incurability.  His expectations are inappropriate and unrealistic.  Future Hospice intervention will be very difficult.  I admit in hindsight that I missed an opportunity to discuss code status, and I will work on that in the future.    Given the conversation today, I did not pursue therapy options with him.  Instead, he will start Radiation therapy tomorrow.  In approximately 10 day, he will return for an appointment to discuss future  treatment options systemically.  I believe that starting with radiation first and then discussing treatment options is in the patient's best interest to help him make solid decisions and to avoid a feeling of being overwhelmed with "too many moving parts."  He is agreeable to return in 7-10 days to discuss treatment options.  Hopefully some of his symptoms will improve by then and his frame of mind will be better.

## 2014-05-02 ENCOUNTER — Encounter: Payer: Self-pay | Admitting: Dietician

## 2014-05-02 NOTE — Progress Notes (Signed)
Patient identified to be at risk for malnutrition on the MST secondary to Wt loss and poor appetite  Contacted Pt by phone and spoke with wife   Wt Readings from Last 10 Encounters:  04/10/14 133 lb 3.2 oz (60.419 kg)  03/27/14 139 lb 3.2 oz (63.141 kg)  03/13/14 137 lb 9.6 oz (62.415 kg)  02/27/14 136 lb 8 oz (61.916 kg)  02/13/14 138 lb 3.2 oz (62.687 kg)  01/30/14 135 lb 6.4 oz (61.417 kg)  01/16/14 137 lb 3.2 oz (62.234 kg)  01/15/14 138 lb 6.4 oz (62.778 kg)  01/02/14 136 lb 14.4 oz (62.097 kg)  12/18/13 139 lb 3.2 oz (63.141 kg)   Patient weight has Decreased by about 7 lbs in 1 month.   Wife reports pt's oral intake as fair and is suffering from symptoms including loss of taste and decreased appeite  Wife states patient has received megace from doctor, but has only taken a few doses and is unable to determine if it is working or not. Right now wife reports pt eats 2 meals a day and sometimes a snack. She says the pt's biggest problem is his loss of taste; nothing has much flavor to him any more. Offered some suggestions such as using strong spices or tart/sweet fruits. Also, in light of weight loss discussed eating high calorie/protein foods.  Pt's wife was open to recommendations and thankful for the call.  Mailed my contact info, coupons, and handouts titled "Taste and Smell Changes" and "Increasing Calories and Protein"  Burtis Junes RD, LDN Nutrition Pager: [phone] 05/02/2014 11:03 AM

## 2014-05-08 ENCOUNTER — Encounter (HOSPITAL_COMMUNITY): Payer: Self-pay

## 2014-05-08 ENCOUNTER — Encounter (HOSPITAL_BASED_OUTPATIENT_CLINIC_OR_DEPARTMENT_OTHER): Payer: Managed Care, Other (non HMO)

## 2014-05-08 ENCOUNTER — Encounter (HOSPITAL_BASED_OUTPATIENT_CLINIC_OR_DEPARTMENT_OTHER): Payer: Managed Care, Other (non HMO) | Admitting: Oncology

## 2014-05-08 VITALS — BP 91/51 | HR 92 | Temp 99.1°F | Resp 20

## 2014-05-08 DIAGNOSIS — R600 Localized edema: Secondary | ICD-10-CM

## 2014-05-08 DIAGNOSIS — C801 Malignant (primary) neoplasm, unspecified: Secondary | ICD-10-CM | POA: Diagnosis present

## 2014-05-08 DIAGNOSIS — R7989 Other specified abnormal findings of blood chemistry: Secondary | ICD-10-CM

## 2014-05-08 DIAGNOSIS — R799 Abnormal finding of blood chemistry, unspecified: Secondary | ICD-10-CM

## 2014-05-08 DIAGNOSIS — R944 Abnormal results of kidney function studies: Secondary | ICD-10-CM | POA: Diagnosis not present

## 2014-05-08 DIAGNOSIS — Z85118 Personal history of other malignant neoplasm of bronchus and lung: Secondary | ICD-10-CM | POA: Diagnosis not present

## 2014-05-08 DIAGNOSIS — C799 Secondary malignant neoplasm of unspecified site: Secondary | ICD-10-CM

## 2014-05-08 DIAGNOSIS — C7951 Secondary malignant neoplasm of bone: Secondary | ICD-10-CM | POA: Diagnosis not present

## 2014-05-08 DIAGNOSIS — R918 Other nonspecific abnormal finding of lung field: Secondary | ICD-10-CM | POA: Diagnosis present

## 2014-05-08 LAB — COMPREHENSIVE METABOLIC PANEL
ALK PHOS: 51 U/L (ref 39–117)
ALT: 12 U/L (ref 0–53)
AST: 14 U/L (ref 0–37)
Albumin: 3 g/dL — ABNORMAL LOW (ref 3.5–5.2)
Anion gap: 8 (ref 5–15)
BUN: 51 mg/dL — AB (ref 6–23)
CO2: 21 mmol/L (ref 19–32)
Calcium: 9.6 mg/dL (ref 8.4–10.5)
Chloride: 106 mmol/L (ref 96–112)
Creatinine, Ser: 2.05 mg/dL — ABNORMAL HIGH (ref 0.50–1.35)
GFR calc Af Amer: 34 mL/min — ABNORMAL LOW (ref >90)
GFR, EST NON AFRICAN AMERICAN: 29 mL/min — AB (ref >90)
GLUCOSE: 132 mg/dL — AB (ref 70–99)
Potassium: 4.9 mmol/L (ref 3.5–5.1)
Sodium: 135 mmol/L (ref 135–145)
Total Bilirubin: 0.6 mg/dL (ref 0.3–1.2)
Total Protein: 7 g/dL (ref 6.0–8.3)

## 2014-05-08 LAB — CBC WITH DIFFERENTIAL/PLATELET
BASOS PCT: 0 % (ref 0–1)
Basophils Absolute: 0 10*3/uL (ref 0.0–0.1)
EOS ABS: 0.1 10*3/uL (ref 0.0–0.7)
EOS PCT: 1 % (ref 0–5)
HCT: 26.8 % — ABNORMAL LOW (ref 39.0–52.0)
HEMOGLOBIN: 8.9 g/dL — AB (ref 13.0–17.0)
Lymphocytes Relative: 5 % — ABNORMAL LOW (ref 12–46)
Lymphs Abs: 0.5 10*3/uL — ABNORMAL LOW (ref 0.7–4.0)
MCH: 29.1 pg (ref 26.0–34.0)
MCHC: 33.2 g/dL (ref 30.0–36.0)
MCV: 87.6 fL (ref 78.0–100.0)
MONO ABS: 0.5 10*3/uL (ref 0.1–1.0)
MONOS PCT: 4 % (ref 3–12)
Neutro Abs: 9.5 10*3/uL — ABNORMAL HIGH (ref 1.7–7.7)
Neutrophils Relative %: 90 % — ABNORMAL HIGH (ref 43–77)
Platelets: 278 10*3/uL (ref 150–400)
RBC: 3.06 MIL/uL — ABNORMAL LOW (ref 4.22–5.81)
RDW: 16.4 % — ABNORMAL HIGH (ref 11.5–15.5)
WBC: 10.6 10*3/uL — ABNORMAL HIGH (ref 4.0–10.5)

## 2014-05-08 LAB — BRAIN NATRIURETIC PEPTIDE: B NATRIURETIC PEPTIDE 5: 326 pg/mL — AB (ref 0.0–100.0)

## 2014-05-08 LAB — TSH: TSH: 2.97 u[IU]/mL (ref 0.350–4.500)

## 2014-05-08 MED ORDER — DENOSUMAB 120 MG/1.7ML ~~LOC~~ SOLN
120.0000 mg | Freq: Once | SUBCUTANEOUS | Status: AC
Start: 2014-05-08 — End: 2014-05-08
  Administered 2014-05-08: 120 mg via SUBCUTANEOUS
  Filled 2014-05-08: qty 1.7

## 2014-05-08 MED ORDER — FUROSEMIDE 20 MG PO TABS
20.0000 mg | ORAL_TABLET | Freq: Every day | ORAL | Status: DC
Start: 2014-05-08 — End: 2014-05-25

## 2014-05-08 NOTE — Patient Instructions (Signed)
[place] at [facility] Discharge Instructions  RECOMMENDATIONS MADE BY THE CONSULTANT AND ANY TEST RESULTS WILL BE SENT TO YOUR REFERRING PHYSICIAN.  Exam and discussion by Robynn Pane, PA-C Will prescribe lasix - take as directed.  Follow-up as scheduled.  Thank you for choosing [place] at [facility] to provide your oncology and hematology care.  To afford each patient quality time with our provider, please arrive at least 15 minutes before your scheduled appointment time.    You need to re-schedule your appointment should you arrive 10 or more minutes late.  We strive to give you quality time with our providers, and arriving late affects you and other patients whose appointments are after yours.  Also, if you no show three or more times for appointments you may be dismissed from the clinic at the providers discretion.     Again, thank you for choosing [facility].  Our hope is that these requests will decrease the amount of time that you wait before being seen by our physicians.       _____________________________________________________________  Should you have questions after your visit to [facility], please contact our office at [phone] between the hours of 8:30 a.m. and 4:30 p.m.  Voicemails left after 4:30 p.m. will not be returned until the following business day.  For prescription refill requests, have your pharmacy contact our office.

## 2014-05-08 NOTE — Progress Notes (Signed)
Labs drawn

## 2014-05-08 NOTE — Progress Notes (Signed)
..  Jeffrey [name] was seen by Caroleen Hamman PA today as a work in for c/o swelling in his ankles and generally not feeling well. [name] KitchenAlfonse [name] also  presented today for injection per the provider's orders.  xgeva administration without incident; see MAR for injection details.  Patient tolerated procedure well and without incident.  No questions or complaints noted at this time.

## 2014-05-08 NOTE — Progress Notes (Signed)
Patient is seen as a work-in today.  He has 3+ pitting edema in LE.  He was held in the clinic until lab work was reported.  I ordered CBC diff, CMET, and BNP.  His CMET demonstrates a ULN calcium and therefore, he will get his Delton See today.  His Renal function has increased some.  His albumin has declined.  His BNP is 326.  CBC is good, and Hgb is noted to be 8.9.  We will recheck it next week.  On exam, he has 3+ pitting edema B/L in feet and ankles.  He reports that it has been ongoing for 5-7 days.  He notes that it improved with ambulation.  NED.  Talking today.  He looks like he has lost some weight and is down about 5 lbs.  He is taking Megace 800 mg daily.  Umbilical hernia noted.  Left scapular lesion that hard and fixed.  Not much clinical change.   CBC    Component Value Date/Time   WBC 10.6* 05/08/2014 1215   RBC 3.06* 05/08/2014 1215   HGB 8.9* 05/08/2014 1215   HCT 26.8* 05/08/2014 1215   PLT 278 05/08/2014 1215   MCV 87.6 05/08/2014 1215   MCH 29.1 05/08/2014 1215   MCHC 33.2 05/08/2014 1215   RDW 16.4* 05/08/2014 1215   LYMPHSABS 0.5* 05/08/2014 1215   MONOABS 0.5 05/08/2014 1215   EOSABS 0.1 05/08/2014 1215   BASOSABS 0.0 05/08/2014 1215      Chemistry      Component Value Date/Time   NA 135 05/08/2014 1140   K 4.9 05/08/2014 1140   CL 106 05/08/2014 1140   CO2 21 05/08/2014 1140   BUN 51* 05/08/2014 1140   CREATININE 2.05* 05/08/2014 1140   CREATININE 1.46* 11/12/2012 0839      Component Value Date/Time   CALCIUM 9.6 05/08/2014 1140   CALCIUM 8.5 01/26/2012 1312   ALKPHOS 51 05/08/2014 1140   AST 14 05/08/2014 1140   ALT 12 05/08/2014 1140   BILITOT 0.6 05/08/2014 1140     BNP    Component Value Date/Time   BNP 326.0* 05/08/2014 1215    ProBNP    Component Value Date/Time   PROBNP 31079.0* 01/25/2012 1121     Assessment: 1. Mild heart failure 2. B/L LE edema 3. Increase in creatinine 4. Anemia  Plan: 1. Lasix 20 mg daily x 7 days. 2.  Return as scheduled for follow-up.  We will need to discuss his goals of care.  Patient and plan discussed with Dr. Ancil Linsey and she is in agreement with the aforementioned.   Daniel Johndrow 05/08/2014 1:47 PM

## 2014-05-10 NOTE — Progress Notes (Signed)
Jeffrey Hillier, MD Jeffrey [name] [id number]  Metastatic cancer  Pain passing urine - Plan: Urinalysis, Routine w reflex microscopic, Urinalysis, Routine w reflex microscopic  CURRENT THERAPY: Progressive metastatic squamous cell carcinoma, S/P Nivolumab failure.  INTERVAL HISTORY: Jeffrey [name] 81 y.o. 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      Metastatic cancer   02/17/2011 Imaging PET scan- B/L hypermetabolic pulmonary nodules (3 in total) are most consistent with metastatic disease. No evidence of mediastinal nodal metastasis.  No evidence of metastasis in the abdomen or pelvis.   06/19/2012 Imaging CT scan of chest- Slight enlargement of pulmonary nodules/metastasis.    07/31/2012 - 11/12/2012 Chemotherapy Carboplatin/Paclitaxel x 6 cycles   11/26/2012 Imaging CT CAP- Similar size of a R apical pulm nodule.  Similar to improved left lower lobe nodularity, favored to be post infectious or inflammatory. Similar nodularity in the right middle lobe. No evidence of new or progressive disease. Similar borderline adeno    12/10/2012 - 05/07/2013 Chemotherapy Tarceva 150 mg daily   05/03/2013 Progression CT CAP- Interval increase in size of right upper lobe adjacent pulmonary nodules is highly concerning for progression of lung cancer recurrence.   07/31/2013 - 04/10/2014 Chemotherapy Nivolumab   10/21/2013 Imaging CT CAP- The dominant right apical mass is slightly increased in size from previous exam. The adjacent nodule is stable.   10/31/2013 Imaging CT of head- Intermediate density subdural collection/hematoma along the convexity on the right, maximal thickness 7 mm. 2 mm of right-to-left shift. No hyper acute hemorrhage.  Lytic lesion right posterior frontal calvarium, new since the previous study.   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 Widespread underlying dural thickening and enhancement in the R hemisphere suspicious for meningeal involvement.   12/24/2013 Imaging CT CAP- No clear enlargement of right apical mass. Stable right middle lobe nodule. Mild mediastinal lymphadenopathy.   12/26/2013 Procedure Spinal CSF testing- negative for malignancy.   01/14/2014 Imaging Skeletal survey- Lytic lesion noted in the right frontoparietal region as noted on recent head CT of 10/31/2013. This is consist with metastatic disease. No other focal bony lesions noted.   03/25/2014 Imaging focal soft tissue mass 7.5 x 3.4 x 6.4 cm in size with associated small area of osseous destruction of the medial LEFT scapula. This most likely represents an osseous metastasis with a large extraosseous soft tissue component.   04/07/2014 Progression Soft Tissue Needle Core Biopsy, Left scapular and adjacent tissues - METASTATIC SQUAMOUS CELL CARCINOMA.   04/29/2014 -  Radiation Therapy XRT with Dr. Lisbeth Renshaw x 14 fractions.    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  The results are noted within this dictation.  [name] is having a difficult time understanding the chain of events that occurred resulting in the discontinuation of Nivolumab therapy.  He is having a difficult time understanding that the metastatic lesion to the left scapula is evidence of progressive disease.  He thinks it has been there all along, but clinically, it clearly enlarged in a short amount of time.  He is still stuck on the idea that we were giving him the incorrect chemotherapy resulting in Dr. Whitney Muse discontinuing the medication.  I've provided him  treatment options: 1. Erbitux 2. Xeloda 3. Erbitux + Xeloda 4. Navelbine 5. Hospice/palliative care.  He is most appropriate for #5 but he is denying his mortality.  I told him  very frankly and in terms that can easily be understood, that he will die of his cancer and he needs to begin to realize that his malignancy is bad and will cause his death at some time in the not too distant future.  He is educated that as we progress through lines of therapy, responses and duration of responses are diminished significantly.    Oncologically, he denies any complaints and ROS questioning is negative.  Past Medical History  Diagnosis Date  . Tracheostomy in place   . Cancer 2010    laryngeal  . Lung cancer 2010  . Pneumonia 2014  . Dysrhythmia     takes Coreg and Lisinopril daily  . Shortness of breath     with exertion  . Enlarged prostate     takes flomax  . Urinary urgency   . Gastric ulcer     hx of  . Hx of transfusion of packed red blood cells     no abnormal reaction noted by patient  . Umbilical hernia   . Metastatic cancer 07/24/2012  . Hyperlipidemia   . ED (erectile dysfunction)   . Insomnia   . CHF (congestive heart failure)   . 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     has No Known Allergies.  [name] does not currently have medications on file.  Past Surgical History  Procedure Laterality Date  . Tracheostomy  2010  . Lung removal, partial  2010    left side  . Layngectomy for laryngeal cancer in2010; left upper lobectomy for lung cancer, 2010.    . Back surgery      thinks it was done in 2012  . Esophagogastroduodenoscopy    . Colonoscopy    .  Portacath placement Bilateral 07/12/2012    Procedure: INSERTION PORT-A-CATH;  Surgeon: [name], MD;  Location: [facility] OR;  Service: Thoracic;  Laterality: Bilateral;    Denies any headaches, dizziness, double vision, fevers, chills, night sweats, nausea, vomiting, diarrhea, constipation, chest pain, heart palpitations, shortness of breath, blood in stool, black tarry stool, urinary pain, urinary burning, urinary frequency, hematuria.   PHYSICAL EXAMINATION  ECOG PERFORMANCE STATUS: 1 - Symptomatic but completely ambulatory  Filed Vitals:   05/12/14 1441  BP: 101/69  Pulse: 95  Temp: 98.7 F (37.1 C)  Resp: 20    GENERAL:alert, no distress, well nourished, well developed, comfortable, cooperative, smiling and accompanied by his wife. SKIN: skin color, texture, turgor are normal, no rashes or significant lesions HEAD: Normocephalic, No masses, lesions, tenderness or abnormalities EYES: normal, PERRLA, EOMI, Conjunctiva are pink and non-injected EARS: External ears normal OROPHARYNX:lips, buccal mucosa, and tongue normal and mucous membranes are moist  NECK: supple, trachea midline LYMPH:  not examined BREAST:not examined LUNGS: not examined HEART: not examined ABDOMEN:not examined BACK: Back symmetric, no curvature. EXTREMITIES:less then 2 second capillary refill, no skin discoloration, no cyanosis  NEURO: alert & oriented x 3 with fluent speech, no focal motor/sensory deficits, gait normal   LABORATORY DATA: CBC    Component Value Date/Time   WBC 10.6* 05/08/2014 1215   RBC 3.06* 05/08/2014 1215   HGB 8.9* 05/08/2014 1215   HCT 26.8* 05/08/2014 1215   PLT 278 05/08/2014 1215   MCV 87.6 05/08/2014 1215   MCH 29.1 05/08/2014 1215   MCHC 33.2 05/08/2014 1215   RDW 16.4* 05/08/2014 1215   LYMPHSABS 0.5* 05/08/2014 1215   MONOABS 0.5 05/08/2014 1215   EOSABS 0.1 05/08/2014 1215   BASOSABS 0.0 05/08/2014 1215      Chemistry      Component Value Date/Time   NA  135 05/08/2014 1140   K 4.9 05/08/2014 1140   CL 106 05/08/2014 1140   CO2 21 05/08/2014 1140   BUN 51* 05/08/2014 1140   CREATININE 2.05* 05/08/2014 1140   CREATININE 1.46* 11/12/2012 0839      Component Value Date/Time   CALCIUM 9.6 05/08/2014 1140   CALCIUM 8.5 01/26/2012 1312   ALKPHOS 51 05/08/2014 1140   AST 14 05/08/2014 1140   ALT 12 05/08/2014 1140   BILITOT 0.6 05/08/2014 1140        ASSESSMENT AND PLAN:  Metastatic cancer Progressive squamous cell carcinoma of head and neck with failure in therapy with Nivolumab.  Currently undergoing palliative radiation to left scapular lesion.  Long discussion regarding treatment options including single-agent Erbitux, single-agent Xeloda, Combination therapy with Erbitux and Xeloda, single-agent Navelbine, and Hospice.  I have printed off patient information regarding each treatment for him to consider.  He would be best served with Hospice, but Osa is experiencing a difficult time facing his mortality and his cancer.  In no confusing way, I re-enforced the fact that he will die of his cancer, further treatment options typically do not work as well, and at some point, his malignancy will cause his demise.  He would like to consider his options, it is clear that he is only interested in further treatment.  He wants to ready about the proposed treatments.  He will return in 7-10 days for follow-up and discussion regarding his treatment decision(s).   THERAPY PLAN:  He will consider treatment options and we will re-convene in 1-2 weeks for discussion of his wishes.  All questions were answered. The patient knows to call the clinic with any problems, questions or concerns. We can certainly see the patient much sooner if necessary.  Patient and plan discussed with Dr. Ancil Linsey and she is in agreement with the aforementioned.   This note is electronically signed by: Robynn Pane 05/12/2014 4:24 PM

## 2014-05-10 NOTE — Assessment & Plan Note (Addendum)
Progressive squamous cell carcinoma of head and neck with failure in therapy with Nivolumab.  Currently undergoing palliative radiation to left scapular lesion.  Long discussion regarding treatment options including single-agent Erbitux, single-agent Xeloda, Combination therapy with Erbitux and Xeloda, single-agent Navelbine, and Hospice.  I have printed off patient information regarding each treatment for him to consider.  He would be best served with Hospice, but Donielle is experiencing a difficult time facing his mortality and his cancer.  In no confusing way, I re-enforced the fact that he will die of his cancer, further treatment options typically do not work as well, and at some point, his malignancy will cause his demise.  He would like to consider his options, it is clear that he is only interested in further treatment.  He wants to ready about the proposed treatments.  He will return in 7-10 days for follow-up and discussion regarding his treatment decision(s).

## 2014-05-12 ENCOUNTER — Other Ambulatory Visit (HOSPITAL_COMMUNITY): Payer: Medicare Other

## 2014-05-12 ENCOUNTER — Encounter (HOSPITAL_COMMUNITY): Payer: Self-pay | Admitting: Oncology

## 2014-05-12 ENCOUNTER — Encounter (HOSPITAL_BASED_OUTPATIENT_CLINIC_OR_DEPARTMENT_OTHER): Payer: Managed Care, Other (non HMO) | Admitting: Oncology

## 2014-05-12 VITALS — BP 101/69 | HR 95 | Temp 98.7°F | Resp 20 | Wt 133.1 lb

## 2014-05-12 DIAGNOSIS — Z85118 Personal history of other malignant neoplasm of bronchus and lung: Secondary | ICD-10-CM | POA: Diagnosis not present

## 2014-05-12 DIAGNOSIS — R309 Painful micturition, unspecified: Secondary | ICD-10-CM

## 2014-05-12 DIAGNOSIS — C801 Malignant (primary) neoplasm, unspecified: Secondary | ICD-10-CM | POA: Diagnosis not present

## 2014-05-12 DIAGNOSIS — C799 Secondary malignant neoplasm of unspecified site: Secondary | ICD-10-CM

## 2014-05-12 LAB — URINALYSIS, ROUTINE W REFLEX MICROSCOPIC
Bilirubin Urine: NEGATIVE
GLUCOSE, UA: NEGATIVE mg/dL
Ketones, ur: NEGATIVE mg/dL
Nitrite: NEGATIVE
PROTEIN: NEGATIVE mg/dL
Specific Gravity, Urine: 1.015 (ref 1.005–1.030)
Urobilinogen, UA: 0.2 mg/dL (ref 0.0–1.0)
pH: 6 (ref 5.0–8.0)

## 2014-05-12 LAB — URINE MICROSCOPIC-ADD ON

## 2014-05-12 NOTE — Patient Instructions (Signed)
[place] at [facility]  Discharge Instructions:  Treatment options 1. Erbitux 2. Xeloda 1000 mg/m2 twice daily 7 days on and 7 days off 3. Combination of Erbitux and Xeloda 4. Navelbine alone 5. Palliative/Hospice care  Return in 7-10 days for follow-up appointment. _______________________________________________________________  Thank you for choosing [place] at [facility] to provide your oncology and hematology care.  To afford each patient quality time with our providers, please arrive at least 15 minutes before your scheduled appointment.  You need to re-schedule your appointment if you arrive 10 or more minutes late.  We strive to give you quality time with our providers, and arriving late affects you and other patients whose appointments are after yours.  Also, if you no show three or more times for appointments you may be dismissed from the clinic.  Again, thank you for choosing [place] at [place] hope is that these requests will allow you access to exceptional care and in a timely manner. _______________________________________________________________  If you have questions after your visit, please contact our office at [phone] between the hours of 8:30 a.m. and 5:00 p.m. Voicemails left after 4:30 p.m. will not be returned until the following business day. _______________________________________________________________  For prescription refill requests, have your pharmacy contact our office. _______________________________________________________________  Recommendations made by the consultant and any test results will be sent to your referring physician. _______________________________________________________________

## 2014-05-13 ENCOUNTER — Other Ambulatory Visit (HOSPITAL_COMMUNITY): Payer: Self-pay | Admitting: Oncology

## 2014-05-13 DIAGNOSIS — R82998 Other abnormal findings in urine: Secondary | ICD-10-CM

## 2014-05-13 MED ORDER — NITROFURANTOIN MONOHYD MACRO 100 MG PO CAPS: 100.0000 mg | ORAL_CAPSULE | Freq: Two times a day (BID) | ORAL | Status: AC

## 2014-05-16 ENCOUNTER — Telehealth: Payer: Self-pay | Admitting: Family Medicine

## 2014-05-16 ENCOUNTER — Other Ambulatory Visit: Payer: Self-pay | Admitting: *Deleted

## 2014-05-16 ENCOUNTER — Telehealth: Payer: Self-pay | Admitting: *Deleted

## 2014-05-16 MED ORDER — CARVEDILOL 3.125 MG PO TABS: 3.1250 mg | ORAL_TABLET | Freq: Two times a day (BID) | ORAL | Status: DC

## 2014-05-16 NOTE — Telephone Encounter (Signed)
Notified patient 30 day supply sent to pharmacy and patient was transferred to front desk to schedule appointment.

## 2014-05-16 NOTE — Telephone Encounter (Signed)
Med sent to pharm 

## 2014-05-16 NOTE — Telephone Encounter (Signed)
Give him 90 d rx

## 2014-05-16 NOTE — Telephone Encounter (Signed)
carvedilol (COREG) 3.125 MG tablet  Pt is completely out of this med if we could send it  In sooner rather than later  Please send to [facility]

## 2014-05-16 NOTE — Telephone Encounter (Signed)
Last visit for shoulder pain 12/2013. Last check up 08/2013. Pt was refused 90 day supply of coreg bc he needs ov. Pt states ins will only pay for 90 day. Has appt on may 20th.

## 2014-05-16 NOTE — Addendum Note (Signed)
Addended by: Jesusita Oka on: 05/16/2014 09:57 AM   Modules accepted: Orders

## 2014-05-16 NOTE — Addendum Note (Signed)
Addended by: Jesusita Oka on: 05/16/2014 10:11 AM   Modules accepted: Orders

## 2014-05-20 ENCOUNTER — Emergency Department (HOSPITAL_COMMUNITY): Payer: Managed Care, Other (non HMO)

## 2014-05-20 ENCOUNTER — Other Ambulatory Visit (HOSPITAL_COMMUNITY): Payer: Self-pay

## 2014-05-20 ENCOUNTER — Inpatient Hospital Stay (HOSPITAL_COMMUNITY)
Admission: EM | Admit: 2014-05-20 | Discharge: 2014-05-25 | DRG: 871 | Disposition: A | Discharge status: Home / Self Care | Payer: Managed Care, Other (non HMO) | Attending: Internal Medicine | Admitting: Internal Medicine

## 2014-05-20 ENCOUNTER — Encounter (HOSPITAL_COMMUNITY): Payer: Self-pay

## 2014-05-20 DIAGNOSIS — I10 Essential (primary) hypertension: Secondary | ICD-10-CM | POA: Diagnosis present

## 2014-05-20 DIAGNOSIS — Z6821 Body mass index (BMI) 21.0-21.9, adult: Secondary | ICD-10-CM | POA: Diagnosis not present

## 2014-05-20 DIAGNOSIS — C329 Malignant neoplasm of larynx, unspecified: Secondary | ICD-10-CM | POA: Diagnosis present

## 2014-05-20 DIAGNOSIS — C7951 Secondary malignant neoplasm of bone: Secondary | ICD-10-CM | POA: Diagnosis present

## 2014-05-20 DIAGNOSIS — C801 Malignant (primary) neoplasm, unspecified: Secondary | ICD-10-CM

## 2014-05-20 DIAGNOSIS — Z8249 Family history of ischemic heart disease and other diseases of the circulatory system: Secondary | ICD-10-CM | POA: Diagnosis not present

## 2014-05-20 DIAGNOSIS — R319 Hematuria, unspecified: Secondary | ICD-10-CM | POA: Diagnosis not present

## 2014-05-20 DIAGNOSIS — C3490 Malignant neoplasm of unspecified part of unspecified bronchus or lung: Secondary | ICD-10-CM | POA: Diagnosis present

## 2014-05-20 DIAGNOSIS — E876 Hypokalemia: Secondary | ICD-10-CM | POA: Diagnosis present

## 2014-05-20 DIAGNOSIS — R509 Fever, unspecified: Secondary | ICD-10-CM | POA: Diagnosis not present

## 2014-05-20 DIAGNOSIS — N1330 Unspecified hydronephrosis: Secondary | ICD-10-CM | POA: Diagnosis present

## 2014-05-20 DIAGNOSIS — B370 Candidal stomatitis: Secondary | ICD-10-CM | POA: Diagnosis present

## 2014-05-20 DIAGNOSIS — I509 Heart failure, unspecified: Secondary | ICD-10-CM | POA: Diagnosis present

## 2014-05-20 DIAGNOSIS — D649 Anemia, unspecified: Secondary | ICD-10-CM

## 2014-05-20 DIAGNOSIS — E785 Hyperlipidemia, unspecified: Secondary | ICD-10-CM | POA: Diagnosis present

## 2014-05-20 DIAGNOSIS — I9589 Other hypotension: Secondary | ICD-10-CM

## 2014-05-20 DIAGNOSIS — R6521 Severe sepsis with septic shock: Secondary | ICD-10-CM | POA: Diagnosis present

## 2014-05-20 DIAGNOSIS — R7881 Bacteremia: Secondary | ICD-10-CM

## 2014-05-20 DIAGNOSIS — Z930 Tracheostomy status: Secondary | ICD-10-CM | POA: Diagnosis not present

## 2014-05-20 DIAGNOSIS — E872 Acidosis: Secondary | ICD-10-CM | POA: Diagnosis present

## 2014-05-20 DIAGNOSIS — D508 Other iron deficiency anemias: Secondary | ICD-10-CM | POA: Diagnosis not present

## 2014-05-20 DIAGNOSIS — E8729 Other acidosis: Secondary | ICD-10-CM

## 2014-05-20 DIAGNOSIS — Z833 Family history of diabetes mellitus: Secondary | ICD-10-CM | POA: Diagnosis not present

## 2014-05-20 DIAGNOSIS — D519 Vitamin B12 deficiency anemia, unspecified: Secondary | ICD-10-CM | POA: Diagnosis present

## 2014-05-20 DIAGNOSIS — E43 Unspecified severe protein-calorie malnutrition: Secondary | ICD-10-CM | POA: Diagnosis present

## 2014-05-20 DIAGNOSIS — N390 Urinary tract infection, site not specified: Secondary | ICD-10-CM | POA: Diagnosis present

## 2014-05-20 DIAGNOSIS — R338 Other retention of urine: Secondary | ICD-10-CM

## 2014-05-20 DIAGNOSIS — N179 Acute kidney failure, unspecified: Secondary | ICD-10-CM | POA: Diagnosis present

## 2014-05-20 DIAGNOSIS — A419 Sepsis, unspecified organism: Principal | ICD-10-CM | POA: Diagnosis present

## 2014-05-20 DIAGNOSIS — F17210 Nicotine dependence, cigarettes, uncomplicated: Secondary | ICD-10-CM | POA: Diagnosis present

## 2014-05-20 DIAGNOSIS — C799 Secondary malignant neoplasm of unspecified site: Secondary | ICD-10-CM

## 2014-05-20 DIAGNOSIS — B9689 Other specified bacterial agents as the cause of diseases classified elsewhere: Secondary | ICD-10-CM | POA: Diagnosis present

## 2014-05-20 LAB — CBC WITH DIFFERENTIAL/PLATELET
Basophils Absolute: 0 10*3/uL (ref 0.0–0.1)
Basophils Relative: 0 % (ref 0–1)
Eosinophils Absolute: 0 10*3/uL (ref 0.0–0.7)
Eosinophils Relative: 0 % (ref 0–5)
HCT: 25.2 % — ABNORMAL LOW (ref 39.0–52.0)
Hemoglobin: 8.4 g/dL — ABNORMAL LOW (ref 13.0–17.0)
Lymphocytes Relative: 1 % — ABNORMAL LOW (ref 12–46)
Lymphs Abs: 0.2 10*3/uL — ABNORMAL LOW (ref 0.7–4.0)
MCH: 29.4 pg (ref 26.0–34.0)
MCHC: 33.3 g/dL (ref 30.0–36.0)
MCV: 88.1 fL (ref 78.0–100.0)
Monocytes Absolute: 0.1 10*3/uL (ref 0.1–1.0)
Monocytes Relative: 1 % — ABNORMAL LOW (ref 3–12)
Neutro Abs: 11.8 10*3/uL — ABNORMAL HIGH (ref 1.7–7.7)
Neutrophils Relative %: 98 % — ABNORMAL HIGH (ref 43–77)
Platelets: 259 10*3/uL (ref 150–400)
RBC: 2.86 MIL/uL — ABNORMAL LOW (ref 4.22–5.81)
RDW: 16.6 % — ABNORMAL HIGH (ref 11.5–15.5)
WBC: 12.1 10*3/uL — ABNORMAL HIGH (ref 4.0–10.5)

## 2014-05-20 LAB — TYPE AND SCREEN
ABO/RH(D): AB POS
Antibody Screen: NEGATIVE

## 2014-05-20 LAB — COMPREHENSIVE METABOLIC PANEL
ALT: 11 U/L — ABNORMAL LOW (ref 17–63)
AST: 12 U/L — ABNORMAL LOW (ref 15–41)
Albumin: 2.8 g/dL — ABNORMAL LOW (ref 3.5–5.0)
Alkaline Phosphatase: 50 U/L (ref 38–126)
Anion gap: 9 (ref 5–15)
BUN: 50 mg/dL — ABNORMAL HIGH (ref 6–20)
CO2: 19 mmol/L — ABNORMAL LOW (ref 22–32)
Calcium: 8.1 mg/dL — ABNORMAL LOW (ref 8.9–10.3)
Chloride: 106 mmol/L (ref 101–111)
Creatinine, Ser: 2.01 mg/dL — ABNORMAL HIGH (ref 0.61–1.24)
GFR calc Af Amer: 35 mL/min — ABNORMAL LOW (ref >60)
GFR calc non Af Amer: 30 mL/min — ABNORMAL LOW (ref >60)
Glucose, Bld: 99 mg/dL (ref 70–99)
Potassium: 5.5 mmol/L — ABNORMAL HIGH (ref 3.5–5.1)
Sodium: 134 mmol/L — ABNORMAL LOW (ref 135–145)
Total Bilirubin: 0.7 mg/dL (ref 0.3–1.2)
Total Protein: 7 g/dL (ref 6.5–8.1)

## 2014-05-20 LAB — URINALYSIS, ROUTINE W REFLEX MICROSCOPIC
Bilirubin Urine: NEGATIVE
Glucose, UA: NEGATIVE mg/dL
Ketones, ur: NEGATIVE mg/dL
Nitrite: NEGATIVE
Specific Gravity, Urine: 1.02 (ref 1.005–1.030)
Urobilinogen, UA: 0.2 mg/dL (ref 0.0–1.0)
pH: 5.5 (ref 5.0–8.0)

## 2014-05-20 LAB — URINE MICROSCOPIC-ADD ON

## 2014-05-20 LAB — I-STAT CG4 LACTIC ACID, ED: Lactic Acid, Venous: 1.06 mmol/L (ref 0.5–2.0)

## 2014-05-20 MED ORDER — NOREPINEPHRINE BITARTRATE 1 MG/ML IV SOLN
INTRAVENOUS | Status: AC
  Filled 2014-05-20: qty 4

## 2014-05-20 MED ORDER — ACETAMINOPHEN 325 MG PO TABS
650.0000 mg | ORAL_TABLET | Freq: Once | ORAL | Status: AC
  Administered 2014-05-20: 650 mg via ORAL
  Filled 2014-05-20: qty 2

## 2014-05-20 MED ORDER — SODIUM CHLORIDE 0.9 % IV BOLUS (SEPSIS)
1000.0000 mL | Freq: Once | INTRAVENOUS | Status: AC
  Administered 2014-05-20: 1000 mL via INTRAVENOUS

## 2014-05-20 MED ORDER — VANCOMYCIN HCL 10 G IV SOLR
1250.0000 mg | Freq: Once | INTRAVENOUS | Status: AC
  Administered 2014-05-20: 1250 mg via INTRAVENOUS
  Filled 2014-05-20: qty 1250

## 2014-05-20 MED ORDER — VANCOMYCIN HCL 10 G IV SOLR: 1250.0000 mg | Freq: Once | INTRAVENOUS | Status: DC

## 2014-05-20 MED ORDER — SODIUM CHLORIDE 0.9 % IV BOLUS (SEPSIS)
1000.0000 mL | Freq: Once | INTRAVENOUS | Status: AC
  Administered 2014-05-21: 1000 mL via INTRAVENOUS

## 2014-05-20 MED ORDER — DOPAMINE-DEXTROSE 3.2-5 MG/ML-% IV SOLN
INTRAVENOUS | Status: DC
Start: 2014-05-20 — End: 2014-05-20
  Filled 2014-05-20: qty 250

## 2014-05-20 MED ORDER — PIPERACILLIN-TAZOBACTAM 3.375 G IVPB 30 MIN
3.3750 g | Freq: Once | INTRAVENOUS | Status: AC
  Administered 2014-05-20: 3.375 g via INTRAVENOUS
  Filled 2014-05-20: qty 50

## 2014-05-20 MED ORDER — DEXTROSE-NACL 5-0.9 % IV SOLN
INTRAVENOUS | Status: DC
  Administered 2014-05-21 (×2): via INTRAVENOUS

## 2014-05-20 MED ORDER — NOREPINEPHRINE BITARTRATE 1 MG/ML IV SOLN
0.0000 ug/min | INTRAVENOUS | Status: DC
  Administered 2014-05-21: 15 ug/min via INTRAVENOUS
  Filled 2014-05-20: qty 4

## 2014-05-20 NOTE — ED Notes (Signed)
Pts family reports that he woke up this morning not feeling well and just wasn't as active as normal.  States that this evening had copious amt of bright red blood from his penis.  Pt is nonverbal and has been receiving radiation to a mass on left shoulder.  Pt grimaces with pain when touched.

## 2014-05-20 NOTE — ED Notes (Signed)
Pt assisted to side of bed and urine specimen obtained and sent to lab

## 2014-05-20 NOTE — ED Provider Notes (Signed)
CSN: [id number]     Arrival date & time 05/20/14  1757 History   First MD Initiated Contact with Patient 05/20/14 1823     Chief Complaint  Patient presents with  . Hematuria  . Fever     (Consider location/radiation/quality/duration/timing/severity/associated sxs/prior Treatment) HPI   81y M with hx metastatic squamous cell carcinoma of the lung versus head and neck currently undergoing palliative radiation presenting with decreased mental status and hematuria. Pt recently diagnosed with UTI on 4/25 and started on macrobid. Seemed to be doing fairly well otherwise up until today. Yesterday was shopping with wife.  Today was found by wife with decreased mental status when she came home from work. Gross hematuria. Felt warm to touch.  Pt unable to provide much additional useful history.    Past Medical History  Diagnosis Date  . Tracheostomy in place   . Cancer 2010    laryngeal  . Lung cancer 2010  . Pneumonia 2014  . Dysrhythmia     takes Coreg and Lisinopril daily  . Shortness of breath     with exertion  . Enlarged prostate     takes flomax  . Urinary urgency   . Gastric ulcer     hx of  . Hx of transfusion of packed red blood cells     no abnormal reaction noted by patient  . Umbilical hernia   . Metastatic cancer 07/24/2012  . Hyperlipidemia   . ED (erectile dysfunction)   . Insomnia   . CHF (congestive heart failure)   . 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 Tracheostomy  2010  . Lung removal, partial  2010    left side  . Layngectomy for laryngeal cancer in2010; left upper lobectomy for lung cancer, 2010.    . Back surgery      thinks it was done in 2012  . Esophagogastroduodenoscopy    . Colonoscopy    . Portacath placement Bilateral 07/12/2012    Procedure: INSERTION PORT-A-CATH;  Surgeon: [name], MD;  Location: [facility] OR;  Service: Thoracic;  Laterality: Bilateral;   Family  History  Problem Relation Age of Onset  . Diabetes Father   . Heart disease Father    History  Substance Use Topics  . Smoking status: Current Every Day Smoker -- 1.00 packs/day for 70 years    Types: Cigarettes  . Smokeless tobacco: Never Used  . Alcohol Use: No    Review of Systems  Level 5 caveat because of encephalopathy.   Allergies  Review of patient's allergies indicates no known allergies.  Home Medications   Prior to Admission medications   Medication Sig Start Date End Date Taking? Authorizing Provider  Calcium Carb-Cholecalciferol (CALCIUM-VITAMIN D3) 600-500 MG-UNIT CAPS Take 1 capsule by mouth 3 (three) times daily with meals. 03/27/14  Yes Patrici Ranks, MD  calcium carbonate (TUMS - DOSED IN MG ELEMENTAL CALCIUM) 500 MG chewable tablet Chew 1 tablet by mouth daily. 1000 mg   Yes Historical Provider, MD  carvedilol (COREG) 3.125 MG tablet Take 1 tablet (3.125 mg total) by mouth 2 (two) times daily with a meal. 05/16/14  Yes Mikey Kirschner, MD  esomeprazole (NEXIUM) 20 MG capsule Take 20 mg by mouth daily as needed (indigestion).    Yes Historical Provider, MD  furosemide (LASIX) 20 MG tablet Take 1 tablet (20 mg total) by mouth daily. 05/08/14  Yes Baird Cancer,  PA-C  HYDROcodone-acetaminophen (NORCO) 10-325 MG per tablet Take 1 tablet by mouth every 6 (six) hours as needed. 04/28/14  Yes Baird Cancer, PA-C  levothyroxine (SYNTHROID) 50 MCG tablet Take 2 tablets (100 mcg total) by mouth daily before breakfast. 02/18/14  Yes Baird Cancer, PA-C  lidocaine-prilocaine (EMLA) cream Apply 1 application topically as needed. Apply to port 1 hour prior to accessing   Yes Historical Provider, MD  lisinopril (PRINIVIL,ZESTRIL) 5 MG tablet TAKE ONE TABLET BY MOUTH ONCE DAILY 03/24/14  Yes Mikey Kirschner, MD  LORazepam (ATIVAN) 1 MG tablet TAKE ONE TABLET BY MOUTH AT BEDTIME AS NEEDED FOR ANXIETY 03/24/14  Yes Mikey Kirschner, MD  megestrol (MEGACE) 400 MG/10ML  suspension Take 20 mLs (800 mg total) by mouth daily. 04/28/14  Yes Baird Cancer, PA-C  nitrofurantoin, macrocrystal-monohydrate, (MACROBID) 100 MG capsule Take 1 capsule (100 mg total) by mouth 2 (two) times daily. 05/13/14 05/20/14 Yes Manon Hilding Kefalas, PA-C  ondansetron (ZOFRAN) 8 MG tablet Take by mouth every 8 (eight) hours as needed for nausea or vomiting. Starting day of chemo as needed   Yes Historical Provider, MD  terazosin (HYTRIN) 5 MG capsule Take 1 or 2 capsules at bedtime for urinary hesitancy. 07/25/13  Yes Farrel Gobble, MD   BP 75/48 mmHg  Pulse 99  Temp(Src) 102.4 F (39.1 C) (Rectal)  Resp 25  Ht '5\' 6"'$  (1.676 m)  Wt 132 lb (59.875 kg)  BMI 21.32 kg/m2  SpO2 95% Physical Exam  Constitutional:  Frail and chronically ill appearing. Laying in bed with eyes closed. Will open eyes to voice and light tactile stimulation.   HENT:  Head: Normocephalic and atraumatic.  tracheostomy  Eyes: Conjunctivae are normal. Right eye exhibits no discharge. Left eye exhibits no discharge.  Neck: Neck supple.  Cardiovascular: Regular rhythm and normal heart sounds.  Exam reveals no gallop and no friction rub.   No murmur heard. tachycardic  Pulmonary/Chest: Effort normal and breath sounds normal. No respiratory distress.  Abdominal: Soft. He exhibits no distension. There is tenderness.  Umbilical hernia. Suprapubic tenderness w/o rebound or guarding.   Musculoskeletal: He exhibits no edema or tenderness.  Skin: Skin is warm and dry.  Nursing note and vitals reviewed.   ED Course  Procedures (including critical care time)  CRITICAL CARE Performed by: Virgel Manifold Total critical care time: 35 minutes Critical care time was exclusive of separately billable procedures and treating other patients. Critical care was necessary to treat or prevent imminent or life-threatening deterioration. Critical care was time spent personally by me on the following activities: development of  treatment plan with patient and/or surrogate as well as nursing, discussions with consultants, evaluation of patient's response to treatment, examination of patient, obtaining history from patient or surrogate, ordering and performing treatments and interventions, ordering and review of laboratory studies, ordering and review of radiographic studies, pulse oximetry and re-evaluation of patient's condition.  Labs Review Labs Reviewed  COMPREHENSIVE METABOLIC PANEL - Abnormal; Notable for the following:    Sodium 134 (*)    Potassium 5.5 (*)    CO2 19 (*)    BUN 50 (*)    Creatinine, Ser 2.01 (*)    Calcium 8.1 (*)    Albumin 2.8 (*)    AST 12 (*)    ALT 11 (*)    GFR calc non Af Amer 30 (*)    GFR calc Af Amer 35 (*)    All other components within normal limits  CBC WITH DIFFERENTIAL/PLATELET - Abnormal; Notable for the following:    WBC 12.1 (*)    RBC 2.86 (*)    Hemoglobin 8.4 (*)    HCT 25.2 (*)    RDW 16.6 (*)    Neutrophils Relative % 98 (*)    Neutro Abs 11.8 (*)    Lymphocytes Relative 1 (*)    Lymphs Abs 0.2 (*)    Monocytes Relative 1 (*)    All other components within normal limits  CULTURE, BLOOD (ROUTINE X 2)  CULTURE, BLOOD (ROUTINE X 2)  URINE CULTURE  URINALYSIS, ROUTINE W REFLEX MICROSCOPIC  I-STAT CG4 LACTIC ACID, ED    Imaging Review Dg Chest 2 View  05/20/2014   CLINICAL DATA:  81 year old male with fever and weakness. 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 Patient currently on chemotherapy.  EXAM: CHEST  2 VIEW  COMPARISON:  Prior CT scan of the chest abdomen and pelvis 03/25/2014  FINDINGS: Stable cardiomegaly. Right subclavian approach single-lumen power injectable port catheter. Catheter tip overlies the distal SVC. Atherosclerotic calcification present in the transverse aorta.  No focal airspace consolidation, pleural effusion or pneumothorax. Stable pleural parenchymal scarring in the right apex and medial aspect of the left upper lobe.  Surgical changes of prior left lower lobectomy.  Prior resection of the left seventh rib. Healed fractures of the left eighth and ninth ribs. Destructive lesion in the medial aspect of the left scapula. This abnormality was better demonstrated on the prior CT scan. No acute spine fracture. Chronic blunting of the left costophrenic angle.  IMPRESSION: 1. Stable chest x-ray without evidence of acute cardiopulmonary process. 2. Loss of the medial border of the left scapula consistent with known osseous metastasis. 3. Surgical changes of prior left lower lobectomy.   Electronically Signed   By: Jacqulynn Cadet M.D.   On: 05/20/2014 19:34     EKG Interpretation None      MDM   Final diagnoses:  Sepsis, due to unspecified organism  UTI (lower urinary tract infection)  AKI (acute kidney injury)  Other specified hypotension  Metastatic cancer   80yM with fever, fatigue, hematuria. Likely urosepsis. UA on 4/25 consistent with UTI. Completed 5 day course of macrobid. No culture data for review. Mild suprapubic tenderness on exam.  Hypotensive. Pt appears to typically run on lower side, but not to this degree. IVF. Empiric Abx. Lactic acid is normal. Anemic, but only mild drop from 8.9 12 days ago to 8/4 today. No blood thinners. Will send type and screen. Critically ill and requires hospitalization.      Vitals - 1 value per visit 05/12/2014 05/08/2014 04/10/2014 7/51/0258  SYSTOLIC [phone]  DIASTOLIC 69 51 71 71   Vitals - 1 value per visit 5/36/1443  SYSTOLIC 154  DIASTOLIC 66       Virgel Manifold, MD 05/20/14 2230

## 2014-05-20 NOTE — H&P (Signed)
[facility] History and Physical  Jeffrey [name] IOE:[id number] DOB: 1933-06-19    PCP:   [name], MD   Chief Complaint: hematuria, fever, chills.   HPI: Jeffrey [name] is an 81 y.o. male with hx of laryngeal cancer, s/p laryngectomy with ostomy, hx of metastatic lung cancer with bony mets, CHF, HTN, lives at home with wife, presented to the ER with hematuria, chills, and malaise.  In the ER, he was found to be hypotensive, with SBP in the 60's.  He was given 2 L of IVF, and his SBP went up to 79 systolic.  He was able to converse and has no diaphoresis.  Work up included a CXR showing no infiltrates, Cr of 2.01 with BUN 50's.  His Hb is 8.4 grams per dL, and WBC of 12K.  His K was slightly elevated at 5.5 mE/L.  His UA showed TNTC WBCs and after blood and urine cultures obtained, he was started on IV Lucianne Lei and Zosyn, and hospitalist was asked to admit him for sepsis due to UTI. His lactic acid is normal.  Rewiew of Systems:  Constitutional: Negative for malaise, fever and chills. No significant weight loss or weight gain Eyes: Negative for eye pain, redness and discharge, diplopia, visual changes, or flashes of light. ENMT: Negative for ear pain, hoarseness, nasal congestion, sinus pressure and sore throat. No headaches; tinnitus, drooling, or problem swallowing. Cardiovascular: Negative for chest pain, palpitations, diaphoresis, dyspnea and peripheral edema. ; No orthopnea, PND Respiratory: Negative for cough, hemoptysis, wheezing and stridor. No pleuritic chestpain. Gastrointestinal: Negative for nausea, vomiting, diarrhea, constipation, abdominal pain, melena, blood in stool, hematemesis, jaundice and rectal bleeding.    Genitourinary: Negative for frequency, dysuria, incontinence,flank pain  Musculoskeletal: Negative for back pain and neck pain. Negative for swelling and trauma.;  Skin: . Negative for pruritus, rash, abrasions, bruising and skin lesion.; ulcerations Neuro:  Negative for headache, lightheadedness and neck stiffness. Negative for weakness, altered level of consciousness , altered mental status, extremity weakness, burning feet, involuntary movement, seizure and syncope.  Psych: negative for anxiety, depression, insomnia, tearfulness, panic attacks, hallucinations, paranoia, suicidal or homicidal ideation    Past Medical History  Diagnosis Date  . Tracheostomy in place   . Cancer 2010    laryngeal  . Lung cancer 2010  . Pneumonia 2014  . Dysrhythmia     takes Coreg and Lisinopril daily  . Shortness of breath     with exertion  . Enlarged prostate     takes flomax  . Urinary urgency   . Gastric ulcer     hx of  . Hx of transfusion of packed red blood cells     no abnormal reaction noted by patient  . Umbilical hernia   . Metastatic cancer 07/24/2012  . Hyperlipidemia   . ED (erectile dysfunction)   . Insomnia   . CHF (congestive heart failure)   . 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 Tracheostomy  2010  . Lung removal, partial  2010    left side  . Layngectomy for laryngeal cancer in2010; left upper lobectomy for lung cancer, 2010.    . Back surgery      thinks it was done in 2012  . Esophagogastroduodenoscopy    . Colonoscopy    . Portacath placement Bilateral 07/12/2012    Procedure: INSERTION PORT-A-CATH;  Surgeon: [name], MD;  Location: [facility];  Service: Thoracic;  Laterality: Bilateral;    Medications:  HOME MEDS: Prior to Admission medications   Medication Sig Start Date End Date Taking? Authorizing Provider  Calcium Carb-Cholecalciferol (CALCIUM-VITAMIN D3) 600-500 MG-UNIT CAPS Take 1 capsule by mouth 3 (three) times daily with meals. 03/27/14  Yes Patrici Ranks, MD  calcium carbonate (TUMS - DOSED IN MG ELEMENTAL CALCIUM) 500 MG chewable tablet Chew 1 tablet by mouth daily. 1000 mg   Yes Historical Provider, MD  carvedilol (COREG)  3.125 MG tablet Take 1 tablet (3.125 mg total) by mouth 2 (two) times daily with a meal. 05/16/14  Yes Mikey Kirschner, MD  esomeprazole (NEXIUM) 20 MG capsule Take 20 mg by mouth daily as needed (indigestion).    Yes Historical Provider, MD  furosemide (LASIX) 20 MG tablet Take 1 tablet (20 mg total) by mouth daily. 05/08/14  Yes Baird Cancer, PA-C  HYDROcodone-acetaminophen (NORCO) 10-325 MG per tablet Take 1 tablet by mouth every 6 (six) hours as needed. 04/28/14  Yes Baird Cancer, PA-C  levothyroxine (SYNTHROID) 50 MCG tablet Take 2 tablets (100 mcg total) by mouth daily before breakfast. 02/18/14  Yes Baird Cancer, PA-C  lidocaine-prilocaine (EMLA) cream Apply 1 application topically as needed. Apply to port 1 hour prior to accessing   Yes Historical Provider, MD  lisinopril (PRINIVIL,ZESTRIL) 5 MG tablet TAKE ONE TABLET BY MOUTH ONCE DAILY 03/24/14  Yes Mikey Kirschner, MD  LORazepam (ATIVAN) 1 MG tablet TAKE ONE TABLET BY MOUTH AT BEDTIME AS NEEDED FOR ANXIETY 03/24/14  Yes Mikey Kirschner, MD  megestrol (MEGACE) 400 MG/10ML suspension Take 20 mLs (800 mg total) by mouth daily. 04/28/14  Yes Baird Cancer, PA-C  nitrofurantoin, macrocrystal-monohydrate, (MACROBID) 100 MG capsule Take 1 capsule (100 mg total) by mouth 2 (two) times daily. 05/13/14 05/20/14 Yes Manon Hilding Kefalas, PA-C  ondansetron (ZOFRAN) 8 MG tablet Take by mouth every 8 (eight) hours as needed for nausea or vomiting. Starting day of chemo as needed   Yes Historical Provider, MD  terazosin (HYTRIN) 5 MG capsule Take 1 or 2 capsules at bedtime for urinary hesitancy. 07/25/13  Yes Farrel Gobble, MD     Allergies:  No Known Allergies  Social History:   reports that he has been smoking Cigarettes.  He has a 70 pack-year smoking history. He has never used smokeless tobacco. He reports that he does not drink alcohol or use illicit drugs.  Family History: Family History  Problem Relation Age of Onset  . Diabetes Father    . Heart disease Father      Physical Exam: Filed Vitals:   05/20/14 2130 05/20/14 2200 05/20/14 2300 05/20/14 2314  BP: '75/48 76/43 71/39 '$ 64/30  Pulse: 99 92 83   Temp:    99.5 F (37.5 C)  TempSrc:    Rectal  Resp: '25 19 15   '$ Height:      Weight:      SpO2: 95% 93% 93%    Blood pressure 64/30, pulse 83, temperature 99.5 F (37.5 C), temperature source Rectal, resp. rate 15, height '5\' 6"'$  (1.676 m), weight 59.875 kg (132 lb), SpO2 93 %.  GEN:  Pleasant  patient lying in the stretcher in no acute distress; cooperative with exam. PSYCH:  alert and oriented x4; does not appear anxious or depressed; affect is appropriate. HEENT: Mucous membranes pink and anicteric; PERRLA; EOM intact; no cervical lymphadenopathy nor thyromegaly or carotid bruit; no JVD; There were no stridor. Neck is very  supple. Breasts:: Not examined CHEST WALL: No tenderness CHEST: Normal respiration, clear to auscultation bilaterally.  HEART: Regular rate and rhythm.  There are no murmur, rub, or gallops.   BACK: No kyphosis or scoliosis; no CVA tenderness ABDOMEN: soft and non-tender; no masses, no organomegaly, normal abdominal bowel sounds; no pannus; no intertriginous candida. There is no rebound and no distention. Rectal Exam: Not done EXTREMITIES: No bone or joint deformity; age-appropriate arthropathy of the hands and knees; no edema; no ulcerations.  There is no calf tenderness. Genitalia: not examined PULSES: 2+ and symmetric SKIN: Normal hydration no rash or ulceration CNS: Cranial nerves 2-12 grossly intact no focal lateralizing neurologic deficit.  Speech is fluent; uvula elevated with phonation, facial symmetry and tongue midline. DTR are normal bilaterally, cerebella exam is intact, barbinski is negative and strengths are equaled bilaterally.  No sensory loss.   Labs on Admission:  Basic Metabolic Panel:  Recent Labs Lab 05/20/14 1835  NA 134*  K 5.5*  CL 106  CO2 19*  GLUCOSE 99  BUN 50*   CREATININE 2.01*  CALCIUM 8.1*   Liver Function Tests:  Recent Labs Lab 05/20/14 1835  AST 12*  ALT 11*  ALKPHOS 50  BILITOT 0.7  PROT 7.0  ALBUMIN 2.8*   No results for input(s): LIPASE, AMYLASE in the last 168 hours. No results for input(s): AMMONIA in the last 168 hours. CBC:  Recent Labs Lab 05/20/14 1835  WBC 12.1*  NEUTROABS 11.8*  HGB 8.4*  HCT 25.2*  MCV 88.1  PLT 259   Radiological Exams on Admission: Dg Chest 2 View  05/20/2014   CLINICAL DATA:  81 year old male with fever and weakness. 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 Patient currently on chemotherapy.  EXAM: CHEST  2 VIEW  COMPARISON:  Prior CT scan of the chest abdomen and pelvis 03/25/2014  FINDINGS: Stable cardiomegaly. Right subclavian approach single-lumen power injectable port catheter. Catheter tip overlies the distal SVC. Atherosclerotic calcification present in the transverse aorta.  No focal airspace consolidation, pleural effusion or pneumothorax. Stable pleural parenchymal scarring in the right apex and medial aspect of the left upper lobe. Surgical changes of prior left lower lobectomy.  Prior resection of the left seventh rib. Healed fractures of the left eighth and ninth ribs. Destructive lesion in the medial aspect of the left scapula. This abnormality was better demonstrated on the prior CT scan. No acute spine fracture. Chronic blunting of the left costophrenic angle.  IMPRESSION: 1. Stable chest x-ray without evidence of acute cardiopulmonary process. 2. Loss of the medial border of the left scapula consistent with known osseous metastasis. 3. Surgical changes of prior left lower lobectomy.   Electronically Signed   By: Jacqulynn Cadet M.D.   On: 05/20/2014 19:34    Assessment/Plan Present on Admission:  . Sepsis due to urinary tract infection . Bilateral hydronephrosis . Hematuria . Metastatic cancer . 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 Sepsis  PLAN:   Will admit him for the ICU for sepsis due to UTI.  Will continue his broad spectrum antibiotics.  He has received 2 L of IVF, and will continue IVF.  Will start him on IV Norepinephrine.  His antiHTN will be held.  I discussed code status, and confirmed that he is a full code.  WIll admit him to [facility] service.  Thank you, Dr [name], for allowing me to participate in the care of this nice gentleman.    Other plans as per orders.  Code Status: FULL Haskel Khan, MD. [facility] Pager [phone] 7pm to 7am.  05/20/2014, 11:42 PM

## 2014-05-20 NOTE — ED Notes (Signed)
Wife reports pt has a tumor on left shoulder blade and has been receiving chemo and radiation.  Wife says when she got home she found out that pt has been sleeping more than usual and noticed blood all over the commode.  Reporst was given and antibiotic last week for bladder infection.  Reports took last one Saturday.  Last radiation treatment was last Friday and last chemo was 6 or 7 weeks ago.  Reports stopped chemo.  Pt also has tumor in r  Lung.   Reports was told had CHF last week and was given 7 days worth of fluid pills.

## 2014-05-21 ENCOUNTER — Encounter (HOSPITAL_COMMUNITY): Payer: Self-pay | Admitting: *Deleted

## 2014-05-21 DIAGNOSIS — A419 Sepsis, unspecified organism: Secondary | ICD-10-CM

## 2014-05-21 DIAGNOSIS — E872 Acidosis: Secondary | ICD-10-CM

## 2014-05-21 DIAGNOSIS — R6521 Severe sepsis with septic shock: Secondary | ICD-10-CM

## 2014-05-21 DIAGNOSIS — D649 Anemia, unspecified: Secondary | ICD-10-CM

## 2014-05-21 DIAGNOSIS — E8729 Other acidosis: Secondary | ICD-10-CM

## 2014-05-21 DIAGNOSIS — D508 Other iron deficiency anemias: Secondary | ICD-10-CM

## 2014-05-21 LAB — COMPREHENSIVE METABOLIC PANEL
ALBUMIN: 2.1 g/dL — AB (ref 3.5–5.0)
ALT: 10 U/L — AB (ref 17–63)
AST: 10 U/L — ABNORMAL LOW (ref 15–41)
Alkaline Phosphatase: 40 U/L (ref 38–126)
Anion gap: 7 (ref 5–15)
BUN: 42 mg/dL — ABNORMAL HIGH (ref 6–20)
CALCIUM: 7 mg/dL — AB (ref 8.9–10.3)
CO2: 17 mmol/L — AB (ref 22–32)
Chloride: 113 mmol/L — ABNORMAL HIGH (ref 101–111)
Creatinine, Ser: 1.76 mg/dL — ABNORMAL HIGH (ref 0.61–1.24)
GFR calc Af Amer: 41 mL/min — ABNORMAL LOW (ref >60)
GFR calc non Af Amer: 35 mL/min — ABNORMAL LOW (ref >60)
Glucose, Bld: 163 mg/dL — ABNORMAL HIGH (ref 70–99)
Potassium: 4 mmol/L (ref 3.5–5.1)
Sodium: 137 mmol/L (ref 135–145)
TOTAL PROTEIN: 5.4 g/dL — AB (ref 6.5–8.1)
Total Bilirubin: 0.5 mg/dL (ref 0.3–1.2)

## 2014-05-21 LAB — RETICULOCYTES
RBC.: 3.05 MIL/uL — ABNORMAL LOW (ref 4.22–5.81)
RETIC COUNT ABSOLUTE: 36.6 10*3/uL (ref 19.0–186.0)
RETIC CT PCT: 1.2 % (ref 0.4–3.1)

## 2014-05-21 LAB — CBC
HCT: 21.9 % — ABNORMAL LOW (ref 39.0–52.0)
Hemoglobin: 7.3 g/dL — ABNORMAL LOW (ref 13.0–17.0)
MCH: 29.4 pg (ref 26.0–34.0)
MCHC: 33.3 g/dL (ref 30.0–36.0)
MCV: 88.3 fL (ref 78.0–100.0)
PLATELETS: 224 10*3/uL (ref 150–400)
RBC: 2.48 MIL/uL — AB (ref 4.22–5.81)
RDW: 16.9 % — ABNORMAL HIGH (ref 11.5–15.5)
WBC: 10.5 10*3/uL (ref 4.0–10.5)

## 2014-05-21 LAB — IRON AND TIBC
IRON: 12 ug/dL — AB (ref 45–182)
SATURATION RATIOS: 9 % — AB (ref 17.9–39.5)
TIBC: 130 ug/dL — ABNORMAL LOW (ref 250–450)
UIBC: 118 ug/dL

## 2014-05-21 LAB — TSH: TSH: 2.866 u[IU]/mL (ref 0.350–4.500)

## 2014-05-21 LAB — VITAMIN B12: Vitamin B-12: 181 pg/mL (ref 180–914)

## 2014-05-21 LAB — MRSA PCR SCREENING: MRSA BY PCR: NEGATIVE

## 2014-05-21 LAB — FERRITIN: Ferritin: 252 ng/mL (ref 24–336)

## 2014-05-21 LAB — FOLATE: FOLATE: 10.5 ng/mL (ref >5.9)

## 2014-05-21 MED ORDER — PIPERACILLIN-TAZOBACTAM 3.375 G IVPB
INTRAVENOUS | Status: AC
  Filled 2014-05-21: qty 50

## 2014-05-21 MED ORDER — HYDROCORTISONE NA SUCCINATE PF 100 MG IJ SOLR
100.0000 mg | Freq: Three times a day (TID) | INTRAMUSCULAR | Status: DC
  Administered 2014-05-21 – 2014-05-25 (×14): 100 mg via INTRAVENOUS
  Filled 2014-05-21 (×14): qty 2

## 2014-05-21 MED ORDER — VANCOMYCIN HCL 500 MG IV SOLR
500.0000 mg | INTRAVENOUS | Status: DC
  Administered 2014-05-21 – 2014-05-22 (×2): 500 mg via INTRAVENOUS
  Filled 2014-05-21 (×2): qty 500

## 2014-05-21 MED ORDER — ONDANSETRON HCL 4 MG/2ML IJ SOLN: 4.0000 mg | Freq: Four times a day (QID) | INTRAMUSCULAR | Status: DC | PRN

## 2014-05-21 MED ORDER — NOREPINEPHRINE BITARTRATE 1 MG/ML IV SOLN
INTRAVENOUS | Status: AC
  Filled 2014-05-21: qty 4

## 2014-05-21 MED ORDER — PIPERACILLIN-TAZOBACTAM 3.375 G IVPB
3.3750 g | Freq: Three times a day (TID) | INTRAVENOUS | Status: DC
  Administered 2014-05-21 – 2014-05-25 (×12): 3.375 g via INTRAVENOUS
  Filled 2014-05-21 (×17): qty 50

## 2014-05-21 MED ORDER — NOREPINEPHRINE BITARTRATE 1 MG/ML IV SOLN
0.0000 ug/min | INTRAVENOUS | Status: DC
  Administered 2014-05-21: 11 ug/min via INTRAVENOUS
  Administered 2014-05-21: 15 ug/min via INTRAVENOUS
  Filled 2014-05-21: qty 16

## 2014-05-21 MED ORDER — SODIUM CHLORIDE 0.9 % IJ SOLN
3.0000 mL | Freq: Two times a day (BID) | INTRAMUSCULAR | Status: DC
  Administered 2014-05-21 – 2014-05-25 (×5): 3 mL via INTRAVENOUS

## 2014-05-21 MED ORDER — ONDANSETRON HCL 4 MG PO TABS: 4.0000 mg | ORAL_TABLET | Freq: Four times a day (QID) | ORAL | Status: DC | PRN

## 2014-05-21 MED ORDER — LEVOTHYROXINE SODIUM 100 MCG PO TABS
100.0000 ug | ORAL_TABLET | Freq: Every day | ORAL | Status: DC
  Administered 2014-05-21 – 2014-05-25 (×5): 100 ug via ORAL
  Filled 2014-05-21 (×5): qty 1

## 2014-05-21 MED ORDER — NOREPINEPHRINE BITARTRATE 1 MG/ML IV SOLN
INTRAVENOUS | Status: AC
  Filled 2014-05-21: qty 16

## 2014-05-21 MED ORDER — PIPERACILLIN-TAZOBACTAM 3.375 G IVPB
3.3750 g | Freq: Once | INTRAVENOUS | Status: AC
  Administered 2014-05-21: 3.375 g via INTRAVENOUS
  Filled 2014-05-21: qty 50

## 2014-05-21 NOTE — Progress Notes (Signed)
ANTIBIOTIC CONSULT NOTE- follow up  Pharmacy Consult for Vancomycin and Zosyn Indication: Sepsis  No Known Allergies  Patient Measurements: Height: '5\' 6"'$  (167.6 cm) Weight: 127 lb 13.9 oz (58 kg) IBW/kg (Calculated) : 63.8  Vital Signs: Temp: 97.7 F (36.5 C) (05/04 0400) Temp Source: Oral (05/04 0400) BP: 121/62 mmHg (05/04 0730) Pulse Rate: 67 (05/04 0730)  Labs:  Recent Labs  05/20/14 1835 05/21/14 0436  WBC 12.1* 10.5  HGB 8.4* 7.3*  PLT 259 224  CREATININE 2.01* 1.76*    Estimated Creatinine Clearance: 28.4 mL/min (by C-G formula based on Cr of 1.76).  No results for input(s): VANCOTROUGH, VANCOPEAK, VANCORANDOM, GENTTROUGH, GENTPEAK, GENTRANDOM, TOBRATROUGH, TOBRAPEAK, TOBRARND, AMIKACINPEAK, AMIKACINTROU, AMIKACIN in the last 72 hours.   Microbiology: Recent Results (from the past 720 hour(s))  Culture, blood (routine x 2)     Status: None (Preliminary result)   Collection Time: 05/20/14  6:35 PM  Result Value Ref Range Status   Specimen Description BLOOD RIGHT ARM  Final   Special Requests BOTTLES DRAWN AEROBIC AND ANAEROBIC 8CC EACH  Final   Culture PENDING  Incomplete   Report Status PENDING  Incomplete  MRSA PCR Screening     Status: None   Collection Time: 05/21/14  1:00 AM  Result Value Ref Range Status   MRSA by PCR NEGATIVE NEGATIVE Final    Comment:        The GeneXpert MRSA Assay (FDA approved for NASAL specimens only), is one component of a comprehensive MRSA colonization surveillance program. It is not intended to diagnose MRSA infection nor to guide or monitor treatment for MRSA infections.    Medical History: Past Medical History  Diagnosis Date  . Tracheostomy in place   . Cancer 2010    laryngeal  . Lung cancer 2010  . Pneumonia 2014  . Dysrhythmia     takes Coreg and Lisinopril daily  . Shortness of breath     with exertion  . Enlarged prostate     takes flomax  . Urinary urgency   . Gastric ulcer     hx of  . Hx of  transfusion of packed red blood cells     no abnormal reaction noted by patient  . Umbilical hernia   . Metastatic cancer 07/24/2012  . Hyperlipidemia   . ED (erectile dysfunction)   . Insomnia   . CHF (congestive heart failure)   . 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  01/27/2014   Anti-infectives    Start     Dose/Rate Route Frequency Ordered Stop   05/21/14 2100  vancomycin (VANCOCIN) 500 mg in sodium chloride 0.9 % 100 mL IVPB     500 mg 100 mL/hr over 60 Minutes Intravenous Every 24 hours 05/21/14 0739     05/21/14 1400  piperacillin-tazobactam (ZOSYN) IVPB 3.375 g     3.375 g 12.5 mL/hr over 240 Minutes Intravenous Every 8 hours 05/21/14 0738     05/21/14 0300  piperacillin-tazobactam (ZOSYN) IVPB 3.375 g     3.375 g 12.5 mL/hr over 240 Minutes Intravenous  Once 05/21/14 0256 05/21/14 0727   05/20/14 2015  vancomycin (VANCOCIN) 1,250 mg in sodium chloride 0.9 % 250 mL IVPB     1,250 mg 166.7 mL/hr over 90 Minutes Intravenous  Once 05/20/14 2004 05/21/14 0034   05/20/14 1900  piperacillin-tazobactam (ZOSYN) IVPB 3.375 g     3.375 g 100 mL/hr over 30 Minutes Intravenous  Once 05/20/14 1855 05/20/14 2020   05/20/14 1900  vancomycin (VANCOCIN) 1,250 mg in sodium chloride 0.9 % 250 mL IVPB  Status:  Discontinued     1,250 mg 166.7 mL/hr over 90 Minutes Intravenous  Once 05/20/14 1855 05/20/14 1951     Assessment: 81 yo male seen in the ED with hematuria, fever, chills, and hypotension. Pt on macrobid for UTI since 4/25. Pt will be admitted for broad spectrum antibiotics for sepsis.  Initial doses of ABX given on admission.  Goal of Therapy:  Vancomycin 15-20 mcg/ml Eradicate infection  Plan:  Vancomycin '500mg'$  IV q24hrs Zosyn 3.375gm IV q8h Check Vancomycin trough at steady state Monitor labs, renal fxn, cultures, and progress  Hart Robinsons A, RPH 05/21/2014,7:39 AM

## 2014-05-21 NOTE — Progress Notes (Signed)
[place] for Vancomycin and Zosyn Indication: Sepsis  No Known Allergies  Patient Measurements: Height: '5\' 6"'$  (167.6 cm) Weight: 132 lb (59.875 kg) IBW/kg (Calculated) : 63.8  Vital Signs: Temp: 99.5 F (37.5 C) (05/03 2314) Temp Source: Rectal (05/03 2314) BP: 96/51 mmHg (05/04 0130) Pulse Rate: 75 (05/04 0130)  Labs:  Recent Labs  05/20/14 1835  WBC 12.1*  HGB 8.4*  PLT 259  CREATININE 2.01*    Estimated Creatinine Clearance: 25.7 mL/min (by C-G formula based on Cr of 2.01).  No results for input(s): VANCOTROUGH, VANCOPEAK, VANCORANDOM, GENTTROUGH, GENTPEAK, GENTRANDOM, TOBRATROUGH, TOBRAPEAK, TOBRARND, AMIKACINPEAK, AMIKACINTROU, AMIKACIN in the last 72 hours.   Microbiology: Recent Results (from the past 720 hour(s))  Culture, blood (routine x 2)     Status: None (Preliminary result)   Collection Time: 05/20/14  6:35 PM  Result Value Ref Range Status   Specimen Description BLOOD RIGHT ARM  Final   Special Requests BOTTLES DRAWN AEROBIC AND ANAEROBIC [place]  Final   Culture PENDING  Incomplete   Report Status PENDING  Incomplete    Medical History: Past Medical History  Diagnosis Date  . Tracheostomy in place   . Cancer 2010    laryngeal  . Lung cancer 2010  . Pneumonia 2014  . Dysrhythmia     takes Coreg and Lisinopril daily  . Shortness of breath     with exertion  . Enlarged prostate     takes flomax  . Urinary urgency   . Gastric ulcer     hx of  . Hx of transfusion of packed red blood cells     no abnormal reaction noted by patient  . Umbilical hernia   . Metastatic cancer 07/24/2012  . Hyperlipidemia   . ED (erectile dysfunction)   . Insomnia   . CHF (congestive heart failure)   . 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 Pt on macrobid for UTI since 4/25. Pt will be admitted for broad spectrum antibiotics for sepsis.  Goal of Therapy:  Vancomycin 15-20 mcg/ml Eradicate infection  Plan:  Preliminary review of pertinent patient information completed.  Protocol will be initiated with a one-time dose of Zosyn 3.375 IV @ seven hours after the initial dose given in the ED.[name] Kitchen  Forestine Na clinical pharmacist will complete review during morning rounds to assess patient and finalize treatment regimen.  Norberto Sorenson, [facility] 05/21/2014,3:01 AM

## 2014-05-21 NOTE — Progress Notes (Signed)
-  Patient in hospital-  [facility] 05/22/2014 9:37 AM

## 2014-05-21 NOTE — Progress Notes (Addendum)
[facility] PROGRESS NOTE  Jeffrey [name] CHY:[id number] DOB: 1933-08-28 DOA: 05/20/2014 PCP: [name], MD  Assessment/Plan: Septic shock -Secondary to UTI. -Continue levophed and try to wean as blood pressure allows. -continue IV fluids at 100 mL an hour.  UTI -Given septic shock, I agree with continuing broad-spectrum antibiotics consisting of vancomycin and Zosyn pending culture data.  Metastatic lung/laryngeal cancer -Status post tracheostomy. -Follow-up with oncology outpatient. -Not actively receiving treatment.  Anemia -Check anemia panel, transfuse if hemoglobin drops below 7.  Hyperchloremic Acidosis -Follow and consider initiation of bicarbonate if worsens tomorrow.  Code Status: full code Family Communication: wife at bedside updated on plan of care  Disposition Plan: home when ready   Consultants:  none   Antibiotics:  Vancomycin  Zosyn   Subjective: Only complaint is being hungry and wants to eat.  Denies shortness of breath, chest pain, pain.  Objective: Filed Vitals:   05/21/14 1000 05/21/14 1015 05/21/14 1030 05/21/14 1045  BP: '96/47 94/55 92/52 '$ 90/54  Pulse: 64 64 52 54  Temp:      TempSrc:      Resp: '16 23 18 14  '$ Height:      Weight:      SpO2: 99% 99% 100% 99%    Intake/Output Summary (Last 24 hours) at 05/21/14 1113 Last data filed at 05/21/14 0800  Gross per 24 hour  Intake 298.13 ml  Output   2000 ml  Net -1701.87 ml   Filed Weights   05/20/14 1821 05/21/14 0500  Weight: 59.875 kg (132 lb) 58 kg (127 lb 13.9 oz)    Exam:   General:  Alert, awake, oriented 3, no distress  Cardiovascular: regular rate and rhythm, no murmurs, rubs or gallops  Respiratory: clear to auscultation bilaterally,tracheostomy present.  Abdomen: soft, nontender, nondistended, positive bowel sounds  Extremities: no clubbing, cyanosis or edema   Neurologic:  Grossly intact and nonfocal  Data Reviewed: Basic Metabolic  Panel:  Recent Labs Lab 05/20/14 1835 05/21/14 0436  NA 134* 137  K 5.5* 4.0  CL 106 113*  CO2 19* 17*  GLUCOSE 99 163*  BUN 50* 42*  CREATININE 2.01* 1.76*  CALCIUM 8.1* 7.0*   Liver Function Tests:  Recent Labs Lab 05/20/14 1835 05/21/14 0436  AST 12* 10*  ALT 11* 10*  ALKPHOS 50 40  BILITOT 0.7 0.5  PROT 7.0 5.4*  ALBUMIN 2.8* 2.1*   No results for input(s): LIPASE, AMYLASE in the last 168 hours. No results for input(s): AMMONIA in the last 168 hours. CBC:  Recent Labs Lab 05/20/14 1835 05/21/14 0436  WBC 12.1* 10.5  NEUTROABS 11.8*  --   HGB 8.4* 7.3*  HCT 25.2* 21.9*  MCV 88.1 88.3  PLT 259 224   Cardiac Enzymes: No results for input(s): CKTOTAL, CKMB, CKMBINDEX, TROPONINI in the last 168 hours. BNP (last 3 results)  Recent Labs  05/08/14 1215  BNP 326.0*    ProBNP (last 3 results) No results for input(s): PROBNP in the last 8760 hours.  CBG: No results for input(s): GLUCAP in the last 168 hours.  Recent Results (from the past 240 hour(s))  Culture, blood (routine x 2)     Status: None (Preliminary result)   Collection Time: 05/20/14  6:35 PM  Result Value Ref Range Status   Specimen Description BLOOD RIGHT ARM  Final   Special Requests BOTTLES DRAWN AEROBIC AND ANAEROBIC [place]  Final   Culture NO GROWTH 1 DAY  Final   Report  Status PENDING  Incomplete  Culture, blood (routine x 2)     Status: None (Preliminary result)   Collection Time: 05/20/14  6:40 PM  Result Value Ref Range Status   Specimen Description BLOOD  Final   Special Requests NONE  Final   Culture NO GROWTH 1 DAY  Final   Report Status PENDING  Incomplete  MRSA PCR Screening     Status: None   Collection Time: 05/21/14  1:00 AM  Result Value Ref Range Status   MRSA by PCR NEGATIVE NEGATIVE Final    Comment:        The GeneXpert MRSA Assay (FDA approved for NASAL specimens only), is one component of a comprehensive MRSA colonization surveillance program. It is  not intended to diagnose MRSA infection nor to guide or monitor treatment for MRSA infections.      Studies: Dg Chest 2 View  05/20/2014   CLINICAL DATA:  81 year old male with fever and weakness. 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 Patient currently on chemotherapy.  EXAM: CHEST  2 VIEW  COMPARISON:  Prior CT scan of the chest abdomen and pelvis 03/25/2014  FINDINGS: Stable cardiomegaly. Right subclavian approach single-lumen power injectable port catheter. Catheter tip overlies the distal SVC. Atherosclerotic calcification present in the transverse aorta.  No focal airspace consolidation, pleural effusion or pneumothorax. Stable pleural parenchymal scarring in the right apex and medial aspect of the left upper lobe. Surgical changes of prior left lower lobectomy.  Prior resection of the left seventh rib. Healed fractures of the left eighth and ninth ribs. Destructive lesion in the medial aspect of the left scapula. This abnormality was better demonstrated on the prior CT scan. No acute spine fracture. Chronic blunting of the left costophrenic angle.  IMPRESSION: 1. Stable chest x-ray without evidence of acute cardiopulmonary process. 2. Loss of the medial border of the left scapula consistent with known osseous metastasis. 3. Surgical changes of prior left lower lobectomy.   Electronically Signed   By: Jacqulynn Cadet M.D.   On: 05/20/2014 19:34    Scheduled Meds: . hydrocortisone sod succinate (SOLU-CORTEF) inj  100 mg Intravenous Q8H  . levothyroxine  100 mcg Oral QAC breakfast  . piperacillin-tazobactam (ZOSYN)  IV  3.375 g Intravenous Q8H  . sodium chloride  3 mL Intravenous Q12H  . vancomycin  500 mg Intravenous Q24H   Continuous Infusions: . dextrose 5 % and 0.9% NaCl 100 mL/hr at 05/21/14 1100  . 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 Greater than 50% of this time was spent in direct contact with the patient coordinating care.    Lelon Frohlich  [facility] Pager [phone]  If 7PM-7AM, please contact night-coverage at [email or web address], password [facility] 05/21/2014, 11:13 AM  LOS: 1 day

## 2014-05-21 NOTE — Care Management Note (Signed)
Case Management Note  Patient Details  Name: Jeffrey [name] MRN: [id number] Date of Birth: Dec 06, 1933    Expected Discharge Date:  05/26/14               Expected Discharge Plan:  Home/Self Care  In-House Referral:  NA  Discharge planning Services  CM Consult  Post Acute Care Choice:  NA Choice offered to:  NA  DME Arranged:    DME Agency:     HH Arranged:    [place] Agency:     Status of Service:  In process, will continue to follow  Medicare Important Message Given:    Date Medicare IM Given:    Medicare IM give by:    Date Additional Medicare IM Given:    Additional Medicare Important Message give by:     If discussed at [place] of Stay Meetings, dates discussed:    Additional Comments: Pt is from home, lives with wife. Pt has cane and neb machine for PRN use. Pt has no HH services or other DME's prior to admission. Pt plans to discharge home with self care, ?HH. ? Need PT eval prior to DC. Will cont to follow.   Sherald Barge, RN 05/21/2014, 3:10 PM

## 2014-05-21 NOTE — ED Notes (Signed)
Report given to [facility] in ICU

## 2014-05-22 ENCOUNTER — Ambulatory Visit (HOSPITAL_COMMUNITY): Payer: Medicare Other | Admitting: Oncology

## 2014-05-22 LAB — BASIC METABOLIC PANEL
Anion gap: 9 (ref 5–15)
BUN: 36 mg/dL — ABNORMAL HIGH (ref 6–20)
CHLORIDE: 113 mmol/L — AB (ref 101–111)
CO2: 17 mmol/L — AB (ref 22–32)
CREATININE: 1.52 mg/dL — AB (ref 0.61–1.24)
Calcium: 6.9 mg/dL — ABNORMAL LOW (ref 8.9–10.3)
GFR calc non Af Amer: 42 mL/min — ABNORMAL LOW (ref >60)
GFR, EST AFRICAN AMERICAN: 49 mL/min — AB (ref >60)
Glucose, Bld: 233 mg/dL — ABNORMAL HIGH (ref 70–99)
POTASSIUM: 3.6 mmol/L (ref 3.5–5.1)
Sodium: 139 mmol/L (ref 135–145)

## 2014-05-22 LAB — CBC
HCT: 24.4 % — ABNORMAL LOW (ref 39.0–52.0)
Hemoglobin: 8 g/dL — ABNORMAL LOW (ref 13.0–17.0)
MCH: 28.8 pg (ref 26.0–34.0)
MCHC: 32.8 g/dL (ref 30.0–36.0)
MCV: 87.8 fL (ref 78.0–100.0)
Platelets: 307 10*3/uL (ref 150–400)
RBC: 2.78 MIL/uL — ABNORMAL LOW (ref 4.22–5.81)
RDW: 16.9 % — ABNORMAL HIGH (ref 11.5–15.5)
WBC: 14.7 10*3/uL — ABNORMAL HIGH (ref 4.0–10.5)

## 2014-05-22 LAB — URINE CULTURE: Colony Count: 6000

## 2014-05-22 MED ORDER — STERILE WATER FOR INJECTION IV SOLN
INTRAVENOUS | Status: DC
  Administered 2014-05-22: 11:00:00 via INTRAVENOUS
  Filled 2014-05-22 (×4): qty 850

## 2014-05-22 MED ORDER — CYANOCOBALAMIN 1000 MCG/ML IJ SOLN
1000.0000 ug | Freq: Every day | INTRAMUSCULAR | Status: DC
  Administered 2014-05-22 – 2014-05-25 (×4): 1000 ug via INTRAMUSCULAR
  Filled 2014-05-22 (×4): qty 1

## 2014-05-22 MED ORDER — PIPERACILLIN-TAZOBACTAM 3.375 G IVPB
INTRAVENOUS | Status: AC
  Filled 2014-05-22: qty 50

## 2014-05-22 MED ORDER — NOREPINEPHRINE BITARTRATE 1 MG/ML IV SOLN
INTRAVENOUS | Status: AC
  Filled 2014-05-22: qty 16

## 2014-05-22 MED ORDER — LORAZEPAM 1 MG PO TABS
1.0000 mg | ORAL_TABLET | Freq: Every evening | ORAL | Status: DC | PRN
  Administered 2014-05-22 – 2014-05-25 (×3): 1 mg via ORAL
  Filled 2014-05-22: qty 2
  Filled 2014-05-22 (×2): qty 1

## 2014-05-22 NOTE — Progress Notes (Signed)
[facility] PROGRESS NOTE  Jeffrey [name] JAS:[id number] DOB: [birth year] DOA: 05/20/2014 PCP: [name], MD  Assessment/Plan: Septic shock -Secondary to UTI. -Continue to titrate levophed. Blood pressures have remained stable on minimal levophed dosing. -Continue IV fluids at current rate of 100 mL an hour.  UTI -Urine culture is still pending, blood cultures 2 with no growth to date. -Given severity of disease with septic shock, we'll continue broad-spectrum antibiotics pending culture data (vancomycin and Zosyn).  Metastatic lung/laryngeal cancer -Status post tracheostomy. -Not actively receiving treatment. -Follow-up with oncology as an outpatient.  Hyperchloremic metabolic acidosis -Bicarbonate remains around 17, acidosis is non-gap, does not have diarrhea. -Likely secondary to aggressive IV fluid repletion with septic shock. -We'll start bicarbonate drip.  Anemia -Unlikely to be iron deficient with a ferritin of 419. -He is certainly vitamin F-79 deficient with a vitamin B 12 level of 180. -We'll start daily IM B-12 repletion for a week. -Hemoglobin remained stable to slightly improved around the 7.5-8 range. -No indication for transfusion unless hemoglobin were to drop below 7.  Severe protein caloric malnutrition -Appetite is increasing.   Code Status: Full code Family Communication: Discussed with son, Larkin Ina, at bedside  Disposition Plan: Keep in ICU today.   Consultants:  None   Antibiotics:  Vancomycin  Zosyn   Subjective: No complaints today. Is anxious to go home.  Objective: Filed Vitals:   05/22/14 0815 05/22/14 0830 05/22/14 0845 05/22/14 0900  BP: 115/66 113/66 119/105 100/48  Pulse: 64 72 60 60  Temp:      TempSrc:      Resp: '24 26 25 25  '$ Height:      Weight:      SpO2: 100% 100% 100% 100%    Intake/Output Summary (Last 24 hours) at 05/22/14 0923 Last data filed at 05/22/14 0510  Gross per 24 hour  Intake 3744.42  ml  Output   1400 ml  Net 2344.42 ml   Filed Weights   05/20/14 1821 05/21/14 0500 05/22/14 0500  Weight: 59.875 kg (132 lb) 58 kg (127 lb 13.9 oz) 62.2 kg (137 lb 2 oz)    Exam:   General:  Alert, awake, oriented 3, no distress  Cardiovascular: Regular rate and rhythm, no murmurs, rubs or gallops  Respiratory: Clear to auscultation bilaterally  Abdomen: Soft, nontender, nondistended, positive bowel sounds  Extremities: No clubbing, cyanosis or edema, positive pulses   Neurologic:  Grossly intact and nonfocal  Data Reviewed: Basic Metabolic Panel:  Recent Labs Lab 05/20/14 1835 05/21/14 0436 05/22/14 0436  NA 134* 137 139  K 5.5* 4.0 3.6  CL 106 113* 113*  CO2 19* 17* 17*  GLUCOSE 99 163* 233*  BUN 50* 42* 36*  CREATININE 2.01* 1.76* 1.52*  CALCIUM 8.1* 7.0* 6.9*   Liver Function Tests:  Recent Labs Lab 05/20/14 1835 05/21/14 0436  AST 12* 10*  ALT 11* 10*  ALKPHOS 50 40  BILITOT 0.7 0.5  PROT 7.0 5.4*  ALBUMIN 2.8* 2.1*   No results for input(s): LIPASE, AMYLASE in the last 168 hours. No results for input(s): AMMONIA in the last 168 hours. CBC:  Recent Labs Lab 05/20/14 1835 05/21/14 0436 05/22/14 0436  WBC 12.1* 10.5 14.7*  NEUTROABS 11.8*  --   --   HGB 8.4* 7.3* 8.0*  HCT 25.2* 21.9* 24.4*  MCV 88.1 88.3 87.8  PLT 259 224 307   Cardiac Enzymes: No results for input(s): CKTOTAL, CKMB, CKMBINDEX, TROPONINI in the last 168 hours. BNP (  last 3 results)  Recent Labs  05/08/14 1215  BNP 326.0*    ProBNP (last 3 results) No results for input(s): PROBNP in the last 8760 hours.  CBG: No results for input(s): GLUCAP in the last 168 hours.  Recent Results (from the past 240 hour(s))  Culture, blood (routine x 2)     Status: None (Preliminary result)   Collection Time: 05/20/14  6:35 PM  Result Value Ref Range Status   Specimen Description BLOOD RIGHT ARM  Final   Special Requests BOTTLES DRAWN AEROBIC AND ANAEROBIC [employer]  Final    Culture NO GROWTH 1 DAY  Final   Report Status PENDING  Incomplete  Culture, blood (routine x 2)     Status: None (Preliminary result)   Collection Time: 05/20/14  6:40 PM  Result Value Ref Range Status   Specimen Description BLOOD  Final   Special Requests NONE  Final   Culture NO GROWTH 1 DAY  Final   Report Status PENDING  Incomplete  MRSA PCR Screening     Status: None   Collection Time: 05/21/14  1:00 AM  Result Value Ref Range Status   MRSA by PCR NEGATIVE NEGATIVE Final    Comment:        The GeneXpert MRSA Assay (FDA approved for NASAL specimens only), is one component of a comprehensive MRSA colonization surveillance program. It is not intended to diagnose MRSA infection nor to guide or monitor treatment for MRSA infections.      Studies: Dg Chest 2 View  05/20/2014   CLINICAL DATA:  81 year old male with fever and weakness. 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 Patient currently on chemotherapy.  EXAM: CHEST  2 VIEW  COMPARISON:  Prior CT scan of the chest abdomen and pelvis 03/25/2014  FINDINGS: Stable cardiomegaly. Right subclavian approach single-lumen power injectable port catheter. Catheter tip overlies the distal SVC. Atherosclerotic calcification present in the transverse aorta.  No focal airspace consolidation, pleural effusion or pneumothorax. Stable pleural parenchymal scarring in the right apex and medial aspect of the left upper lobe. Surgical changes of prior left lower lobectomy.  Prior resection of the left seventh rib. Healed fractures of the left eighth and ninth ribs. Destructive lesion in the medial aspect of the left scapula. This abnormality was better demonstrated on the prior CT scan. No acute spine fracture. Chronic blunting of the left costophrenic angle.  IMPRESSION: 1. Stable chest x-ray without evidence of acute cardiopulmonary process. 2. Loss of the medial border of the left scapula consistent with known osseous metastasis. 3.  Surgical changes of prior left lower lobectomy.   Electronically Signed   By: Jacqulynn Cadet M.D.   On: 05/20/2014 19:34    Scheduled Meds: . hydrocortisone sod succinate (SOLU-CORTEF) inj  100 mg Intravenous Q8H  . levothyroxine  100 mcg Oral QAC breakfast  . piperacillin-tazobactam (ZOSYN)  IV  3.375 g Intravenous Q8H  . sodium chloride  3 mL Intravenous Q12H  . vancomycin  500 mg Intravenous Q24H   Continuous Infusions: . dextrose 5 % and 0.9% NaCl 100 mL/hr at 05/22/14 0200  . 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 Greater than 50% of this time was spent in direct contact with the patient coordinating care.    Lelon Frohlich  [facility] Pager [phone]  If 7PM-7AM, please contact night-coverage at [email or web address], password [facility] 05/22/2014, 9:23 AM  LOS: 2 days

## 2014-05-22 NOTE — Progress Notes (Signed)
Inpatient Diabetes Program Recommendations  AACE/ADA: New Consensus Statement on Inpatient Glycemic Control (2013)  Target Ranges:  Prepandial:   less than 140 mg/dL      Peak postprandial:   less than 180 mg/dL (1-2 hours)      Critically ill patients:  140 - 180 mg/dL   Results for DIMA, FERRUFINO (MRN [id number]) as of 05/22/2014 13:17  Ref. Range 05/20/2014 18:35 05/21/2014 04:36 05/22/2014 04:36  Glucose Latest Ref Range: 70-99 mg/dL 99 163 (H) 233 (H)   Diabetes history: No Outpatient Diabetes medications: NA Current orders for Inpatient glycemic control: None  Inpatient Diabetes Program Recommendations Correction (SSI): While inpatient and ordered steroids, if appropriate please consider ordering CBGs with Novolog correction scale.  Note: Patient has no history of diabetes. Noted lab glucose of 233 mg/dl this morning at 4:36 am. Patient is ordered Solucortef 100 mg Q8H which is likely cause of hyperglycemia. While inpatient and ordered steroids, if appropriate please consider ordering CBGs with Novolog correction scale.  Thanks, Barnie Alderman, RN, MSN, CCRN, CDE Diabetes Coordinator Inpatient Diabetes Program [phone] (Team Pager from [place] to [place]) [phone] ([place] office) [phone] [facility] office)

## 2014-05-22 NOTE — Progress Notes (Signed)
Pt unable to void since foley was removed this am. Bladder scanned revealed 796cc urine. MD paged to make aware. Orders given to replace foley.

## 2014-05-22 NOTE — Progress Notes (Signed)
CRITICAL VALUE ALERT  Critical value received: blood cultures Anaerobic bottle gram negative rods   Date of notification: 05/22/14  Time of notification:  9093  Critical value read back:yes  Nurse who received alert:  E.Eunice Winecoff RN  MD notified (1st page):  Dr.Hernandez  Time of first page:  16  MD notified (2nd page):  Time of second page:  Responding MD:  Dr. Jerilee Hoh  Time MD responded:

## 2014-05-23 DIAGNOSIS — R7881 Bacteremia: Secondary | ICD-10-CM

## 2014-05-23 DIAGNOSIS — A4150 Gram-negative sepsis, unspecified: Secondary | ICD-10-CM

## 2014-05-23 DIAGNOSIS — B370 Candidal stomatitis: Secondary | ICD-10-CM

## 2014-05-23 DIAGNOSIS — D519 Vitamin B12 deficiency anemia, unspecified: Secondary | ICD-10-CM

## 2014-05-23 DIAGNOSIS — R338 Other retention of urine: Secondary | ICD-10-CM

## 2014-05-23 DIAGNOSIS — E43 Unspecified severe protein-calorie malnutrition: Secondary | ICD-10-CM

## 2014-05-23 LAB — BASIC METABOLIC PANEL
ANION GAP: 7 (ref 5–15)
BUN: 30 mg/dL — ABNORMAL HIGH (ref 6–20)
CO2: 22 mmol/L (ref 22–32)
CREATININE: 1.26 mg/dL — AB (ref 0.61–1.24)
Calcium: 6.2 mg/dL — CL (ref 8.9–10.3)
Chloride: 108 mmol/L (ref 101–111)
GFR calc Af Amer: >60 mL/min (ref >60)
GFR calc non Af Amer: 53 mL/min — ABNORMAL LOW (ref >60)
Glucose, Bld: 115 mg/dL — ABNORMAL HIGH (ref 70–99)
POTASSIUM: 3 mmol/L — AB (ref 3.5–5.1)
SODIUM: 137 mmol/L (ref 135–145)

## 2014-05-23 LAB — CBC
HCT: 22.6 % — ABNORMAL LOW (ref 39.0–52.0)
HEMOGLOBIN: 7.5 g/dL — AB (ref 13.0–17.0)
MCH: 28.8 pg (ref 26.0–34.0)
MCHC: 33.2 g/dL (ref 30.0–36.0)
MCV: 86.9 fL (ref 78.0–100.0)
Platelets: 240 10*3/uL (ref 150–400)
RBC: 2.6 MIL/uL — AB (ref 4.22–5.81)
RDW: 16.9 % — ABNORMAL HIGH (ref 11.5–15.5)
WBC: 10 10*3/uL (ref 4.0–10.5)

## 2014-05-23 MED ORDER — SODIUM CHLORIDE 0.9 % IV SOLN
1.0000 g | Freq: Once | INTRAVENOUS | Status: AC
  Administered 2014-05-23: 1 g via INTRAVENOUS
  Filled 2014-05-23: qty 10

## 2014-05-23 MED ORDER — NYSTATIN 100000 UNIT/ML MT SUSP
5.0000 mL | Freq: Four times a day (QID) | OROMUCOSAL | Status: DC
  Administered 2014-05-23 – 2014-05-25 (×8): 500000 [IU] via ORAL
  Filled 2014-05-23 (×9): qty 5

## 2014-05-23 MED ORDER — TERAZOSIN HCL 5 MG PO CAPS
5.0000 mg | ORAL_CAPSULE | Freq: Every day | ORAL | Status: DC
  Filled 2014-05-23 (×2): qty 1

## 2014-05-23 MED ORDER — VANCOMYCIN HCL 500 MG IV SOLR
500.0000 mg | INTRAVENOUS | Status: DC
  Administered 2014-05-23 – 2014-05-24 (×2): 500 mg via INTRAVENOUS
  Filled 2014-05-23 (×3): qty 500

## 2014-05-23 NOTE — Progress Notes (Addendum)
CRITICAL VALUE ALERT  Critical value received:  Blood Cultures Positive for Gram + Rods   +++Initially Lab called and said blood cx were growing gram + Cocci, but called back stating that they were actually growing gram + rods. MD made aware.+++  Date of notification:  05/23/14  Time of notification:  7846  Critical value read back:Yes.    Nurse who received alert:  Penni Homans, RN   MD notified (1st page):  Dr. Jerilee Hoh  Time of first page:  1145  MD notified (2nd page):  Time of second page:  Responding MD:  Dr. Jerilee Hoh  Time MD responded:  1150

## 2014-05-23 NOTE — Care Management Note (Signed)
Case Management Note  Patient Details  Name: COLEBY YETT MRN: [id number] Date of Birth: 08/18/1933  Expected Discharge Date:  05/26/14               Expected Discharge Plan:  Home/Self Care  In-House Referral:  NA  Discharge planning Services  CM Consult  Post Acute Care Choice:  NA Choice offered to:  NA  DME Arranged:    DME Agency:     HH Arranged:    [place] Agency:     Status of Service:  Completed, signed off  Medicare Important Message Given:  Yes Date Medicare IM Given:  05/23/14 Medicare IM give by:  Jolene Provost, RN, MSN, CM  Date Additional Medicare IM Given:    Additional Medicare Important Message give by:     If discussed at [place] of Stay Meetings, dates discussed:    Additional Comments: No CM needs anticipated if DC'd over weekend.  Sherald Barge, RN 05/23/2014, 12:24 PM

## 2014-05-23 NOTE — Progress Notes (Signed)
Pt is requesting foley to be taken out. I spoke to MD and she stated that if pt wants foley catheter to be removed we may. He is requesting his terazosin (Hytrin) '5mg'$  which he takes at home for urinary hesitancy before foley is removed. MD is aware, will reorder medication. Will continue to monitor.

## 2014-05-23 NOTE — Progress Notes (Signed)
Critical calcium called by lab. 6.2 MD notified via text page

## 2014-05-23 NOTE — Progress Notes (Signed)
Pt transferring to [address] today. Pt/family is aware and agreeable to transfer. Assessment is unchanged from this morning and receiving RN has been given report. Belongings sent with pt at bedside.  

## 2014-05-23 NOTE — Progress Notes (Addendum)
[facility] PROGRESS NOTE  NYSIR [name] PFX:[id number] DOB: 01/04/1934 DOA: 05/20/2014 PCP: [name], MD  Assessment/Plan: Septic shock -Secondary to UTI. -We were able to wean him off levophed yesterday. -Continue IV fluids today, await culture data.  Gram-negative rod bacteremia/Sepsis -Likely source is the urine. -Await speciation, in the meantime continue Zosyn and discontinue vancomycin.[name] Kitchen  UTI -Urine culture with insignificant growth, one out of 2 blood cultures with gram-negative rods, wait speciation. -Discontinue vancomycin and continue Zosyn for now  Hyperchloremic metabolic acidosis -Has resolved on bicarbonate drip. Will discontinue today.  Vitamin B-12 deficiency anemia -Continue daily vitamin B-12 supplementation for 7 days, then weekly for a month and monthly thereafter. -No indication for acute transfusion.  Acute urinary retention -Was unable to void after removing Foley catheter yesterday requiring reinsertion.  -We will consider restarting terra Zosyn later today if blood pressure remains stable. -Will be discharged with Foley with urology follow-up.  Oral Thrush -Start nystatin suspension 4 times a day.  Severe protein caloric malnutrition -Appetite is increasing.  Metastatic lung/laryngeal cancer -Status post tracheostomy. -Not actively receiving treatment. -Follow-up with oncology as an outpatient.  Code Status: Full code Family Communication: Daughter at bedside updated on plan of care  Disposition Plan: Transfer to floor today, can consider discharging home once blood cultures are final.   Consultants:  None   Antibiotics:  Zosyn   Subjective: Sleeping, easily rouses, no complaints, appetite is good, anxious to go home.  Objective: Filed Vitals:   05/23/14 0630 05/23/14 0700 05/23/14 0730 05/23/14 0800  BP: 114/65 111/66 130/87 110/62  Pulse: 64 64 76 72  Temp:      TempSrc:      Resp: '19 22 22 19  '$ Height:       Weight:      SpO2: 97% 98% 99% 99%    Intake/Output Summary (Last 24 hours) at 05/23/14 0917 Last data filed at 05/23/14 3299  Gross per 24 hour  Intake   1565 ml  Output   1475 ml  Net     90 ml   Filed Weights   05/21/14 0500 05/22/14 0500 05/23/14 0500  Weight: 58 kg (127 lb 13.9 oz) 62.2 kg (137 lb 2 oz) 62 kg (136 lb 11 oz)    Exam:   General:  Alert, awake, oriented 3, no distress  Cardiovascular: Regular rate and rhythm  Respiratory: Clear to auscultation bilaterally  Abdomen: Soft, nontender, nondistended, positive bowel sounds  Extremities: No clubbing, cyanosis or edema, positive pulses   Neurologic:  Grossly intact and nonfocal  Data Reviewed: Basic Metabolic Panel:  Recent Labs Lab 05/20/14 1835 05/21/14 0436 05/22/14 0436 05/23/14 0432  NA 134* 137 139 137  K 5.5* 4.0 3.6 3.0*  CL 106 113* 113* 108  CO2 19* 17* 17* 22  GLUCOSE 99 163* 233* 115*  BUN 50* 42* 36* 30*  CREATININE 2.01* 1.76* 1.52* 1.26*  CALCIUM 8.1* 7.0* 6.9* 6.2*   Liver Function Tests:  Recent Labs Lab 05/20/14 1835 05/21/14 0436  AST 12* 10*  ALT 11* 10*  ALKPHOS 50 40  BILITOT 0.7 0.5  PROT 7.0 5.4*  ALBUMIN 2.8* 2.1*   No results for input(s): LIPASE, AMYLASE in the last 168 hours. No results for input(s): AMMONIA in the last 168 hours. CBC:  Recent Labs Lab 05/20/14 1835 05/21/14 0436 05/22/14 0436 05/23/14 0432  WBC 12.1* 10.5 14.7* 10.0  NEUTROABS 11.8*  --   --   --   HGB 8.4*  7.3* 8.0* 7.5*  HCT 25.2* 21.9* 24.4* 22.6*  MCV 88.1 88.3 87.8 86.9  PLT 259 224 307 240   Cardiac Enzymes: No results for input(s): CKTOTAL, CKMB, CKMBINDEX, TROPONINI in the last 168 hours. BNP (last 3 results)  Recent Labs  05/08/14 1215  BNP 326.0*    ProBNP (last 3 results) No results for input(s): PROBNP in the last 8760 hours.  CBG: No results for input(s): GLUCAP in the last 168 hours.  Recent Results (from the past 240 hour(s))  Culture, blood  (routine x 2)     Status: None (Preliminary result)   Collection Time: 05/20/14  6:35 PM  Result Value Ref Range Status   Specimen Description BLOOD RIGHT ARM  Final   Special Requests BOTTLES DRAWN AEROBIC AND ANAEROBIC 8CC EACH  Final   Culture   Final    GRAM NEGATIVE RODS Gram Stain Report Called to,Read Back By and Verified With: Miller. AT 4081 ON 05/22/2014 BY BAUGHAM,M. Performed at [facility]    Report Status PENDING  Incomplete  Culture, blood (routine x 2)     Status: None (Preliminary result)   Collection Time: 05/20/14  6:40 PM  Result Value Ref Range Status   Specimen Description BLOOD  Final   Special Requests NONE  Final   Culture NO GROWTH 3 DAYS  Final   Report Status PENDING  Incomplete  Urine culture     Status: None   Collection Time: 05/20/14  9:16 PM  Result Value Ref Range Status   Specimen Description URINE, CLEAN CATCH  Final   Special Requests NONE  Final   Colony Count   Final    6,000 COLONIES/ML Performed at [facility]    Culture   Final    INSIGNIFICANT GROWTH Performed at [facility]    Report Status 05/22/2014 FINAL  Final  MRSA PCR Screening     Status: None   Collection Time: 05/21/14  1:00 AM  Result Value Ref Range Status   MRSA by PCR NEGATIVE NEGATIVE Final    Comment:        The GeneXpert MRSA Assay (FDA approved for NASAL specimens only), is one component of a comprehensive MRSA colonization surveillance program. It is not intended to diagnose MRSA infection nor to guide or monitor treatment for MRSA infections.      Studies: No results found.  Scheduled Meds: . cyanocobalamin  1,000 mcg Intramuscular Daily  . hydrocortisone sod succinate (SOLU-CORTEF) inj  100 mg Intravenous Q8H  . levothyroxine  100 mcg Oral QAC breakfast  . piperacillin-tazobactam (ZOSYN)  IV  3.375 g Intravenous Q8H  . sodium chloride  3 mL Intravenous Q12H  . vancomycin  500 mg Intravenous Q24H   Continuous  Infusions: .  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 Greater than 50% of this time was spent in direct contact with the patient coordinating care.    Lelon Frohlich  [facility] Pager [phone]  If 7PM-7AM, please contact night-coverage at [email or web address], password [facility] 05/23/2014, 9:17 AM  LOS:  3 days

## 2014-05-23 NOTE — Progress Notes (Signed)
Pt BP was 95/57. Notified Mid-level provider before giving terazosin. Provider instructed me to hold medication due to BP being to low. Pt's foley is still in place.

## 2014-05-24 LAB — CBC
HCT: 24.7 % — ABNORMAL LOW (ref 39.0–52.0)
HEMOGLOBIN: 8.3 g/dL — AB (ref 13.0–17.0)
MCH: 29.3 pg (ref 26.0–34.0)
MCHC: 33.6 g/dL (ref 30.0–36.0)
MCV: 87.3 fL (ref 78.0–100.0)
Platelets: 226 10*3/uL (ref 150–400)
RBC: 2.83 MIL/uL — ABNORMAL LOW (ref 4.22–5.81)
RDW: 16.7 % — ABNORMAL HIGH (ref 11.5–15.5)
WBC: 8.1 10*3/uL (ref 4.0–10.5)

## 2014-05-24 LAB — BASIC METABOLIC PANEL
ANION GAP: 6 (ref 5–15)
BUN: 31 mg/dL — ABNORMAL HIGH (ref 6–20)
CO2: 25 mmol/L (ref 22–32)
Calcium: 6 mg/dL — CL (ref 8.9–10.3)
Chloride: 107 mmol/L (ref 101–111)
Creatinine, Ser: 1.37 mg/dL — ABNORMAL HIGH (ref 0.61–1.24)
GFR calc Af Amer: 55 mL/min — ABNORMAL LOW (ref >60)
GFR calc non Af Amer: 48 mL/min — ABNORMAL LOW (ref >60)
Glucose, Bld: 113 mg/dL — ABNORMAL HIGH (ref 70–99)
Potassium: 3 mmol/L — ABNORMAL LOW (ref 3.5–5.1)
SODIUM: 138 mmol/L (ref 135–145)

## 2014-05-24 MED ORDER — POTASSIUM CHLORIDE CRYS ER 20 MEQ PO TBCR
40.0000 meq | EXTENDED_RELEASE_TABLET | ORAL | Status: DC
  Filled 2014-05-24: qty 2

## 2014-05-24 MED ORDER — SODIUM CHLORIDE 0.9 % IV SOLN
1.0000 g | Freq: Once | INTRAVENOUS | Status: AC
  Administered 2014-05-24: 1 g via INTRAVENOUS
  Filled 2014-05-24: qty 10

## 2014-05-24 MED ORDER — POTASSIUM CHLORIDE 20 MEQ/15ML (10%) PO SOLN
40.0000 meq | ORAL | Status: AC
  Administered 2014-05-24 (×3): 40 meq via ORAL
  Filled 2014-05-24 (×3): qty 30

## 2014-05-24 NOTE — Progress Notes (Signed)
[facility] PROGRESS NOTE  Jeffrey [name] FUX:[id number] DOB: [birth year] DOA: 05/20/2014 PCP: [name], MD  Assessment/Plan: Septic shock -Presumed secondary to UTI, however 2 blood cultures are growing gram-positive rods (cultures had been growing gram-negative rods and today have been updated to gram-positive rods). -Called the microbiology lab this morning and it is going to be at least 24 hours before final culture data is available. -Continue current broad-spectrum antibiotics, we were able to wean him off levophed 2 days ago.  UTI -Urine culture with insignificant growth. -Continue current antibiotics pending final culture data.  Hyperchloremic metabolic acidosis -Resolved on bicarbonate drip. -Bicarbonate drip has been discontinued as of 5/6.  Vitamin B-12 deficiency -Continue daily vitamin B-12 supplementation for 7 days total, then weekly for a month and monthly thereafter.  Acute urinary retention  -Failed voiding trial, will discharge with Foley to follow up with urology as an outpatient.  Oral thrush -Continue nystatin suspension, improving.  Severe protein caloric malnutrition -Appetite is increasing.  Metastatic lung/laryngeal cancer -Status post tracheostomy, not actively receiving treatment and will follow-up with oncology as an outpatient.  Hypokalemia -Replete orally  Hypocalcemia -Replete IV    Code Status: Full code Family Communication: Daughter at bedside updated on plan of care  Disposition Plan: Home when culture data finalized and appropriate antibiotics instituted, hope 24-48 hours.   Consultants:  None   Antibiotics:  Vancomycin  Zosyn   Subjective: Appetite is good, anxious to go home, denies chest pain, shortness of breath, has been walking down the hallway without issue.  Objective: Filed Vitals:   05/23/14 1630 05/23/14 2104 05/23/14 2149 05/24/14 0617  BP: 137/70  95/57 124/56  Pulse: 67 68 63 60  Temp:    98.5 F (36.9 C) 98.2 F (36.8 C)  TempSrc:   Oral Oral  Resp: '27 20 20 20  '$ Height:      Weight:      SpO2: 99% 95% 98% 98%    Intake/Output Summary (Last 24 hours) at 05/24/14 1317 Last data filed at 05/24/14 0800  Gross per 24 hour  Intake    360 ml  Output    400 ml  Net    -40 ml   Filed Weights   05/21/14 0500 05/22/14 0500 05/23/14 0500  Weight: 58 kg (127 lb 13.9 oz) 62.2 kg (137 lb 2 oz) 62 kg (136 lb 11 oz)    Exam:   General:  Alert, awake, oriented 3, no distress  Cardiovascular: Regular rate and rhythm  Respiratory: Clear to auscultation bilaterally, tracheostomy present  Abdomen: Soft, nontender, nondistended, positive bowel sounds  Extremities: No clubbing, cyanosis or edema   Neurologic:  Grossly intact, nonfocal  Data Reviewed: Basic Metabolic Panel:  Recent Labs Lab 05/20/14 1835 05/21/14 0436 05/22/14 0436 05/23/14 0432 05/24/14 0601  NA 134* 137 139 137 138  K 5.5* 4.0 3.6 3.0* 3.0*  CL 106 113* 113* 108 107  CO2 19* 17* 17* 22 25  GLUCOSE 99 163* 233* 115* 113*  BUN 50* 42* 36* 30* 31*  CREATININE 2.01* 1.76* 1.52* 1.26* 1.37*  CALCIUM 8.1* 7.0* 6.9* 6.2* 6.0*   Liver Function Tests:  Recent Labs Lab 05/20/14 1835 05/21/14 0436  AST 12* 10*  ALT 11* 10*  ALKPHOS 50 40  BILITOT 0.7 0.5  PROT 7.0 5.4*  ALBUMIN 2.8* 2.1*   No results for input(s): LIPASE, AMYLASE in the last 168 hours. No results for input(s): AMMONIA in the last 168 hours. CBC:  Recent  Labs Lab 05/20/14 1835 05/21/14 0436 05/22/14 0436 05/23/14 0432 05/24/14 0601  WBC 12.1* 10.5 14.7* 10.0 8.1  NEUTROABS 11.8*  --   --   --   --   HGB 8.4* 7.3* 8.0* 7.5* 8.3*  HCT 25.2* 21.9* 24.4* 22.6* 24.7*  MCV 88.1 88.3 87.8 86.9 87.3  PLT 259 224 307 240 226   Cardiac Enzymes: No results for input(s): CKTOTAL, CKMB, CKMBINDEX, TROPONINI in the last 168 hours. BNP (last 3 results)  Recent Labs  05/08/14 1215  BNP 326.0*    ProBNP (last 3  results) No results for input(s): PROBNP in the last 8760 hours.  CBG: No results for input(s): GLUCAP in the last 168 hours.  Recent Results (from the past 240 hour(s))  Culture, blood (routine x 2)     Status: None (Preliminary result)   Collection Time: 05/20/14  6:35 PM  Result Value Ref Range Status   Specimen Description BLOOD RIGHT ARM  Final   Special Requests BOTTLES DRAWN AEROBIC AND ANAEROBIC [facility] EACH  Final   Culture   Final    GRAM POSITIVE RODS Note: Gram Stain Report Called to,Read Back By and Verified With: SARA [facility] 05/23/2014 11:50AM BY [place] Gram Stain Report Called to,Read Back By and Verified With: S.HEATH AT 1200 ON 05/23/14 BY S.VANHOORNE    Report Status PENDING  Incomplete  Culture, blood (routine x 2)     Status: None (Preliminary result)   Collection Time: 05/20/14  6:40 PM  Result Value Ref Range Status   Specimen Description BLOOD  Final   Special Requests NONE  Final   Culture  Setup Time   Final    GRAM POSITIVE RODS Gram Stain Report Called to,Read Back By and Verified With: [place], S AT 1145 ON [id number] BY WOODS, M    Culture PENDING  Incomplete   Report Status PENDING  Incomplete  Urine culture     Status: None   Collection Time: 05/20/14  9:16 PM  Result Value Ref Range Status   Specimen Description URINE, CLEAN CATCH  Final   Special Requests NONE  Final   Colony Count   Final    6,000 COLONIES/ML Performed at [facility]    Culture   Final    INSIGNIFICANT GROWTH Performed at [facility]    Report Status 05/22/2014 FINAL  Final  MRSA PCR Screening     Status: None   Collection Time: 05/21/14  1:00 AM  Result Value Ref Range Status   MRSA by PCR NEGATIVE NEGATIVE Final    Comment:        The GeneXpert MRSA Assay (FDA approved for NASAL specimens only), is one component of a comprehensive MRSA colonization surveillance program. It is not intended to diagnose MRSA infection nor to guide or monitor treatment  for MRSA infections.      Studies: No results found.  Scheduled Meds: . cyanocobalamin  1,000 mcg Intramuscular Daily  . hydrocortisone sod succinate (SOLU-CORTEF) inj  100 mg Intravenous Q8H  . levothyroxine  100 mcg Oral QAC breakfast  . nystatin  5 mL Oral QID  . piperacillin-tazobactam (ZOSYN)  IV  3.375 g Intravenous Q8H  . potassium chloride  40 mEq Oral Q4H  . sodium chloride  3 mL Intravenous Q12H  . terazosin  5 mg Oral QHS  . 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 Greater than 50% of this time was spent in direct contact with the patient coordinating care.    Lelon Frohlich  [facility] Pager [phone]  If 7PM-7AM, please contact night-coverage at [email or web address], password [facility] 05/24/2014, 1:17 PM  LOS: 4 days

## 2014-05-25 LAB — CULTURE, BLOOD (ROUTINE X 2)

## 2014-05-25 MED ORDER — CYANOCOBALAMIN 1000 MCG/ML IJ SOLN: 1000.0000 ug | INTRAMUSCULAR | Status: AC

## 2014-05-25 MED ORDER — CIPROFLOXACIN HCL 250 MG PO TABS: 250.0000 mg | ORAL_TABLET | Freq: Two times a day (BID) | ORAL | Status: DC

## 2014-05-25 MED ORDER — NYSTATIN 100000 UNIT/ML MT SUSP: 5.0000 mL | Freq: Four times a day (QID) | OROMUCOSAL | Status: DC

## 2014-05-25 MED ORDER — POTASSIUM CHLORIDE CRYS ER 20 MEQ PO TBCR: 40.0000 meq | EXTENDED_RELEASE_TABLET | Freq: Once | ORAL | Status: DC

## 2014-05-25 NOTE — Progress Notes (Signed)
ANTIBIOTIC CONSULT NOTE- follow up  Pharmacy Consult for Vancomycin and Zosyn Indication: Sepsis  No Known Allergies  Patient Measurements: Height: '5\' 6"'$  (167.6 cm) Weight: 136 lb 11 oz (62 kg) IBW/kg (Calculated) : 63.8  Vital Signs: Temp: 98.5 F (36.9 C) (05/08 0604) Temp Source: Oral (05/08 0604) BP: 113/70 mmHg (05/08 0604) Pulse Rate: 72 (05/08 0604)  Labs:  Recent Labs  05/23/14 0432 05/24/14 0601  WBC 10.0 8.1  HGB 7.5* 8.3*  PLT 240 226  CREATININE 1.26* 1.37*    Estimated Creatinine Clearance: 39 mL/min (by C-G formula based on Cr of 1.37).  No results for input(s): VANCOTROUGH, VANCOPEAK, VANCORANDOM, GENTTROUGH, GENTPEAK, GENTRANDOM, TOBRATROUGH, TOBRAPEAK, TOBRARND, AMIKACINPEAK, AMIKACINTROU, AMIKACIN in the last 72 hours.   Microbiology: Recent Results (from the past 720 hour(s))  Culture, blood (routine x 2)     Status: None   Collection Time: 05/20/14  6:35 PM  Result Value Ref Range Status   Specimen Description BLOOD RIGHT ARM  Final   Special Requests BOTTLES DRAWN AEROBIC AND ANAEROBIC [facility] EACH  Final   Culture   Final    LACTOBACILLUS SPECIES Note: Standardized susceptibility testing for this organism is not available. Note: Gram Stain Report Called to,Read Back By and Verified With: SARA [facility] 05/23/2014 11:50AM BY [place] Performed at [facility]    Report Status 05/25/2014 FINAL  Final  Culture, blood (routine x 2)     Status: None   Collection Time: 05/20/14  6:40 PM  Result Value Ref Range Status   Specimen Description BLOOD  Final   Special Requests NONE  Final   Culture  Setup Time   Final    GRAM POSITIVE RODS Gram Stain Report Called to,Read Back By and Verified With: [place] 0350 ON [id number] BY WOODS, M    Culture   Final    DIPHTHEROIDS(CORYNEBACTERIUM SPECIES) Note: Standardized susceptibility testing for this organism is not available. Note: Performed at [facility] Gram Stain Report Called to,Read Back  By and Verified With: HEATH S 1145 05/23/14 BY WOODSM Performed at [facility]    Report Status 05/25/2014 FINAL  Final  Urine culture     Status: None   Collection Time: 05/20/14  9:16 PM  Result Value Ref Range Status   Specimen Description URINE, CLEAN CATCH  Final   Special Requests NONE  Final   Colony Count   Final    6,000 COLONIES/ML Performed at [facility]    Culture   Final    INSIGNIFICANT GROWTH Performed at [facility]    Report Status 05/22/2014 FINAL  Final  MRSA PCR Screening     Status: None   Collection Time: 05/21/14  1:00 AM  Result Value Ref Range Status   MRSA by PCR NEGATIVE NEGATIVE Final    Comment:        The GeneXpert MRSA Assay (FDA approved for NASAL specimens only), is one component of a comprehensive MRSA colonization surveillance program. It is not intended to diagnose MRSA infection nor to guide or monitor treatment for MRSA infections.    Medical History: Past Medical History  Diagnosis Date  . Tracheostomy in place   . Cancer 2010    laryngeal  . Lung cancer 2010  . Pneumonia 2014  . Dysrhythmia     takes Coreg and Lisinopril daily  . Shortness of breath     with exertion  . Enlarged prostate     takes flomax  .  Urinary urgency   . Gastric ulcer     hx of  . Hx of transfusion of packed red blood cells     no abnormal reaction noted by patient  . Umbilical hernia   . Metastatic cancer 07/24/2012  . Hyperlipidemia   . ED (erectile dysfunction)   . Insomnia   . CHF (congestive heart failure)   . 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375 g     3.375 g 12.5 mL/hr over 240 Minutes Intravenous Every 8 hours 05/21/14 0738     05/21/14 0300  piperacillin-tazobactam (ZOSYN) IVPB 3.375 g     3.375 g 12.5 mL/hr over 240 Minutes Intravenous  Once 05/21/14 0256 05/21/14 0727   05/20/14 2015  vancomycin (VANCOCIN) 1,250 mg in sodium chloride 0.9 % 250 mL IVPB     1,250 mg 166.7 mL/hr over 90 Minutes Intravenous  Once 05/20/14 2004 05/21/14 0034   05/20/14 1900  piperacillin-tazobactam (ZOSYN) IVPB 3.375 g     3.375 g 100 mL/hr over 30 Minutes Intravenous  Once 05/20/14 1855 05/20/14 2020   05/20/14 1900  vancomycin (VANCOCIN) 1,250 mg in sodium chloride 0.9 % 250 mL IVPB  Status:  Discontinued     1,250 mg 166.7 mL/hr over 90 Minutes Intravenous  Once 05/20/14 1855 05/20/14 1951     Assessment: 81 yo M admitted with hematuria, fever, chills, and hypotension. Pt had been on macrobid for UTI since 4/25 & was admitted for broad spectrum IB antibiotics for possible urosepsis.   He is now on day #6 Vanc & Zosyn and clinically improved.  Cx data likely contamination.   Renal function has been stable.    Goal of Therapy:  Vancomycin 15-20 mcg/ml Eradicate infection  Plan:  Vancomycin '500mg'$  IV q24hrs Zosyn 3.375gm IV q8h Check Vancomycin trough at steady state - will check tomorrow if plan to continue Vancomycin Monitor labs, renal fxn, cultures, and progress Duration of therapy per MD- consider d/c antibiotics at this point   [name], [facility] 05/25/2014,1:09 PM

## 2014-05-25 NOTE — Discharge Summary (Signed)
Physician Discharge Summary  Jeffrey [name]:[id number] DOB: 06-Jul-1933 DOA: 05/20/2014  PCP: [name], MD  Admit date: 05/20/2014 Discharge date: 05/25/2014  Time spent: 45 minutes  Recommendations for Outpatient Follow-up:  -Will be discharged home today. -Advised to follow-up with primary care provider in 2 weeks. -Advised to make appointment with urology, Dr. Roni Bread for the next 1 week.   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 male with hx of laryngeal cancer, s/p laryngectomy with ostomy, hx of metastatic lung cancer with bony mets, CHF, HTN, lives at home with wife, presented to the ER with hematuria, chills, and malaise. In the ER, he was found to be hypotensive, with SBP in the 60's. He was given 2 L of IVF, and his SBP went up to 79 systolic. He was able to converse and has no diaphoresis. Work up included a CXR showing no infiltrates, Cr of 2.01 with BUN 50's. His Hb is 8.4 grams per dL, and WBC of 12K. His K was slightly elevated at 5.5 mE/L. His UA showed TNTC WBCs and after blood and urine cultures obtained, he was started on IV Lucianne Lei and Zosyn, and hospitalist was asked to admit him for sepsis due to UTI. His lactic acid is normal.  Hospital Course:   Septic shock/UTI -Presumed secondary to UTI. -Interestingly,  2 out of 2 blood cultures were positive for gram-positive rods, one grew diphtheroids and the second one lactobacillus, wonder if these were just a contaminant. Also question whether there was a lab error in reading the cultures as they had initially been reported as growing gram-negative rods and later switched over the gram-positive rods. -Urine culture was negative, however the culture was drawn after first dose of antibiotics are given in the ED. -He did require pressors initially. -Was initially placed on vancomycin and Zosyn and subsequently transitioned over to Cipro which she will continue for 5 days on discharge.  Hyperchloremic metabolic acidosis -Secondary to sepsis and shock. Sign-did require a bicarbonate drip.  Vitamin B-12 deficiency -Has received 3 daily doses in the hospital, would recommend him to get monthly doses from now on forward.  Oral thrush -Much improved with oral nystatin which she will continue upon discharge.  Acute urinary retention -Failed voiding trial. -We'll discharge with Foley to follow-up with urology as an outpatient within the next week. They would like to see Dr. Jeffie Pollock.  Severe protein caloric malnutrition  Hypokalemia/hypocalcemia -Repleted  Metastatic lung/laryngeal cancer -Status post tracheostomy, not actively receiving treatment will follow-up with oncology as an outpatient as previously scheduled.  Procedures:  None   Consultations:  None  Discharge Instructions  Discharge Instructions    Increase activity slowly    Complete by:  As directed  Medication List    STOP taking these medications        calcium carbonate 500 MG chewable tablet  Commonly known as:  TUMS - dosed in mg elemental calcium     furosemide 20 MG tablet  Commonly known as:  LASIX     lidocaine-prilocaine cream  Commonly known as:  EMLA     lisinopril 5 MG tablet  Commonly known as:  PRINIVIL,ZESTRIL     nitrofurantoin  (macrocrystal-monohydrate) 100 MG capsule  Commonly known as:  MACROBID      TAKE these medications        Calcium-Vitamin D3 600-500 MG-UNIT Caps  Take 1 capsule by mouth 3 (three) times daily with meals.     carvedilol 3.125 MG tablet  Commonly known as:  COREG  Take 1 tablet (3.125 mg total) by mouth 2 (two) times daily with a meal.     ciprofloxacin 250 MG tablet  Commonly known as:  CIPRO  Take 1 tablet (250 mg total) by mouth 2 (two) times daily.     cyanocobalamin 1000 MCG/ML injection  Commonly known as:  (VITAMIN B-12)  Inject 1 mL (1,000 mcg total) into the muscle every 30 (thirty) days.     esomeprazole 20 MG capsule  Commonly known as:  NEXIUM  Take 20 mg by mouth daily as needed (indigestion).     HYDROcodone-acetaminophen 10-325 MG per tablet  Commonly known as:  NORCO  Take 1 tablet by mouth every 6 (six) hours as needed.     levothyroxine 50 MCG tablet  Commonly known as:  SYNTHROID  Take 2 tablets (100 mcg total) by mouth daily before breakfast.     LORazepam 1 MG tablet  Commonly known as:  ATIVAN  TAKE ONE TABLET BY MOUTH AT BEDTIME AS NEEDED FOR ANXIETY     megestrol 400 MG/10ML suspension  Commonly known as:  MEGACE  Take 20 mLs (800 mg total) by mouth daily.     nystatin 100000 UNIT/ML suspension  Commonly known as:  MYCOSTATIN  Take 5 mLs (500,000 Units total) by mouth 4 (four) times daily.     ondansetron 8 MG tablet  Commonly known as:  ZOFRAN  Take by mouth every 8 (eight) hours as needed for nausea or vomiting. Starting day of chemo as needed     terazosin 5 MG capsule  Commonly known as:  HYTRIN  Take 1 or 2 capsules at bedtime for urinary hesitancy.       No Known Allergies     Follow-up Information    Follow up with [name], MD. Schedule an appointment as soon as possible for a visit in 2 weeks.   Specialty:  Family Medicine   Contact information:   [address] [place] [address] [phone]        Follow up with Malka So, MD. Schedule an appointment as soon as possible for a visit in 1 week.   Specialty:  Urology   Contact information:   [address] [place] [address] [phone]        The results of significant diagnostics from this hospitalization (including imaging, microbiology, ancillary and laboratory) are listed below for reference.    Significant Diagnostic Studies: Dg Chest 2 View  05/20/2014   CLINICAL DATA:  81 year old male with fever and weakness. 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 Patient currently on chemotherapy.  EXAM: CHEST  2 VIEW  COMPARISON:  Prior CT scan of the  chest abdomen and pelvis 03/25/2014  FINDINGS: Stable cardiomegaly. Right subclavian approach single-lumen power injectable port catheter. Catheter tip overlies the distal SVC. Atherosclerotic calcification present in the transverse aorta.  No focal airspace consolidation, pleural effusion or pneumothorax. Stable pleural parenchymal scarring in the right apex and medial aspect of the left upper lobe. Surgical changes of prior left lower lobectomy.  Prior resection of the left seventh rib. Healed fractures of the left eighth and ninth ribs. Destructive lesion in the medial aspect of the left scapula. This abnormality was better demonstrated on the prior CT scan. No acute spine fracture. Chronic blunting of the left costophrenic angle.  IMPRESSION: 1. Stable chest x-ray without evidence of acute cardiopulmonary process. 2. Loss of the medial border of the left scapula consistent with known osseous metastasis. 3. Surgical changes of prior left lower lobectomy.   Electronically Signed   By: Jacqulynn Cadet M.D.   On: 05/20/2014 19:34    Microbiology: Recent Results (from the past 240 hour(s))  Culture, blood (routine x 2)     Status: None   Collection Time: 05/20/14  6:35 PM  Result Value Ref Range Status   Specimen Description BLOOD RIGHT ARM  Final   Special Requests  BOTTLES DRAWN AEROBIC AND ANAEROBIC [facility] EACH  Final   Culture   Final    LACTOBACILLUS SPECIES Note: Standardized susceptibility testing for this organism is not available. Note: Gram Stain Report Called to,Read Back By and Verified With: SARA [facility] 05/23/2014 11:50AM BY [place] Performed at [facility]    Report Status 05/25/2014 FINAL  Final  Culture, blood (routine x 2)     Status: None   Collection Time: 05/20/14  6:40 PM  Result Value Ref Range Status   Specimen Description BLOOD  Final   Special Requests NONE  Final   Culture  Setup Time   Final    GRAM POSITIVE RODS Gram Stain Report Called to,Read Back By and Verified With: [place] 2202 ON [id number] BY WOODS, M    Culture   Final    DIPHTHEROIDS(CORYNEBACTERIUM SPECIES) Note: Standardized susceptibility testing for this organism is not available. Note: Performed at [facility] Gram Stain Report Called to,Read Back By and Verified With: HEATH S 1145 05/23/14 BY WOODSM Performed at [facility]    Report Status 05/25/2014 FINAL  Final  Urine culture     Status: None   Collection Time: 05/20/14  9:16 PM  Result Value Ref Range Status   Specimen Description URINE, CLEAN CATCH  Final   Special Requests NONE  Final   Colony Count   Final    6,000 COLONIES/ML Performed at [facility]    Culture   Final    INSIGNIFICANT GROWTH Performed at [facility]    Report Status 05/22/2014 FINAL  Final  MRSA PCR Screening     Status: None   Collection Time: 05/21/14  1:00 AM  Result Value Ref Range Status   MRSA by PCR NEGATIVE NEGATIVE Final    Comment:        The GeneXpert MRSA Assay (FDA approved for NASAL specimens only), is one component of a comprehensive MRSA colonization surveillance program. It is not intended to diagnose MRSA infection nor to guide or monitor treatment for MRSA infections.      Labs: Basic Metabolic Panel:  Recent Labs Lab 05/20/14 1835 05/21/14 0436  05/22/14 0436 05/23/14 0432 05/24/14 0601  NA 134* 137 139 137 138  K  5.5* 4.0 3.6 3.0* 3.0*  CL 106 113* 113* 108 107  CO2 19* 17* 17* 22 25  GLUCOSE 99 163* 233* 115* 113*  BUN 50* 42* 36* 30* 31*  CREATININE 2.01* 1.76* 1.52* 1.26* 1.37*  CALCIUM 8.1* 7.0* 6.9* 6.2* 6.0*   Liver Function Tests:  Recent Labs Lab 05/20/14 1835 05/21/14 0436  AST 12* 10*  ALT 11* 10*  ALKPHOS 50 40  BILITOT 0.7 0.5  PROT 7.0 5.4*  ALBUMIN 2.8* 2.1*   No results for input(s): LIPASE, AMYLASE in the last 168 hours. No results for input(s): AMMONIA in the last 168 hours. CBC:  Recent Labs Lab 05/20/14 1835 05/21/14 0436 05/22/14 0436 05/23/14 0432 05/24/14 0601  WBC 12.1* 10.5 14.7* 10.0 8.1  NEUTROABS 11.8*  --   --   --   --   HGB 8.4* 7.3* 8.0* 7.5* 8.3*  HCT 25.2* 21.9* 24.4* 22.6* 24.7*  MCV 88.1 88.3 87.8 86.9 87.3  PLT 259 224 307 240 226   Cardiac Enzymes: No results for input(s): CKTOTAL, CKMB, CKMBINDEX, TROPONINI in the last 168 hours. BNP: BNP (last 3 results)  Recent Labs  05/08/14 1215  BNP 326.0*    ProBNP (last 3 results) No results for input(s): PROBNP in the last 8760 hours.  CBG: No results for input(s): GLUCAP in the last 168 hours.     SignedLelon Frohlich  [facility] Pager: [phone] 05/25/2014, 4:01 PM

## 2014-05-25 NOTE — Progress Notes (Addendum)
Patient and family received discharge instructions/scripts and had no further questions or concerns.  Family verbalized understanding of medications and follow-up appointments. Patient and family were educated on foley care, foley bag, and leg bag.  Patient in stable condition at discharge and was escorted to main entrance via wheelchair by nurse tech.

## 2014-05-26 NOTE — Progress Notes (Signed)
UR chart review completed.  

## 2014-05-29 ENCOUNTER — Telehealth (HOSPITAL_COMMUNITY): Payer: Self-pay

## 2014-05-29 ENCOUNTER — Other Ambulatory Visit (HOSPITAL_COMMUNITY): Payer: Self-pay | Admitting: Oncology

## 2014-05-29 DIAGNOSIS — E039 Hypothyroidism, unspecified: Secondary | ICD-10-CM

## 2014-05-29 NOTE — Telephone Encounter (Signed)
Message left for patient's wife to call office with Jidenna's current dosage of levothyroxine.

## 2014-05-30 ENCOUNTER — Other Ambulatory Visit (HOSPITAL_COMMUNITY): Payer: Self-pay | Admitting: Oncology

## 2014-05-30 ENCOUNTER — Other Ambulatory Visit (HOSPITAL_COMMUNITY): Payer: Self-pay

## 2014-05-30 DIAGNOSIS — E039 Hypothyroidism, unspecified: Secondary | ICD-10-CM

## 2014-05-30 MED ORDER — LEVOTHYROXINE SODIUM 50 MCG PO TABS: 100.0000 ug | ORAL_TABLET | Freq: Every day | ORAL | Status: DC

## 2014-06-03 ENCOUNTER — Other Ambulatory Visit (HOSPITAL_COMMUNITY): Payer: Self-pay | Admitting: Oncology

## 2014-06-03 DIAGNOSIS — E039 Hypothyroidism, unspecified: Secondary | ICD-10-CM

## 2014-06-03 MED ORDER — LEVOTHYROXINE SODIUM 50 MCG PO TABS: 100.0000 ug | ORAL_TABLET | Freq: Every day | ORAL | Status: AC

## 2014-06-06 ENCOUNTER — Ambulatory Visit (INDEPENDENT_AMBULATORY_CARE_PROVIDER_SITE_OTHER): Payer: Managed Care, Other (non HMO) | Admitting: Family Medicine

## 2014-06-06 ENCOUNTER — Encounter (HOSPITAL_COMMUNITY): Payer: Managed Care, Other (non HMO) | Attending: Oncology

## 2014-06-06 ENCOUNTER — Encounter: Payer: Self-pay | Admitting: Family Medicine

## 2014-06-06 ENCOUNTER — Encounter (HOSPITAL_BASED_OUTPATIENT_CLINIC_OR_DEPARTMENT_OTHER): Payer: Managed Care, Other (non HMO)

## 2014-06-06 VITALS — BP 100/70 | Ht 67.0 in | Wt 129.0 lb

## 2014-06-06 VITALS — BP 93/46 | HR 60 | Temp 98.6°F | Resp 20

## 2014-06-06 DIAGNOSIS — Z85118 Personal history of other malignant neoplasm of bronchus and lung: Secondary | ICD-10-CM | POA: Diagnosis present

## 2014-06-06 DIAGNOSIS — I952 Hypotension due to drugs: Secondary | ICD-10-CM | POA: Diagnosis not present

## 2014-06-06 DIAGNOSIS — G4700 Insomnia, unspecified: Secondary | ICD-10-CM

## 2014-06-06 DIAGNOSIS — C7951 Secondary malignant neoplasm of bone: Secondary | ICD-10-CM

## 2014-06-06 DIAGNOSIS — C801 Malignant (primary) neoplasm, unspecified: Secondary | ICD-10-CM

## 2014-06-06 DIAGNOSIS — N400 Enlarged prostate without lower urinary tract symptoms: Secondary | ICD-10-CM

## 2014-06-06 DIAGNOSIS — C799 Secondary malignant neoplasm of unspecified site: Secondary | ICD-10-CM

## 2014-06-06 DIAGNOSIS — R918 Other nonspecific abnormal finding of lung field: Secondary | ICD-10-CM | POA: Insufficient documentation

## 2014-06-06 LAB — CBC WITH DIFFERENTIAL/PLATELET
BASOS PCT: 1 % (ref 0–1)
Basophils Absolute: 0 10*3/uL (ref 0.0–0.1)
EOS ABS: 0.1 10*3/uL (ref 0.0–0.7)
Eosinophils Relative: 2 % (ref 0–5)
HCT: 26.4 % — ABNORMAL LOW (ref 39.0–52.0)
Hemoglobin: 8.5 g/dL — ABNORMAL LOW (ref 13.0–17.0)
LYMPHS ABS: 0.4 10*3/uL — AB (ref 0.7–4.0)
Lymphocytes Relative: 8 % — ABNORMAL LOW (ref 12–46)
MCH: 28.9 pg (ref 26.0–34.0)
MCHC: 32.2 g/dL (ref 30.0–36.0)
MCV: 89.8 fL (ref 78.0–100.0)
Monocytes Absolute: 0.3 10*3/uL (ref 0.1–1.0)
Monocytes Relative: 6 % (ref 3–12)
NEUTROS PCT: 83 % — AB (ref 43–77)
Neutro Abs: 4.7 10*3/uL (ref 1.7–7.7)
Platelets: 204 10*3/uL (ref 150–400)
RBC: 2.94 MIL/uL — ABNORMAL LOW (ref 4.22–5.81)
RDW: 17.7 % — ABNORMAL HIGH (ref 11.5–15.5)
WBC: 5.6 10*3/uL (ref 4.0–10.5)

## 2014-06-06 LAB — COMPREHENSIVE METABOLIC PANEL
ALT: 10 U/L — ABNORMAL LOW (ref 17–63)
ANION GAP: 8 (ref 5–15)
AST: 10 U/L — AB (ref 15–41)
Albumin: 2.7 g/dL — ABNORMAL LOW (ref 3.5–5.0)
Alkaline Phosphatase: 48 U/L (ref 38–126)
BUN: 27 mg/dL — AB (ref 6–20)
CHLORIDE: 103 mmol/L (ref 101–111)
CO2: 24 mmol/L (ref 22–32)
CREATININE: 1.41 mg/dL — AB (ref 0.61–1.24)
Calcium: 8 mg/dL — ABNORMAL LOW (ref 8.9–10.3)
GFR, EST AFRICAN AMERICAN: 54 mL/min — AB (ref >60)
GFR, EST NON AFRICAN AMERICAN: 46 mL/min — AB (ref >60)
Glucose, Bld: 97 mg/dL (ref 65–99)
Potassium: 4.8 mmol/L (ref 3.5–5.1)
Sodium: 135 mmol/L (ref 135–145)
Total Bilirubin: 0.4 mg/dL (ref 0.3–1.2)
Total Protein: 6.5 g/dL (ref 6.5–8.1)

## 2014-06-06 LAB — TSH: TSH: 10.13 u[IU]/mL — AB (ref 0.350–4.500)

## 2014-06-06 MED ORDER — FIRST-DUKES MOUTHWASH MT SUSP: OROMUCOSAL | Status: DC

## 2014-06-06 MED ORDER — DENOSUMAB 120 MG/1.7ML ~~LOC~~ SOLN
120.0000 mg | Freq: Once | SUBCUTANEOUS | Status: AC
  Administered 2014-06-06: 120 mg via SUBCUTANEOUS
  Filled 2014-06-06: qty 1.7

## 2014-06-06 NOTE — Progress Notes (Signed)
   Subjective:    Patient ID: Jeffrey [name], male    DOB: [birth year], 81 y.o.   MRN: [id number]  HPI  Patient is here today for a follow up visit on COPD and medication check.   Patient arrives office for follow-up. Recently in the hospital. Has metastatic lung cancer. Was admitted for urinary tract infection and low blood pressure.  Patient takes to Hytrin each evening. This is for urinary flow. He also receives catheter management every 6 hours via his wife.  Patient admits to some irritability at times.  Diminished energy overall. Some chronic cough.  Chronic pain followed by oncology specialist.  Admits diet not as good at this point. Appetite not as good.   Patient was recently discharged from [facility] for UTI and low blood pressure.   Patient is also having trouble with his feet swelling.    Review of Systems No headache no chest pain no back pain abdominal pain no change in bowel habits    Objective:   Physical Exam  Alert no acute distress HEENT trach present some stomatitis present on Tom neck supple lungs no true wheeze. Some rhonchi rare cough heart rare rhythm ankles 1+ edema.  Hospital notes discharge no and all lab results and specialist notes reviewed and presents with patient.      Assessment & Plan:  Impression #1 status post admission for UTI #2 low blood pressure still an issue. Some orthostatic symptoms when he stands up. Was on Fosamax but it did not seem to help. #3 chronic pain covered well #4 peripheral edema likely secondary to low protein discussed plan Carnation Instant Breakfast twice a day. Dukes Magic mouthwash. Back off Hytrin to 1 capsule each evening. Rationale discussed. Follow-up as scheduled. WSL

## 2014-06-06 NOTE — Progress Notes (Signed)
Labs drawn

## 2014-06-06 NOTE — Progress Notes (Signed)
1155:  Jeffrey [name] presents today for injection per the provider's orders.  Xgeva administration without incident; see MAR for injection details.  Patient tolerated procedure well and without incident.  No questions or complaints noted at this time.3

## 2014-06-06 NOTE — Patient Instructions (Signed)
Carnation Instant B-fast twice per day regularly

## 2014-06-06 NOTE — Patient Instructions (Signed)
[place] at [facility] Discharge Instructions  RECOMMENDATIONS MADE BY THE CONSULTANT AND ANY TEST RESULTS WILL BE SENT TO YOUR REFERRING PHYSICIAN.  Today you received Xgeva. Return as scheduled for lab work, injections, and office visit.  Thank you for choosing [place] at [facility] to provide your oncology and hematology care.  To afford each patient quality time with our provider, please arrive at least 15 minutes before your scheduled appointment time.    You need to re-schedule your appointment should you arrive 10 or more minutes late.  We strive to give you quality time with our providers, and arriving late affects you and other patients whose appointments are after yours.  Also, if you no show three or more times for appointments you may be dismissed from the clinic at the providers discretion.     Again, thank you for choosing [facility].  Our hope is that these requests will decrease the amount of time that you wait before being seen by our physicians.       _____________________________________________________________  Should you have questions after your visit to [facility], please contact our office at [phone] between the hours of 8:30 a.m. and 4:30 p.m.  Voicemails left after 4:30 p.m. will not be returned until the following business day.  For prescription refill requests, have your pharmacy contact our office.

## 2014-06-10 ENCOUNTER — Ambulatory Visit (HOSPITAL_COMMUNITY): Payer: Medicare Other | Admitting: Oncology

## 2014-06-10 NOTE — Progress Notes (Signed)
-  Rescheduled-  KEFALAS,THOMAS 06/26/2014

## 2014-06-13 ENCOUNTER — Emergency Department (HOSPITAL_COMMUNITY): Payer: Managed Care, Other (non HMO)

## 2014-06-13 ENCOUNTER — Ambulatory Visit (HOSPITAL_COMMUNITY): Payer: Medicare Other | Admitting: Oncology

## 2014-06-13 ENCOUNTER — Encounter (HOSPITAL_COMMUNITY): Payer: Self-pay | Admitting: *Deleted

## 2014-06-13 ENCOUNTER — Inpatient Hospital Stay (HOSPITAL_COMMUNITY)
Admission: EM | Admit: 2014-06-13 | Discharge: 2014-06-16 | DRG: 698 | Disposition: A | Discharge status: Home / Self Care | Payer: Managed Care, Other (non HMO) | Attending: Family Medicine | Admitting: Family Medicine

## 2014-06-13 DIAGNOSIS — I429 Cardiomyopathy, unspecified: Secondary | ICD-10-CM | POA: Diagnosis present

## 2014-06-13 DIAGNOSIS — R5081 Fever presenting with conditions classified elsewhere: Secondary | ICD-10-CM | POA: Diagnosis present

## 2014-06-13 DIAGNOSIS — Z66 Do not resuscitate: Secondary | ICD-10-CM | POA: Diagnosis present

## 2014-06-13 DIAGNOSIS — F17210 Nicotine dependence, cigarettes, uncomplicated: Secondary | ICD-10-CM | POA: Diagnosis present

## 2014-06-13 DIAGNOSIS — N183 Chronic kidney disease, stage 3 (moderate): Secondary | ICD-10-CM | POA: Diagnosis present

## 2014-06-13 DIAGNOSIS — Z8521 Personal history of malignant neoplasm of larynx: Secondary | ICD-10-CM

## 2014-06-13 DIAGNOSIS — E43 Unspecified severe protein-calorie malnutrition: Secondary | ICD-10-CM | POA: Diagnosis present

## 2014-06-13 DIAGNOSIS — I5022 Chronic systolic (congestive) heart failure: Secondary | ICD-10-CM | POA: Diagnosis present

## 2014-06-13 DIAGNOSIS — D62 Acute posthemorrhagic anemia: Secondary | ICD-10-CM | POA: Diagnosis present

## 2014-06-13 DIAGNOSIS — N390 Urinary tract infection, site not specified: Secondary | ICD-10-CM | POA: Diagnosis present

## 2014-06-13 DIAGNOSIS — Z930 Tracheostomy status: Secondary | ICD-10-CM | POA: Diagnosis not present

## 2014-06-13 DIAGNOSIS — Z833 Family history of diabetes mellitus: Secondary | ICD-10-CM | POA: Diagnosis not present

## 2014-06-13 DIAGNOSIS — A4159 Other Gram-negative sepsis: Secondary | ICD-10-CM | POA: Diagnosis present

## 2014-06-13 DIAGNOSIS — C3492 Malignant neoplasm of unspecified part of left bronchus or lung: Secondary | ICD-10-CM | POA: Diagnosis present

## 2014-06-13 DIAGNOSIS — C801 Malignant (primary) neoplasm, unspecified: Secondary | ICD-10-CM

## 2014-06-13 DIAGNOSIS — Y846 Urinary catheterization as the cause of abnormal reaction of the patient, or of later complication, without mention of misadventure at the time of the procedure: Secondary | ICD-10-CM | POA: Diagnosis present

## 2014-06-13 DIAGNOSIS — R319 Hematuria, unspecified: Secondary | ICD-10-CM

## 2014-06-13 DIAGNOSIS — Z902 Acquired absence of lung [part of]: Secondary | ICD-10-CM | POA: Diagnosis present

## 2014-06-13 DIAGNOSIS — E785 Hyperlipidemia, unspecified: Secondary | ICD-10-CM | POA: Diagnosis present

## 2014-06-13 DIAGNOSIS — R310 Gross hematuria: Secondary | ICD-10-CM | POA: Diagnosis present

## 2014-06-13 DIAGNOSIS — D72819 Decreased white blood cell count, unspecified: Secondary | ICD-10-CM

## 2014-06-13 DIAGNOSIS — D709 Neutropenia, unspecified: Secondary | ICD-10-CM | POA: Diagnosis present

## 2014-06-13 DIAGNOSIS — Z8249 Family history of ischemic heart disease and other diseases of the circulatory system: Secondary | ICD-10-CM | POA: Diagnosis not present

## 2014-06-13 DIAGNOSIS — R6521 Severe sepsis with septic shock: Secondary | ICD-10-CM | POA: Diagnosis present

## 2014-06-13 DIAGNOSIS — C7951 Secondary malignant neoplasm of bone: Secondary | ICD-10-CM | POA: Diagnosis present

## 2014-06-13 DIAGNOSIS — E872 Acidosis: Secondary | ICD-10-CM | POA: Diagnosis present

## 2014-06-13 DIAGNOSIS — T8351XA Infection and inflammatory reaction due to indwelling urinary catheter, initial encounter: Principal | ICD-10-CM | POA: Diagnosis present

## 2014-06-13 DIAGNOSIS — I129 Hypertensive chronic kidney disease with stage 1 through stage 4 chronic kidney disease, or unspecified chronic kidney disease: Secondary | ICD-10-CM | POA: Diagnosis present

## 2014-06-13 DIAGNOSIS — A419 Sepsis, unspecified organism: Secondary | ICD-10-CM

## 2014-06-13 LAB — BASIC METABOLIC PANEL
Anion gap: 10 (ref 5–15)
BUN: 28 mg/dL — ABNORMAL HIGH (ref 6–20)
CO2: 24 mmol/L (ref 22–32)
Calcium: 8 mg/dL — ABNORMAL LOW (ref 8.9–10.3)
Chloride: 102 mmol/L (ref 101–111)
Creatinine, Ser: 1.38 mg/dL — ABNORMAL HIGH (ref 0.61–1.24)
GFR calc Af Amer: 55 mL/min — ABNORMAL LOW (ref >60)
GFR calc non Af Amer: 47 mL/min — ABNORMAL LOW (ref >60)
Glucose, Bld: 103 mg/dL — ABNORMAL HIGH (ref 65–99)
Potassium: 4.4 mmol/L (ref 3.5–5.1)
Sodium: 136 mmol/L (ref 135–145)

## 2014-06-13 LAB — LACTIC ACID, PLASMA
Lactic Acid, Venous: 1.9 mmol/L (ref 0.5–2.0)
Lactic Acid, Venous: 1.7 mmol/L (ref 0.5–2.0)

## 2014-06-13 LAB — URINALYSIS, ROUTINE W REFLEX MICROSCOPIC
Bilirubin Urine: NEGATIVE
Glucose, UA: NEGATIVE mg/dL
Ketones, ur: NEGATIVE mg/dL
Nitrite: POSITIVE — AB
Protein, ur: 100 mg/dL — AB
Specific Gravity, Urine: 1.015 (ref 1.005–1.030)
Urobilinogen, UA: 0.2 mg/dL (ref 0.0–1.0)
pH: 6.5 (ref 5.0–8.0)

## 2014-06-13 LAB — CBC WITH DIFFERENTIAL/PLATELET
Basophils Absolute: 0 10*3/uL (ref 0.0–0.1)
Basophils Relative: 0 % (ref 0–1)
Eosinophils Absolute: 0 10*3/uL (ref 0.0–0.7)
Eosinophils Relative: 6 % — ABNORMAL HIGH (ref 0–5)
HCT: 25.9 % — ABNORMAL LOW (ref 39.0–52.0)
Hemoglobin: 8.5 g/dL — ABNORMAL LOW (ref 13.0–17.0)
Lymphocytes Relative: 11 % — ABNORMAL LOW (ref 12–46)
Lymphs Abs: 0.1 10*3/uL — ABNORMAL LOW (ref 0.7–4.0)
MCH: 29.3 pg (ref 26.0–34.0)
MCHC: 32.8 g/dL (ref 30.0–36.0)
MCV: 89.3 fL (ref 78.0–100.0)
Monocytes Absolute: 0 10*3/uL — ABNORMAL LOW (ref 0.1–1.0)
Monocytes Relative: 0 % — ABNORMAL LOW (ref 3–12)
Neutro Abs: 0.5 10*3/uL — ABNORMAL LOW (ref 1.7–7.7)
Neutrophils Relative %: 83 % — ABNORMAL HIGH (ref 43–77)
Platelets: 219 10*3/uL (ref 150–400)
RBC: 2.9 MIL/uL — ABNORMAL LOW (ref 4.22–5.81)
RDW: 17.7 % — ABNORMAL HIGH (ref 11.5–15.5)
WBC: 0.6 10*3/uL — CL (ref 4.0–10.5)

## 2014-06-13 LAB — URINE MICROSCOPIC-ADD ON

## 2014-06-13 MED ORDER — KETOROLAC TROMETHAMINE 30 MG/ML IJ SOLN
15.0000 mg | Freq: Three times a day (TID) | INTRAMUSCULAR | Status: DC | PRN
  Administered 2014-06-13: 15 mg via INTRAVENOUS

## 2014-06-13 MED ORDER — PIPERACILLIN-TAZOBACTAM 3.375 G IVPB
3.3750 g | Freq: Three times a day (TID) | INTRAVENOUS | Status: DC
  Administered 2014-06-14 – 2014-06-16 (×8): 3.375 g via INTRAVENOUS
  Filled 2014-06-13 (×11): qty 50

## 2014-06-13 MED ORDER — ACETAMINOPHEN 10 MG/ML IV SOLN
INTRAVENOUS | Status: AC
  Filled 2014-06-13: qty 200

## 2014-06-13 MED ORDER — LORAZEPAM 0.5 MG PO TABS
0.5000 mg | ORAL_TABLET | Freq: Every day | ORAL | Status: DC
  Administered 2014-06-14 – 2014-06-15 (×3): 0.5 mg via ORAL
  Filled 2014-06-13 (×3): qty 1

## 2014-06-13 MED ORDER — PIPERACILLIN-TAZOBACTAM 3.375 G IVPB 30 MIN
3.3750 g | Freq: Once | INTRAVENOUS | Status: AC
  Administered 2014-06-13: 3.375 g via INTRAVENOUS
  Filled 2014-06-13: qty 50

## 2014-06-13 MED ORDER — ACETAMINOPHEN 10 MG/ML IV SOLN
1000.0000 mg | Freq: Four times a day (QID) | INTRAVENOUS | Status: DC
  Administered 2014-06-14 (×2): 1000 mg via INTRAVENOUS
  Filled 2014-06-13 (×4): qty 100

## 2014-06-13 MED ORDER — ACETAMINOPHEN 650 MG RE SUPP
650.0000 mg | Freq: Once | RECTAL | Status: AC
  Administered 2014-06-13: 650 mg via RECTAL
  Filled 2014-06-13: qty 1

## 2014-06-13 MED ORDER — LEVOTHYROXINE SODIUM 100 MCG PO TABS
100.0000 ug | ORAL_TABLET | Freq: Every day | ORAL | Status: DC
  Administered 2014-06-14 – 2014-06-16 (×3): 100 ug via ORAL
  Filled 2014-06-13 (×3): qty 1

## 2014-06-13 MED ORDER — VANCOMYCIN HCL IN DEXTROSE 1-5 GM/200ML-% IV SOLN
1000.0000 mg | Freq: Once | INTRAVENOUS | Status: AC
  Administered 2014-06-13: 1000 mg via INTRAVENOUS
  Filled 2014-06-13: qty 200

## 2014-06-13 MED ORDER — PANTOPRAZOLE SODIUM 40 MG PO TBEC
40.0000 mg | DELAYED_RELEASE_TABLET | Freq: Every day | ORAL | Status: DC
  Administered 2014-06-14 – 2014-06-16 (×3): 40 mg via ORAL
  Filled 2014-06-13 (×3): qty 1

## 2014-06-13 MED ORDER — CYANOCOBALAMIN 1000 MCG/ML IJ SOLN
1000.0000 ug | INTRAMUSCULAR | Status: DC
  Administered 2014-06-14: 1000 ug via INTRAMUSCULAR
  Filled 2014-06-13: qty 1

## 2014-06-13 MED ORDER — SODIUM CHLORIDE 0.9 % IV BOLUS (SEPSIS)
1000.0000 mL | Freq: Once | INTRAVENOUS | Status: AC
  Administered 2014-06-13: 1000 mL via INTRAVENOUS

## 2014-06-13 MED ORDER — BACITRACIN ZINC 500 UNIT/GM EX OINT
TOPICAL_OINTMENT | CUTANEOUS | Status: AC
  Filled 2014-06-13: qty 0.9

## 2014-06-13 MED ORDER — VANCOMYCIN HCL IN DEXTROSE 750-5 MG/150ML-% IV SOLN
750.0000 mg | INTRAVENOUS | Status: DC
  Administered 2014-06-14: 750 mg via INTRAVENOUS
  Filled 2014-06-13 (×3): qty 150

## 2014-06-13 MED ORDER — KETOROLAC TROMETHAMINE 30 MG/ML IJ SOLN
INTRAMUSCULAR | Status: AC
  Filled 2014-06-13: qty 1

## 2014-06-13 MED ORDER — HYDROCORTISONE NA SUCCINATE PF 100 MG IJ SOLR
50.0000 mg | Freq: Three times a day (TID) | INTRAMUSCULAR | Status: DC
  Administered 2014-06-14 – 2014-06-16 (×8): 50 mg via INTRAVENOUS
  Filled 2014-06-13 (×7): qty 2

## 2014-06-13 MED ORDER — SODIUM CHLORIDE 0.9 % IJ SOLN
3.0000 mL | Freq: Two times a day (BID) | INTRAMUSCULAR | Status: DC
  Administered 2014-06-14 – 2014-06-16 (×6): 3 mL via INTRAVENOUS

## 2014-06-13 MED ORDER — PIPERACILLIN-TAZOBACTAM 3.375 G IVPB
INTRAVENOUS | Status: AC
  Filled 2014-06-13: qty 50

## 2014-06-13 MED ORDER — DEXTROSE-NACL 5-0.9 % IV SOLN
INTRAVENOUS | Status: DC
  Administered 2014-06-13: 23:00:00 via INTRAVENOUS

## 2014-06-13 NOTE — H&P (Addendum)
[facility] History and Physical  MORITZ LEVER YNW:[id number] DOB: Feb 08, 1933    PCP:   [name], MD   Chief Complaint: hematuria and weakness.   HPI:  Jeffrey [name] is an 81 y.o. male with hx of laryngeal cancer, s/p laryngectomy with ostomy, hx of metastatic lung cancer with bony mets, CHF, HTN, lives at home with wife, presented to the ER with hematuria after recent discharge for the same.  In the ER, he was found to be hypotensive, with SBP in the 70. He was given  IVF, and his SBP went up to 65'H systolic. He was able to converse and has no diaphoresis. Work up included a CXR showing no infiltrates, Cr of 1.4 with BUN 50's. His Hb is 8.5 grams per dL, and WBC of 0.6. His K was normal at 4.4 mEq/L. he was started on IV Lucianne Lei and Zosyn, and hospitalist was asked to admit him for sepsis due to UTI. His lactic acid is normal.   Rewiew of Systems:  Constitutional: Negative for malaise, fever and chills. No significant weight loss or weight gain Eyes: Negative for eye pain, redness and discharge, diplopia, visual changes, or flashes of light. ENMT: Negative for ear pain, hoarseness, nasal congestion, sinus pressure and sore throat. No headaches; tinnitus, drooling, or problem swallowing. Cardiovascular: Negative for chest pain, palpitations, diaphoresis, dyspnea and peripheral edema. ; No orthopnea, PND Respiratory: Negative for cough, hemoptysis, wheezing and stridor. No pleuritic chestpain. Gastrointestinal: Negative for nausea, vomiting, diarrhea, constipation, abdominal pain, melena, blood in stool, hematemesis, jaundice and rectal bleeding.    Genitourinary: Negative for frequency, dysuria, incontinence,flank pain and hematuria; Musculoskeletal: Negative for back pain and neck pain. Negative for swelling and trauma.;  Skin: . Negative for pruritus, rash, abrasions, bruising and skin lesion.; ulcerations Neuro: Negative for headache, lightheadedness and neck stiffness.  Negative for weakness, altered level of consciousness , altered mental status, extremity weakness, burning feet, involuntary movement, seizure and syncope.  Psych: negative for anxiety, depression, insomnia, tearfulness, panic attacks, hallucinations, paranoia, suicidal or homicidal ideation    Past Medical History  Diagnosis Date  . Tracheostomy in place   . Cancer 2010    laryngeal  . Lung cancer 2010  . Pneumonia 2014  . Dysrhythmia     takes Coreg and Lisinopril daily  . Shortness of breath     with exertion  . Enlarged prostate     takes flomax  . Urinary urgency   . Gastric ulcer     hx of  . Hx of transfusion of packed red blood cells     no abnormal reaction noted by patient  . Umbilical hernia   . Metastatic cancer 07/24/2012  . Hyperlipidemia   . ED (erectile dysfunction)   . Insomnia   . CHF (congestive heart failure)   . 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 Tracheostomy  2010  . Lung removal, partial  2010    left side  . Layngectomy for laryngeal cancer in2010; left upper lobectomy for lung cancer, 2010.    . Back surgery      thinks it was done in 2012  . Esophagogastroduodenoscopy    . Colonoscopy    . Portacath placement Bilateral 07/12/2012    Procedure: INSERTION PORT-A-CATH;  Surgeon: [name], MD;  Location: [facility];  Service: Thoracic;  Laterality: Bilateral;    Medications:  HOME MEDS: Prior to Admission  medications   Medication Sig Start Date End Date Taking? Authorizing Provider  Calcium Carb-Cholecalciferol (CALCIUM-VITAMIN D3) 600-500 MG-UNIT CAPS Take 1 capsule by mouth 3 (three) times daily with meals. 03/27/14  Yes Patrici Ranks, MD  carvedilol (COREG) 3.125 MG tablet Take 1 tablet (3.125 mg total) by mouth 2 (two) times daily with a meal. 05/16/14  Yes Mikey Kirschner, MD  cyanocobalamin (,VITAMIN B-12,) 1000 MCG/ML injection Inject 1 mL (1,000 mcg total)  into the muscle every 30 (thirty) days. 05/25/14  Yes Erline Hau, MD  esomeprazole (NEXIUM) 20 MG capsule Take 20 mg by mouth daily as needed (indigestion).    Yes Historical Provider, MD  HYDROcodone-acetaminophen (NORCO) 10-325 MG per tablet Take 1 tablet by mouth every 6 (six) hours as needed. Patient taking differently: Take 0.5-1 tablets by mouth every 6 (six) hours as needed for moderate pain.  04/28/14  Yes Baird Cancer, PA-C  levothyroxine (SYNTHROID) 50 MCG tablet Take 2 tablets (100 mcg total) by mouth daily before breakfast. 06/03/14  Yes Baird Cancer, PA-C  LORazepam (ATIVAN) 1 MG tablet TAKE ONE TABLET BY MOUTH AT BEDTIME AS NEEDED FOR ANXIETY 03/24/14  Yes Mikey Kirschner, MD  ondansetron (ZOFRAN) 8 MG tablet Take by mouth every 8 (eight) hours as needed for nausea or vomiting. Starting day of chemo as needed   Yes Historical Provider, MD  terazosin (HYTRIN) 5 MG capsule Take 1 or 2 capsules at bedtime for urinary hesitancy. 07/25/13  Yes Farrel Gobble, MD  Diphenhyd-Hydrocort-Nystatin (FIRST-DUKES MOUTHWASH) SUSP One tablespoon swish and spit QID Patient not taking: Reported on 06/13/2014 06/06/14   Mikey Kirschner, MD  megestrol (MEGACE) 400 MG/10ML suspension Take 20 mLs (800 mg total) by mouth daily. Patient not taking: Reported on 06/06/2014 04/28/14   Baird Cancer, PA-C     Allergies:  No Known Allergies  Social History:   reports that he has been smoking Cigarettes.  He has a 70 pack-year smoking history. He has never used smokeless tobacco. He reports that he does not drink alcohol or use illicit drugs.  Family History: Family History  Problem Relation Age of Onset  . Diabetes Father   . Heart disease Father      Physical Exam: Filed Vitals:   06/13/14 1800 06/13/14 1815 06/13/14 1830 06/13/14 1900  BP: 87/53  88/57 91/55  Pulse:      Temp:   102.3 F (39.1 C) 102 F (38.9 C)  TempSrc:      Resp: '29 27 23 23  '$ Weight:      SpO2:        Blood pressure 91/55, pulse 148, temperature 102 F (38.9 C), temperature source Rectal, resp. rate 23, weight 58.514 kg (129 lb), SpO2 90 %.  GEN:  Pleasant  patient lying in the stretcher in no acute distress; cooperative with exam. PSYCH:  alert and oriented x4; does not appear anxious or depressed; affect is appropriate. HEENT: Mucous membranes pink and anicteric; PERRLA; EOM intact; no cervical lymphadenopathy nor thyromegaly or carotid bruit; no JVD; There were no stridor. Neck is very supple. Breasts:: Not examined CHEST WALL: No tenderness CHEST: Normal respiration, clear to auscultation bilaterally.  HEART: Regular rate and rhythm.  There are no murmur, rub, or gallops.   BACK: No kyphosis or scoliosis; no CVA tenderness ABDOMEN: soft and non-tender; no masses, no organomegaly, normal abdominal bowel sounds; no pannus; no intertriginous candida. There is no rebound and no distention. Rectal Exam: Not done  EXTREMITIES: No bone or joint deformity; age-appropriate arthropathy of the hands and knees; no edema; no ulcerations.  There is no calf tenderness. Genitalia: not examined PULSES: 2+ and symmetric SKIN: Normal hydration no rash or ulceration CNS: Cranial nerves 2-12 grossly intact no focal lateralizing neurologic deficit.     Labs on Admission:  Basic Metabolic Panel:  Recent Labs Lab 06/13/14 1355  NA 136  K 4.4  CL 102  CO2 24  GLUCOSE 103*  BUN 28*  CREATININE 1.38*  CALCIUM 8.0*   CBC:  Recent Labs Lab 06/13/14 1353  WBC 0.6*  NEUTROABS 0.5*  HGB 8.5*  HCT 25.9*  MCV 89.3  PLT 219    Radiological Exams on Admission: Dg Chest Portable 1 View  06/13/2014   CLINICAL DATA:  Hematuria  EXAM: PORTABLE CHEST - 1 VIEW  COMPARISON:  05/20/2014  FINDINGS: Cardiomediastinal silhouette is stable. Right subclavian Port-A-Cath is unchanged in position. No acute infiltrate or pulmonary edema. Old left rib fractures are again noted. Again noted chronic blunting of  the left costophrenic angle. Stable postsurgical changes post left lower lobectomy.  IMPRESSION: No active disease. No significant change. Again noted chronic blunting of left costophrenic angle and status post left lower lobectomy. Old left rib fractures. Stable right subclavian Port-A-Cath position.   Electronically Signed   By: Lahoma Crocker M.D.   On: 06/13/2014 16:29    EKG: Independently reviewed.   Assessment/Plan Present on Admission:  . Septic shock . Sepsis . Protein-calorie malnutrition, severe . 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  He is responding to IVF, and I am surpised that he is able to [facility] and clinically look as well as he is at the time I saw him.  Nevertheless, his prognosis is grave, and he is in septic shock.  Family is at his bedside, and I updated them.  They understand that he may not make it thru this hospitalization.   I believe it is time for a hospice consultation, and the family agreed.  We also discussed code status, and established that he is a DNR/DNI.  Since he is still able to [facility], and responding to IVF, I will admit him to the ICU, continue with IVF,  IV antibiotics, and use pressor tonight if necessary.  If he deteriorates, family agreed that it is best to change GOC to comfort care only.  Since he is in septic shock, we will give him steroid as well.  Thank you for allowing me to participate in his care.   Other plans as per orders.  Code Status: DNR.   Orvan Falconer, MD. [facility] Pager [phone] 7pm to 7am.  06/13/2014, 8:37 PM    ADDENDUM:  I was told of a tick that was removed earlier today.  The tick was engorged, therefore, likely had been there for a while.  Will give 10 days of Doxycycline '100mg'$  BID for 10 days.

## 2014-06-13 NOTE — ED Notes (Signed)
Attempted I&O cath for urine sample, nothing obtained. Per visitor at bedside patient urinated a little while ago.

## 2014-06-13 NOTE — Progress Notes (Addendum)
ANTIBIOTIC CONSULT NOTE  Pharmacy Consult for Vancomycin Indication: rule out sepsis  No Known Allergies  Patient Measurements: Weight: 129 lb (58.514 kg)  Vital Signs: Temp: 103 F (39.4 C) (05/27 1606) Temp Source: Rectal (05/27 1606) BP: 98/70 mmHg (05/27 1538) Pulse Rate: 148 (05/27 1539) Intake/Output from previous day:   Intake/Output from this shift:    Labs:  Recent Labs  06/13/14 1353  WBC 0.6*  HGB 8.5*  PLT 219   Estimated Creatinine Clearance: 35.7 mL/min (by C-G formula based on Cr of 1.41). No results for input(s): VANCOTROUGH, VANCOPEAK, VANCORANDOM, GENTTROUGH, GENTPEAK, GENTRANDOM, TOBRATROUGH, TOBRAPEAK, TOBRARND, AMIKACINPEAK, AMIKACINTROU, AMIKACIN in the last 72 hours.   Microbiology: Recent Results (from the past 720 hour(s))  Culture, blood (routine x 2)     Status: None   Collection Time: 05/20/14  6:35 PM  Result Value Ref Range Status   Specimen Description BLOOD RIGHT ARM  Final   Special Requests BOTTLES DRAWN AEROBIC AND ANAEROBIC [facility] EACH  Final   Culture   Final    LACTOBACILLUS SPECIES Note: Standardized susceptibility testing for this organism is not available. Note: Gram Stain Report Called to,Read Back By and Verified With: SARA [facility] 05/23/2014 11:50AM BY [place] Performed at [facility]    Report Status 05/25/2014 FINAL  Final  Culture, blood (routine x 2)     Status: None   Collection Time: 05/20/14  6:40 PM  Result Value Ref Range Status   Specimen Description BLOOD  Final   Special Requests NONE  Final   Culture  Setup Time   Final    GRAM POSITIVE RODS Gram Stain Report Called to,Read Back By and Verified With: [place] [id number] [place] [id number] BY WOODS, M    Culture   Final    DIPHTHEROIDS(CORYNEBACTERIUM SPECIES) Note: Standardized susceptibility testing for this organism is not available. Note: Performed at [facility] Gram Stain Report Called to,Read Back By and Verified With: HEATH S 1145 05/23/14 BY  WOODSM Performed at [facility]    Report Status 05/25/2014 FINAL  Final  Urine culture     Status: None   Collection Time: 05/20/14  9:16 PM  Result Value Ref Range Status   Specimen Description URINE, CLEAN CATCH  Final   Special Requests NONE  Final   Colony Count   Final    6,000 COLONIES/ML Performed at [facility]    Culture   Final    INSIGNIFICANT GROWTH Performed at [facility]    Report Status 05/22/2014 FINAL  Final  MRSA PCR Screening     Status: None   Collection Time: 05/21/14  1:00 AM  Result Value Ref Range Status   MRSA by PCR NEGATIVE NEGATIVE Final    Comment:        The GeneXpert MRSA Assay (FDA approved for NASAL specimens only), is one component of a comprehensive MRSA colonization surveillance program. It is not intended to diagnose MRSA infection nor to guide or monitor treatment for MRSA infections.     Anti-infectives    Start     Dose/Rate Route Frequency Ordered Stop   06/13/14 1600  piperacillin-tazobactam (ZOSYN) IVPB 3.375 g     3.375 g 100 mL/hr over 30 Minutes Intravenous  Once 06/13/14 1550     06/13/14 1600  vancomycin (VANCOCIN) IVPB 1000 mg/200 mL premix     1,000 mg 200 mL/hr over 60 Minutes Intravenous  Once 06/13/14 1558  Assessment: 81 yo M with hx of metastatic lung CA diagnosed with UTI at urologist office 3 days ago, but never got antibiotic filled.  A tick was also removed from patient.    Empiric, broad-spectrum antibiotics initiated in ED for urosepsis.  Patient was febrile on admission (Tm 103 F).   Scr elevated from patient's baseline.  Estimated CrCl ~ 35m/min.    Goal of Therapy:  Vancomycin 15-20 mcg/ml  Plan:  Zosyn 3.375gm IV Q8h to be infused over 4hrs Vancomycin '750mg'$  IV q24h Check Vancomycin trough at steady state Monitor renal function and cx data   LBiagio Borg5/27/2016,4:15 PM

## 2014-06-13 NOTE — ED Notes (Signed)
CRITICAL VALUE ALERT  Critical value received:  wbc  Date of notification:  06/13/2014  Time of notification:  0623  Critical value read back:Yes.    Nurse who received alert:  Iona Coach  MD notified (1st page):  kohut    Time MD responded:  [phone]

## 2014-06-13 NOTE — ED Notes (Signed)
Patient urinated on self

## 2014-06-13 NOTE — ED Notes (Signed)
notified EDP of blood pressure and temp.

## 2014-06-13 NOTE — ED Notes (Signed)
Port to right chest accessed and flushed. + blood return noted, IVF infusing. Tick removed from left hip by EDP, antibiotic ointment placed per order.

## 2014-06-13 NOTE — Discharge Instructions (Signed)
Hematuria Hematuria is blood in your urine. It can be caused by a bladder infection, kidney infection, prostate infection, kidney stone, or cancer of your urinary tract. Infections can usually be treated with medicine, and a kidney stone usually will pass through your urine. If neither of these is the cause of your hematuria, further workup to find out the reason may be needed. It is very important that you tell your health care provider about any blood you see in your urine, even if the blood stops without treatment or happens without causing pain. Blood in your urine that happens and then stops and then happens again can be a symptom of a very serious condition. Also, pain is not a symptom in the initial stages of many urinary cancers. HOME CARE INSTRUCTIONS   Drink lots of fluid, 3-4 quarts a day. If you have been diagnosed with an infection, cranberry juice is especially recommended, in addition to large amounts of water.  Avoid caffeine, tea, and carbonated beverages because they tend to irritate the bladder.  Avoid alcohol because it may irritate the prostate.  Take all medicines as directed by your health care provider.  If you were prescribed an antibiotic medicine, finish it all even if you start to feel better.  If you have been diagnosed with a kidney stone, follow your health care provider's instructions regarding straining your urine to catch the stone.  Empty your bladder often. Avoid holding urine for long periods of time.  After a bowel movement, women should cleanse front to back. Use each tissue only once.  Empty your bladder before and after sexual intercourse if you are a male. SEEK MEDICAL CARE IF:  You develop back pain.  You have a fever.  You have a feeling of sickness in your stomach (nausea) or vomiting.  Your symptoms are not better in 3 days. Return sooner if you are getting worse. SEEK IMMEDIATE MEDICAL CARE IF:   You develop severe vomiting and are  unable to keep the medicine down.  You develop severe back or abdominal pain despite taking your medicines.  You begin passing a large amount of blood or clots in your urine.  You feel extremely weak or faint, or you pass out. MAKE SURE YOU:   Understand these instructions.  Will watch your condition.  Will get help right away if you are not doing well or get worse. Document Released: 01/03/2005 Document Revised: 05/20/2013 Document Reviewed: 09/03/2012 [facility] Patient Information 2015 [place], [place]. This information is not intended to replace advice given to you by your health care provider. Make sure you discuss any questions you have with your health care provider.  Leukopenia Leukopenia is a condition in which you have a low number of white blood cells. White blood cells help your body fight infections. The number of white blood cells in the body varies from person to person. Leukopenia is usually defined as having fewer than 4,000 white blood cells in 1 microliter of blood. There are five types of white blood cells. Two types make up most of your white blood cell count. These are neutrophils and lymphocytes. When your level of neutrophils is low, it is called neutropenia. When your lymphocytes are low, it is called lymphocytopenia. Neutropenia is the most dangerous type of leukopenia because it can lead to dangerous infections. CAUSES  Most white blood cells are made in the soft tissue inside your bones (bone marrow). Conditions that damage or suppress bone marrow are the most common causes of  leukopenia. These include:  Medicine or X-ray treatments for cancer.  Serious infections.  Cancer of the white blood cells (leukemia or myeloma).  Medicines, including antibiotics, cardiac drugs, steroids, and those used to treat rheumatoid arthritis. Leukopenia also happens when white blood cells are destroyed after leaving your bone marrow. Causes may include:  Liver  disease.  Diseases of the immune system (autoimmune disease).  Vitamin B deficiencies. SIGNS AND SYMPTOMS One of the most common signs of leukopenia, especially severe neutropenia, is having a lot of bacterial infections. Different infections have different symptoms. An infection in your lungs may cause coughing. A urinary tract infection may cause frequent urination and a burning sensation. You may also get infections of the blood, skin, rectum, throat, sinus, or ear. General signs and symptoms of leukopenia include:  Fever.  Fatigue.  Swollen glands (lymph nodes).  Painful mouth ulcers.  Gum disease. DIAGNOSIS  Your health care provider can diagnose leukopenia based on a physical exam and the results of lab tests. During a physical exam, your health care provider will feel for swollen lymph nodes and check whether your spleen is enlarged. Your spleen is an organ on the left side of your body that stores white blood cells. Tests that may be done include:  A complete blood count. This blood test counts each type of white cell.  Bone marrow aspiration. Some bone marrow is removed to be checked under a microscope.  Lymph node biopsy. Some lymph node tissue is removed to be checked under a microscope.  Other types of blood tests or imaging tests. TREATMENT  Treatment of leukopenia depends on the cause. Some common treatments include:  Antibiotics for bacterial infections.  No longer taking medicines that may cause leukopenia.  Vitamin B supplements.  Medicines to stimulate neutrophil production (hematopoietic growth factors) for neutropenia. HOME CARE INSTRUCTIONS  Preventing infection is important if you have leukopenia.  Avoid sick friends and family members.  Wash your hands often.  Do noteat uncooked or undercooked meats.  Wash fruits and vegetables.  Do not eat or drink unpasteurized dairy products.  Get regular dental care, and maintain good dental  hygiene.  Keep all follow-up appointments. SEEK MEDICAL CARE IF:  You have chills or a fever.  You have signs or symptoms of infection. SEEK IMMEDIATE MEDICAL CARE IF:  You have a fever or persistent symptoms for more than 2-3 days.  You have trouble breathing.  You have chest pain. MAKE SURE YOU:  Understand these instructions.  Will watch your condition.  Will get help right away if you are not doing well or get worse. Document Released: 01/08/2013 Document Reviewed: 01/08/2013 [facility] Patient Information 2015 [place], [place]. This information is not intended to replace advice given to you by your health care provider. Make sure you discuss any questions you have with your health care provider.

## 2014-06-13 NOTE — ED Notes (Signed)
Pt seen at urologist Tuesday, dx with UTI, pt has been using straight cath for two weeks, today resistance met x2 with bloody urine.

## 2014-06-13 NOTE — ED Provider Notes (Addendum)
CSN: [id number]     Arrival date & time 06/13/14  1020 History  This chart was scribed for Virgel Manifold, MD by Stephania Fragmin, ED Scribe. This patient was seen in room APA11/APA11 and the patient's care was started at 12:50 PM.    Chief Complaint  Patient presents with  . Hematuria   Patient is a 81 y.o. male presenting with hematuria. The history is provided by the spouse. No language interpreter was used.  Hematuria This is a recurrent problem. The current episode started 6 to 12 hours ago. The problem occurs constantly. The problem has not changed since onset.Pertinent negatives include no chest pain, no abdominal pain, no headaches and no shortness of breath. Nothing aggravates the symptoms. Nothing relieves the symptoms. He has tried nothing for the symptoms.    HPI Comments: Jeffrey [name] is a 81 y.o. 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 His wife states he has been using a catheter for the past 1.5 weeks, and states that today was met with resistance twice and blood filled the catheter tube and seemed to be coming out of his penis. His wife denies any known clots. Patient saw the urologist 3 days ago. Called yesterday with positive urine culture and prescription for abx but hasn't filled yet.   Past Medical History  Diagnosis Date  . Tracheostomy in place   . Cancer 2010    laryngeal  . Lung cancer 2010  . Pneumonia 2014  . Dysrhythmia     takes Coreg and Lisinopril daily  . Shortness of breath     with exertion  . Enlarged prostate     takes flomax  . Urinary urgency   . Gastric ulcer     hx of  . Hx of transfusion of packed red blood cells     no abnormal reaction noted by patient  . Umbilical hernia   . Metastatic cancer 07/24/2012  . Hyperlipidemia   . ED (erectile dysfunction)   . Insomnia   .  CHF (congestive heart failure)   . 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 Tracheostomy  2010  . Lung removal, partial  2010    left side  . Layngectomy for laryngeal cancer in2010; left upper lobectomy for lung cancer, 2010.    . Back surgery      thinks it was done in 2012  . Esophagogastroduodenoscopy    . Colonoscopy    . Portacath placement Bilateral 07/12/2012    Procedure: INSERTION PORT-A-CATH;  Surgeon: [name], MD;  Location: [facility] OR;  Service: Thoracic;  Laterality: Bilateral;   Family History  Problem Relation Age of Onset  . Diabetes Father   . Heart disease Father    History  Substance Use Topics  . Smoking status: Current Every Day Smoker -- 1.00 packs/day for 70 years    Types: Cigarettes  . Smokeless tobacco: Never Used  . Alcohol Use: No    Review of Systems  Respiratory: Negative for shortness of breath.   Cardiovascular: Negative for chest pain.  Gastrointestinal: Negative for abdominal pain.  Genitourinary: Positive for hematuria.  Neurological: Negative for headaches.      Allergies  Review of patient's allergies indicates no known allergies.  Home Medications   Prior to Admission medications  Medication Sig Start Date End Date Taking? Authorizing Provider  Calcium Carb-Cholecalciferol (CALCIUM-VITAMIN D3) 600-500 MG-UNIT CAPS Take 1 capsule by mouth 3 (three) times daily with meals. 03/27/14  Yes Patrici Ranks, MD  carvedilol (COREG) 3.125 MG tablet Take 1 tablet (3.125 mg total) by mouth 2 (two) times daily with a meal. 05/16/14  Yes Mikey Kirschner, MD  cyanocobalamin (,VITAMIN B-12,) 1000 MCG/ML injection Inject 1 mL (1,000 mcg total) into the muscle every 30 (thirty) days. 05/25/14  Yes Erline Hau, MD  esomeprazole (NEXIUM) 20 MG capsule Take 20 mg by mouth daily as needed (indigestion).    Yes Historical Provider, MD  HYDROcodone-acetaminophen  (NORCO) 10-325 MG per tablet Take 1 tablet by mouth every 6 (six) hours as needed. Patient taking differently: Take 0.5-1 tablets by mouth every 6 (six) hours as needed for moderate pain.  04/28/14  Yes Baird Cancer, PA-C  levothyroxine (SYNTHROID) 50 MCG tablet Take 2 tablets (100 mcg total) by mouth daily before breakfast. 06/03/14  Yes Baird Cancer, PA-C  LORazepam (ATIVAN) 1 MG tablet TAKE ONE TABLET BY MOUTH AT BEDTIME AS NEEDED FOR ANXIETY 03/24/14  Yes Mikey Kirschner, MD  ondansetron (ZOFRAN) 8 MG tablet Take by mouth every 8 (eight) hours as needed for nausea or vomiting. Starting day of chemo as needed   Yes Historical Provider, MD  terazosin (HYTRIN) 5 MG capsule Take 1 or 2 capsules at bedtime for urinary hesitancy. 07/25/13  Yes Farrel Gobble, MD  Diphenhyd-Hydrocort-Nystatin (FIRST-DUKES MOUTHWASH) SUSP One tablespoon swish and spit QID Patient not taking: Reported on 06/13/2014 06/06/14   Mikey Kirschner, MD  megestrol (MEGACE) 400 MG/10ML suspension Take 20 mLs (800 mg total) by mouth daily. Patient not taking: Reported on 06/06/2014 04/28/14   Manon Hilding Kefalas, PA-C   BP 115/77 mmHg  Pulse 98  Temp(Src) 98.6 F (37 C) (Oral)  Resp 18  Wt 129 lb (58.514 kg)  SpO2 97% Physical Exam  Constitutional:  Sitting in bed awake. Chronically ill appearing, but not distressed.   HENT:  Head: Normocephalic and atraumatic.  Trach.   Eyes: Conjunctivae are normal. Right eye exhibits no discharge. Left eye exhibits no discharge.  Neck: Neck supple.  Cardiovascular: Normal rate, regular rhythm and normal heart sounds.  Exam reveals no gallop and no friction rub.   No murmur heard. Pulmonary/Chest: Effort normal and breath sounds normal. No respiratory distress.  Abdominal: Soft. He exhibits no distension. There is no tenderness.  Genitourinary:  Blood at urethral meatus.   Musculoskeletal: He exhibits no edema or tenderness.  Neurological: He is alert.  Skin: Skin is warm and  dry.  Psychiatric: He has a normal mood and affect. His behavior is normal. Thought content normal.  Nursing note and vitals reviewed.   ED Course  Procedures (including critical care time)  DIAGNOSTIC STUDIES: Oxygen Saturation is 97% on RA, normal by my interpretation.    COORDINATION OF CARE: 12:58 PM - Discussed treatment plan with pt at bedside which includes urine and blood tests, and pt agreed to plan.   Labs Review Labs Reviewed  CBC WITH DIFFERENTIAL/PLATELET - Abnormal; Notable for the following:    WBC 0.6 (*)    RBC 2.90 (*)    Hemoglobin 8.5 (*)    HCT 25.9 (*)    RDW 17.7 (*)    Neutrophils Relative % 83 (*)    Neutro Abs 0.5 (*)    Lymphocytes Relative 11 (*)    Lymphs  Abs 0.1 (*)    Monocytes Relative 0 (*)    Monocytes Absolute 0.0 (*)    Eosinophils Relative 6 (*)    All other components within normal limits  CULTURE, BLOOD (ROUTINE X 2)  CULTURE, BLOOD (ROUTINE X 2)  URINALYSIS, ROUTINE W REFLEX MICROSCOPIC (NOT AT [facility])  BASIC METABOLIC PANEL  LACTIC ACID, PLASMA  LACTIC ACID, PLASMA   Imaging Review No results found.   EKG Interpretation None      MDM   Final diagnoses:  Hematuria  Leukopenia  Sepsis, due to unspecified organism   Able to pass catheter easily in ED but bladder empty. Bleeding may be traumatic versus from UTI. H/H stable. Wife reports just called in abx by urologist for UTI.  Instructed they need to get this filled and start taking today. Hematuria may be 2/2 UTI.  Urine specimen today not likely to add much to clinical picture if culture just came back and has yet to initate abx. Leukopenia. ANC 500 but afebrile and generally appears ok. Smiling and upbeat. Looks much better looking than I last saw him when he was admitted. Followed by oncology and needs close follow-up. Discussed the need for immediate re-evaluation if develops fever.   Precipitous decline in ED. Initial plan was for DC, but not appropriate now. Now  difficult to arouse. HR 140s. Tachypneic. Rectal temp 103. Empiric abx for neutropenic fever. Urine likely source. Unfortunately unable to review culture data from urologist. Will additionally check CXR and blood cultures. Pt still Full Code. Needs admit.   Virgel Manifold, MD 06/13/14 [phone]

## 2014-06-13 NOTE — ED Notes (Signed)
EDP has been notified of BP and Temp again.

## 2014-06-13 NOTE — ED Notes (Signed)
Pt bp 72 systolic. notifed EDP, liter bolus infusing per verbal order. Pt remains warm with elevated temp  Family placed blankets on pt, nurse removed blankets and instructed pt and family to leave blankets off at this time due to elevated temp.

## 2014-06-14 DIAGNOSIS — A419 Sepsis, unspecified organism: Secondary | ICD-10-CM

## 2014-06-14 DIAGNOSIS — R310 Gross hematuria: Secondary | ICD-10-CM

## 2014-06-14 DIAGNOSIS — N390 Urinary tract infection, site not specified: Secondary | ICD-10-CM

## 2014-06-14 DIAGNOSIS — R6521 Severe sepsis with septic shock: Secondary | ICD-10-CM

## 2014-06-14 DIAGNOSIS — E43 Unspecified severe protein-calorie malnutrition: Secondary | ICD-10-CM

## 2014-06-14 DIAGNOSIS — D72819 Decreased white blood cell count, unspecified: Secondary | ICD-10-CM

## 2014-06-14 DIAGNOSIS — D62 Acute posthemorrhagic anemia: Secondary | ICD-10-CM

## 2014-06-14 LAB — COMPREHENSIVE METABOLIC PANEL
ALBUMIN: 2 g/dL — AB (ref 3.5–5.0)
ALT: 9 U/L — AB (ref 17–63)
ANION GAP: 7 (ref 5–15)
AST: 13 U/L — AB (ref 15–41)
Alkaline Phosphatase: 39 U/L (ref 38–126)
BUN: 26 mg/dL — ABNORMAL HIGH (ref 6–20)
CALCIUM: 6.7 mg/dL — AB (ref 8.9–10.3)
CO2: 20 mmol/L — ABNORMAL LOW (ref 22–32)
CREATININE: 1.48 mg/dL — AB (ref 0.61–1.24)
Chloride: 111 mmol/L (ref 101–111)
GFR calc Af Amer: 50 mL/min — ABNORMAL LOW (ref >60)
GFR calc non Af Amer: 44 mL/min — ABNORMAL LOW (ref >60)
GLUCOSE: 141 mg/dL — AB (ref 65–99)
POTASSIUM: 4 mmol/L (ref 3.5–5.1)
Sodium: 138 mmol/L (ref 135–145)
Total Bilirubin: 0.5 mg/dL (ref 0.3–1.2)
Total Protein: 5.1 g/dL — ABNORMAL LOW (ref 6.5–8.1)

## 2014-06-14 LAB — CBC
HCT: 21.2 % — ABNORMAL LOW (ref 39.0–52.0)
HEMOGLOBIN: 6.8 g/dL — AB (ref 13.0–17.0)
MCH: 29.2 pg (ref 26.0–34.0)
MCHC: 32.1 g/dL (ref 30.0–36.0)
MCV: 91 fL (ref 78.0–100.0)
Platelets: 228 10*3/uL (ref 150–400)
RBC: 2.33 MIL/uL — ABNORMAL LOW (ref 4.22–5.81)
RDW: 18.2 % — ABNORMAL HIGH (ref 11.5–15.5)
WBC: 9.8 10*3/uL (ref 4.0–10.5)

## 2014-06-14 LAB — HEMOGLOBIN AND HEMATOCRIT, BLOOD
HCT: 26.7 % — ABNORMAL LOW (ref 39.0–52.0)
Hemoglobin: 8.9 g/dL — ABNORMAL LOW (ref 13.0–17.0)

## 2014-06-14 LAB — TSH: TSH: 2.618 u[IU]/mL (ref 0.350–4.500)

## 2014-06-14 LAB — MRSA PCR SCREENING: MRSA BY PCR: NEGATIVE

## 2014-06-14 LAB — BRAIN NATRIURETIC PEPTIDE: B Natriuretic Peptide: 3212 pg/mL — ABNORMAL HIGH (ref 0.0–100.0)

## 2014-06-14 LAB — PREPARE RBC (CROSSMATCH)

## 2014-06-14 MED ORDER — DOXYCYCLINE HYCLATE 100 MG PO TABS: 100.0000 mg | ORAL_TABLET | Freq: Two times a day (BID) | ORAL | Status: DC

## 2014-06-14 MED ORDER — SODIUM CHLORIDE 0.9 % IV SOLN
Freq: Once | INTRAVENOUS | Status: AC
Start: 2014-06-14 — End: 2014-06-14
  Administered 2014-06-14: 09:00:00 via INTRAVENOUS

## 2014-06-14 MED ORDER — ACETAMINOPHEN 325 MG PO TABS: 650.0000 mg | ORAL_TABLET | Freq: Four times a day (QID) | ORAL | Status: DC | PRN

## 2014-06-14 MED ORDER — DOXYCYCLINE HYCLATE 100 MG PO TABS
100.0000 mg | ORAL_TABLET | Freq: Two times a day (BID) | ORAL | Status: DC
  Administered 2014-06-14 – 2014-06-16 (×5): 100 mg via ORAL
  Filled 2014-06-14 (×5): qty 1

## 2014-06-14 MED ORDER — ACETAMINOPHEN 500 MG PO TABS: 1000.0000 mg | ORAL_TABLET | Freq: Four times a day (QID) | ORAL | Status: DC

## 2014-06-14 MED ORDER — FUROSEMIDE 10 MG/ML IJ SOLN
20.0000 mg | Freq: Once | INTRAMUSCULAR | Status: AC
  Administered 2014-06-14: 20 mg via INTRAVENOUS
  Filled 2014-06-14: qty 2

## 2014-06-14 MED ORDER — SODIUM CHLORIDE 0.9 % IV BOLUS (SEPSIS)
1000.0000 mL | Freq: Once | INTRAVENOUS | Status: AC
  Administered 2014-06-14: 1000 mL via INTRAVENOUS

## 2014-06-14 NOTE — Progress Notes (Signed)
CRITICAL VALUE ALERT  Critical value received:  Blood culture gram positive rods  Date of notification:  06/14/14  Time of notification:  7588  Critical value read back: yes  Nurse who received alert:  Girard Cooter RN  MD notified (1st page):  Dr Florencia Reasons  Time of first page:  [phone]

## 2014-06-14 NOTE — Progress Notes (Signed)
The patient is receiving acetaminophen by the intravenous route.  Based on criteria approved by the Pharmacy and [place], the medication is being converted to the equivalent oral dose form.  These criteria include: -No Active GI bleeding -Able to tolerate diet of full liquids (or better) or tube feeding OR able to tolerate other medications by the oral or enteral route  If you have any questions about this conversion, please contact the Pharmacy Department (ext [phone]).  Thank you.  [name], [facility] 06/14/2014 7:52 AM

## 2014-06-14 NOTE — Progress Notes (Signed)
PROGRESS NOTE  Jeffrey [name] BDZ:[id number] DOB: 1933/12/31 DOA: 06/13/2014 PCP: [name], MD  Summary: 81 year old man with metastatic squamous cell carcinoma presented with hematuria, possible UTI. Precipitous decline in the emergency department developing septic shock. Admitted for septic shock, neutropenic fever, suspected UTI, hematuria. Admitting physician discussed CODE STATUS, plan for DO NOT RESUSCITATE/DO NOT INTUBATE, hospice consult.  Recently seen by his urologist and started on antibiotics. Using straight catheterization for 2 weeks.  Assessment/Plan: 1. Septic shock secondary to gram-negative rod bacteremia/presumed UTI. Lactic acid within normal limits. Blood pressure responding to volume. He appears better than one would expect. 2. Neutropenic fever. Neutropenia has resolved. 3. Hemorrhagic hematuria. Clear urine in Foley bag. No evidence of ongoing bleeding. 4. Acute blood loss anemia secondary to hemorrhagic hematuria. Appears to have stopped at this point. Currently being given 2 units packed red blood cells. 5. Non-anion gap metabolic acidosis 6. 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 7. Chronic systolic congestive heart failure, LVEF 25%, severe diffuse hypokinesis by 2-D echocardiogram 06/2012. BNP elevated. Appears well compensated. Lungs are clear. No lower extremity edema. 8. Chronic kidney disease stage III, at baseline. 9. Tick removed from left hip by EDP. Empiric doxycycline. 10. 5/28 hospitalization for uti/sepsic shock 11. Severe protein caloric malnutrition    The patient appears better than one would expect. Nevertheless he is critically ill and I have had a frank discussion with him and his wife at bedside. He does appear to be responding to treatment with antibiotics, blood, volume and plan to continue these modalities at this time.  Follow-up blood and urine culture to identify  species and target therapy.  Volume volume status closely but he is well compensated at this point.  Stress dose steroids  His wife understands he is critically ill and confirms discussion as documented by Dr. Marin Comment. DNR/DNI  Code Status: DNR/DNI DVT prophylaxis: SCDs Family Communication:  Disposition Plan: home  Murray Hodgkins, MD  [facility]  Pager [phone] If 7PM-7AM, please contact night-coverage at [email or web address], password [facility] 06/14/2014, 8:15 AM  LOS: 1 day   Consultants:    Procedures:  Transfusion 2 units packed red blood cells 5/28  Antibiotics:  Zosyn 5/27 >>  Vancomycin 5/27 >>  Doxycycline 5/27 >>  HPI/Subjective: Feeling much better. Denies pain, nausea, vomiting. Breathing well. No complaints.  Objective: Filed Vitals:   06/14/14 0645 06/14/14 0700 06/14/14 0726 06/14/14 0807  BP: '80/46 90/51 90/51 '$   Pulse: 86 80 79   Temp: 98 F (36.7 C) 98 F (36.7 C)  98.1 F (36.7 C)  TempSrc:    Oral  Resp: '22 13 20   '$ Height:      Weight:      SpO2: 100% 100% 100%     Intake/Output Summary (Last 24 hours) at 06/14/14 0815 Last data filed at 06/14/14 0745  Gross per 24 hour  Intake   4788 ml  Output   1950 ml  Net   2838 ml     Filed Weights   06/13/14 1034 06/13/14 2251 06/14/14 0425  Weight: 58.514 kg (129 lb) 55.9 kg (123 lb 3.8 oz) 59.1 kg (130 lb 4.7 oz)    Exam:     Afebrile, systolic blood pressure 90, no hypoxia. General:  Appears calm and comfortable, sitting up in bed. Surprisingly well-appearing.  Eyes: grossly normal  WLN:LGXQJJH normal hearing, lips & tongue Neck: no LAD, masses or thyromegaly. Tracheostomy appearance normal. Cardiovascular: RRR, no m/r/g. No  LE edema. Telemetry: SR, no arrhythmias  Respiratory: CTA bilaterally, no w/r/r. Normal respiratory effort. Abdomen: soft, ntnd Skin: no rash or induration noted Musculoskeletal: grossly normal tone BUE/BLE Psychiatric: grossly normal mood and  affect Neurologic: grossly non-focal.  New data reviewed:  Urine output 1950  +2.5 L since admission  Chronic kidney disease stable  WBC has rebounded to within normal limits, 9.8  Hemoglobin has decreased from 8.5 >>6.8. Platelet count norml  Pertinent data since admission:  Chronic kidney disease stable.  Urinalysis grossly positive  Chest x-ray no acute disease  Pending data:  Blood cultures--GNR  Urine culture  Scheduled Meds: . sodium chloride   Intravenous Once  . acetaminophen  1,000 mg Oral 4 times per day  . bacitracin      . cyanocobalamin  1,000 mcg Intramuscular Q30 days  . doxycycline  100 mg Oral Q12H  . hydrocortisone sod succinate (SOLU-CORTEF) inj  50 mg Intravenous Q8H  . levothyroxine  100 mcg Oral QAC breakfast  . LORazepam  0.5 mg Oral QHS  . pantoprazole  40 mg Oral Daily  . piperacillin-tazobactam (ZOSYN)  IV  3.375 g Intravenous Q8H  . sodium chloride  3 mL Intravenous Q12H  . vancomycin  750 mg Intravenous Q24H   Continuous Infusions: . 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

## 2014-06-15 LAB — TYPE AND SCREEN
ABO/RH(D): AB POS
Antibody Screen: NEGATIVE
UNIT DIVISION: 0
Unit division: 0

## 2014-06-15 LAB — CBC
HCT: 26.9 % — ABNORMAL LOW (ref 39.0–52.0)
Hemoglobin: 9 g/dL — ABNORMAL LOW (ref 13.0–17.0)
MCH: 29.5 pg (ref 26.0–34.0)
MCHC: 33.5 g/dL (ref 30.0–36.0)
MCV: 88.2 fL (ref 78.0–100.0)
Platelets: 259 10*3/uL (ref 150–400)
RBC: 3.05 MIL/uL — ABNORMAL LOW (ref 4.22–5.81)
RDW: 18.3 % — ABNORMAL HIGH (ref 11.5–15.5)
WBC: 11.3 10*3/uL — ABNORMAL HIGH (ref 4.0–10.5)

## 2014-06-15 LAB — BASIC METABOLIC PANEL
Anion gap: 10 (ref 5–15)
BUN: 29 mg/dL — ABNORMAL HIGH (ref 6–20)
CO2: 19 mmol/L — ABNORMAL LOW (ref 22–32)
CREATININE: 1.39 mg/dL — AB (ref 0.61–1.24)
Calcium: 6.5 mg/dL — ABNORMAL LOW (ref 8.9–10.3)
Chloride: 109 mmol/L (ref 101–111)
GFR calc Af Amer: 54 mL/min — ABNORMAL LOW (ref >60)
GFR calc non Af Amer: 47 mL/min — ABNORMAL LOW (ref >60)
GLUCOSE: 117 mg/dL — AB (ref 65–99)
POTASSIUM: 3.7 mmol/L (ref 3.5–5.1)
Sodium: 138 mmol/L (ref 135–145)

## 2014-06-15 NOTE — Progress Notes (Signed)
PROGRESS NOTE  Jeffrey [name] MGQ:[id number] DOB: 1933-07-03 DOA: 06/13/2014 PCP: [name], MD  Summary: 81 year old man with metastatic squamous cell carcinoma presented with hematuria, possible UTI. Precipitous decline in the emergency department developing septic shock. Admitted for septic shock, neutropenic fever, suspected UTI, hematuria.  Recently seen by his urologist and started on antibiotics. Using straight catheterization for 2 weeks.  Assessment/Plan: 1. Septic shock secondary to gram-negative rod bacteremia/presumed UTI. Lactic acid within normal limits. Dramatically improved, now normotensive. 2. Neutropenic fever. Neutropenia has resolved. 3. Hemorrhagic hematuria. Resolved. Hemoglobin stable. 4. Acute blood loss anemia secondary to hemorrhagic hematuria. Improved status post transfusion. 5. Non-anion gap metabolic acidosis. 6. Chronic kidney disease stage III, stable. 7. 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 8. Chronic systolic congestive heart failure, LVEF 25%, severe diffuse hypokinesis by 2-D echocardiogram 06/2012. BNP elevated. Appears stable.  9. Chronic kidney disease stage III, at baseline. 10. Tick removed from left hip by EDP. Empiric doxycycline. LFTs unremarkable. 11. 5/28 hospitalization for uti/septic shock 12. Severe protein caloric malnutrition    Dramatically improved. Normotensive.  Continue Zosyn, stop vancomycin, follow-up blood and urine culture.  Add sodium bicarbonate  BMP, CBC in the morning.  Discontinue telemetry.  Transfer to the medical floor.  Discussed with wife at bedside.  Code Status: DNR/DNI DVT prophylaxis: SCDs Family Communication:  Disposition Plan: home  Murray Hodgkins, MD  [facility]  Pager [phone] If 7PM-7AM, please contact night-coverage at [email or web address], password [email or web address] 06/15/2014, 8:02 AM  LOS: 2 days    Consultants:    Procedures:  Transfusion 2 units packed red blood cells 5/28  Antibiotics:  Zosyn 5/27 >>  Vancomycin 5/27 >>  Doxycycline 5/27 >>  HPI/Subjective: Feeling well. No complaints. No pain, shortness of breath nausea or vomiting.  Objective: Filed Vitals:   06/15/14 0400 06/15/14 0500 06/15/14 0600 06/15/14 0700  BP: 101/64 101/65 102/59 108/70  Pulse: 70 39 69 72  Temp: 98.7 F (37.1 C) 98.6 F (37 C) 98.6 F (37 C) 98.4 F (36.9 C)  TempSrc:      Resp: '17 16  14  '$ Height:      Weight:  62.5 kg (137 lb 12.6 oz)    SpO2: 99% 100% 98% 99%    Intake/Output Summary (Last 24 hours) at 06/15/14 0802 Last data filed at 06/15/14 0500  Gross per 24 hour  Intake    810 ml  Output   1250 ml  Net   -440 ml     Filed Weights   06/13/14 2251 06/14/14 0425 06/15/14 0500  Weight: 55.9 kg (123 lb 3.8 oz) 59.1 kg (130 lb 4.7 oz) 62.5 kg (137 lb 12.6 oz)    Exam:    Afebrile, VSS, now normotensive General:  Appears calm and comfortable Cardiovascular: RRR, no m/r/g. No LE edema. Telemetry: SR, no arrhythmias  Respiratory: CTA bilaterally, no w/r/r. Normal respiratory effort. Musculoskeletal: grossly normal tone BUE/BLE Psychiatric: grossly normal mood and affect, speech fluent and appropriate  New data reviewed:  Urine output 1250  +2.4 L since admission  Chronic kidney disease stable/improved  WBC 11.3  Hemoglobin up to 9.0 s/p transfusion. Platelet count normal.  Pertinent data since admission:  Chronic kidney disease stable.  Urinalysis grossly positive  Chest x-ray no acute disease  Pending data:  Blood cultures--GNR 2/2  Urine culture  Scheduled Meds: . doxycycline  100 mg Oral Q12H  . hydrocortisone sod succinate (SOLU-CORTEF) inj  50 mg Intravenous Q8H  .  levothyroxine  100 mcg Oral QAC breakfast  . LORazepam  0.5 mg Oral QHS  . pantoprazole  40 mg Oral Daily  . piperacillin-tazobactam (ZOSYN)  IV  3.375 g Intravenous Q8H   . sodium chloride  3 mL Intravenous Q12H  . 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

## 2014-06-15 NOTE — Progress Notes (Signed)
Patient transferred to room 335. Report given to Ophelia Shoulder RN. Vital signs stable at transfer.

## 2014-06-15 NOTE — Progress Notes (Signed)
Pharmacist Heart Failure Core Measure Documentation  Assessment: Jeffrey [name] has an EF documented as 25% on 06/21/12 by ECHO.  Rationale: Heart failure patients with left ventricular systolic dysfunction (LVSD) and an EF < 40% should be prescribed an angiotensin converting enzyme inhibitor (ACEI) or angiotensin receptor blocker (ARB) at discharge unless a contraindication is documented in the medical record.  This patient is not currently on an ACEI or ARB for HF.  This note is being placed in the record in order to provide documentation that a contraindication to the use of these agents is present for this encounter.  ACE Inhibitor or Angiotensin Receptor Blocker is contraindicated (specify all that apply)  '[]'$   ACEI allergy AND ARB allergy '[]'$   Angioedema '[]'$   Moderate or severe aortic stenosis '[]'$   Hyperkalemia '[]'$   Hypotension '[]'$   Renal artery stenosis '[x]'$   Worsening renal function, preexisting renal disease or dysfunction   [name] 06/15/2014 10:37 AM

## 2014-06-16 LAB — BASIC METABOLIC PANEL
ANION GAP: 9 (ref 5–15)
BUN: 26 mg/dL — ABNORMAL HIGH (ref 6–20)
CHLORIDE: 109 mmol/L (ref 101–111)
CO2: 20 mmol/L — AB (ref 22–32)
Calcium: 6.5 mg/dL — ABNORMAL LOW (ref 8.9–10.3)
Creatinine, Ser: 1.37 mg/dL — ABNORMAL HIGH (ref 0.61–1.24)
GFR calc Af Amer: 55 mL/min — ABNORMAL LOW (ref >60)
GFR calc non Af Amer: 48 mL/min — ABNORMAL LOW (ref >60)
Glucose, Bld: 109 mg/dL — ABNORMAL HIGH (ref 65–99)
Potassium: 3.2 mmol/L — ABNORMAL LOW (ref 3.5–5.1)
Sodium: 138 mmol/L (ref 135–145)

## 2014-06-16 LAB — CBC
HCT: 30.1 % — ABNORMAL LOW (ref 39.0–52.0)
Hemoglobin: 9.9 g/dL — ABNORMAL LOW (ref 13.0–17.0)
MCH: 28.9 pg (ref 26.0–34.0)
MCHC: 32.9 g/dL (ref 30.0–36.0)
MCV: 88 fL (ref 78.0–100.0)
Platelets: 261 10*3/uL (ref 150–400)
RBC: 3.42 MIL/uL — ABNORMAL LOW (ref 4.22–5.81)
RDW: 17.8 % — AB (ref 11.5–15.5)
WBC: 9.7 10*3/uL (ref 4.0–10.5)

## 2014-06-16 LAB — CULTURE, BLOOD (ROUTINE X 2)

## 2014-06-16 LAB — URINE CULTURE

## 2014-06-16 MED ORDER — POTASSIUM CHLORIDE CRYS ER 20 MEQ PO TBCR
40.0000 meq | EXTENDED_RELEASE_TABLET | Freq: Once | ORAL | Status: AC
  Administered 2014-06-16: 40 meq via ORAL
  Filled 2014-06-16: qty 2

## 2014-06-16 MED ORDER — DEXTROSE 5 % IV SOLN
2.0000 g | INTRAVENOUS | Status: DC
  Administered 2014-06-16: 2 g via INTRAVENOUS
  Filled 2014-06-16: qty 2

## 2014-06-16 MED ORDER — DOXYCYCLINE HYCLATE 100 MG PO TABS: 100.0000 mg | ORAL_TABLET | Freq: Two times a day (BID) | ORAL | Status: DC

## 2014-06-16 MED ORDER — CEFUROXIME AXETIL 500 MG PO TABS: 500.0000 mg | ORAL_TABLET | Freq: Two times a day (BID) | ORAL | Status: DC

## 2014-06-16 MED ORDER — CEFTRIAXONE SODIUM IN DEXTROSE 40 MG/ML IV SOLN
2.0000 g | INTRAVENOUS | Status: DC
  Filled 2014-06-16: qty 50

## 2014-06-16 MED ORDER — HEPARIN SOD (PORK) LOCK FLUSH 100 UNIT/ML IV SOLN
500.0000 [IU] | Freq: Once | INTRAVENOUS | Status: AC
  Administered 2014-06-16: 500 [IU] via INTRAVENOUS
  Filled 2014-06-16: qty 5

## 2014-06-16 NOTE — Progress Notes (Signed)
ANTIBIOTIC CONSULT NOTE  Pharmacy Consult for Zosyn Indication: rule out sepsis  No Known Allergies  Patient Measurements: Height: '5\' 7"'$  (170.2 cm) Weight: 137 lb 12.6 oz (62.5 kg) IBW/kg (Calculated) : 66.1  Vital Signs: Temp: 98.9 F (37.2 C) (05/30 0653) Temp Source: Oral (05/30 0653) BP: 155/66 mmHg (05/30 0653) Pulse Rate: 76 (05/30 0653) Intake/Output from previous day: 05/29 0701 - 05/30 0700 In: 340 [P.O.:240; IV Piggyback:100] Out: 2050 [Urine:2050] Intake/Output from this shift:    Labs:  Recent Labs  06/14/14 0525 06/14/14 1714 06/15/14 0526 06/16/14 0553  WBC 9.8  --  11.3* 9.7  HGB 6.8* 8.9* 9.0* 9.9*  PLT 228  --  259 261  CREATININE 1.48*  --  1.39* 1.37*   Estimated Creatinine Clearance: 39.3 mL/min (by C-G formula based on Cr of 1.37). No results for input(s): VANCOTROUGH, VANCOPEAK, VANCORANDOM, GENTTROUGH, GENTPEAK, GENTRANDOM, TOBRATROUGH, TOBRAPEAK, TOBRARND, AMIKACINPEAK, AMIKACINTROU, AMIKACIN in the last 72 hours.   Microbiology: Recent Results (from the past 720 hour(s))  Culture, blood (routine x 2)     Status: None   Collection Time: 05/20/14  6:35 PM  Result Value Ref Range Status   Specimen Description BLOOD RIGHT ARM  Final   Special Requests BOTTLES DRAWN AEROBIC AND ANAEROBIC [facility] EACH  Final   Culture   Final    LACTOBACILLUS SPECIES Note: Standardized susceptibility testing for this organism is not available. Note: Gram Stain Report Called to,Read Back By and Verified With: SARA [facility] 05/23/2014 11:50AM BY [place] Performed at [facility]    Report Status 05/25/2014 FINAL  Final  Culture, blood (routine x 2)     Status: None   Collection Time: 05/20/14  6:40 PM  Result Value Ref Range Status   Specimen Description BLOOD  Final   Special Requests NONE  Final   Culture  Setup Time   Final    GRAM POSITIVE RODS Gram Stain Report Called to,Read Back By and Verified With: [place] [id number] ON [id number] BY WOODS, M    Culture   Final    DIPHTHEROIDS(CORYNEBACTERIUM SPECIES) Note: Standardized susceptibility testing for this organism is not available. Note: Performed at [facility] Gram Stain Report Called to,Read Back By and Verified With: HEATH S [id number] 05/23/14 BY WOODSM Performed at [facility]    Report Status 05/25/2014 FINAL  Final  Urine culture     Status: None   Collection Time: 05/20/14  9:16 PM  Result Value Ref Range Status   Specimen Description URINE, CLEAN CATCH  Final   Special Requests NONE  Final   Colony Count   Final    6,000 COLONIES/ML Performed at [facility]    Culture   Final    INSIGNIFICANT GROWTH Performed at [facility]    Report Status 05/22/2014 FINAL  Final  MRSA PCR Screening     Status: None   Collection Time: 05/21/14  1:00 AM  Result Value Ref Range Status   MRSA by PCR NEGATIVE NEGATIVE Final    Comment:        The GeneXpert MRSA Assay (FDA approved for NASAL specimens only), is one component of a comprehensive MRSA colonization surveillance program. It is not intended to diagnose MRSA infection nor to guide or monitor treatment for MRSA infections.   Blood culture (routine x 2)     Status: None   Collection Time: 06/13/14  1:55 PM  Result Value Ref Range Status   Specimen Description BLOOD  Final   Special Requests   Final    BOTTLES DRAWN AEROBIC AND ANAEROBIC AER 8CC ANA 6CC   Culture   Final    KLEBSIELLA PNEUMONIAE Note: SUSCEPTIBILITIES PERFORMED ON PREVIOUS CULTURE WITHIN THE LAST 5 DAYS. Note: Gram Stain Report Called to,Read Back By and Verified With: A.STONE AT [id number] ON [id number] BY S.VANHOORNE Performed at [facility] Performed at [facility]    Report Status 06/16/2014 FINAL  Final  Blood culture (routine x 2)     Status: None   Collection Time: 06/13/14  2:00 PM  Result Value Ref Range Status   Specimen Description RIGHT ANTECUBITAL  Final   Special Requests   Final    BOTTLES DRAWN  AEROBIC AND ANAEROBIC AER 12CC ANA 10CC   Culture   Final    KLEBSIELLA PNEUMONIAE Note: Gram Stain Report Called to,Read Back By and Verified With: NIELSON T. AT [id number] ON [id number] BY THOMPSON S. Performed at [facility] Culture results may be compromised due to an excessive volume of blood received in culture bottles. Performed at [facility]    Report Status 06/16/2014 FINAL  Final   Organism ID, Bacteria KLEBSIELLA PNEUMONIAE  Final      Susceptibility   Klebsiella pneumoniae - MIC*    AMPICILLIN >=32 RESISTANT Resistant     AMPICILLIN/SULBACTAM 16 INTERMEDIATE Intermediate     CEFAZOLIN <=4 SENSITIVE Sensitive     CEFEPIME <=1 SENSITIVE Sensitive     CEFTAZIDIME <=1 SENSITIVE Sensitive     CEFTRIAXONE <=1 SENSITIVE Sensitive     CIPROFLOXACIN <=0.25 SENSITIVE Sensitive     GENTAMICIN <=1 SENSITIVE Sensitive     IMIPENEM <=0.25 SENSITIVE Sensitive     PIP/TAZO 16 SENSITIVE Sensitive     TOBRAMYCIN <=1 SENSITIVE Sensitive     TRIMETH/SULFA <=20 SENSITIVE Sensitive     * KLEBSIELLA PNEUMONIAE  Urine culture     Status: None (Preliminary result)   Collection Time: 06/13/14  9:00 PM  Result Value Ref Range Status   Specimen Description URINE, CATHETERIZED  Final   Special Requests NONE  Final   Colony Count   Final    >=100,000 COLONIES/ML Performed at [place] Performed at [facility]    Report Status PENDING  Incomplete  MRSA PCR Screening     Status: None   Collection Time: 06/13/14 10:27 PM  Result Value Ref Range Status   MRSA by PCR NEGATIVE NEGATIVE Final    Comment:        The GeneXpert MRSA Assay (FDA approved for NASAL specimens only), is one component of a comprehensive MRSA colonization surveillance program. It is not intended to diagnose MRSA infection nor to guide or monitor treatment for MRSA infections.     Anti-infectives    Start     Dose/Rate Route Frequency Ordered  Stop   06/14/14 1800  vancomycin (VANCOCIN) IVPB 750 mg/150 ml premix  Status:  Discontinued     750 mg 150 mL/hr over 60 Minutes Intravenous Every 24 hours 06/13/14 2240 06/15/14 0950   06/14/14 1000  doxycycline (VIBRA-TABS) tablet 100 mg     100 mg Oral Every 12 hours 06/14/14 0113 06/24/14 0959   06/14/14 0115  doxycycline (VIBRA-TABS) tablet 100 mg  Status:  Discontinued     100 mg Oral Every 12 hours 06/14/14 0112 06/14/14 0114   06/13/14 1600  piperacillin-tazobactam (ZOSYN) IVPB 3.375 g  3.375 g 100 mL/hr over 30 Minutes Intravenous  Once 06/13/14 1550 06/13/14 1641   06/13/14 1600  vancomycin (VANCOCIN) IVPB 1000 mg/200 mL premix     1,000 mg 200 mL/hr over 60 Minutes Intravenous  Once 06/13/14 1558 06/13/14 1712   06/13/14 0000  piperacillin-tazobactam (ZOSYN) IVPB 3.375 g     3.375 g 12.5 mL/hr over 240 Minutes Intravenous Every 8 hours 06/13/14 2240        Assessment: 80 yo M with hx of metastatic lung CA diagnosed with UTI at urologist office 3 days ago, but never got antibiotic filled.  A tick was also removed from patient.    Empiric, broad-spectrum antibiotics initiated in ED for urosepsis.  Blood cx +Klebsiella and Urine cx also growing GNR.  Vanc was d/c'd.   Patient is clinically improved.   Scr elevated from patient's baseline.  Estimated CrCl ~ 35-40 ml/min.    Goal of Therapy:  Eradicate infection.  Plan:  Continue Zosyn 3.375gm IV Q8h to be infused over 4hrs Monitor renal function and cx data  Duration of therapy per MD- consider de-escalate to Rocephin  Jereme Loren, [place] Drafts 06/16/2014,9:51 AM

## 2014-06-16 NOTE — Progress Notes (Signed)
PROGRESS NOTE  Jeffrey [name] CEY:[id number] DOB: February 26, 1933 DOA: 06/13/2014 PCP: [name], MD  Summary: 81 year old man with metastatic squamous cell carcinoma presented with hematuria, possible UTI. Precipitous decline in the emergency department developing septic shock. Admitted for septic shock, neutropenic fever, suspected UTI, hematuria.  Recently seen by his urologist and started on antibiotics. Using straight catheterization for 2 weeks.  Assessment/Plan: 1. Septic shock secondary to gram-negative rod bacteremia/presumed UTI, suspect related to self catheterization straight catheter. Lactic acid within normal limits. Resolved. Now hypertensive. 2. Neutropenic fever. Resolved. 3. Hemorrhagic hematuria. Resolved. Hemoglobin remains a stable. 4. Acute blood loss anemia secondary to hemorrhagic hematuria. Remains stable status post transfusion. 5. Non-anion gap metabolic acidosis. Improving. Expect spontaneous resolution. 6. 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 7. Chronic systolic congestive heart failure, LVEF 25%, severe diffuse hypokinesis by 2-D echocardiogram 06/2012. BNP elevated. Well-compensated 8. Chronic kidney disease stage III, at baseline. 9. Tick removed from left hip by EDP. Empiric doxycycline. LFTs unremarkable. 10. Severe protein caloric malnutrition    Doing very well, now hypertensive, afebrile. Wants to go home.  Wife at bedside, we discussed all issues and workup. Plan for IV ceftriaxone today and then home with close outpt follow-up  Ceftin to complete course  Leave foley in place, f/u with Dr. Gaynelle Arabian   Replace potassium  Murray Hodgkins, MD  [facility]  Pager [phone] If 7PM-7AM, please contact night-coverage at [email or web address], password [email or web address] 06/16/2014, 12:12 PM  LOS: 3 days   Consultants:    Procedures:  Transfusion 2 units packed red blood cells  5/28  Antibiotics:  Zosyn 5/27 >> 5/30   Vancomycin 5/27 >> 5/29  Doxycycline 5/27 >> 6/3  Ceftriaxone 5/30  Ceftin 5/31 >> 6/6  HPI/Subjective: Feels good, no pain, no n/v. Eating well. Wants to go home.  Objective: Filed Vitals:   06/15/14 1300 06/15/14 1400 06/15/14 2313 06/16/14 0653  BP: 113/63 104/69 109/70 155/66  Pulse: 66 77 68 76  Temp: 98.8 F (37.1 C) 98.9 F (37.2 C) 98 F (36.7 C) 98.9 F (37.2 C)  TempSrc:   Oral Oral  Resp: '16 22 22 22  '$ Height:      Weight:      SpO2: 99% 99% 100% 97%    Intake/Output Summary (Last 24 hours) at 06/16/14 1212 Last data filed at 06/16/14 0654  Gross per 24 hour  Intake    290 ml  Output   2050 ml  Net  -1760 ml     Filed Weights   06/13/14 2251 06/14/14 0425 06/15/14 0500  Weight: 55.9 kg (123 lb 3.8 oz) 59.1 kg (130 lb 4.7 oz) 62.5 kg (137 lb 12.6 oz)    Exam:    Afebrile, VSS, no hypoxia General:  Appears comfortable, calm. Vigorous. Cardiovascular: Regular rate and rhythm, no murmur, rub or gallop. No lower extremity edema. Respiratory: Clear to auscultation bilaterally, no wheezes, rales or rhonchi. Normal respiratory effort. Psychiatric: grossly normal mood and affect, speech fluent and appropriate  New data reviewed:  Urine output 2050  +1.7 L since admission  Basic metabolic panel unremarkable except potassium 3.2. Chronic kidney disease stable  WBC has normalized, 9.7.  Hemoglobin improving, 9.9.  Pertinent data since admission:  Chronic kidney disease stable.  Blood cultures Klebsiella pneumoniae sensitive to ceftriaxone  Chest x-ray no acute disease  Pending data:  Urine culture--GNR 2/2  Scheduled Meds: . doxycycline  100 mg Oral Q12H  . hydrocortisone sod succinate (  SOLU-CORTEF) inj  50 mg Intravenous Q8H  . levothyroxine  100 mcg Oral QAC breakfast  . LORazepam  0.5 mg Oral QHS  . pantoprazole  40 mg Oral Daily  . piperacillin-tazobactam (ZOSYN)  IV  3.375 g Intravenous  Q8H  . 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

## 2014-06-16 NOTE — Care Management Note (Signed)
Case Management Note  Patient Details  Name: Jeffrey [name] MRN: [id number] Date of Birth: 06/14/1933  Expected Discharge Date:  06/15/14               Expected Discharge Plan:  Home/Self Care  In-House Referral:  NA  Discharge planning Services  CM Consult  Post Acute Care Choice:  NA Choice offered to:  NA  DME Arranged:    DME Agency:     HH Arranged:    [place] Agency:     Status of Service:  Completed, signed off  Medicare Important Message Given:  Yes Date Medicare IM Given:  06/16/14 Medicare IM give by:  Jolene Provost, RN, MSN, CM  Date Additional Medicare IM Given:    Additional Medicare Important Message give by:     If discussed at [place] of Stay Meetings, dates discussed:    Additional Comments: Pt is from home, lives with wife and is independent at baseline. Pt has no HH services or DME needs prior to admission. Pt plans to DC home today with self care. No CM needs at the time of discharge.   Sherald Barge, RN 06/16/2014, 3:14 PM

## 2014-06-16 NOTE — Clinical Documentation Improvement (Signed)
  Cause and effect relationships cannot be assumed and must be documented by the physician, if they exist.  Please document if there is a probable, likely, suspected or known cause and effect relationship between the patient's straight caths and the diagnosis of sepsis:   - Sepsis, likely, suspected or known to be 2/2 straight urinary catheterization.  - There is no cause and effect relationship  - Other cause and effect relationship  - Unable to clinically determine  Thank You, Erling Conte ,RN Clinical Documentation Specialist:  [place] [employer] [facility]

## 2014-06-16 NOTE — Progress Notes (Signed)
Discharge instructions and medications reviewed, verbalized understanding. Escorted to lobby by nursing staff in stable condition.

## 2014-06-16 NOTE — Discharge Summary (Signed)
Physician Discharge Summary  Jeffrey [name] LNL:[id number] DOB: 1933-11-20 DOA: 06/13/2014  PCP: [name], MD  Admit date: 06/13/2014 Discharge date: 06/16/2014  Recommendations for Outpatient Follow-up:  1. Resolution of Klebsiella pneumoniae UTI  2. Foley catheter to remain in place until follow-up with urologist (already scheduled). Wife well experienced with Foley catheter care. 3. Severe protein calorie malnutrition.    Follow-up Information    Schedule an appointment as soon as possible for a visit with KEFALAS,THOMAS, PA-C.   Specialty:  Physician Assistant   Contact information:   [address] [place] [address] [phone]       Follow up with Ailene Rud, MD.   Specialty:  Urology   Why:  as scheduled   Contact information:   [place] [address] [phone]       Follow up with [name], MD. Schedule an appointment as soon as possible for a visit in 1 week.   Specialty:  Family Medicine   Contact information:   [address] [place] [address] [phone]      Discharge Diagnoses:  1. Septic shock secondary to Klebsiella pneumoniae UTI/bacteremia, suspect related to self-catheterization with straight catheter 2. Neutropenic fever  3. Hemorrhagic hematuria 4. Acute blood loss anemia 5. Non-anion gap metabolic acidosis 6. 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 Chronic systolic congestive heart failure, stable 8. Chronic kidney disease stage III 9. Severe protein calorie malnutrition  Discharge Condition: improvd Disposition: home  Diet recommendation: regular  Filed Weights   06/13/14 2251 06/14/14 0425 06/15/14 0500  Weight: 55.9 kg (123 lb 3.8 oz) 59.1 kg (130 lb 4.7 oz) 62.5 kg (137 lb 12.6 oz)    History of present illness:  81 year old man with metastatic squamous cell carcinoma presented with hematuria, possible UTI. Precipitous decline in the  emergency department developing septic shock. Admitted for septic shock, neutropenic fever, suspected UTI, hematuria. Recently seen by his urologist and started on antibiotics. Using straight catheterization for 2 weeks.  Hospital Course:  Mr. Teters was admitted to the stepdown unit and treated with aggressive antibiotics and fluid resuscitation as well as blood products with remarkable and rapid improvement. Condition clinically stabilized. Cultures grew out Klebsiella pneumonia and antibiotics were narrowed. Neutropenia quickly recovered and he had no hematuria while admitted. Acute blood loss anemia responded well to blood products and has not recurred. He is tolerating a diet, feels well, remains afebrile and is now stable for discharge. Individual issues as below  1. Septic shock secondary to gram-negative rod bacteremia/presumed UTI, suspect related to self catheterization straight catheter. Lactic acid within normal limits. Resolved. Now hypertensive. 2. Neutropenic fever. Resolved. 3. Hemorrhagic hematuria. Likely secondary to trauma from straight catheter. Resolved. Hemoglobin remains stable. 4. Acute blood loss anemia secondary to hemorrhagic hematuria. Remains stable status post transfusion. 5. Non-anion gap metabolic acidosis. Improving. Expect spontaneous resolution. 6. 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 7. Chronic systolic congestive heart failure, LVEF 25%, severe diffuse hypokinesis by 2-D echocardiogram 06/2012. BNP elevated. Well-compensated 8. Chronic kidney disease stage III, at baseline. 9. Tick removed from left hip by EDP. Empiric doxycycline. LFTs unremarkable. 10. Severe protein caloric malnutrition   Ceftin to complete course  Leave foley in place, f/u with Dr. Gaynelle Arabian   Consultants:  none  Procedures:  Transfusion 2 units packed red blood cells 5/28  Antibiotics:  Zosyn 5/27 >>  5/30  Vancomycin 5/27 >> 5/29  Doxycycline 5/27 >> 6/3  Ceftriaxone 5/30  Ceftin 5/31 >> 6/6  Discharge Instructions  Discharge Instructions    Activity as tolerated - No restrictions    Complete by:  As directed      Diet general    Complete by:  As directed      Discharge instructions    Complete by:  As directed   Call your physician or seek immediate medical attention for fever, weakness, vomiting, shortness of breath or worsening of condition.          Current Discharge Medication List    START taking these medications   Details  cefUROXime (CEFTIN) 500 MG tablet Take 1 tablet (500 mg total) by mouth 2 (two) times daily with a meal. Start 5/31 in evening. Qty: 14 tablet, Refills: 0    doxycycline (VIBRA-TABS) 100 MG tablet Take 1 tablet (100 mg total) by mouth every 12 (twelve) hours. Qty: 8 tablet, Refills: 0      CONTINUE these medications which have NOT CHANGED   Details  Calcium Carb-Cholecalciferol (CALCIUM-VITAMIN D3) 600-500 MG-UNIT CAPS Take 1 capsule by mouth 3 (three) times daily with meals. Qty: 90 capsule, Refills: 4   Associated Diagnoses: Metastatic cancer; Hypocalcemia    carvedilol (COREG) 3.125 MG tablet Take 1 tablet (3.125 mg total) by mouth 2 (two) times daily with a meal. Qty: 180 tablet, Refills: 0    cyanocobalamin (,VITAMIN B-12,) 1000 MCG/ML injection Inject 1 mL (1,000 mcg total) into the muscle every 30 (thirty) days. Qty: 1 mL, Refills: 0    esomeprazole (NEXIUM) 20 MG capsule Take 20 mg by mouth daily as needed (indigestion).     HYDROcodone-acetaminophen (NORCO) 10-325 MG per tablet Take 1 tablet by mouth every 6 (six) hours as needed. Qty: 60 tablet, Refills: 0   Associated Diagnoses: History of lung cancer; Metastatic cancer    levothyroxine (SYNTHROID) 50 MCG tablet Take 2 tablets (100 mcg total) by mouth daily before breakfast. Qty: 180 tablet, Refills: 1   Associated Diagnoses: Hypothyroidism, unspecified hypothyroidism  type    LORazepam (ATIVAN) 1 MG tablet TAKE ONE TABLET BY MOUTH AT BEDTIME AS NEEDED FOR ANXIETY Qty: 30 tablet, Refills: 3    ondansetron (ZOFRAN) 8 MG tablet Take by mouth every 8 (eight) hours as needed for nausea or vomiting. Starting day of chemo as needed    terazosin (HYTRIN) 5 MG capsule Take 1 or 2 capsules at bedtime for urinary hesitancy. Qty: 60 capsule, Refills: 6      STOP taking these medications     Diphenhyd-Hydrocort-Nystatin (FIRST-DUKES MOUTHWASH) SUSP      megestrol (MEGACE) 400 MG/10ML suspension        No Known Allergies  The results of significant diagnostics from this hospitalization (including imaging, microbiology, ancillary and laboratory) are listed below for reference.    Significant Diagnostic Studies: Dg Chest Portable 1 View  06/13/2014   CLINICAL DATA:  Hematuria  EXAM: PORTABLE CHEST - 1 VIEW  COMPARISON:  05/20/2014  FINDINGS: Cardiomediastinal silhouette is stable. Right subclavian Port-A-Cath is unchanged in position. No acute infiltrate or pulmonary edema. Old left rib fractures are again noted. Again noted chronic blunting of the left costophrenic angle. Stable postsurgical changes post left lower lobectomy.  IMPRESSION: No active disease. No significant change. Again noted chronic blunting of left costophrenic angle and status post left lower lobectomy. Old left rib fractures. Stable right subclavian Port-A-Cath position.   Electronically Signed   By: Lahoma Crocker  M.D.   On: 06/13/2014 16:29    Microbiology: Recent Results (from the past 240 hour(s))  Blood culture (routine x 2)     Status: None   Collection Time: 06/13/14  1:55 PM  Result Value Ref Range Status   Specimen Description BLOOD  Final   Special Requests   Final    BOTTLES DRAWN AEROBIC AND ANAEROBIC AER 8CC ANA 6CC   Culture   Final    KLEBSIELLA PNEUMONIAE Note: SUSCEPTIBILITIES PERFORMED ON PREVIOUS CULTURE WITHIN THE LAST 5 DAYS. Note: Gram Stain Report Called to,Read Back  By and Verified With: A.STONE AT [id number] ON [id number] BY S.VANHOORNE Performed at [facility] Performed at [facility]    Report Status 06/16/2014 FINAL  Final  Blood culture (routine x 2)     Status: None   Collection Time: 06/13/14  2:00 PM  Result Value Ref Range Status   Specimen Description RIGHT ANTECUBITAL  Final   Special Requests   Final    BOTTLES DRAWN AEROBIC AND ANAEROBIC AER 12CC ANA 10CC   Culture   Final    KLEBSIELLA PNEUMONIAE Note: Gram Stain Report Called to,Read Back By and Verified With: NIELSON T. AT [id number] ON [id number] BY THOMPSON S. Performed at [facility] Culture results may be compromised due to an excessive volume of blood received in culture bottles. Performed at [facility]    Report Status 06/16/2014 FINAL  Final   Organism ID, Bacteria KLEBSIELLA PNEUMONIAE  Final      Susceptibility   Klebsiella pneumoniae - MIC*    AMPICILLIN >=32 RESISTANT Resistant     AMPICILLIN/SULBACTAM 16 INTERMEDIATE Intermediate     CEFAZOLIN <=4 SENSITIVE Sensitive     CEFEPIME <=1 SENSITIVE Sensitive     CEFTAZIDIME <=1 SENSITIVE Sensitive     CEFTRIAXONE <=1 SENSITIVE Sensitive     CIPROFLOXACIN <=0.25 SENSITIVE Sensitive     GENTAMICIN <=1 SENSITIVE Sensitive     IMIPENEM <=0.25 SENSITIVE Sensitive     PIP/TAZO 16 SENSITIVE Sensitive     TOBRAMYCIN <=1 SENSITIVE Sensitive     TRIMETH/SULFA <=20 SENSITIVE Sensitive     * KLEBSIELLA PNEUMONIAE  Urine culture     Status: None (Preliminary result)   Collection Time: 06/13/14  9:00 PM  Result Value Ref Range Status   Specimen Description URINE, CATHETERIZED  Final   Special Requests NONE  Final   Colony Count   Final    >=100,000 COLONIES/ML Performed at [place] Performed at [facility]    Report Status PENDING  Incomplete  MRSA PCR Screening     Status: None   Collection Time: 06/13/14 10:27 PM  Result Value Ref Range  Status   MRSA by PCR NEGATIVE NEGATIVE Final    Comment:        The GeneXpert MRSA Assay (FDA approved for NASAL specimens only), is one component of a comprehensive MRSA colonization surveillance program. It is not intended to diagnose MRSA infection nor to guide or monitor treatment for MRSA infections.      Labs: Basic Metabolic Panel:  Recent Labs Lab 06/13/14 1355 06/14/14 0525 06/15/14 0526 06/16/14 0553  NA 136 138 138 138  K 4.4 4.0 3.7 3.2*  CL 102 111 109 109  CO2 24 20* 19* 20*  GLUCOSE 103* 141* 117* 109*  BUN 28* 26* 29* 26*  CREATININE 1.38* 1.48* 1.39* 1.37*  CALCIUM 8.0*  6.7* 6.5* 6.5*   Liver Function Tests:  Recent Labs Lab 06/14/14 0525  AST 13*  ALT 9*  ALKPHOS 39  BILITOT 0.5  PROT 5.1*  ALBUMIN 2.0*   CBC:  Recent Labs Lab 06/13/14 1353 06/14/14 0525 06/14/14 1714 06/15/14 0526 06/16/14 0553  WBC 0.6* 9.8  --  11.3* 9.7  NEUTROABS 0.5*  --   --   --   --   HGB 8.5* 6.8* 8.9* 9.0* 9.9*  HCT 25.9* 21.2* 26.7* 26.9* 30.1*  MCV 89.3 91.0  --  88.2 88.0  PLT 219 228  --  259 261       Recent Labs  05/08/14 1215 06/14/14 0525  BNP 326.0* 3212.0*       Principal Problem:   Septic shock Active Problems:   Tracheostomy in place   Cardiomyopathy   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

## 2014-06-25 ENCOUNTER — Encounter (HOSPITAL_COMMUNITY): Payer: Self-pay | Admitting: Oncology

## 2014-06-25 ENCOUNTER — Encounter: Payer: Self-pay | Admitting: Family Medicine

## 2014-06-25 ENCOUNTER — Ambulatory Visit (INDEPENDENT_AMBULATORY_CARE_PROVIDER_SITE_OTHER): Payer: Managed Care, Other (non HMO) | Admitting: Family Medicine

## 2014-06-25 ENCOUNTER — Encounter (HOSPITAL_COMMUNITY): Payer: Managed Care, Other (non HMO) | Attending: Oncology | Admitting: Oncology

## 2014-06-25 VITALS — BP 111/77 | HR 100 | Temp 98.2°F | Resp 20 | Wt 127.4 lb

## 2014-06-25 VITALS — BP 128/82 | Ht 67.0 in | Wt 128.0 lb

## 2014-06-25 DIAGNOSIS — G4700 Insomnia, unspecified: Secondary | ICD-10-CM

## 2014-06-25 DIAGNOSIS — C799 Secondary malignant neoplasm of unspecified site: Secondary | ICD-10-CM

## 2014-06-25 DIAGNOSIS — Z85118 Personal history of other malignant neoplasm of bronchus and lung: Secondary | ICD-10-CM | POA: Insufficient documentation

## 2014-06-25 DIAGNOSIS — C7951 Secondary malignant neoplasm of bone: Secondary | ICD-10-CM

## 2014-06-25 DIAGNOSIS — R918 Other nonspecific abnormal finding of lung field: Secondary | ICD-10-CM | POA: Insufficient documentation

## 2014-06-25 DIAGNOSIS — C801 Malignant (primary) neoplasm, unspecified: Secondary | ICD-10-CM | POA: Insufficient documentation

## 2014-06-25 MED ORDER — HYDROCODONE-ACETAMINOPHEN 10-325 MG PO TABS: 1.0000 | ORAL_TABLET | Freq: Four times a day (QID) | ORAL | Status: DC | PRN

## 2014-06-25 NOTE — Progress Notes (Signed)
   Subjective:    Patient ID: Jeffrey [name], male    DOB: 07/15/1933, 81 y.o.   MRN: [id number]  HPI Patient arrives for a follow up from recent hospitalization for UTI and sepsis. Patient finished antibiotic yest-Patient now has   Overall notes some improved energy level. Still significant cough at times. Followed by oncologist for his metastatic lung cancer.  No major pain at this time that does not respond to current pain medicines.  Still requires lorazepam night to help sleep. States definitely needs this.   A full time leg cath and has follow up with urologist.   Review of Systems No abdominal pain no fever no chills no dysuria    Objective:   Physical Exam Alert vitals stable no acute distress. Lungs clear. Heart regular in rhythm H&T normal ankles without edema       Assessment & Plan:  Impression #1 urosepsis clinically resolved #2 insomnia discussed #3 metastatic cancer discussed plan diet exercise discussed. Nutritional supplement discussed. Maintain same medications. Follow-up as scheduled [facility]

## 2014-06-25 NOTE — Progress Notes (Signed)
[place] at [facility]  Discharge Instructions:  Bowel Regimen:  If no bowel movement for two days, take one dose of Milk of Magnesia (over the counter) or one dose of Miralax (also over the counter).  Call us if that is not effective.  Return to see Gershon Mussel in July. _______________________________________________________________  Thank you for choosing [place] at [facility] to provide your oncology and hematology care.  To afford each patient quality time with our providers, please arrive at least 15 minutes before your scheduled appointment.  You need to re-schedule your appointment if you arrive 10 or more minutes late.  We strive to give you quality time with our providers, and arriving late affects you and other patients whose appointments are after yours.  Also, if you no show three or more times for appointments you may be dismissed from the clinic.  Again, thank you for choosing [place] at [place] hope is that these requests will allow you access to exceptional care and in a timely manner. _______________________________________________________________  If you have questions after your visit, please contact our office at [phone] between the hours of 8:30 a.m. and 5:00 p.m. Voicemails left after 4:30 p.m. will not be returned until the following business day. _______________________________________________________________  For prescription refill requests, have your pharmacy contact our office. _______________________________________________________________  Recommendations made by the consultant and any test results will be sent to your referring physician. _______________________________________________________________

## 2014-06-25 NOTE — Patient Instructions (Addendum)
[place] at [facility]  Discharge Instructions:  Bowel Regimen:  If no bowel movement for two days, take one dose of Milk of Magnesia (over the counter) or one dose of Miralax (also over the counter).  Call us if that is not effective.  Return to see Gershon Mussel in July. _______________________________________________________________  Thank you for choosing [place] at [facility] to provide your oncology and hematology care.  To afford each patient quality time with our providers, please arrive at least 15 minutes before your scheduled appointment.  You need to re-schedule your appointment if you arrive 10 or more minutes late.  We strive to give you quality time with our providers, and arriving late affects you and other patients whose appointments are after yours.  Also, if you no show three or more times for appointments you may be dismissed from the clinic.  Again, thank you for choosing [place] at [place] hope is that these requests will allow you access to exceptional care and in a timely manner. _______________________________________________________________  If you have questions after your visit, please contact our office at [phone] between the hours of 8:30 a.m. and 5:00 p.m. Voicemails left after 4:30 p.m. will not be returned until the following business day. _______________________________________________________________

## 2014-06-25 NOTE — Progress Notes (Signed)
Jeffrey Hillier, MD [address] [place] [address]  History of lung cancer - Plan: HYDROcodone-acetaminophen ([place]) 10-325 MG per tablet  Metastatic cancer - Plan: HYDROcodone-acetaminophen ([place]) 10-325 MG per tablet  CURRENT THERAPY: Progressive metastatic squamous cell carcinoma, S/P Nivolumab failure.  INTERVAL HISTORY: Jeffrey [name] 81 y.o. 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      Metastatic cancer   02/17/2011 Imaging PET scan- B/L hypermetabolic pulmonary nodules (3 in total) are most consistent with metastatic disease. No evidence of mediastinal nodal metastasis.  No evidence of metastasis in the abdomen or pelvis.   06/19/2012 Imaging CT scan of chest- Slight enlargement of pulmonary nodules/metastasis.    07/31/2012 - 11/12/2012 Chemotherapy Carboplatin/Paclitaxel x 6 cycles   11/26/2012 Imaging CT CAP- Similar size of a R apical pulm nodule.  Similar to improved left lower lobe nodularity, favored to be post infectious or inflammatory. Similar nodularity in the right middle lobe. No evidence of new or progressive disease. Similar borderline adeno    12/10/2012 - 05/07/2013 Chemotherapy Tarceva 150 mg daily   05/03/2013 Progression CT CAP- Interval increase in size of right upper lobe adjacent pulmonary nodules is highly concerning for progression of lung cancer recurrence.   07/31/2013 - 04/10/2014 Chemotherapy Nivolumab   10/21/2013 Imaging CT CAP- The dominant right apical mass is slightly increased in size from previous exam. The adjacent nodule is stable.   10/31/2013 Imaging CT of head- Intermediate density subdural collection/hematoma along the convexity on the right, maximal thickness 7 mm. 2 mm of right-to-left shift. No hyper acute hemorrhage.  Lytic lesion right posterior frontal calvarium, new since the previous study.   12/09/2013 Progression MRI brain- R calvarial lytic metastasis  involving underlying dura and overlying scalp soft tissues, increased October. Widespread underlying dural thickening and enhancement in the R hemisphere suspicious for meningeal involvement.   12/24/2013 Imaging CT CAP- No clear enlargement of right apical mass. Stable right middle lobe nodule. Mild mediastinal lymphadenopathy.   12/26/2013 Procedure Spinal CSF testing- negative for malignancy.   01/14/2014 Imaging Skeletal survey- Lytic lesion noted in the right frontoparietal region as noted on recent head CT of 10/31/2013. This is consist with metastatic disease. No other focal bony lesions noted.   03/25/2014 Imaging focal soft tissue mass 7.5 x 3.4 x 6.4 cm in size with associated small area of osseous destruction of the medial LEFT scapula. This most likely represents an osseous metastasis with a large extraosseous soft tissue component.   04/07/2014 Progression Soft Tissue Needle Core Biopsy, Left scapular and adjacent tissues - METASTATIC SQUAMOUS CELL CARCINOMA.   04/29/2014 - 05/16/2014 Radiation Therapy XRT with Dr. Lisbeth Renshaw x 14 fractions.   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  The results are noted within this dictation.  Chart reviewed and hospitalizations noted.  Jeffrey [name] reports that Jeffrey [name] has a urology appointment next week for evaluation of bladder control and then a follow-up appointment with urologist at the end of June.  Jeffrey [name] reports that Jeffrey [name] has not thought much about treatment options with recent events consuming most of the month of May 2016.    "  I am in no hurry.  We're all dying anyway right?"  With his recent hospitalizations, Jeffrey [name] may be starting to face his own mortality.  I've provided him treatment  options: 1. Erbitux 2. Xeloda 3. Erbitux + Xeloda 4. Navelbine 5. Hospice/palliative care.  Jeffrey [name] is most appropriate for #5 but Jeffrey [name] has historically denied his mortality.    Jeffrey [name] wants to wait until July to talk about treatment.  Jeffrey [name] notes a significant episode of constipation and therefore we discussed a bowel regimen.  Oncologically, Jeffrey [name] denies any complaints and ROS questioning is negative.  Past Medical History  Diagnosis Date  . Tracheostomy in place   . Cancer 2010    laryngeal  . Lung cancer 2010  . Pneumonia 2014  . Dysrhythmia     takes Coreg and Lisinopril daily  . Shortness of breath     with exertion  . Enlarged prostate     takes flomax  . Urinary urgency   . Gastric ulcer     hx of  . Hx of transfusion of packed red blood cells     no abnormal reaction noted by patient  . Umbilical hernia   . Metastatic cancer 07/24/2012  . Hyperlipidemia   . ED (erectile dysfunction)   . Insomnia   . CHF (congestive heart failure)   . 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     has No Known Allergies.  Jeffrey [name] had no medications administered during this visit.  Past  Surgical History  Procedure Laterality Date  . Tracheostomy  2010  . Lung removal, partial  2010    left side  . Layngectomy for laryngeal cancer in2010; left upper lobectomy for lung cancer, 2010.    . Back surgery      thinks it was done in 2012  . Esophagogastroduodenoscopy    . Colonoscopy    . Portacath placement Bilateral 07/12/2012    Procedure: INSERTION PORT-A-CATH;  Surgeon: [name], MD;  Location: [facility] OR;  Service: Thoracic;  Laterality: Bilateral;    Denies any headaches, dizziness, double vision, fevers, chills, night sweats, nausea, vomiting, diarrhea, constipation, chest pain, heart palpitations, shortness of breath, blood in stool, black tarry stool, urinary pain, urinary burning, urinary frequency, hematuria.   PHYSICAL EXAMINATION  ECOG PERFORMANCE STATUS: 1 - Symptomatic but completely ambulatory  Filed Vitals:   06/25/14 1350  BP: 111/77  Pulse: 100  Temp: 98.2 F (36.8 C)  Resp: 20    GENERAL:alert, no distress, well nourished, well developed, comfortable, cooperative, smiling and accompanied by his wife. SKIN: skin color, texture, turgor are normal, no rashes or significant lesions HEAD: Normocephalic, No masses, lesions, tenderness or abnormalities EYES: normal, PERRLA, EOMI, Conjunctiva are pink and non-injected EARS: External ears normal OROPHARYNX:lips, buccal mucosa, and tongue normal and mucous membranes are moist  NECK: supple, trachea midline LYMPH:  No lymph nodes noted in neck BREAST:not examined LUNGS: CTA B/L  HEART: RRR ABDOMEN:+ BS x 4.  Nontender.  BACK: Back symmetric, no curvature. EXTREMITIES:less then 2 second capillary refill, no skin discoloration, no cyanosis  NEURO: alert & oriented x 3 with fluent speech, no focal motor/sensory deficits, gait normal   LABORATORY DATA: CBC    Component Value Date/Time   WBC 9.7 06/16/2014 0553   RBC 3.42* 06/16/2014 0553   RBC 3.05* 05/21/2014 1254   HGB 9.9* 06/16/2014 0553    HCT 30.1* 06/16/2014 0553   PLT 261 06/16/2014 0553   MCV 88.0 06/16/2014 0553   MCH 28.9 06/16/2014 0553   MCHC 32.9 06/16/2014 0553   RDW 17.8* 06/16/2014 0553   LYMPHSABS 0.1* 06/13/2014 1353   MONOABS 0.0* 06/13/2014 1353   EOSABS 0.0 06/13/2014 1353   BASOSABS 0.0 06/13/2014 1353      Chemistry      Component Value Date/Time   NA 138 06/16/2014 0553   K 3.2* 06/16/2014 0553   CL 109 06/16/2014 0553   CO2 20* 06/16/2014 0553   BUN 26* 06/16/2014 0553   CREATININE 1.37* 06/16/2014 0553   CREATININE 1.46* 11/12/2012 0839      Component Value Date/Time   CALCIUM 6.5* 06/16/2014 0553   CALCIUM 8.5 01/26/2012 1312   ALKPHOS 39 06/14/2014 0525   AST 13* 06/14/2014 0525   ALT 9* 06/14/2014 0525   BILITOT 0.5 06/14/2014 0525        ASSESSMENT AND PLAN:  Metastatic cancer Progressive squamous cell carcinoma of head and neck with failure in therapy with Nivolumab.  S/P palliative radiation to left scapular lesion with excellent clinical response.  Long discussion regarding treatment options including single-agent Erbitux, single-agent Xeloda, Combination therapy with Erbitux and Xeloda, single-agent Navelbine, and Hospice.  Jeffrey [name] would be best served with Hospice, but Jeffrey [name] is experiencing a difficult time facing his mortality and his cancer.  In no confusing way, I re-enforced the fact that Jeffrey [name] will die of his cancer, further treatment options typically do not work as well, and at some point, his malignancy will cause his demise.  Jeffrey [name] would like to wait until after urology appointment to discuss treatment.    "I am dying anyway."  Rx for Hydrocodone.  Return beginning of July for follow-up.     THERAPY PLAN:  We will re-convene in July 2016 regarding treatment options at the patient's request.  All questions were answered. The patient knows to call the clinic with any problems, questions or concerns. We can certainly see the patient much sooner if necessary.  Patient  and plan discussed with Dr. Ancil Linsey and she is in agreement with the aforementioned.   This note is electronically signed by: Robynn Pane 06/25/2014 2:43 PM

## 2014-06-25 NOTE — Assessment & Plan Note (Addendum)
Progressive squamous cell carcinoma of head and neck with failure in therapy with Nivolumab.  S/P palliative radiation to left scapular lesion with excellent clinical response.  Long discussion regarding treatment options including single-agent Erbitux, single-agent Xeloda, Combination therapy with Erbitux and Xeloda, single-agent Navelbine, and Hospice.  He would be best served with Hospice, but Jeffrey [name] is experiencing a difficult time facing his mortality and his cancer.  In no confusing way, I re-enforced the fact that he will die of his cancer, further treatment options typically do not work as well, and at some point, his malignancy will cause his demise.  He would like to wait until after urology appointment to discuss treatment.    "I am dying anyway."  Rx for Hydrocodone.  Return beginning of July for follow-up.

## 2014-07-03 ENCOUNTER — Encounter (HOSPITAL_COMMUNITY): Payer: Managed Care, Other (non HMO) | Attending: Hematology & Oncology

## 2014-07-03 ENCOUNTER — Encounter (HOSPITAL_BASED_OUTPATIENT_CLINIC_OR_DEPARTMENT_OTHER): Payer: Managed Care, Other (non HMO)

## 2014-07-03 ENCOUNTER — Encounter (HOSPITAL_COMMUNITY): Payer: Self-pay

## 2014-07-03 VITALS — BP 104/58 | HR 95 | Resp 18

## 2014-07-03 DIAGNOSIS — C799 Secondary malignant neoplasm of unspecified site: Secondary | ICD-10-CM

## 2014-07-03 DIAGNOSIS — Z85118 Personal history of other malignant neoplasm of bronchus and lung: Secondary | ICD-10-CM | POA: Diagnosis present

## 2014-07-03 DIAGNOSIS — R918 Other nonspecific abnormal finding of lung field: Secondary | ICD-10-CM | POA: Diagnosis present

## 2014-07-03 DIAGNOSIS — C801 Malignant (primary) neoplasm, unspecified: Secondary | ICD-10-CM

## 2014-07-03 DIAGNOSIS — C7951 Secondary malignant neoplasm of bone: Secondary | ICD-10-CM | POA: Diagnosis not present

## 2014-07-03 LAB — COMPREHENSIVE METABOLIC PANEL
ALK PHOS: 52 U/L (ref 38–126)
ALT: 8 U/L — ABNORMAL LOW (ref 17–63)
AST: 10 U/L — AB (ref 15–41)
Albumin: 2.8 g/dL — ABNORMAL LOW (ref 3.5–5.0)
Anion gap: 8 (ref 5–15)
BILIRUBIN TOTAL: 0.5 mg/dL (ref 0.3–1.2)
BUN: 19 mg/dL (ref 6–20)
CHLORIDE: 98 mmol/L — AB (ref 101–111)
CO2: 27 mmol/L (ref 22–32)
Calcium: 9.7 mg/dL (ref 8.9–10.3)
Creatinine, Ser: 1.28 mg/dL — ABNORMAL HIGH (ref 0.61–1.24)
GFR calc Af Amer: >60 mL/min (ref >60)
GFR, EST NON AFRICAN AMERICAN: 52 mL/min — AB (ref >60)
GLUCOSE: 102 mg/dL — AB (ref 65–99)
POTASSIUM: 4.6 mmol/L (ref 3.5–5.1)
SODIUM: 133 mmol/L — AB (ref 135–145)
Total Protein: 6.8 g/dL (ref 6.5–8.1)

## 2014-07-03 LAB — CBC WITH DIFFERENTIAL/PLATELET
Basophils Absolute: 0 10*3/uL (ref 0.0–0.1)
Basophils Relative: 1 % (ref 0–1)
EOS PCT: 2 % (ref 0–5)
Eosinophils Absolute: 0.1 10*3/uL (ref 0.0–0.7)
HEMATOCRIT: 31.4 % — AB (ref 39.0–52.0)
Hemoglobin: 10.2 g/dL — ABNORMAL LOW (ref 13.0–17.0)
LYMPHS PCT: 10 % — AB (ref 12–46)
Lymphs Abs: 0.6 10*3/uL — ABNORMAL LOW (ref 0.7–4.0)
MCH: 29.2 pg (ref 26.0–34.0)
MCHC: 32.5 g/dL (ref 30.0–36.0)
MCV: 90 fL (ref 78.0–100.0)
MONO ABS: 0.5 10*3/uL (ref 0.1–1.0)
Monocytes Relative: 8 % (ref 3–12)
Neutro Abs: 5.2 10*3/uL (ref 1.7–7.7)
Neutrophils Relative %: 79 % — ABNORMAL HIGH (ref 43–77)
Platelets: 235 10*3/uL (ref 150–400)
RBC: 3.49 MIL/uL — ABNORMAL LOW (ref 4.22–5.81)
RDW: 16.6 % — ABNORMAL HIGH (ref 11.5–15.5)
WBC: 6.4 10*3/uL (ref 4.0–10.5)

## 2014-07-03 LAB — TSH: TSH: 1.461 u[IU]/mL (ref 0.350–4.500)

## 2014-07-03 MED ORDER — DENOSUMAB 120 MG/1.7ML ~~LOC~~ SOLN
120.0000 mg | Freq: Once | SUBCUTANEOUS | Status: AC
  Administered 2014-07-03: 120 mg via SUBCUTANEOUS
  Filled 2014-07-03: qty 1.7

## 2014-07-03 NOTE — Patient Instructions (Signed)
[facility] Discharge Instructions  RECOMMENDATIONS MADE BY THE CONSULTANT AND ANY TEST RESULTS WILL BE SENT TO YOUR REFERRING PHYSICIAN.  Xgeva injection today. Return as scheduled for office visit on 07/23/14.   Thank you for choosing [facility] to provide your oncology and hematology care.  To afford each patient quality time with our provider, please arrive at least 15 minutes before your scheduled appointment time.    You need to re-schedule your appointment should you arrive 10 or more minutes late.  We strive to give you quality time with our providers, and arriving late affects you and other patients whose appointments are after yours.  Also, if you no show three or more times for appointments you may be dismissed from the clinic at the providers discretion.     Again, thank you for choosing [facility].  Our hope is that these requests will decrease the amount of time that you wait before being seen by our physicians.       _____________________________________________________________  Should you have questions after your visit to [facility], please contact our office at [phone] between the hours of 8:30 a.m. and 4:30 p.m.  Voicemails left after 4:30 p.m. will not be returned until the following business day.  For prescription refill requests, have your pharmacy contact our office.

## 2014-07-03 NOTE — Progress Notes (Signed)
Jeffrey [name]'s reason for visit today is for an injection and labs as scheduled per MD orders.  Labs were drawn prior to administration of ordered medication.  Jeffrey [name] also received x-geva in abdomen sq per MD orders; see [facility] for administration details.  Jeffrey [name] tolerated all procedures well and without incident; questions were answered and patient was discharged.

## 2014-07-04 NOTE — Progress Notes (Signed)
Lab draw

## 2014-07-06 ENCOUNTER — Encounter: Payer: Self-pay | Admitting: Family Medicine

## 2014-07-23 ENCOUNTER — Emergency Department (HOSPITAL_COMMUNITY): Payer: Managed Care, Other (non HMO)

## 2014-07-23 ENCOUNTER — Emergency Department (HOSPITAL_COMMUNITY)
Admission: EM | Admit: 2014-07-23 | Discharge: 2014-07-23 | Disposition: A | Discharge status: Home / Self Care | Payer: Managed Care, Other (non HMO) | Attending: Emergency Medicine | Admitting: Emergency Medicine

## 2014-07-23 ENCOUNTER — Ambulatory Visit (HOSPITAL_COMMUNITY): Payer: Medicare Other | Admitting: Oncology

## 2014-07-23 ENCOUNTER — Encounter (HOSPITAL_COMMUNITY): Payer: Self-pay | Admitting: Emergency Medicine

## 2014-07-23 DIAGNOSIS — Z8701 Personal history of pneumonia (recurrent): Secondary | ICD-10-CM | POA: Insufficient documentation

## 2014-07-23 DIAGNOSIS — I509 Heart failure, unspecified: Secondary | ICD-10-CM | POA: Insufficient documentation

## 2014-07-23 DIAGNOSIS — Z87438 Personal history of other diseases of male genital organs: Secondary | ICD-10-CM | POA: Diagnosis not present

## 2014-07-23 DIAGNOSIS — Z720 Tobacco use: Secondary | ICD-10-CM | POA: Diagnosis not present

## 2014-07-23 DIAGNOSIS — G4700 Insomnia, unspecified: Secondary | ICD-10-CM | POA: Insufficient documentation

## 2014-07-23 DIAGNOSIS — Z8619 Personal history of other infectious and parasitic diseases: Secondary | ICD-10-CM | POA: Diagnosis not present

## 2014-07-23 DIAGNOSIS — Z930 Tracheostomy status: Secondary | ICD-10-CM | POA: Diagnosis not present

## 2014-07-23 DIAGNOSIS — Z85830 Personal history of malignant neoplasm of bone: Secondary | ICD-10-CM | POA: Insufficient documentation

## 2014-07-23 DIAGNOSIS — C329 Malignant neoplasm of larynx, unspecified: Secondary | ICD-10-CM | POA: Insufficient documentation

## 2014-07-23 DIAGNOSIS — R630 Anorexia: Secondary | ICD-10-CM | POA: Diagnosis not present

## 2014-07-23 DIAGNOSIS — I499 Cardiac arrhythmia, unspecified: Secondary | ICD-10-CM | POA: Insufficient documentation

## 2014-07-23 DIAGNOSIS — N390 Urinary tract infection, site not specified: Secondary | ICD-10-CM

## 2014-07-23 DIAGNOSIS — Z79899 Other long term (current) drug therapy: Secondary | ICD-10-CM | POA: Diagnosis not present

## 2014-07-23 DIAGNOSIS — C3490 Malignant neoplasm of unspecified part of unspecified bronchus or lung: Secondary | ICD-10-CM | POA: Insufficient documentation

## 2014-07-23 DIAGNOSIS — C799 Secondary malignant neoplasm of unspecified site: Secondary | ICD-10-CM

## 2014-07-23 DIAGNOSIS — R4182 Altered mental status, unspecified: Secondary | ICD-10-CM | POA: Diagnosis present

## 2014-07-23 LAB — COMPREHENSIVE METABOLIC PANEL
ALBUMIN: 3 g/dL — AB (ref 3.5–5.0)
ALK PHOS: 59 U/L (ref 38–126)
ALT: 7 U/L — AB (ref 17–63)
AST: 10 U/L — AB (ref 15–41)
Anion gap: 6 (ref 5–15)
BUN: 19 mg/dL (ref 6–20)
CHLORIDE: 103 mmol/L (ref 101–111)
CO2: 23 mmol/L (ref 22–32)
Calcium: 8 mg/dL — ABNORMAL LOW (ref 8.9–10.3)
Creatinine, Ser: 1.65 mg/dL — ABNORMAL HIGH (ref 0.61–1.24)
GFR, EST AFRICAN AMERICAN: 44 mL/min — AB (ref >60)
GFR, EST NON AFRICAN AMERICAN: 38 mL/min — AB (ref >60)
Glucose, Bld: 94 mg/dL (ref 65–99)
POTASSIUM: 4.8 mmol/L (ref 3.5–5.1)
SODIUM: 132 mmol/L — AB (ref 135–145)
TOTAL PROTEIN: 7.1 g/dL (ref 6.5–8.1)
Total Bilirubin: 0.1 mg/dL — ABNORMAL LOW (ref 0.3–1.2)

## 2014-07-23 LAB — URINE MICROSCOPIC-ADD ON

## 2014-07-23 LAB — URINALYSIS, ROUTINE W REFLEX MICROSCOPIC
Bilirubin Urine: NEGATIVE
GLUCOSE, UA: NEGATIVE mg/dL
Ketones, ur: NEGATIVE mg/dL
NITRITE: NEGATIVE
Protein, ur: 100 mg/dL — AB
SPECIFIC GRAVITY, URINE: 1.02 (ref 1.005–1.030)
Urobilinogen, UA: 0.2 mg/dL (ref 0.0–1.0)
pH: 6.5 (ref 5.0–8.0)

## 2014-07-23 LAB — CBC
HCT: 32.1 % — ABNORMAL LOW (ref 39.0–52.0)
Hemoglobin: 10.4 g/dL — ABNORMAL LOW (ref 13.0–17.0)
MCH: 28.7 pg (ref 26.0–34.0)
MCHC: 32.4 g/dL (ref 30.0–36.0)
MCV: 88.7 fL (ref 78.0–100.0)
PLATELETS: 327 10*3/uL (ref 150–400)
RBC: 3.62 MIL/uL — ABNORMAL LOW (ref 4.22–5.81)
RDW: 15.4 % (ref 11.5–15.5)
WBC: 9.3 10*3/uL (ref 4.0–10.5)

## 2014-07-23 MED ORDER — CEPHALEXIN 500 MG PO CAPS: 500.0000 mg | ORAL_CAPSULE | Freq: Four times a day (QID) | ORAL | Status: AC

## 2014-07-23 MED ORDER — CEPHALEXIN 500 MG PO CAPS
500.0000 mg | ORAL_CAPSULE | Freq: Once | ORAL | Status: AC
  Administered 2014-07-23: 500 mg via ORAL
  Filled 2014-07-23: qty 1

## 2014-07-23 NOTE — ED Notes (Signed)
MD Lacinda Axon at bedside.

## 2014-07-23 NOTE — Discharge Instructions (Signed)
You have a mild urinary tract infection. Prescription for antibiotic. Follow-up your primary care doctor. I requested a social work  Scientific laboratory technician.  RN will explain mechanism.

## 2014-07-23 NOTE — ED Notes (Addendum)
Pt family reports pt has had increased confusion,loss of appetite since yesterday. Pt family reports pt" has been trying to pull catheter out." pt family also reports pt fell last Friday and hit head but denies loc. Small abrasion noted in triage.

## 2014-07-23 NOTE — ED Notes (Signed)
MD Lacinda Axon at bedside updating pt and family.

## 2014-07-23 NOTE — ED Provider Notes (Signed)
CSN: [id number]     Arrival date & time 07/23/14  1723 History   First MD Initiated Contact with Patient 07/23/14 1948     Chief Complaint  Patient presents with  . Altered Mental Status     (Consider location/radiation/quality/duration/timing/severity/associated sxs/prior Treatment) HPI..... Level V caveat for altered mental status. Patient has known metastatic lung and laryngeal cancer. Family reports increased confusion and loss of appetite in the past 24 hours. He has a urinary catheter. He fell and hit his head several days ago. No fever, sweats, chills, cough, stiff neck.  Past Medical History  Diagnosis Date  . Tracheostomy in place   . Cancer 2010    laryngeal  . Lung cancer 2010  . Pneumonia 2014  . Dysrhythmia     takes Coreg and Lisinopril daily  . Shortness of breath     with exertion  . Enlarged prostate     takes flomax  . Urinary urgency   . Gastric ulcer     hx of  . Hx of transfusion of packed red blood cells     no abnormal reaction noted by patient  . Umbilical hernia   . Metastatic cancer 07/24/2012  . Hyperlipidemia   . ED (erectile dysfunction)   . Insomnia   . CHF (congestive heart failure)   . 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 Tracheostomy  2010  . Lung removal, partial  2010    left side  . Layngectomy for laryngeal cancer in2010; left upper lobectomy for lung cancer, 2010.    . Back surgery      thinks it was done in 2012  . Esophagogastroduodenoscopy    . Colonoscopy    . Portacath placement Bilateral 07/12/2012    Procedure: INSERTION PORT-A-CATH;  Surgeon: [name], MD;  Location: [facility] OR;  Service: Thoracic;  Laterality: Bilateral;   Family History  Problem Relation Age of Onset  . Diabetes Father   . Heart disease Father    History  Substance Use Topics  . Smoking status: Current Every Day Smoker -- 1.00 packs/day for 70 years    Types: Cigarettes   . Smokeless tobacco: Never Used  . Alcohol Use: No    Review of Systems  Unable to perform ROS: Other      Allergies  Review of patient's allergies indicates no known allergies.  Home Medications   Prior to Admission medications   Medication Sig Start Date End Date Taking? Authorizing Provider  Calcium Carb-Cholecalciferol (CALCIUM-VITAMIN D3) 600-500 MG-UNIT CAPS Take 1 capsule by mouth 3 (three) times daily with meals. Patient taking differently: Take 2 capsules by mouth 2 (two) times daily.  03/27/14  Yes Patrici Ranks, MD  carvedilol (COREG) 3.125 MG tablet Take 1 tablet (3.125 mg total) by mouth 2 (two) times daily with a meal. 05/16/14  Yes Mikey Kirschner, MD  cyanocobalamin (,VITAMIN B-12,) 1000 MCG/ML injection Inject 1 mL (1,000 mcg total) into the muscle every 30 (thirty) days. 05/25/14  Yes Erline Hau, MD  esomeprazole (NEXIUM) 20 MG capsule Take 20 mg by mouth daily as needed (indigestion).    Yes Historical Provider, MD  HYDROcodone-acetaminophen ([place]) 10-325 MG per tablet Take 1 tablet by mouth every 6 (six) hours as needed. Patient taking differently: Take 0.5-1 tablets by mouth every 6 (six) hours as needed for moderate pain or severe pain.  06/25/14  Yes Baird Cancer,  PA-C  levothyroxine (SYNTHROID) 50 MCG tablet Take 2 tablets (100 mcg total) by mouth daily before breakfast. 06/03/14  Yes Baird Cancer, PA-C  LORazepam (ATIVAN) 1 MG tablet TAKE ONE TABLET BY MOUTH AT BEDTIME AS NEEDED FOR ANXIETY 03/24/14  Yes Mikey Kirschner, MD  megestrol (MEGACE) 40 MG/ML suspension Take 200 mg by mouth every other day. PRN for appetite   Yes Historical Provider, MD  ondansetron (ZOFRAN) 8 MG tablet Take by mouth every 8 (eight) hours as needed for nausea or vomiting. Starting day of chemo as needed   Yes Historical Provider, MD  terazosin (HYTRIN) 5 MG capsule Take 1 or 2 capsules at bedtime for urinary hesitancy. Patient taking differently: Take 5 mg by  mouth at bedtime.  07/25/13  Yes Farrel Gobble, MD  cephALEXin (KEFLEX) 500 MG capsule Take 1 capsule (500 mg total) by mouth 4 (four) times daily. 07/23/14   Nat Christen, MD   BP 97/72 mmHg  Pulse 91  Temp(Src) 99.3 F (37.4 C) (Oral)  Resp 20  Ht '5\' 6"'$  (1.676 m)  Wt 120 lb (54.432 kg)  BMI 19.38 kg/m2  SpO2 94% Physical Exam  Constitutional: He is oriented to person, place, and time.  Thin, cachectic appearing  HENT:  Head: Normocephalic and atraumatic.  Eyes: Conjunctivae and EOM are normal. Pupils are equal, round, and reactive to light.  Neck: Normal range of motion. Neck supple.  Tracheostomy stoma in place  Cardiovascular: Normal rate and regular rhythm.   Pulmonary/Chest: Effort normal and breath sounds normal.  Abdominal: Soft. Bowel sounds are normal.  Musculoskeletal: Normal range of motion.  Neurological: He is alert and oriented to person, place, and time.  Skin: Skin is warm and dry.  Psychiatric: He has a normal mood and affect. His behavior is normal.  Nursing note and vitals reviewed.   ED Course  Procedures (including critical care time) Labs Review Labs Reviewed  CBC - Abnormal; Notable for the following:    RBC 3.62 (*)    Hemoglobin 10.4 (*)    HCT 32.1 (*)    All other components within normal limits  URINALYSIS, ROUTINE W REFLEX MICROSCOPIC (NOT AT [facility]) - Abnormal; Notable for the following:    Hgb urine dipstick LARGE (*)    Protein, ur 100 (*)    Leukocytes, UA SMALL (*)    All other components within normal limits  COMPREHENSIVE METABOLIC PANEL - Abnormal; Notable for the following:    Sodium 132 (*)    Creatinine, Ser 1.65 (*)    Calcium 8.0 (*)    Albumin 3.0 (*)    AST 10 (*)    ALT 7 (*)    Total Bilirubin 0.1 (*)    GFR calc non Af Amer 38 (*)    GFR calc Af Amer 44 (*)    All other components within normal limits  URINE MICROSCOPIC-ADD ON - Abnormal; Notable for the following:    Bacteria, UA MANY (*)    All other components  within normal limits  URINE CULTURE    Imaging Review Dg Chest 2 View  07/23/2014   CLINICAL DATA:  81 year old male with increased confusion, loss of appetite under history of CHF.  EXAM: CHEST  2 VIEW  COMPARISON:  Chest radiograph dated 06/13/2014  FINDINGS: Two views of the chest demonstrate emphysematous changes of the lungs. There has been interval improvement with near complete resolution of the previously seen left lower lobe consolidation. A small left pleural effusion is  noted. No pneumothorax. Right-sided Port-A-Cath with tip in stable position. There is osteopenia with degenerative changes of the spine. Prior left rib resection as well as left lobectomy surgical suture noted.  IMPRESSION: Emphysema with small left pleural effusion.  Near-complete resolution of the previously seen left lower lobe airspace opacity.  Postsurgical changes of left lower lobectomy and left rib in resection.   Electronically Signed   By: Anner Crete M.D.   On: 07/23/2014 21:22   Ct Head Wo Contrast  07/23/2014   CLINICAL DATA:  Altered mental status with increased confusion and loss of appetite since yesterday  EXAM: CT HEAD WITHOUT CONTRAST  TECHNIQUE: Contiguous axial images were obtained from the base of the skull through the vertex without intravenous contrast.  COMPARISON:  03/25/2014  FINDINGS: Skull and Sinuses:Metastasis in the right parietal bone is stable in size since prior. There has been interval ossification within the bony defect.  Bilateral chronic mastoiditis.  Mucous retention cysts or secretions in the right sphenoid. Doubt acute sinusitis.  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  Brain: No evidence of acute infarction, hemorrhage, hydrocephalus, or mass lesion/mass effect. Generalized atrophy. There is mild for age chronic small vessel disease with ischemic gliosis noted around the lateral ventricles.  IMPRESSION: 1. No acute findings. 2. Known right parietal calvarium metastasis. Interval internal  calcification suggests healing/positive treatment response.   Electronically Signed   By: Monte Fantasia M.D.   On: 07/23/2014 21:03     EKG Interpretation None      MDM   Final diagnoses:  UTI (lower urinary tract infection)  Metastatic cancer    Patient has known metastatic cancer. CT head shows no acute findings. Chest x-ray shows a small left pleural effusion but no obvious pneumonia. There may be minor evidence of a urinary tract infection. Will start Keflex and culture urine. Social work consult requested for home health care. All these findings were discussed with the patient and his family.    Nat Christen, MD 07/23/14 [id number]

## 2014-07-26 LAB — URINE CULTURE

## 2014-07-28 ENCOUNTER — Telehealth (HOSPITAL_COMMUNITY): Payer: Self-pay

## 2014-07-28 NOTE — Progress Notes (Signed)
ED Antimicrobial Stewardship Positive Culture Follow Up   Jeffrey [name] is an 81 y.o. male who presented to [facility] on 07/23/2014 with a chief complaint of  Chief Complaint  Patient presents with  . Altered Mental Status    Recent Results (from the past 720 hour(s))  Urine culture     Status: None   Collection Time: 07/23/14  8:00 PM  Result Value Ref Range Status   Specimen Description URINE, CLEAN CATCH  Final   Special Requests Immunocompromised  Final   Culture   Final    >=100,000 COLONIES/mL ESCHERICHIA COLI Confirmed Extended Spectrum Beta-Lactamase Producer (ESBL) Performed at [facility]    Report Status 07/26/2014 FINAL  Final   Organism ID, Bacteria ESCHERICHIA COLI  Final      Susceptibility   Escherichia coli - MIC*    AMPICILLIN >=32 RESISTANT Resistant     CEFAZOLIN >=64 RESISTANT Resistant     CEFTRIAXONE >=64 RESISTANT Resistant     CIPROFLOXACIN >=4 RESISTANT Resistant     GENTAMICIN <=1 SENSITIVE Sensitive     IMIPENEM <=0.25 SENSITIVE Sensitive     NITROFURANTOIN <=16 SENSITIVE Sensitive     TRIMETH/SULFA >=320 RESISTANT Resistant     AMPICILLIN/SULBACTAM >=32 RESISTANT Resistant     PIP/TAZO 8 SENSITIVE Sensitive     * >=100,000 COLONIES/mL ESCHERICHIA COLI    '[x]'$  Treated with cephalexin, organism resistant to prescribed antimicrobial '[]'$  Patient discharged originally without antimicrobial agent and treatment is now indicated  New antibiotic prescription: fosfomycin 3g po x 1 dose  ED Provider: Dahlia Bailiff, PA-C   Candie Mile 07/28/2014, 1:02 PM Infectious Diseases Pharmacist Phone# [phone]

## 2014-07-28 NOTE — Telephone Encounter (Signed)
Post ED Visit - Positive Culture Follow-up: Chart Hand-off to ED Flow Manager  Culture assessed and recommendations reviewed by: '[]'$  Levester Fresh, Pharm.D., BCPS '[x]'$  Heide Guile, Pharm.D., BCPS-AQ ID '[]'$  Alycia Rossetti, Pharm.D., BCPS '[]'$  [place], [place].D., BCPS, AAHIVP '[]'$  Legrand Como, Pharm .D., BCPS, AAHIVP '[]'$  Milus Glazier, Pharm.D.  Positive urine culture  '[]'$  Patient discharged without antimicrobial prescription and treatment is now indicated '[x]'$  Organism is resistant to prescribed ED discharge antimicrobial '[]'$  Patient with positive blood cultures  Changes discussed with ED provider: Dahlia Bailiff New antibiotic prescription stop cephalexin and start Fosfomycin 3 grams po x 1  Spoke with pts with. This was called to [facility] in [place]. Left on doctors voice mail.    Ileene Musa 07/28/2014, 3:25 PM

## 2014-08-04 ENCOUNTER — Other Ambulatory Visit: Payer: Self-pay | Admitting: Family Medicine

## 2014-08-04 NOTE — Telephone Encounter (Signed)
May give this and 3 refills

## 2014-08-05 ENCOUNTER — Encounter (HOSPITAL_COMMUNITY): Payer: Self-pay | Admitting: Oncology

## 2014-08-05 ENCOUNTER — Encounter (HOSPITAL_COMMUNITY): Payer: Managed Care, Other (non HMO) | Attending: Oncology | Admitting: Oncology

## 2014-08-05 ENCOUNTER — Encounter (HOSPITAL_COMMUNITY): Payer: Self-pay | Admitting: *Deleted

## 2014-08-05 VITALS — BP 81/51 | HR 92 | Temp 98.8°F | Resp 20 | Wt 115.7 lb

## 2014-08-05 DIAGNOSIS — R911 Solitary pulmonary nodule: Secondary | ICD-10-CM

## 2014-08-05 DIAGNOSIS — B370 Candidal stomatitis: Secondary | ICD-10-CM

## 2014-08-05 DIAGNOSIS — Z85118 Personal history of other malignant neoplasm of bronchus and lung: Secondary | ICD-10-CM

## 2014-08-05 DIAGNOSIS — C7951 Secondary malignant neoplasm of bone: Secondary | ICD-10-CM

## 2014-08-05 DIAGNOSIS — R634 Abnormal weight loss: Secondary | ICD-10-CM

## 2014-08-05 DIAGNOSIS — R918 Other nonspecific abnormal finding of lung field: Secondary | ICD-10-CM | POA: Insufficient documentation

## 2014-08-05 DIAGNOSIS — C801 Malignant (primary) neoplasm, unspecified: Secondary | ICD-10-CM | POA: Insufficient documentation

## 2014-08-05 DIAGNOSIS — C799 Secondary malignant neoplasm of unspecified site: Secondary | ICD-10-CM

## 2014-08-05 MED ORDER — FLUCONAZOLE 100 MG PO TABS: 100.0000 mg | ORAL_TABLET | Freq: Every day | ORAL | Status: AC

## 2014-08-05 MED ORDER — MEGESTROL ACETATE 40 MG/ML PO SUSP: 400.0000 mg | Freq: Every day | ORAL | Status: AC

## 2014-08-05 MED ORDER — FIRST-DUKES MOUTHWASH MT SUSP: 5.0000 mL | Freq: Four times a day (QID) | OROMUCOSAL | Status: AC | PRN

## 2014-08-05 MED ORDER — HYDROCODONE-ACETAMINOPHEN 10-325 MG PO TABS: 1.0000 | ORAL_TABLET | ORAL | Status: AC | PRN

## 2014-08-05 NOTE — Patient Instructions (Signed)
..  [place] at [facility] Discharge Instructions  RECOMMENDATIONS MADE BY THE CONSULTANT AND ANY TEST RESULTS WILL BE SENT TO YOUR REFERRING PHYSICIAN.  Return in 3 weeks  Thank you for choosing [place] [facility] to provide your oncology and hematology care.  To afford each patient quality time with our provider, please arrive at least 15 minutes before your scheduled appointment time.    You need to re-schedule your appointment should you arrive 10 or more minutes late.  We strive to give you quality time with our providers, and arriving late affects you and other patients whose appointments are after yours.  Also, if you no show three or more times for appointments you may be dismissed from the clinic at the providers discretion.     Again, thank you for choosing [facility].  Our hope is that these requests will decrease the amount of time that you wait before being seen by our physicians.       _____________________________________________________________  Should you have questions after your visit to [facility], please contact our office at [phone] between the hours of 8:30 a.m. and 4:30 p.m.  Voicemails left after 4:30 p.m. will not be returned until the following business day.  For prescription refill requests, have your pharmacy contact our office.

## 2014-08-05 NOTE — Progress Notes (Signed)
[name], MD [place] [address]  Metastatic cancer - Plan: HYDROcodone-acetaminophen (Arnot) 10-325 MG per tablet  History of lung cancer - Plan: HYDROcodone-acetaminophen (NORCO) 10-325 MG per tablet  Oral candidiasis - Plan: Diphenhyd-Hydrocort-Nystatin (FIRST-DUKES MOUTHWASH) SUSP, fluconazole (DIFLUCAN) 100 MG tablet  Weight loss - Plan: megestrol (MEGACE) 40 MG/ML suspension  CURRENT THERAPY: None  INTERVAL HISTORY: Jeffrey [name] 81 y.o. 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     Metastatic cancer   02/17/2011 Imaging PET scan- B/L hypermetabolic pulmonary nodules (3 in total) are most consistent with metastatic disease. No evidence of mediastinal nodal metastasis.  No evidence of metastasis in the abdomen or pelvis.   06/19/2012 Imaging CT scan of chest- Slight enlargement of pulmonary nodules/metastasis.    07/31/2012 - 11/12/2012 Chemotherapy Carboplatin/Paclitaxel x 6 cycles   11/26/2012 Imaging CT CAP- Similar size of a R apical pulm nodule.  Similar to improved left lower lobe nodularity, favored to be post infectious or inflammatory. Similar nodularity in the right middle lobe. No evidence of new or progressive disease. Similar borderline adeno    12/10/2012 - 05/07/2013 Chemotherapy Tarceva 150 mg daily   05/03/2013 Progression CT CAP- Interval increase in size of right upper lobe adjacent pulmonary nodules is highly concerning for progression of lung cancer recurrence.   07/31/2013 - 04/10/2014 Chemotherapy Nivolumab   10/21/2013 Imaging CT CAP- The dominant right apical mass is slightly increased in size from previous exam. The adjacent nodule is stable.   10/31/2013 Imaging CT of head- Intermediate density subdural collection/hematoma along the convexity on the right, maximal thickness 7 mm. 2 mm of right-to-left shift. No hyper acute hemorrhage.  Lytic lesion right  posterior frontal calvarium, new since the previous study.   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 Widespread underlying dural thickening and enhancement in the R hemisphere suspicious for meningeal involvement.   12/24/2013 Imaging CT CAP- No clear enlargement of right apical mass. Stable right middle lobe nodule. Mild mediastinal lymphadenopathy.   12/26/2013 Procedure Spinal CSF testing- negative for malignancy.   01/14/2014 Imaging Skeletal survey- Lytic lesion noted in the right frontoparietal region as noted on recent head CT of 10/31/2013. This is consist with metastatic disease. No other focal bony lesions noted.   03/25/2014 Imaging focal soft tissue mass 7.5 x 3.4 x 6.4 cm in size with associated small area of osseous destruction of the medial LEFT scapula. This most likely represents an osseous metastasis with a large extraosseous soft tissue component.   04/07/2014 Progression Soft Tissue Needle Core Biopsy, Left scapular and adjacent tissues - METASTATIC SQUAMOUS CELL CARCINOMA.   04/29/2014 - 05/16/2014 Radiation Therapy XRT with Dr. Lisbeth Renshaw x 14 fractions.   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  Recent ED visit noted.  We had a long discussion about goals of care.  His goal is to "not leave my wife."  Unfortunately, given his advanced malignancy and decline, his cancer will cause his demise in the not too distant future.  Therefore, this is an unrealistic goal.  He is not a candidate for future  systemic therapy.  I broached the topic of Hospice and provided information regarding next best standard of care, which is symptom management.  I reviewed the role of Hospice  in his care.  "If I go with hospice, will you promise to see me when I need you?"  The answer to that question was "absolutely!"  I broached the topic of code status.  He was not clear on his desires today because our conversation lead to his wife crying.  As a result, [name] decided to end our discussion.  Our talk today was meaningful in the sense that I pointed out some obvious things related to his decline that may not have been discussed since our last visit with other healthcare providers.    There has been discussion regarding surgical intervention with urology and I have discouraged this given his performance status decline and terminal dx.  Continued Foley care is more appropriate.  Past Medical History  Diagnosis Date  . Tracheostomy in place   . Cancer 2010    laryngeal  . Lung cancer 2010  . Pneumonia 2014  . Dysrhythmia     takes Coreg and Lisinopril daily  . Shortness of breath     with exertion  . Enlarged prostate     takes flomax  . Urinary urgency   . Gastric ulcer     hx of  . Hx of transfusion of packed red blood cells     no abnormal reaction noted by patient  . Umbilical hernia   . Metastatic cancer 07/24/2012  . Hyperlipidemia   . ED (erectile dysfunction)   . Insomnia   . CHF (congestive heart failure)   . 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     has No Known Allergies.  Current Outpatient Prescriptions on File Prior to Visit  Medication Sig Dispense Refill  . Calcium Carb-Cholecalciferol (CALCIUM-VITAMIN D3) 600-500 MG-UNIT CAPS Take 1 capsule by mouth 3 (three) times daily with meals. (Patient taking differently: Take 2 capsules by mouth 2 (two) times daily. ) 90 capsule 4  . carvedilol (COREG) 3.125 MG tablet Take 1 tablet (3.125 mg total) by mouth 2 (two) times daily with a meal. 180 tablet 0  . cyanocobalamin (,VITAMIN B-12,) 1000 MCG/ML injection Inject 1 mL (1,000 mcg total) into the muscle every 30 (thirty) days. 1 mL 0  . esomeprazole (NEXIUM) 20 MG capsule Take 20 mg by mouth daily as needed (indigestion).     [name] Kitchen levothyroxine (SYNTHROID) 50 MCG tablet Take 2 tablets (100 mcg total) by mouth daily before breakfast. 180 tablet 1  . LORazepam (ATIVAN) 1 MG tablet TAKE ONE TABLET BY MOUTH AT BEDTIME AS NEEDED FOR ANXIETY 30 tablet 3  . ondansetron (ZOFRAN) 8 MG tablet Take by mouth every 8 (eight) hours as needed for nausea or vomiting. Starting day of chemo as needed    .  terazosin (HYTRIN) 5 MG capsule Take 1 or 2 capsules at bedtime for urinary hesitancy. (Patient taking differently: Take 5 mg by mouth at bedtime. ) 60 capsule 6  . cephALEXin (KEFLEX) 500 MG capsule Take 1 capsule (500 mg total) by mouth 4 (four) times daily. (Patient not taking: Reported on 08/05/2014) 28 capsule 0  . [DISCONTINUED] calcium-vitamin D (OSCAL WITH D) 500-200 MG-UNIT per tablet Take 2 tablets by mouth daily with breakfast. 60 tablet 11   No current facility-administered medications on file prior to visit.    Past Surgical History  Procedure Laterality Date  . Tracheostomy  2010  . Lung removal, partial  2010    left side  . Layngectomy for  laryngeal cancer in2010; left upper lobectomy for lung cancer, 2010.    . Back surgery      thinks it was done in 2012  . Esophagogastroduodenoscopy    . Colonoscopy    . Portacath placement Bilateral 07/12/2012    Procedure: INSERTION PORT-A-CATH;  Surgeon: [name], MD;  Location: [facility] OR;  Service: Thoracic;  Laterality: Bilateral;    Denies any headaches, dizziness, double vision, fevers, chills, night sweats, nausea, vomiting, diarrhea, constipation, chest pain, heart palpitations, shortness of breath, blood in stool, black tarry stool, urinary pain, urinary burning, urinary frequency, hematuria.   PHYSICAL EXAMINATION  ECOG PERFORMANCE STATUS: 3 - Symptomatic, >50% confined to bed  Filed Vitals:   08/05/14 1132  BP: 81/51  Pulse: 92  Temp: 98.8 F (37.1 C)  Resp: 20    GENERAL:alert, cachectic, cooperative and accompanied by wife SKIN: skin color, texture, turgor are normal, no rashes or significant lesions HEAD: Normocephalic, No masses, lesions, tenderness or abnormalities EYES: normal, PERRLA, EOMI, Conjunctiva are pink and non-injected EARS: External ears normal OROPHARYNX:lips, buccal mucosa, and tongue normal, mucous membranes are moist, thrush and left lower gum with bone exposure  NECK: supple, trachea midline LYMPH:  not examined BREAST:not examined LUNGS: not examined HEART: not examined ABDOMEN:not examined BACK: Back symmetric, no curvature. EXTREMITIES:less then 2 second capillary refill, no joint deformities, effusion, or inflammation, no skin discoloration, no cyanosis  NEURO: alert & oriented x 3 with fluent speech, no focal motor/sensory deficits    LABORATORY DATA: CBC    Component Value Date/Time   WBC 9.3 07/23/2014 2006   RBC 3.62* 07/23/2014 2006   RBC 3.05* 05/21/2014 1254   HGB 10.4* 07/23/2014 2006   HCT 32.1* 07/23/2014 2006   PLT 327 07/23/2014 2006   MCV 88.7 07/23/2014 2006   MCH 28.7 07/23/2014 2006   MCHC 32.4 07/23/2014  2006   RDW 15.4 07/23/2014 2006   LYMPHSABS 0.6* 07/03/2014 1038   MONOABS 0.5 07/03/2014 1038   EOSABS 0.1 07/03/2014 1038   BASOSABS 0.0 07/03/2014 1038      Chemistry      Component Value Date/Time   NA 132* 07/23/2014 2006   K 4.8 07/23/2014 2006   CL 103 07/23/2014 2006   CO2 23 07/23/2014 2006   BUN 19 07/23/2014 2006   CREATININE 1.65* 07/23/2014 2006   CREATININE 1.46* 11/12/2012 0839      Component Value Date/Time   CALCIUM 8.0* 07/23/2014 2006   CALCIUM 8.5 01/26/2012 1312   ALKPHOS 59 07/23/2014 2006   AST 10* 07/23/2014 2006   ALT 7* 07/23/2014 2006   BILITOT 0.1* 07/23/2014 2006        PENDING LABS:   RADIOGRAPHIC STUDIES:  Dg Chest 2 View  07/23/2014   CLINICAL DATA:  81 year old male with increased confusion, loss of appetite under history of CHF.  EXAM: CHEST  2 VIEW  COMPARISON:  Chest radiograph dated 06/13/2014  FINDINGS: Two views of the chest demonstrate emphysematous changes of the lungs. There has been interval improvement with near complete resolution of the previously seen left lower lobe consolidation. A small left pleural effusion is noted. No pneumothorax. Right-sided Port-A-Cath with tip in stable position. There is osteopenia with degenerative changes of the spine. Prior left rib resection as well as left lobectomy surgical suture noted.  IMPRESSION: Emphysema with small left pleural effusion.  Near-complete resolution of the previously seen left lower lobe airspace opacity.  Postsurgical changes of left lower lobectomy and left rib in resection.   Electronically Signed   By: Anner Crete M.D.   On: 07/23/2014 21:22   Ct Head Wo Contrast  07/23/2014   CLINICAL DATA:  Altered mental status with increased confusion and loss of appetite since yesterday  EXAM: CT HEAD WITHOUT CONTRAST  TECHNIQUE: Contiguous axial images were obtained from the base of the skull through the vertex without intravenous contrast.  COMPARISON:  03/25/2014  FINDINGS: Skull  and Sinuses:Metastasis in the right parietal bone is stable in size since prior. There has been interval ossification within the bony defect.  Bilateral chronic mastoiditis.  Mucous retention cysts or secretions in the right sphenoid. Doubt acute sinusitis.  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  Brain: No evidence of acute infarction, hemorrhage, hydrocephalus, or mass lesion/mass effect. Generalized atrophy. There is mild for age chronic small vessel disease with ischemic gliosis noted around the lateral ventricles.  IMPRESSION: 1. No acute findings. 2. Known right parietal calvarium metastasis. Interval internal calcification suggests healing/positive treatment response.   Electronically Signed   By: Monte Fantasia M.D.   On: 07/23/2014 21:03     PATHOLOGY:    ASSESSMENT AND PLAN:  Metastatic cancer Progressive squamous cell carcinoma of head and neck with failure in therapy with Nivolumab.  S/P palliative radiation to left scapular lesion.  Long discussion regarding goals of care.  He has demonstrated a decline clinically.  His weight is down about 22 lbs since end of May.  Multiple ED visits and hospitalizations over the past few months.  He is not thriving.  He is no longer a candidate for systemic chemotherapy. We discussed Hospice and Code Status.  I was unable to achieve an approval for Hospice referral and a change in code status, but we did discuss these in detail.  Since he is not offered any treatment for his malignancy, I think he will strongly consider Hospice and DNR.  Emotions were noted from his wife with her tearing as a result of these conversation.  Izsak ended the discussion without any concrete decisions based upon his wife's emotions.  However, I think the patient and his wife will strongly think on these items with future decision making.  Rx for Magic Mouthwash with Lidocaine QID PRN.  Rx for Diflucan 100 mg daily x 10 days.  Rx for Hydrocodone #120  Rx for Megace for  appetite stimulation, although at this point, response is unlikely.  Return in 3 weeks for follow-up.  This appointment can be rescheduled.  He knows to call the clinic if he decides to pursue Hospice/DNR.  I have ordered Home Health for Foley care and Foley catheter exchange every month.  Future urologic intervention is likely futile given his terminal illness.    THERAPY PLAN:  Not a candidate for  systemic therapy.  He would benefit from Hospice referral.  All questions were answered. The patient knows to call the clinic with any problems, questions or concerns. We can certainly see the patient much sooner if necessary.  40 minutes was spent in face-to-face discussion with the patient.  More than 50% of the time spent with the patient was utilized for counseling and coordination of care.  Patient and plan discussed with Dr. Ancil Linsey and she is in agreement with the aforementioned.   This note is electronically signed by: Robynn Pane, PA-C 08/05/2014 1:10 PM

## 2014-08-05 NOTE — Assessment & Plan Note (Addendum)
Progressive squamous cell carcinoma of head and neck with failure in therapy with Nivolumab.  S/P palliative radiation to left scapular lesion.  Long discussion regarding goals of care.  He has demonstrated a decline clinically.  His weight is down about 22 lbs since end of May.  Multiple ED visits and hospitalizations over the past few months.  He is not thriving.  He is no longer a candidate for systemic chemotherapy. We discussed Hospice and Code Status.  I was unable to achieve an approval for Hospice referral and a change in code status, but we did discuss these in detail.  Since he is not offered any treatment for his malignancy, I think he will strongly consider Hospice and DNR.  Emotions were noted from his wife with her tearing as a result of these conversation.  Onofre ended the discussion without any concrete decisions based upon his wife's emotions.  However, I think the patient and his wife will strongly think on these items with future decision making.  Rx for Magic Mouthwash with Lidocaine QID PRN.  Rx for Diflucan 100 mg daily x 10 days.  Rx for Hydrocodone #120  Rx for Megace for appetite stimulation, although at this point, response is unlikely.  Return in 3 weeks for follow-up.  This appointment can be rescheduled.  He knows to call the clinic if he decides to pursue Hospice/DNR.  I have ordered Home Health for Foley care and Foley catheter exchange every month.  Future urologic intervention is likely futile given his terminal illness.

## 2014-08-12 ENCOUNTER — Telehealth: Payer: Self-pay | Admitting: *Deleted

## 2014-08-12 NOTE — Telephone Encounter (Signed)
Home health nurse called and stated that she spoke at length with patient about hospice care as recommended by oncology and patient and wife stated they will be calling hospice to set up an evaluatuion

## 2014-08-14 ENCOUNTER — Other Ambulatory Visit: Payer: Self-pay | Admitting: Family Medicine

## 2014-08-15 ENCOUNTER — Other Ambulatory Visit: Payer: Self-pay | Admitting: *Deleted

## 2014-08-15 MED ORDER — NITROFURANTOIN MONOHYD MACRO 100 MG PO CAPS: 100.0000 mg | ORAL_CAPSULE | Freq: Two times a day (BID) | ORAL | Status: AC

## 2014-08-20 ENCOUNTER — Other Ambulatory Visit: Payer: Self-pay

## 2014-08-20 MED ORDER — NITROFURANTOIN MONOHYD MACRO 100 MG PO CAPS: 100.0000 mg | ORAL_CAPSULE | Freq: Two times a day (BID) | ORAL | Status: AC

## 2014-08-22 DIAGNOSIS — C7951 Secondary malignant neoplasm of bone: Secondary | ICD-10-CM | POA: Diagnosis not present

## 2014-08-22 DIAGNOSIS — C760 Malignant neoplasm of head, face and neck: Secondary | ICD-10-CM

## 2014-08-22 DIAGNOSIS — Z466 Encounter for fitting and adjustment of urinary device: Secondary | ICD-10-CM | POA: Diagnosis not present

## 2014-08-22 DIAGNOSIS — N400 Enlarged prostate without lower urinary tract symptoms: Secondary | ICD-10-CM

## 2014-08-25 ENCOUNTER — Telehealth: Payer: Self-pay | Admitting: Family Medicine

## 2014-08-25 NOTE — Telephone Encounter (Signed)
A DNR form was dropped off to be signed and next week will be ok for this.

## 2014-08-27 ENCOUNTER — Ambulatory Visit (HOSPITAL_COMMUNITY): Payer: Medicare Other | Admitting: Oncology

## 2014-08-28 ENCOUNTER — Encounter: Payer: Self-pay | Admitting: Family Medicine

## 2014-08-31 NOTE — Telephone Encounter (Signed)
Dr. Richardson Landry to see

## 2014-09-01 ENCOUNTER — Other Ambulatory Visit (HOSPITAL_COMMUNITY): Payer: Self-pay | Admitting: Oncology

## 2014-09-01 ENCOUNTER — Other Ambulatory Visit (HOSPITAL_COMMUNITY): Payer: Self-pay | Admitting: Hematology & Oncology

## 2014-09-01 DIAGNOSIS — C799 Secondary malignant neoplasm of unspecified site: Secondary | ICD-10-CM

## 2014-09-01 MED ORDER — MORPHINE SULFATE (CONCENTRATE) 20 MG/ML PO SOLN: 10.0000 mg | ORAL | Status: AC | PRN

## 2014-09-02 ENCOUNTER — Ambulatory Visit: Payer: Medicare Other | Admitting: Family Medicine

## 2014-09-09 ENCOUNTER — Encounter: Payer: Self-pay | Admitting: Family Medicine

## 2014-09-18 DEATH — deceased

## 2015-07-08 ENCOUNTER — Other Ambulatory Visit: Payer: Self-pay | Admitting: Nurse Practitioner

## 2015-12-05 IMAGING — CR DG CHEST 1V PORT
2 series · 2 of 2 positions shown · non-contrast
Comparison: 05/20/2014

CLINICAL DATA: Hematuria

EXAM:
PORTABLE CHEST - 1 VIEW

[ap portable (1 of 2)]
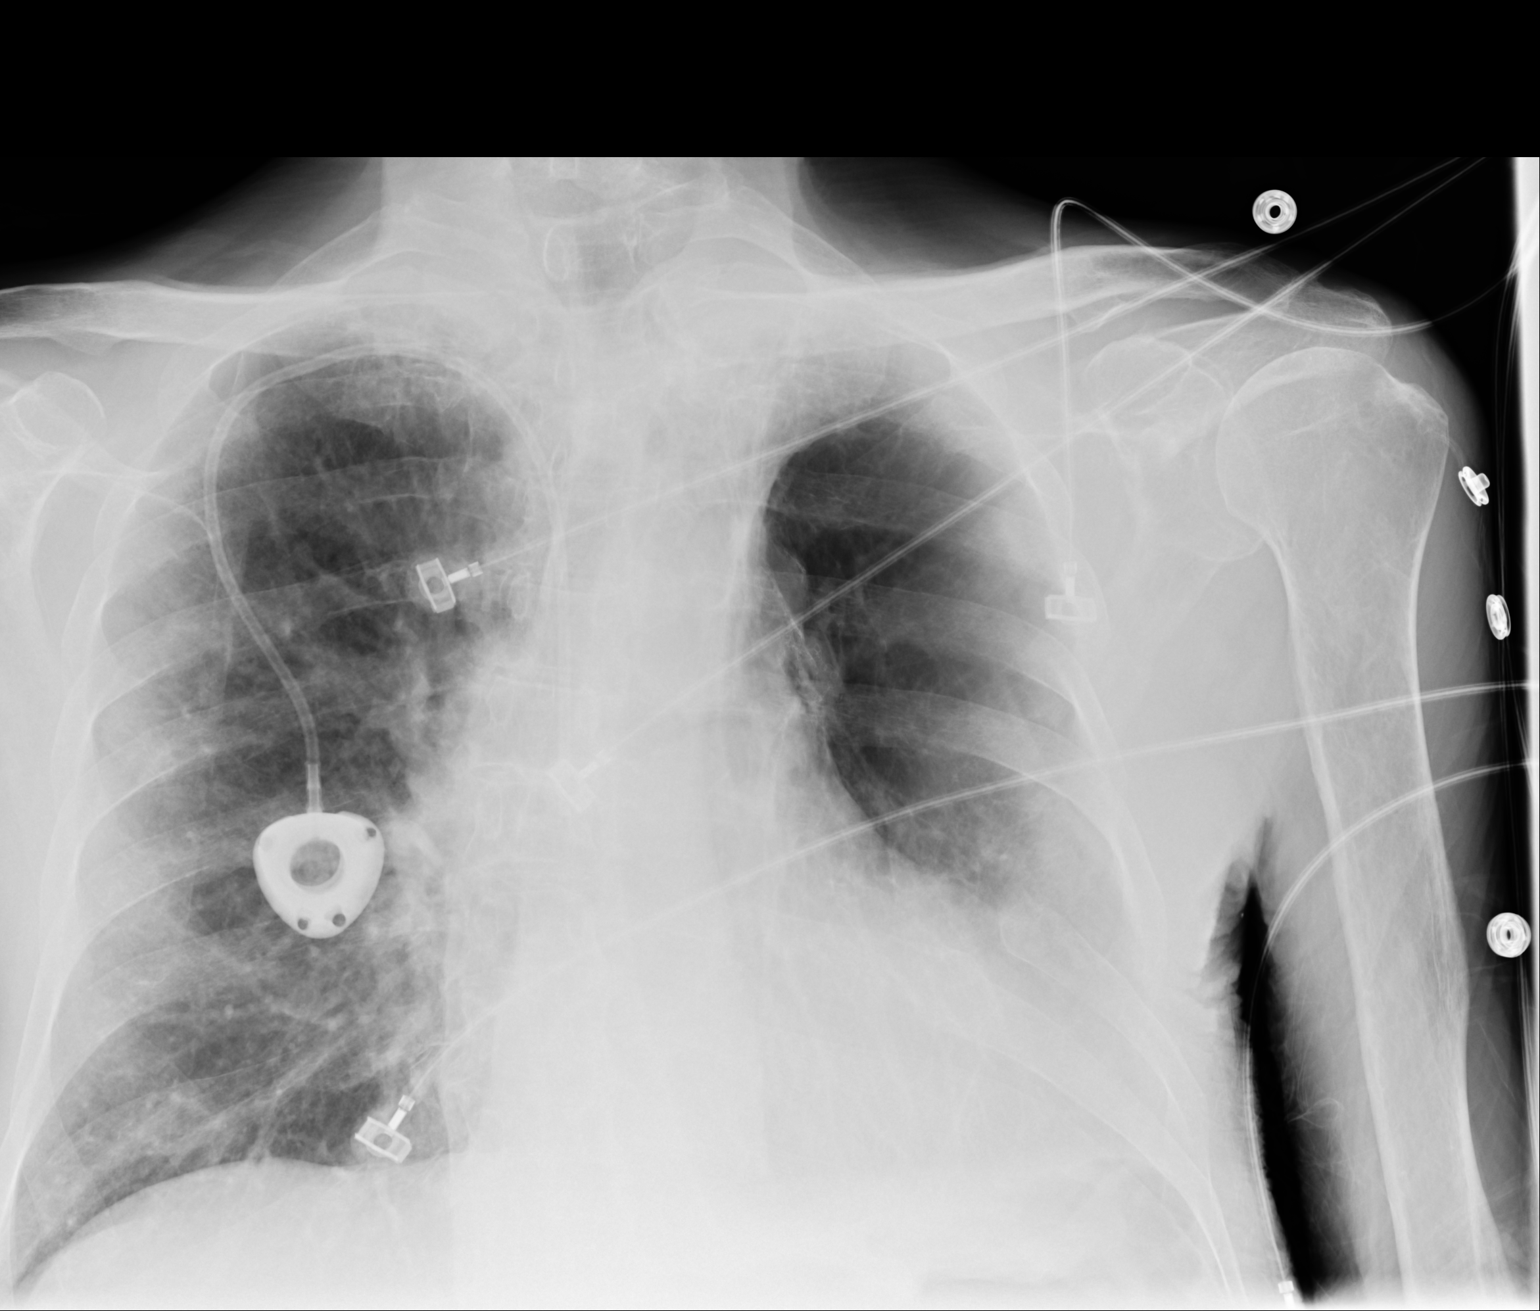

[ap portable (2 of 2)]
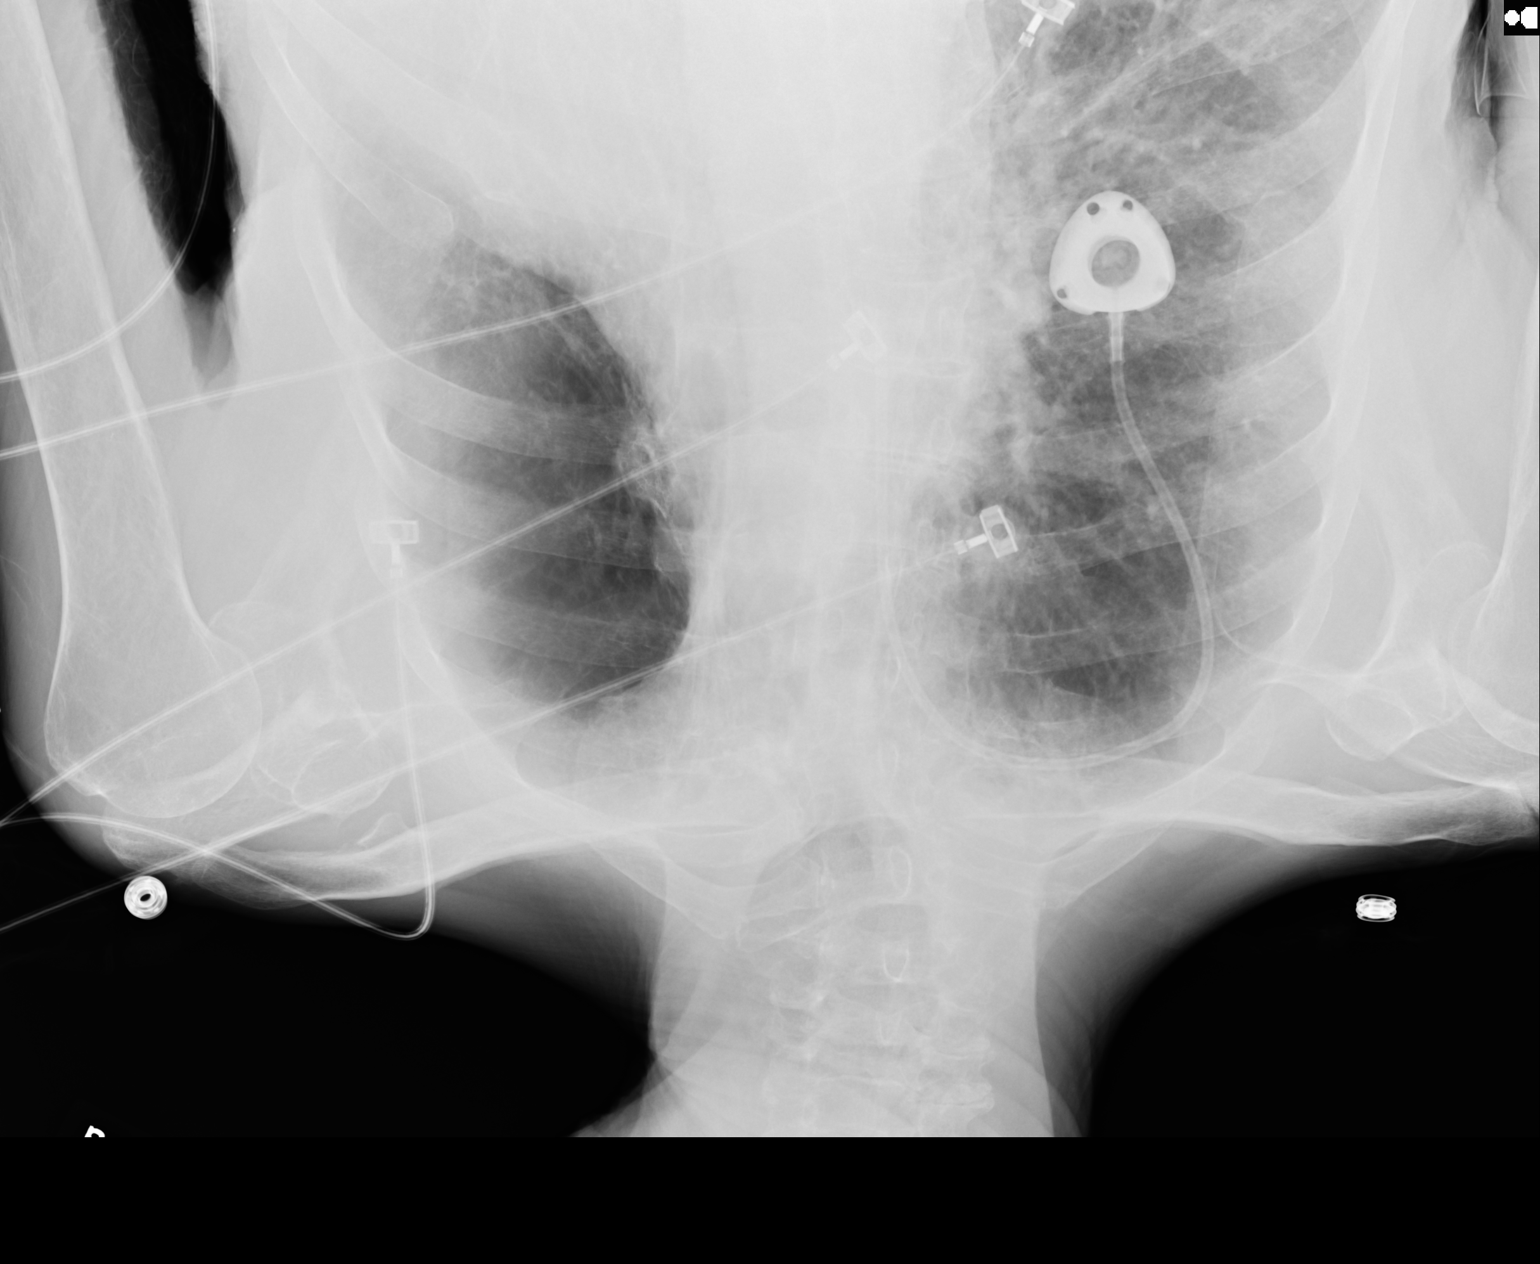

[2 of 2 positions shown; findings below may reference images not displayed]

FINDINGS: Cardiomediastinal silhouette is stable. Right subclavian Port-A-Cath
is unchanged in position. No acute infiltrate or pulmonary edema.
Old left rib fractures are again noted. Again noted chronic blunting
of the left costophrenic angle. Stable postsurgical changes post
left lower lobectomy.
IMPRESSION: No active disease. No significant change. Again noted chronic
blunting of left costophrenic angle and status post left lower
lobectomy. Old left rib fractures. Stable right subclavian
Port-A-Cath position.
